# Patient Record
Sex: Female | Born: 1935 | Race: White | Hispanic: No | Marital: Married | Smoking: Never smoker
Health system: Southern US, Community
[De-identification: ages and names within clinical notes are randomized; demographics above are authoritative.]

## PROBLEM LIST (undated history)

## (undated) DIAGNOSIS — R569 Unspecified convulsions: Secondary | ICD-10-CM

## (undated) DIAGNOSIS — I495 Sick sinus syndrome: Secondary | ICD-10-CM

## (undated) DIAGNOSIS — I469 Cardiac arrest, cause unspecified: Secondary | ICD-10-CM

## (undated) DIAGNOSIS — I350 Nonrheumatic aortic (valve) stenosis: Secondary | ICD-10-CM

## (undated) DIAGNOSIS — M1990 Unspecified osteoarthritis, unspecified site: Secondary | ICD-10-CM

## (undated) DIAGNOSIS — J387 Other diseases of larynx: Secondary | ICD-10-CM

## (undated) DIAGNOSIS — J90 Pleural effusion, not elsewhere classified: Secondary | ICD-10-CM

## (undated) DIAGNOSIS — E039 Hypothyroidism, unspecified: Secondary | ICD-10-CM

## (undated) DIAGNOSIS — I2510 Atherosclerotic heart disease of native coronary artery without angina pectoris: Secondary | ICD-10-CM

## (undated) DIAGNOSIS — Z9109 Other allergy status, other than to drugs and biological substances: Secondary | ICD-10-CM

## (undated) DIAGNOSIS — T300 Burn of unspecified body region, unspecified degree: Secondary | ICD-10-CM

## (undated) DIAGNOSIS — R918 Other nonspecific abnormal finding of lung field: Secondary | ICD-10-CM

## (undated) DIAGNOSIS — K759 Inflammatory liver disease, unspecified: Secondary | ICD-10-CM

## (undated) DIAGNOSIS — Z87442 Personal history of urinary calculi: Secondary | ICD-10-CM

## (undated) DIAGNOSIS — L80 Vitiligo: Secondary | ICD-10-CM

## (undated) DIAGNOSIS — S065X9A Traumatic subdural hemorrhage with loss of consciousness of unspecified duration, initial encounter: Secondary | ICD-10-CM

## (undated) DIAGNOSIS — I10 Essential (primary) hypertension: Secondary | ICD-10-CM

## (undated) DIAGNOSIS — H3530 Unspecified macular degeneration: Secondary | ICD-10-CM

## (undated) DIAGNOSIS — T4145XA Adverse effect of unspecified anesthetic, initial encounter: Secondary | ICD-10-CM

## (undated) DIAGNOSIS — S065XAA Traumatic subdural hemorrhage with loss of consciousness status unknown, initial encounter: Secondary | ICD-10-CM

## (undated) DIAGNOSIS — T884XXA Failed or difficult intubation, initial encounter: Secondary | ICD-10-CM

## (undated) DIAGNOSIS — T8859XA Other complications of anesthesia, initial encounter: Secondary | ICD-10-CM

## (undated) DIAGNOSIS — Z972 Presence of dental prosthetic device (complete) (partial): Secondary | ICD-10-CM

## (undated) DIAGNOSIS — Z9889 Other specified postprocedural states: Secondary | ICD-10-CM

## (undated) DIAGNOSIS — I5032 Chronic diastolic (congestive) heart failure: Secondary | ICD-10-CM

## (undated) DIAGNOSIS — I4510 Unspecified right bundle-branch block: Secondary | ICD-10-CM

## (undated) DIAGNOSIS — E119 Type 2 diabetes mellitus without complications: Secondary | ICD-10-CM

## (undated) DIAGNOSIS — E7800 Pure hypercholesterolemia, unspecified: Secondary | ICD-10-CM

## (undated) DIAGNOSIS — Z952 Presence of prosthetic heart valve: Secondary | ICD-10-CM

## (undated) DIAGNOSIS — J3801 Paralysis of vocal cords and larynx, unilateral: Secondary | ICD-10-CM

## (undated) DIAGNOSIS — K754 Autoimmune hepatitis: Secondary | ICD-10-CM

## (undated) DIAGNOSIS — Z9981 Dependence on supplemental oxygen: Secondary | ICD-10-CM

## (undated) DIAGNOSIS — Z973 Presence of spectacles and contact lenses: Secondary | ICD-10-CM

## (undated) DIAGNOSIS — R011 Cardiac murmur, unspecified: Secondary | ICD-10-CM

## (undated) DIAGNOSIS — R0602 Shortness of breath: Secondary | ICD-10-CM

## (undated) DIAGNOSIS — C801 Malignant (primary) neoplasm, unspecified: Secondary | ICD-10-CM

## (undated) DIAGNOSIS — I493 Ventricular premature depolarization: Secondary | ICD-10-CM

## (undated) DIAGNOSIS — K589 Irritable bowel syndrome without diarrhea: Secondary | ICD-10-CM

## (undated) DIAGNOSIS — F419 Anxiety disorder, unspecified: Secondary | ICD-10-CM

## (undated) DIAGNOSIS — Z8719 Personal history of other diseases of the digestive system: Secondary | ICD-10-CM

## (undated) DIAGNOSIS — K219 Gastro-esophageal reflux disease without esophagitis: Secondary | ICD-10-CM

## (undated) DIAGNOSIS — J189 Pneumonia, unspecified organism: Secondary | ICD-10-CM

## (undated) HISTORY — PX: EYE SURGERY: SHX253

## (undated) HISTORY — PX: CATARACT EXTRACTION W/ INTRAOCULAR LENS  IMPLANT, BILATERAL: SHX1307

## (undated) HISTORY — PX: ELBOW BURSA SURGERY: SHX615

## (undated) HISTORY — PX: OTHER SURGICAL HISTORY: SHX169

## (undated) HISTORY — DX: Type 2 diabetes mellitus without complications: E11.9

## (undated) HISTORY — DX: Essential (primary) hypertension: I10

## (undated) HISTORY — PX: JOINT REPLACEMENT: SHX530

## (undated) HISTORY — DX: Unspecified osteoarthritis, unspecified site: M19.90

## (undated) HISTORY — DX: Other specified postprocedural states: Z98.890

## (undated) HISTORY — PX: APPENDECTOMY: SHX54

## (undated) HISTORY — DX: Paralysis of vocal cords and larynx, unilateral: J38.01

## (undated) HISTORY — DX: Personal history of other diseases of the digestive system: Z87.19

## (undated) HISTORY — PX: ABDOMINAL HYSTERECTOMY: SHX81

---
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
## 1997-12-26 ENCOUNTER — Ambulatory Visit: Admission: RE | Admit: 1997-12-26 | Discharge: 1997-12-26 | Payer: Self-pay | Admitting: Internal Medicine

## 1998-02-17 ENCOUNTER — Inpatient Hospital Stay (HOSPITAL_COMMUNITY): Admission: EM | Admit: 1998-02-17 | Discharge: 1998-02-21 | Payer: Self-pay | Admitting: Emergency Medicine

## 1998-03-01 ENCOUNTER — Encounter: Admission: RE | Admit: 1998-03-01 | Discharge: 1998-05-30 | Payer: Self-pay | Admitting: Family Medicine

## 1998-11-05 ENCOUNTER — Ambulatory Visit (HOSPITAL_COMMUNITY): Admission: RE | Admit: 1998-11-05 | Discharge: 1998-11-05 | Payer: Self-pay | Admitting: Internal Medicine

## 1999-08-12 ENCOUNTER — Encounter: Payer: Self-pay | Admitting: Family Medicine

## 1999-08-12 ENCOUNTER — Encounter: Admission: RE | Admit: 1999-08-12 | Discharge: 1999-08-12 | Payer: Self-pay | Admitting: Family Medicine

## 2000-06-14 ENCOUNTER — Encounter: Payer: Self-pay | Admitting: Family Medicine

## 2000-06-14 ENCOUNTER — Encounter: Admission: RE | Admit: 2000-06-14 | Discharge: 2000-06-14 | Payer: Self-pay | Admitting: Family Medicine

## 2000-08-12 ENCOUNTER — Encounter: Payer: Self-pay | Admitting: Family Medicine

## 2000-08-12 ENCOUNTER — Encounter: Admission: RE | Admit: 2000-08-12 | Discharge: 2000-08-12 | Payer: Self-pay | Admitting: Family Medicine

## 2000-08-25 ENCOUNTER — Ambulatory Visit (HOSPITAL_COMMUNITY): Admission: RE | Admit: 2000-08-25 | Discharge: 2000-08-25 | Payer: Self-pay | Admitting: Interventional Cardiology

## 2001-02-28 ENCOUNTER — Inpatient Hospital Stay (HOSPITAL_COMMUNITY): Admission: RE | Admit: 2001-02-28 | Discharge: 2001-03-01 | Payer: Self-pay | Admitting: Orthopedic Surgery

## 2001-02-28 ENCOUNTER — Encounter: Payer: Self-pay | Admitting: Orthopedic Surgery

## 2001-03-13 ENCOUNTER — Emergency Department (HOSPITAL_COMMUNITY): Admission: EM | Admit: 2001-03-13 | Discharge: 2001-03-13 | Payer: Self-pay | Admitting: Emergency Medicine

## 2001-03-14 ENCOUNTER — Encounter: Admission: RE | Admit: 2001-03-14 | Discharge: 2001-06-12 | Payer: Self-pay | Admitting: Orthopedic Surgery

## 2001-06-13 ENCOUNTER — Encounter: Admission: RE | Admit: 2001-06-13 | Discharge: 2001-08-30 | Payer: Self-pay | Admitting: Orthopedic Surgery

## 2001-07-07 ENCOUNTER — Ambulatory Visit (HOSPITAL_BASED_OUTPATIENT_CLINIC_OR_DEPARTMENT_OTHER): Admission: RE | Admit: 2001-07-07 | Discharge: 2001-07-07 | Payer: Self-pay | Admitting: Orthopedic Surgery

## 2001-09-06 ENCOUNTER — Ambulatory Visit (HOSPITAL_COMMUNITY): Admission: RE | Admit: 2001-09-06 | Discharge: 2001-09-06 | Payer: Self-pay | Admitting: Internal Medicine

## 2001-09-30 ENCOUNTER — Encounter: Admission: RE | Admit: 2001-09-30 | Discharge: 2001-09-30 | Payer: Self-pay | Admitting: Family Medicine

## 2001-09-30 ENCOUNTER — Encounter: Payer: Self-pay | Admitting: Family Medicine

## 2002-06-16 ENCOUNTER — Other Ambulatory Visit: Admission: RE | Admit: 2002-06-16 | Discharge: 2002-06-16 | Payer: Self-pay

## 2002-11-07 ENCOUNTER — Encounter: Admission: RE | Admit: 2002-11-07 | Discharge: 2002-11-07 | Payer: Self-pay | Admitting: Family Medicine

## 2002-11-07 ENCOUNTER — Encounter: Payer: Self-pay | Admitting: Family Medicine

## 2003-01-02 ENCOUNTER — Encounter: Admission: RE | Admit: 2003-01-02 | Discharge: 2003-04-02 | Payer: Self-pay | Admitting: Family Medicine

## 2003-04-13 ENCOUNTER — Emergency Department (HOSPITAL_COMMUNITY): Admission: EM | Admit: 2003-04-13 | Discharge: 2003-04-13 | Payer: Self-pay | Admitting: *Deleted

## 2003-04-13 ENCOUNTER — Encounter: Payer: Self-pay | Admitting: Emergency Medicine

## 2004-02-13 ENCOUNTER — Encounter: Admission: RE | Admit: 2004-02-13 | Discharge: 2004-02-13 | Payer: Self-pay | Admitting: Family Medicine

## 2004-06-02 ENCOUNTER — Encounter: Admission: RE | Admit: 2004-06-02 | Discharge: 2004-06-02 | Payer: Self-pay | Admitting: Family Medicine

## 2004-06-09 ENCOUNTER — Encounter: Admission: RE | Admit: 2004-06-09 | Discharge: 2004-06-09 | Payer: Self-pay | Admitting: Family Medicine

## 2004-07-29 ENCOUNTER — Encounter: Admission: RE | Admit: 2004-07-29 | Discharge: 2004-07-29 | Payer: Self-pay | Admitting: Otolaryngology

## 2004-09-01 ENCOUNTER — Encounter: Admission: RE | Admit: 2004-09-01 | Discharge: 2004-09-01 | Payer: Self-pay | Admitting: Otolaryngology

## 2004-09-02 ENCOUNTER — Encounter: Admission: RE | Admit: 2004-09-02 | Discharge: 2004-09-02 | Payer: Self-pay | Admitting: Orthopedic Surgery

## 2005-03-02 ENCOUNTER — Encounter: Admission: RE | Admit: 2005-03-02 | Discharge: 2005-03-02 | Payer: Self-pay | Admitting: Family Medicine

## 2005-05-07 ENCOUNTER — Encounter: Admission: RE | Admit: 2005-05-07 | Discharge: 2005-05-07 | Payer: Self-pay | Admitting: Family Medicine

## 2005-07-16 ENCOUNTER — Encounter: Admission: RE | Admit: 2005-07-16 | Discharge: 2005-07-16 | Payer: Self-pay | Admitting: Sports Medicine

## 2005-10-14 ENCOUNTER — Ambulatory Visit: Payer: Self-pay | Admitting: Internal Medicine

## 2005-11-02 ENCOUNTER — Encounter (INDEPENDENT_AMBULATORY_CARE_PROVIDER_SITE_OTHER): Payer: Self-pay | Admitting: *Deleted

## 2005-11-02 ENCOUNTER — Ambulatory Visit (HOSPITAL_COMMUNITY): Admission: RE | Admit: 2005-11-02 | Discharge: 2005-11-02 | Payer: Self-pay | Admitting: Cardiology

## 2005-11-04 ENCOUNTER — Ambulatory Visit: Payer: Self-pay | Admitting: Internal Medicine

## 2005-11-04 ENCOUNTER — Ambulatory Visit (HOSPITAL_COMMUNITY): Admission: RE | Admit: 2005-11-04 | Discharge: 2005-11-04 | Payer: Self-pay | Admitting: Internal Medicine

## 2005-11-19 ENCOUNTER — Emergency Department (HOSPITAL_COMMUNITY): Admission: EM | Admit: 2005-11-19 | Discharge: 2005-11-19 | Payer: Self-pay | Admitting: Emergency Medicine

## 2005-12-18 ENCOUNTER — Ambulatory Visit: Payer: Self-pay | Admitting: Internal Medicine

## 2005-12-21 ENCOUNTER — Ambulatory Visit: Payer: Self-pay | Admitting: Internal Medicine

## 2006-03-18 ENCOUNTER — Encounter: Admission: RE | Admit: 2006-03-18 | Discharge: 2006-03-18 | Payer: Self-pay | Admitting: Family Medicine

## 2006-09-14 ENCOUNTER — Ambulatory Visit: Payer: Self-pay | Admitting: Internal Medicine

## 2006-09-27 ENCOUNTER — Encounter (INDEPENDENT_AMBULATORY_CARE_PROVIDER_SITE_OTHER): Payer: Self-pay | Admitting: *Deleted

## 2006-09-27 ENCOUNTER — Ambulatory Visit: Payer: Self-pay | Admitting: Internal Medicine

## 2006-09-29 ENCOUNTER — Ambulatory Visit (HOSPITAL_COMMUNITY): Admission: RE | Admit: 2006-09-29 | Discharge: 2006-09-29 | Payer: Self-pay | Admitting: Internal Medicine

## 2007-03-23 ENCOUNTER — Encounter: Admission: RE | Admit: 2007-03-23 | Discharge: 2007-03-23 | Payer: Self-pay | Admitting: Family Medicine

## 2007-07-11 ENCOUNTER — Encounter: Admission: RE | Admit: 2007-07-11 | Discharge: 2007-07-11 | Payer: Self-pay | Admitting: Family Medicine

## 2007-07-22 ENCOUNTER — Encounter: Admission: RE | Admit: 2007-07-22 | Discharge: 2007-07-22 | Payer: Self-pay | Admitting: Family Medicine

## 2007-08-01 ENCOUNTER — Emergency Department (HOSPITAL_COMMUNITY): Admission: EM | Admit: 2007-08-01 | Discharge: 2007-08-01 | Payer: Self-pay | Admitting: Emergency Medicine

## 2008-03-23 ENCOUNTER — Encounter: Admission: RE | Admit: 2008-03-23 | Discharge: 2008-03-23 | Payer: Self-pay | Admitting: Family Medicine

## 2008-03-27 ENCOUNTER — Encounter: Admission: RE | Admit: 2008-03-27 | Discharge: 2008-03-27 | Payer: Self-pay | Admitting: Internal Medicine

## 2008-04-11 ENCOUNTER — Other Ambulatory Visit: Admission: RE | Admit: 2008-04-11 | Discharge: 2008-04-11 | Payer: Self-pay | Admitting: Interventional Radiology

## 2008-04-11 ENCOUNTER — Encounter (INDEPENDENT_AMBULATORY_CARE_PROVIDER_SITE_OTHER): Payer: Self-pay | Admitting: Interventional Radiology

## 2008-04-11 ENCOUNTER — Encounter: Admission: RE | Admit: 2008-04-11 | Discharge: 2008-04-11 | Payer: Self-pay | Admitting: Internal Medicine

## 2008-05-14 ENCOUNTER — Ambulatory Visit (HOSPITAL_COMMUNITY): Admission: RE | Admit: 2008-05-14 | Discharge: 2008-05-14 | Payer: Self-pay | Admitting: Sports Medicine

## 2008-06-18 ENCOUNTER — Encounter (HOSPITAL_COMMUNITY): Admission: RE | Admit: 2008-06-18 | Discharge: 2008-08-13 | Payer: Self-pay | Admitting: Internal Medicine

## 2008-09-25 ENCOUNTER — Encounter: Admission: RE | Admit: 2008-09-25 | Discharge: 2008-09-25 | Payer: Self-pay | Admitting: Internal Medicine

## 2008-10-03 ENCOUNTER — Encounter: Admission: RE | Admit: 2008-10-03 | Discharge: 2008-10-03 | Payer: Self-pay | Admitting: Orthopedic Surgery

## 2008-10-04 ENCOUNTER — Ambulatory Visit (HOSPITAL_BASED_OUTPATIENT_CLINIC_OR_DEPARTMENT_OTHER): Admission: RE | Admit: 2008-10-04 | Discharge: 2008-10-04 | Payer: Self-pay | Admitting: Orthopedic Surgery

## 2008-10-04 ENCOUNTER — Encounter (INDEPENDENT_AMBULATORY_CARE_PROVIDER_SITE_OTHER): Payer: Self-pay | Admitting: Orthopedic Surgery

## 2008-10-11 ENCOUNTER — Encounter: Admission: RE | Admit: 2008-10-11 | Discharge: 2008-10-11 | Payer: Self-pay | Admitting: Family Medicine

## 2008-11-15 HISTORY — PX: THYROIDECTOMY: SHX17

## 2008-12-10 ENCOUNTER — Encounter (INDEPENDENT_AMBULATORY_CARE_PROVIDER_SITE_OTHER): Payer: Self-pay | Admitting: Surgery

## 2008-12-10 ENCOUNTER — Ambulatory Visit (HOSPITAL_COMMUNITY): Admission: RE | Admit: 2008-12-10 | Discharge: 2008-12-11 | Payer: Self-pay | Admitting: Surgery

## 2009-03-27 ENCOUNTER — Encounter: Admission: RE | Admit: 2009-03-27 | Discharge: 2009-03-27 | Payer: Self-pay | Admitting: Family Medicine

## 2009-05-02 ENCOUNTER — Emergency Department (HOSPITAL_COMMUNITY): Admission: EM | Admit: 2009-05-02 | Discharge: 2009-05-02 | Payer: Self-pay | Admitting: Emergency Medicine

## 2009-08-17 HISTORY — PX: LARYNGOPLASTY: SHX282

## 2009-12-16 ENCOUNTER — Telehealth: Payer: Self-pay | Admitting: Internal Medicine

## 2010-04-01 ENCOUNTER — Encounter: Admission: RE | Admit: 2010-04-01 | Discharge: 2010-04-01 | Payer: Self-pay | Admitting: Family Medicine

## 2010-09-07 ENCOUNTER — Encounter: Payer: Self-pay | Admitting: Family Medicine

## 2010-09-16 NOTE — Progress Notes (Signed)
Summary: Schedule EGD  Phone Note Outgoing Call Call back at [facility] Phone [phone]   Call placed by: Harlow Mares CMA Duncan Dull),  Dec 16, 2009 12:03 PM Call placed to: Patient Summary of Call: advised pt she needs an EGD for her Barretts she said she was going to a specialist for that now and she will talk with them. I asked if she was getting her procedures from them she said she was, so she changed care.  Initial call taken by: Harlow Mares CMA (AAMA),  Dec 16, 2009 12:05 PM

## 2010-11-26 LAB — URINALYSIS, ROUTINE W REFLEX MICROSCOPIC
Bilirubin Urine: NEGATIVE
Glucose, UA: NEGATIVE mg/dL
Hgb urine dipstick: NEGATIVE
Ketones, ur: NEGATIVE mg/dL
Nitrite: NEGATIVE
Protein, ur: NEGATIVE mg/dL
Specific Gravity, Urine: 1.005 (ref 1.005–1.030)
Urobilinogen, UA: 0.2 mg/dL (ref 0.0–1.0)
pH: 6 (ref 5.0–8.0)

## 2010-11-26 LAB — CBC
HCT: 39.6 % (ref 36.0–46.0)
Hemoglobin: 13.3 g/dL (ref 12.0–15.0)
MCHC: 33.5 g/dL (ref 30.0–36.0)
MCV: 88.4 fL (ref 78.0–100.0)
Platelets: 147 10*3/uL — ABNORMAL LOW (ref 150–400)
RBC: 4.48 MIL/uL (ref 3.87–5.11)
RDW: 14.7 % (ref 11.5–15.5)
WBC: 3.9 10*3/uL — ABNORMAL LOW (ref 4.0–10.5)

## 2010-11-26 LAB — GLUCOSE, CAPILLARY
Glucose-Capillary: 142 mg/dL — ABNORMAL HIGH (ref 70–99)
Glucose-Capillary: 150 mg/dL — ABNORMAL HIGH (ref 70–99)
Glucose-Capillary: 167 mg/dL — ABNORMAL HIGH (ref 70–99)
Glucose-Capillary: 176 mg/dL — ABNORMAL HIGH (ref 70–99)
Glucose-Capillary: 177 mg/dL — ABNORMAL HIGH (ref 70–99)
Glucose-Capillary: 182 mg/dL — ABNORMAL HIGH (ref 70–99)
Glucose-Capillary: 184 mg/dL — ABNORMAL HIGH (ref 70–99)
Glucose-Capillary: 201 mg/dL — ABNORMAL HIGH (ref 70–99)

## 2010-11-26 LAB — PROTIME-INR
INR: 1 (ref 0.00–1.49)
Prothrombin Time: 13.5 seconds (ref 11.6–15.2)

## 2010-11-26 LAB — BASIC METABOLIC PANEL
BUN: 13 mg/dL (ref 6–23)
CO2: 29 mEq/L (ref 19–32)
Calcium: 10.5 mg/dL (ref 8.4–10.5)
Chloride: 99 mEq/L (ref 96–112)
Creatinine, Ser: 0.6 mg/dL (ref 0.4–1.2)
GFR calc Af Amer: >60 mL/min (ref >60)
GFR calc non Af Amer: >60 mL/min (ref >60)
Glucose, Bld: 133 mg/dL — ABNORMAL HIGH (ref 70–99)
Potassium: 4.7 mEq/L (ref 3.5–5.1)
Sodium: 136 mEq/L (ref 135–145)

## 2010-11-26 LAB — DIFFERENTIAL
Basophils Absolute: 0 10*3/uL (ref 0.0–0.1)
Basophils Relative: 1 % (ref 0–1)
Eosinophils Absolute: 0.1 10*3/uL (ref 0.0–0.7)
Eosinophils Relative: 2 % (ref 0–5)
Lymphocytes Relative: 27 % (ref 12–46)
Lymphs Abs: 1.1 10*3/uL (ref 0.7–4.0)
Monocytes Absolute: 0.4 10*3/uL (ref 0.1–1.0)
Monocytes Relative: 11 % (ref 3–12)
Neutro Abs: 2.3 10*3/uL (ref 1.7–7.7)
Neutrophils Relative %: 59 % (ref 43–77)

## 2010-11-26 LAB — CALCIUM
Calcium: 9.4 mg/dL (ref 8.4–10.5)
Calcium: 9.4 mg/dL (ref 8.4–10.5)

## 2010-12-02 LAB — GLUCOSE, CAPILLARY
Glucose-Capillary: 152 mg/dL — ABNORMAL HIGH (ref 70–99)
Glucose-Capillary: 243 mg/dL — ABNORMAL HIGH (ref 70–99)

## 2010-12-02 LAB — BASIC METABOLIC PANEL
BUN: 11 mg/dL (ref 6–23)
CO2: 28 mEq/L (ref 19–32)
Calcium: 10 mg/dL (ref 8.4–10.5)
Chloride: 100 mEq/L (ref 96–112)
Creatinine, Ser: 0.61 mg/dL (ref 0.4–1.2)
GFR calc Af Amer: >60 mL/min (ref >60)
GFR calc non Af Amer: >60 mL/min (ref >60)
Glucose, Bld: 123 mg/dL — ABNORMAL HIGH (ref 70–99)
Potassium: 4.1 mEq/L (ref 3.5–5.1)
Sodium: 136 mEq/L (ref 135–145)

## 2010-12-02 LAB — POCT HEMOGLOBIN-HEMACUE: Hemoglobin: 12.6 g/dL (ref 12.0–15.0)

## 2010-12-30 NOTE — Op Note (Signed)
Hailey Cobb, [name] NO.:  [id number]   MEDICAL RECORD NO.:  [id number]          PATIENT TYPE:  AMB   LOCATION:  DAY                          FACILITY:  [facility]   PHYSICIAN:  Velora Heckler, MD      DATE OF BIRTH:  27-Feb-1932   DATE OF PROCEDURE:  12/10/2008  DATE OF DISCHARGE:                               OPERATIVE REPORT   PREOPERATIVE DIAGNOSIS:  Bilateral thyroid nodules.   POSTOPERATIVE DIAGNOSIS:  Bilateral thyroid nodules.   PROCEDURE:  Total thyroidectomy.   SURGEON:  Velora Heckler, M.D., FACS   ASSISTANT:  Consuello Bossier, M.D., FACS   ANESTHESIA:  General per Dr. Brayton Caves.   ESTIMATED BLOOD LOSS:  Minimal.   PREPARATION:  ChloraPrep.   COMPLICATIONS:  None.   INDICATIONS:  The patient is a 77 year old white female from [place], [place].  She has been followed for bilateral thyroid  nodules by Dr. Talmage Cobb at [facility].  She has a 7-mm nodule in the  inferior left lobe with irregular margins and microcalcifications.  Cytopathology has been benign.  The patient was tried on thyroid hormone  suppression which was poorly tolerated.  She is now referred for  consideration of thyroidectomy.   BODY OF REPORT:  Procedure was done in OR #11 at the [facility].  The patient was brought to the operating room,  placed in a supine position on the operating room table.  Following  administration of general anesthesia, the patient is positioned and then  prepped and draped in the usual strict aseptic fashion.  After  ascertaining that an adequate level of anesthesia had been achieved, a  Kocher incision was made with a #15 blade.  Dissection was carried  through subcutaneous tissues and platysma.  Hemostasis was obtained with  the electrocautery.  Skin flaps were elevated cephalad and caudad from  the thyroid notch to the sternal notch.  A Mahorner self-retaining  retractor was placed for exposure.  Strap  muscles were incised in the  midline.  External jugular veins were divided between hemostats and  ligated with 2-0 silk ties.  Dissection was carried down between the  strap muscles.  Strap muscles were elevated and reflected initially to  the left.  The left thyroid lobe was minimally enlarged.  It does have  palpable subcentimeter nodules within the parenchyma.  There is a  separate nodule at the inferior pole which measures approximately 1.5 cm  in size.  With gentle dissection this was mobilized.  Inferior venous  tributaries were divided between Ligaclips with the Harmonic scalpel.  Both the superior and inferior parathyroid glands were identified and  preserved.  Branches of the inferior thyroid artery were divided between  small Ligaclips using the Harmonic scalpel.  Superior pole was  mobilized.  Superior pole vessels were divided between medium Ligaclips  with the Harmonic scalpel.  The gland was rolled anteriorly.  Ligament  of Allyson Sabal is transected with the electrocautery and the gland was  mobilized up and onto the anterior airway.  There is a small pyramidal  lobe which was dissected out with electrocautery and resected en bloc  with the thyroid.  Isthmus was mobilized across the midline.  Dry pack  is placed in the left neck.   Next, we turned our attention to the right thyroid lobe.  Again, strap  muscles were reflected laterally.  Right thyroid lobe was normal size  but does contain subcentimeter nodules which are palpable.  Middle  thyroid vein is divided between Ligaclips with the Harmonic scalpel.  Superior pole vessels were taken down and divided between medium  Ligaclips with the Harmonic scalpel.  Gland is rolled anteriorly.  Inferior venous tributaries were divided between Ligaclips with the  Harmonic scalpel.  Branches of the inferior thyroid artery are divided  between small Ligaclips with the Harmonic scalpel.  Recurrent nerve was  identified and preserved.   Parathyroid tissue was identified and  preserved.  Ligament of Allyson Sabal was transected with the electrocautery and  the gland is excised off the anterior trachea.  The entire thyroid gland  is removed.  A suture was used to mark the left superior pole.  The  entire specimen is submitted to pathology for review.   Neck was irrigated with warm saline.  Good hemostasis was noted  bilaterally.  Surgicel was placed bilaterally in the operative field.  Strap muscles were reapproximated in the midline with interrupted 3-0  Vicryl sutures.  Platysma was closed with interrupted 3-0 Vicryl  sutures.  Skin was closed with a running 4-0 Monocryl subcuticular  suture.  Wound is washed and dried and Benzoin and Steri-Strips were  applied.  Sterile dressings were applied.  The patient is awakened from  anesthesia and brought to the recovery room in stable condition.  The  patient tolerated the procedure well.      Velora Heckler, MD  Electronically Signed     TMG/MEDQ  D:  12/10/2008  T:  12/10/2008  Job:  [id number]   cc:   Tonita Cong, M.D.   Anna Genre Little, M.D.  Fax: [phone]

## 2010-12-30 NOTE — Op Note (Signed)
[name], Hailey Cobb NO.:  [id number]   MEDICAL RECORD NO.:  [id number]          PATIENT TYPE:  AMB   LOCATION:  DSC                          FACILITY:  [facility]   PHYSICIAN:  Loreta Ave, M.D. DATE OF BIRTH:  01/26/1932   DATE OF PROCEDURE:  DATE OF DISCHARGE:                               OPERATIVE REPORT   PREOPERATIVE DIAGNOSIS:  Chronic painful calcific olecranon bursitis,  left elbow.   POSTOPERATIVE DIAGNOSIS:  Chronic painful calcific olecranon bursitis,  left elbow.   PROCEDURE:  Excision olecranon bursa left.   SURGEON:  Loreta Ave, MD   ASSISTANT:  Genene Churn. Barry Dienes, [place]   ANESTHESIA:  General.   BLOOD LOSS:  Minimal.   SPECIMENS:  None.   CULTURES:  None.   COMPLICATIONS:  None.   PROCEDURE:  Soft compressive.   TOURNIQUET TIME:  40 minutes.   NOTE:  The excised bursa was sent for crystal exam to see if this was  gout versus calcific crystals.   PROCEDURE:  The patient was brought to the operating room, placed on  operating table in supine position.  After adequate anesthesia had been  obtained, tourniquet applied in the upper aspect of the left arm.  Prepped and draped in the usual sterile fashion.  Exsanguinated with  elevation and Esmarch, tourniquet inflated to 250 mmHg.  Longitudinal  incision curving around the tip of the elbow.  Skin and subcutaneous  tissue were divided.  The chronic calcific olecranon bursa excised in  its entirety.  Relatively large.  Partially adherent to the skin below.  This was completely cleared out, all debris cleared out.  The bursa was  opened and looked more like tophaceous gout than  calcific material.  Nothing else to suggest infection.  Wound irrigated.  Skin margins were debrided for a good closure.  It was then closed with  Vicryl and nylon.  Sterile compressive dressing applied.  Tourniquet was  deflated and removed.  Anesthesia reversed.  Brought to the recovery  room.  Tolerated the  surgery well.  No complications.      Loreta Ave, M.D.  Electronically Signed     DFM/MEDQ  D:  10/04/2008  T:  10/04/2008  Job:  [id number]

## 2011-01-02 NOTE — Procedures (Signed)
[facility]  Patient:    Hailey Cobb, Hailey Cobb Visit Number: [id number] MRN: [id number]          Service Type: END Location: ENDO Attending Physician:  Mervin Hack Dictated by:   Hedwig Morton. Juanda Chance, M.D. [facility] Admit Date:  09/06/2001   CC:         Caryn Bee L. Little, M.D.                           Procedure Report  PROCEDURE:  Colonoscopy.  SURGEON:  Hedwig Morton. Juanda Chance, M.D.  INDICATIONS:  This is a 77 year old white female who has a positive family history of colon cancer in her mother, and a colon polyp in one sister and one brother.  She has never had a colonoscopy.  She is otherwise asymptomatic from the GI standpoint.  She is undergoing colonoscopy for neoplastic screening.  ENDOSCOPE:  Olympus single channel videoscope.  SEDATION:  Versed 7.5 mg IV and Demerol 100 mg IV.  FINDINGS:  The Olympus single channel videoscope was passed under direct vision from the rectum to the sigmoid colon.  The patient was monitored by pulse oximeter, and oxygen saturations were normal.  The anal canal and the rectal ampulla were unremarkable.  Retroflexion of the colonoscope in the rectum revealed no evidence of internal hemorrhoids.  The sigmoid colon mucosa was unremarkable.  There were no diverticula.  The descending colon, splenic flexure, transverse colon, and the hepatic flexure was unremarkable.  Normal ascending colon and the cecum.  The cecal pouch and ileocecal valve were viewed thoroughly and showed normal-appearing mucosa.  The colonoscope was then retracted and the colon decompressed.  The patient tolerated the procedure well.  IMPRESSION:  Normal colonoscopy to the cecum.  No evidence for polyps.  PLAN: 1. High fiber diet. 2. Repeat colonoscopy in five years. 3. Yearly Hemoccult cards by Dr. Clarene Duke. Dictated by:   Hedwig Morton. Juanda Chance, M.D. [facility] Attending Physician:  Mervin Hack DD:  09/06/01 TD:  09/07/01 Job: [id number]

## 2011-01-02 NOTE — H&P (Signed)
[place]  Patient:    Hailey Cobb, Hailey Cobb                      MRN: [id number] Adm. Date:  08/25/00 Attending:  Celso Sickle, M.D. Dictator:   Anselm Lis, N.P. CC:         Anna Genre. Little, M.D.                         History and Physical  DATE OF BIRTH:  05/24/1932  HISTORY OF PRESENT ILLNESS:  The patient is a very pleasant 77 year old diabetic female with a history of hypertension, dyslipidemia, and obesity, who has had the complaint of mid-substernal chest discomfort, seemingly exacerbated with stress and exertion over the last two months.  Follow-up evaluation with an adenosine Cardiolyte was suspicious for apical, inferolateral, and distal anterior wall ischemia.  The findings were consistent with multivessel coronary artery disease.  She was without regional wall motion abnormality with an ejection fraction of 78%.  Interestingly her chest discomfort resolved approximately five days ago, without recurrence.  CARDIAC RISK FACTORS:  Age, hypertension, dyslipidemia, and obesity.  PAST MEDICAL HISTORY: 1. Diabetes mellitus type 2 for the last four or five years. 2. Past history of elevated liver function tests, recent LFTs okay.    Question if this has been related in the past to medication. 3. Hypertension for many years. 4. History of dilatation of esophageal strictures one year earlier by    Dr. Hedwig Morton. [place].  The patient has noted residual soft/worse voice    since that procedure. 5. Dyslipidemia.  PAST SURGICAL HISTORY: 1. Hysterectomy without BSO, secondary to fibroid tumors. 2. History of bilateral knee arthroscopic surgeries, and left TKA. 3. She has had a right cataract excision with lens implant.  She denies a history of depression, cancer, or asthma.  ALLERGIES:  ACE INHIBITOR, causing cough, and to SULFA, causing a rash.  CURRENT MEDICATIONS:  1. Glucophage 1000 mg p.o. b.i.d.  2. Glucotrol XL 2.5 mg p.o.  q.a.m.  3. Welchol 625 mg, seven tablets q.d.  4. Norvasc 10 mg q.d.  5. Toprol 50 mg p.o. b.i.d.  6. Avapro 150 mg p.o. q.d.  7. Enteric-coated aspirin 81 mg p.o. q.d.  8. Premarin 0.625 mg p.o. q.d.  9. Xanax 0.5 mg p.o. q.d. 10. Metamucil once q.d. 11. Xanax 0.5 mg p.o. q.d. 12. Metamucil once q.d.  SOCIAL HISTORY/HABITS:  The patient is married for 45 years.  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.  She has one son and two daughters without CAD.  ETOH:  Negative.  Tobacco:  Negative.  FAMILY HISTORY:  Father died at age 58 after being run over by a train. Mother died at age 31 of gastric cancer.  The patient has three sisters and two brothers.  One sister died of multiple sclerosis.  One sister died subsequent to complications after a motor vehicle accident.  One brother died of a stroke.  REVIEW OF SYSTEMS:  As in the HPI and past medical history.  Otherwise wears glasses for reading.  Has problems with feeling like she is choking if she reclines back.  Denies dysphagia.  Denies diarrhea.  Problems with constipation, resolved if she takes daily Metamucil.  Negative melena or bright red blood per rectum.  Negative dysuria or hematuria.  No symptoms of GERD.  Denies orthopnea, palpitations, PND, or symptoms  of claudication.  PHYSICAL EXAMINATION:  VITAL SIGNS:  Blood pressure 138/80, heart rate 68 and regular, respirations 18, temperature 97 degrees.  She is 5 feet 6 inches, weighing 230 pounds.  GENERAL:  She is a well-nourished obese female, in no apparent distress.  Many family members are in attendance.  HEENT:  Brisk bilateral carotid upstroke without bruit.  No significant jugular venous distention.  No thyromegaly.  CHEST:  Lungs sounds are clear after cough.  CARDIAC:  A regular rate and rhythm without murmur, rub, or gallop appreciated.  Normal S1, S2.  ABDOMEN:  Obese, soft, nondistended.  Normoactive bowel sounds.  Negative abdominal aorta,  renal, or femoral bruits.  EXTREMITIES:  With +2/4 bilateral radial, femoral, and dorsalis pedis and posterior tibial pulses.  Negative pedal edema.  NEUROLOGIC:  Cranial nerves II-XII grossly intact.  Alert and oriented x 3.  GENITOURINARY:  Deferred.  RECTAL:  Deferred.  LABORATORY DATA:  From August 20, 2000, revealed a hemoglobin of 13.9, hematocrit 40.5, WBC 5.2, platelets 215.  PT of 12.2, with INR of 1.10, PTT 28.  Sodium 135, potassium elevated at 5.9, chloride 98, CO2 of 27, BUN 12, creatinine 0.9, and glucose of 90.  Repeat i-STAT today reveals a K normalized at 4.2 and glucose 131.  Chest x-ray was negative for active disease.  Electrocardiogram revealed a normal sinus rhythm with T-wave abnormalities in lead III.  Nonischemic.  IMPRESSION: 1. Atypical chest discomfort in this 77 year old obese diabetic female,    with a history of dyslipidemia and hypertension.  A recent adenosine    Cardiolyte was suspicious for apical, inferolateral, and distal anterior    wall ischemia.  No regional wall motion abnormality, however, with an    ejection fraction of 78%. 2. Diabetes mellitus, under good control on current medications. 3. History of hypertension, under good control on current medical regimen. 4. Dyslipidemia, on Welchol management by primary care.  PLAN:  A coronary angiography with possible percutaneous intervention if indicated and able.  The risks, potential complications, benefits, and alternatives of the procedure were discussed in detail.  The patient and family members indicated that their questions and concerns have been addressed, and are agreeable to proceed.  Will hold Glucophage post-cardiac catheterization for at least two days.  The patient can resume on August 28, 2000, on Saturday. DD:  08/25/00 TD:  08/25/00 Job: [id number]

## 2011-01-02 NOTE — Op Note (Signed)
[place]. [facility]  Patient:    Hailey Cobb, Hailey Cobb Visit Number: [id number] MRN: [id number]          Service Type: EMS Location: [facility] Attending Physician:  Cathren Laine Dictated by:   Loreta Ave, M.D. Proc. Date: 07/07/01 Admit Date:  03/13/2001 Discharge Date: 03/13/2001                             Operative Report  PREOPERATIVE DIAGNOSIS:  Arthrofibrosis, left shoulder, status post left total shoulder replacement.  POSTOPERATIVE DIAGNOSIS:  Arthrofibrosis, left shoulder, status post left total shoulder replacement.  PROCEDURE:  Left shoulder manipulation and intra-articular injection with Marcaine and cortisone.  SURGEON:  Loreta Ave, M.D.  ASSISTANT:  Arlys John D. Petrarca, P.A.-C.  ANESTHESIA:  General.  DESCRIPTION OF PROCEDURE:  Patient brought to the operating room and after adequate anesthesia had been obtained, both shoulders examined.  Significantly reduced glenohumeral motion of left shoulder from adhesions and capsular tightness.  Manipulated with breaking up of some adhesions but mostly manipulated stretching out capsular tightness and contracture.  Full motion achieved without producing instability.  No adverse occurrence.  At completion, very good, potentially full glenohumeral motion with capsular stretching achieved.  Under sterile technique, injected intra-articularly with cortisone and Marcaine.  Anesthesia reversed.  Brought to recovery room. Tolerated the surgery well with no complications. Dictated by:   Loreta Ave, M.D. Attending Physician:  Cathren Laine DD:  07/07/01 TD:  07/07/01 Job: [id number]

## 2011-01-02 NOTE — Op Note (Signed)
[place]. [facility]  Patient:    Hailey Cobb, Hailey Cobb                       MRN: [id number] Proc. Date: 02/28/01 Adm. 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  OPERATIVE PROCEDURE:  Total shoulder replacement with appropriate soft tissue release, left shoulder, Osteonics prosthesis, cemented nonmetal back #7 pegged glenoid component, Press-Fit humeral component, size #11 with a 45 mm x 18 mm unipolar head.  SURGEON:  Loreta Ave, M.D.  ASSISTANT:  Arlys John D. Petrarca, P.A.-C.  ANESTHESIA:  General.  ESTIMATED BLOOD LOSS:  150 cc.  SPECIMENS:  Excised bone and soft tissue.  CULTURES:  None.  COMPLICATIONS:  None.  DRESSING:  Self-compressive with shoulder immobilizer.  DESCRIPTION OF PROCEDURE:  The patient was brought to the operating room and placed on the operating table in the supine position.  After adequate anesthesia had been obtained, the left shoulder examined.  Markedly restricted motion, more than 50% in all planes.  Marked crepitus intra-articularly. Manipulated and achieving reasonable, but not quite full motion.  Placed in beach chair position on the shoulder positioner, prepped and draped in the usual sterile fashion.  The anterior incision along the deltopectoral interval.  The skin and subcutaneous tissue divided.  Hemostasis was obtained with cautery.  Deltopectoral interval opened.  Extensive extra-articular adhesions taken down to mobilize and allow exposure.  Conjoined tendon was elevated and retractors put in place, protecting neurovascular structures throughout.  The rotator cuff was thin, but intact.  The subscapularis was taken down, tagged with #2 Ethibond,  and retracted medially.  The shoulder itself was exposed with removal of extensive loose bodies, periarticular spurs, and adhesions.  The humeral head was cut off in line with the definitive prosthesis, protecting biceps tendon and rotator cuff.  This was excised and the hip was utilized to size for the replacement prosthesis. Glenoid exposed.  Reamed down to bleeding bone throughout.  Sized for a #7 component.  Drill holes made with appropriate templates.  Once I had a good bed for the glenoid component which was placed in the normal anteverted position of the glenoid, cement was prepared.  The component was cemented down under pressure.  Excessive cement removed.  Once this was solid, retractors were removed and attention was turned to the humerus.  Hand held reamers up to 11 mm distally with contact throughout.  Proximal reamer utilized as well for the #11 component.  Trials put in place with a 45 x 18 mm head which gave good restoration of length and position.  This was below the level of the tuberosity so it would no impinge.  The humeral cut had been made with 30 degrees of retroversion to allow for good stability of the shoulder.  After I had good fitting with the trial, the trials were removed.  The wound copiously irrigated.  Definitive prosthesis was assembled and hammered down into the humerus, seating well.  The head was attached and the shoulder reduced.  Good motion and good stability throughout.  The wound irrigated. The subscapularis was repaired anatomically with Ethibond.  Retractors removed, closing the deltopectoral interval.  Subcutaneous and subcuticular closure with Vicryl and Steri-Strips.  Margins of the wound injected with Marcaine.  A sterile compressive dressing applied.  A shoulder immobilizer applied.  Anesthesia reversed and brought to the recovery room.  Tolerated surgery well with no complications. DD:  02/28/01 TD:  02/28/01 Job: [id number]

## 2011-01-02 NOTE — Consult Note (Signed)
NAMEAANSHI, BATCHELDER NO.:  [id number]   MEDICAL RECORD NO.:  [id number]          PATIENT TYPE:  EMS   LOCATION:  [facility]                         FACILITY:  [facility]   PHYSICIAN:  Corky Crafts, MDDATE OF BIRTH:  June 14, 1932   DATE OF CONSULTATION:  11/19/2005  DATE OF DISCHARGE:  11/19/2005                                   CONSULTATION   REFERRED BY:  Caryn Bee L. Little, M.D.   REASON FOR CONSULTATION:  Chest pain.   HISTORY OF PRESENT ILLNESS:  The patient is a 77 year old female who has  been having constant lower chest pain and epigastric tenderness over the  past week. She has had chest pains in the past. She had a stress test and is  subsequently cardiac catheterization approximately 1 year ago which did not  show any evidence of obstructive coronary artery disease. These tests were  performed by Dr. Katrinka Blazing. The chest pain that she has now is constant and is  not related to exertion. It is somewhat worse when she touches her  epigastric area. She does state that her eating habits have been irregular,  she has had a lot of gas and acid reflux type symptoms as well over the  week. She denies alcohol use. She did not use any antiinflammatories.  Overall she has felt weak. She states that she has had days where she just  feels like lying down the whole day however, a few days ago she was able to  mow her yard without any difficulty. She states that her blood sugars have  been up and down. In the emergency room, she has received a GI cocktail  which has significantly reduced her symptoms. She is hoping to go home.   CURRENT MEDICATIONS:  1.  Lasix, metformin 1 gram b.i.d.  2.  Avandia 2 mg in the morning, 1 mg in the evening.  3.  Toprol XL 50 mg b.i.d.  4.  Avapro 150 mg b.i.d.  5.  Norvasc 10 mg daily.  6.  Doxazosin 2 mg b.i.d.  7.  Crestor 10 mg daily.  8.  Nexium 40 mg daily.  9.  Nasonex.  10. Xanax.   ALLERGIES:  ACE INHIBITORS, SULFA, and  LEVAQUIN.   PAST MEDICAL HISTORY:  1.  Hypertension.  2.  Diabetes.  3.  High cholesterol.  4.  Autoimmune hepatitis.   PAST SURGICAL HISTORY:  1.  Hysterectomy.  2.  Knee replacement.  3.  Shoulder replacement.   FAMILY HISTORY:  No early coronary artery disease.   SOCIAL HISTORY:  The patient does not function, does not drink, she does not  use illegal drugs. She does use vitamins and herbals over the counter.   REVIEW OF SYSTEMS:  Significant for diffuse joint pains, chest pain and  epigastric pains as described above and significant fatigue.   PHYSICAL EXAMINATION:  VITAL SIGNS:  132/57, pulse 64, 92% on room air.  GENERAL:  The patient is awake and alert in no apparent distress.  NECK:  No carotid bruits, no JVD.  CARDIOVASCULAR:  Regular rate and rhythm, S1,  S2. 2/6 systolic murmur.  LUNGS:  Clear to auscultation bilaterally.  ABDOMEN:  Epigastrium tender to palpation, no rebound tenderness, normal  active bowel sounds, obese.  EXTREMITIES:  No pretibial edema, 2+ posterior tibial pulses bilaterally.  NEUROLOGIC:  No focal deficits.  SKIN:  No rash.   LABORATORY DATA:  Creatinine of 0.8, hematocrit of 33.8. LFTs within normal  limits. CK of 81, MB of 1.6, troponin less than 0.01. EKG shows normal sinus  rhythm with poor R wave progression, no ST/T wave changes, no change from  prior EKG.   ASSESSMENT/PLAN:  A 77 year old who has had chest pain for days with normal  cardiac enzymes.   1.  Cardiac.  I do not think that this is cardiac chest pain given her      completely normal cardiac enzymes and no EKG changes. Given the duration      of rest pain that she has had, she should have bumped her enzymes if      this is truly cardiac. She has also had a normal cath within the last      year and a half.  2.  Her epigastric/chest pain was improved with GI cocktail. This may all be      related to reflux type symptoms. Continue her Nexium.  3.  Unclear what is causing her  weakness. This may be due to blood sugars      which are fluctuating. The patient should followup with Dr. Clarene Duke      regarding her diabetes. He may also want to check thyroid studies.  4.  The patient will call us if there is any significant change in her      symptoms or if her chest pain returns. She should come back to the      hospital if she does have any new symptoms or further chest pains which      are not relieved.      Corky Crafts, MD  Electronically Signed     JSV/MEDQ  D:  11/19/2005  T:  11/20/2005  Job:  [id number]

## 2011-01-22 ENCOUNTER — Other Ambulatory Visit: Payer: Self-pay

## 2011-04-29 ENCOUNTER — Other Ambulatory Visit: Payer: Self-pay | Admitting: Family Medicine

## 2011-04-29 DIAGNOSIS — Z1231 Encounter for screening mammogram for malignant neoplasm of breast: Secondary | ICD-10-CM

## 2011-05-01 ENCOUNTER — Ambulatory Visit
Admission: RE | Admit: 2011-05-01 | Discharge: 2011-05-01 | Disposition: A | Discharge status: Home / Self Care | Payer: Medicare Other | Source: Ambulatory Visit | Attending: Family Medicine | Admitting: Family Medicine

## 2011-05-01 DIAGNOSIS — Z1231 Encounter for screening mammogram for malignant neoplasm of breast: Secondary | ICD-10-CM

## 2011-09-03 DIAGNOSIS — K754 Autoimmune hepatitis: Secondary | ICD-10-CM | POA: Diagnosis not present

## 2011-09-03 DIAGNOSIS — Z79899 Other long term (current) drug therapy: Secondary | ICD-10-CM | POA: Diagnosis not present

## 2011-09-03 DIAGNOSIS — L930 Discoid lupus erythematosus: Secondary | ICD-10-CM | POA: Diagnosis not present

## 2011-09-03 DIAGNOSIS — IMO0001 Reserved for inherently not codable concepts without codable children: Secondary | ICD-10-CM | POA: Diagnosis not present

## 2011-09-03 DIAGNOSIS — M2550 Pain in unspecified joint: Secondary | ICD-10-CM | POA: Diagnosis not present

## 2011-09-03 DIAGNOSIS — M1990 Unspecified osteoarthritis, unspecified site: Secondary | ICD-10-CM | POA: Diagnosis not present

## 2011-09-10 DIAGNOSIS — M531 Cervicobrachial syndrome: Secondary | ICD-10-CM | POA: Diagnosis not present

## 2011-09-10 DIAGNOSIS — M9981 Other biomechanical lesions of cervical region: Secondary | ICD-10-CM | POA: Diagnosis not present

## 2011-09-10 DIAGNOSIS — M999 Biomechanical lesion, unspecified: Secondary | ICD-10-CM | POA: Diagnosis not present

## 2011-10-26 DIAGNOSIS — M531 Cervicobrachial syndrome: Secondary | ICD-10-CM | POA: Diagnosis not present

## 2011-10-26 DIAGNOSIS — M999 Biomechanical lesion, unspecified: Secondary | ICD-10-CM | POA: Diagnosis not present

## 2011-10-26 DIAGNOSIS — M9981 Other biomechanical lesions of cervical region: Secondary | ICD-10-CM | POA: Diagnosis not present

## 2011-10-28 DIAGNOSIS — M9981 Other biomechanical lesions of cervical region: Secondary | ICD-10-CM | POA: Diagnosis not present

## 2011-10-28 DIAGNOSIS — E11329 Type 2 diabetes mellitus with mild nonproliferative diabetic retinopathy without macular edema: Secondary | ICD-10-CM | POA: Diagnosis not present

## 2011-10-28 DIAGNOSIS — M999 Biomechanical lesion, unspecified: Secondary | ICD-10-CM | POA: Diagnosis not present

## 2011-10-28 DIAGNOSIS — M531 Cervicobrachial syndrome: Secondary | ICD-10-CM | POA: Diagnosis not present

## 2011-10-28 DIAGNOSIS — E11311 Type 2 diabetes mellitus with unspecified diabetic retinopathy with macular edema: Secondary | ICD-10-CM | POA: Diagnosis not present

## 2011-10-28 DIAGNOSIS — E1139 Type 2 diabetes mellitus with other diabetic ophthalmic complication: Secondary | ICD-10-CM | POA: Diagnosis not present

## 2011-10-28 DIAGNOSIS — Z79899 Other long term (current) drug therapy: Secondary | ICD-10-CM | POA: Diagnosis not present

## 2011-11-10 DIAGNOSIS — L930 Discoid lupus erythematosus: Secondary | ICD-10-CM | POA: Diagnosis not present

## 2011-11-10 DIAGNOSIS — M9981 Other biomechanical lesions of cervical region: Secondary | ICD-10-CM | POA: Diagnosis not present

## 2011-11-10 DIAGNOSIS — M531 Cervicobrachial syndrome: Secondary | ICD-10-CM | POA: Diagnosis not present

## 2011-11-10 DIAGNOSIS — M999 Biomechanical lesion, unspecified: Secondary | ICD-10-CM | POA: Diagnosis not present

## 2011-11-17 DIAGNOSIS — M999 Biomechanical lesion, unspecified: Secondary | ICD-10-CM | POA: Diagnosis not present

## 2011-11-17 DIAGNOSIS — M531 Cervicobrachial syndrome: Secondary | ICD-10-CM | POA: Diagnosis not present

## 2011-11-17 DIAGNOSIS — M9981 Other biomechanical lesions of cervical region: Secondary | ICD-10-CM | POA: Diagnosis not present

## 2011-11-24 DIAGNOSIS — E11311 Type 2 diabetes mellitus with unspecified diabetic retinopathy with macular edema: Secondary | ICD-10-CM | POA: Diagnosis not present

## 2011-11-24 DIAGNOSIS — E11329 Type 2 diabetes mellitus with mild nonproliferative diabetic retinopathy without macular edema: Secondary | ICD-10-CM | POA: Diagnosis not present

## 2011-11-24 DIAGNOSIS — E1139 Type 2 diabetes mellitus with other diabetic ophthalmic complication: Secondary | ICD-10-CM | POA: Diagnosis not present

## 2011-12-08 DIAGNOSIS — M999 Biomechanical lesion, unspecified: Secondary | ICD-10-CM | POA: Diagnosis not present

## 2011-12-08 DIAGNOSIS — M9981 Other biomechanical lesions of cervical region: Secondary | ICD-10-CM | POA: Diagnosis not present

## 2011-12-08 DIAGNOSIS — M531 Cervicobrachial syndrome: Secondary | ICD-10-CM | POA: Diagnosis not present

## 2011-12-09 DIAGNOSIS — Z79899 Other long term (current) drug therapy: Secondary | ICD-10-CM | POA: Diagnosis not present

## 2011-12-09 DIAGNOSIS — M2550 Pain in unspecified joint: Secondary | ICD-10-CM | POA: Diagnosis not present

## 2011-12-09 DIAGNOSIS — M1990 Unspecified osteoarthritis, unspecified site: Secondary | ICD-10-CM | POA: Diagnosis not present

## 2011-12-09 DIAGNOSIS — L930 Discoid lupus erythematosus: Secondary | ICD-10-CM | POA: Diagnosis not present

## 2011-12-09 DIAGNOSIS — K754 Autoimmune hepatitis: Secondary | ICD-10-CM | POA: Diagnosis not present

## 2011-12-24 DIAGNOSIS — M531 Cervicobrachial syndrome: Secondary | ICD-10-CM | POA: Diagnosis not present

## 2011-12-24 DIAGNOSIS — M999 Biomechanical lesion, unspecified: Secondary | ICD-10-CM | POA: Diagnosis not present

## 2011-12-24 DIAGNOSIS — M9981 Other biomechanical lesions of cervical region: Secondary | ICD-10-CM | POA: Diagnosis not present

## 2011-12-28 DIAGNOSIS — M531 Cervicobrachial syndrome: Secondary | ICD-10-CM | POA: Diagnosis not present

## 2011-12-28 DIAGNOSIS — M9981 Other biomechanical lesions of cervical region: Secondary | ICD-10-CM | POA: Diagnosis not present

## 2011-12-28 DIAGNOSIS — M999 Biomechanical lesion, unspecified: Secondary | ICD-10-CM | POA: Diagnosis not present

## 2011-12-30 DIAGNOSIS — M999 Biomechanical lesion, unspecified: Secondary | ICD-10-CM | POA: Diagnosis not present

## 2011-12-30 DIAGNOSIS — M531 Cervicobrachial syndrome: Secondary | ICD-10-CM | POA: Diagnosis not present

## 2011-12-30 DIAGNOSIS — M9981 Other biomechanical lesions of cervical region: Secondary | ICD-10-CM | POA: Diagnosis not present

## 2012-01-01 DIAGNOSIS — M5137 Other intervertebral disc degeneration, lumbosacral region: Secondary | ICD-10-CM | POA: Diagnosis not present

## 2012-01-01 DIAGNOSIS — M9981 Other biomechanical lesions of cervical region: Secondary | ICD-10-CM | POA: Diagnosis not present

## 2012-01-01 DIAGNOSIS — M999 Biomechanical lesion, unspecified: Secondary | ICD-10-CM | POA: Diagnosis not present

## 2012-01-01 DIAGNOSIS — M1710 Unilateral primary osteoarthritis, unspecified knee: Secondary | ICD-10-CM | POA: Diagnosis not present

## 2012-01-01 DIAGNOSIS — M531 Cervicobrachial syndrome: Secondary | ICD-10-CM | POA: Diagnosis not present

## 2012-01-04 DIAGNOSIS — M531 Cervicobrachial syndrome: Secondary | ICD-10-CM | POA: Diagnosis not present

## 2012-01-04 DIAGNOSIS — M999 Biomechanical lesion, unspecified: Secondary | ICD-10-CM | POA: Diagnosis not present

## 2012-01-04 DIAGNOSIS — M9981 Other biomechanical lesions of cervical region: Secondary | ICD-10-CM | POA: Diagnosis not present

## 2012-01-06 DIAGNOSIS — M999 Biomechanical lesion, unspecified: Secondary | ICD-10-CM | POA: Diagnosis not present

## 2012-01-06 DIAGNOSIS — M531 Cervicobrachial syndrome: Secondary | ICD-10-CM | POA: Diagnosis not present

## 2012-01-06 DIAGNOSIS — M9981 Other biomechanical lesions of cervical region: Secondary | ICD-10-CM | POA: Diagnosis not present

## 2012-01-08 DIAGNOSIS — E7800 Pure hypercholesterolemia, unspecified: Secondary | ICD-10-CM | POA: Diagnosis not present

## 2012-01-08 DIAGNOSIS — IMO0001 Reserved for inherently not codable concepts without codable children: Secondary | ICD-10-CM | POA: Diagnosis not present

## 2012-01-08 DIAGNOSIS — I10 Essential (primary) hypertension: Secondary | ICD-10-CM | POA: Diagnosis not present

## 2012-01-13 DIAGNOSIS — M999 Biomechanical lesion, unspecified: Secondary | ICD-10-CM | POA: Diagnosis not present

## 2012-01-13 DIAGNOSIS — M531 Cervicobrachial syndrome: Secondary | ICD-10-CM | POA: Diagnosis not present

## 2012-01-13 DIAGNOSIS — M9981 Other biomechanical lesions of cervical region: Secondary | ICD-10-CM | POA: Diagnosis not present

## 2012-01-15 DIAGNOSIS — M9981 Other biomechanical lesions of cervical region: Secondary | ICD-10-CM | POA: Diagnosis not present

## 2012-01-15 DIAGNOSIS — M531 Cervicobrachial syndrome: Secondary | ICD-10-CM | POA: Diagnosis not present

## 2012-01-15 DIAGNOSIS — M999 Biomechanical lesion, unspecified: Secondary | ICD-10-CM | POA: Diagnosis not present

## 2012-01-22 DIAGNOSIS — M531 Cervicobrachial syndrome: Secondary | ICD-10-CM | POA: Diagnosis not present

## 2012-01-22 DIAGNOSIS — M999 Biomechanical lesion, unspecified: Secondary | ICD-10-CM | POA: Diagnosis not present

## 2012-01-22 DIAGNOSIS — M9981 Other biomechanical lesions of cervical region: Secondary | ICD-10-CM | POA: Diagnosis not present

## 2012-02-23 DIAGNOSIS — E1139 Type 2 diabetes mellitus with other diabetic ophthalmic complication: Secondary | ICD-10-CM | POA: Diagnosis not present

## 2012-02-23 DIAGNOSIS — M9981 Other biomechanical lesions of cervical region: Secondary | ICD-10-CM | POA: Diagnosis not present

## 2012-02-23 DIAGNOSIS — E11311 Type 2 diabetes mellitus with unspecified diabetic retinopathy with macular edema: Secondary | ICD-10-CM | POA: Diagnosis not present

## 2012-02-23 DIAGNOSIS — H43819 Vitreous degeneration, unspecified eye: Secondary | ICD-10-CM | POA: Diagnosis not present

## 2012-02-23 DIAGNOSIS — M531 Cervicobrachial syndrome: Secondary | ICD-10-CM | POA: Diagnosis not present

## 2012-02-23 DIAGNOSIS — E11329 Type 2 diabetes mellitus with mild nonproliferative diabetic retinopathy without macular edema: Secondary | ICD-10-CM | POA: Diagnosis not present

## 2012-02-23 DIAGNOSIS — M999 Biomechanical lesion, unspecified: Secondary | ICD-10-CM | POA: Diagnosis not present

## 2012-03-08 DIAGNOSIS — M999 Biomechanical lesion, unspecified: Secondary | ICD-10-CM | POA: Diagnosis not present

## 2012-03-08 DIAGNOSIS — M531 Cervicobrachial syndrome: Secondary | ICD-10-CM | POA: Diagnosis not present

## 2012-03-08 DIAGNOSIS — M9981 Other biomechanical lesions of cervical region: Secondary | ICD-10-CM | POA: Diagnosis not present

## 2012-03-11 DIAGNOSIS — M9981 Other biomechanical lesions of cervical region: Secondary | ICD-10-CM | POA: Diagnosis not present

## 2012-03-11 DIAGNOSIS — M999 Biomechanical lesion, unspecified: Secondary | ICD-10-CM | POA: Diagnosis not present

## 2012-03-11 DIAGNOSIS — M531 Cervicobrachial syndrome: Secondary | ICD-10-CM | POA: Diagnosis not present

## 2012-03-28 DIAGNOSIS — M999 Biomechanical lesion, unspecified: Secondary | ICD-10-CM | POA: Diagnosis not present

## 2012-03-28 DIAGNOSIS — M531 Cervicobrachial syndrome: Secondary | ICD-10-CM | POA: Diagnosis not present

## 2012-03-28 DIAGNOSIS — M9981 Other biomechanical lesions of cervical region: Secondary | ICD-10-CM | POA: Diagnosis not present

## 2012-04-04 DIAGNOSIS — M999 Biomechanical lesion, unspecified: Secondary | ICD-10-CM | POA: Diagnosis not present

## 2012-04-04 DIAGNOSIS — M9981 Other biomechanical lesions of cervical region: Secondary | ICD-10-CM | POA: Diagnosis not present

## 2012-04-04 DIAGNOSIS — M531 Cervicobrachial syndrome: Secondary | ICD-10-CM | POA: Diagnosis not present

## 2012-04-14 DIAGNOSIS — M9981 Other biomechanical lesions of cervical region: Secondary | ICD-10-CM | POA: Diagnosis not present

## 2012-04-14 DIAGNOSIS — M999 Biomechanical lesion, unspecified: Secondary | ICD-10-CM | POA: Diagnosis not present

## 2012-04-14 DIAGNOSIS — M531 Cervicobrachial syndrome: Secondary | ICD-10-CM | POA: Diagnosis not present

## 2012-05-03 DIAGNOSIS — M999 Biomechanical lesion, unspecified: Secondary | ICD-10-CM | POA: Diagnosis not present

## 2012-05-03 DIAGNOSIS — M531 Cervicobrachial syndrome: Secondary | ICD-10-CM | POA: Diagnosis not present

## 2012-05-03 DIAGNOSIS — M9981 Other biomechanical lesions of cervical region: Secondary | ICD-10-CM | POA: Diagnosis not present

## 2012-05-04 ENCOUNTER — Other Ambulatory Visit: Payer: Self-pay | Admitting: Family Medicine

## 2012-05-04 DIAGNOSIS — Z1231 Encounter for screening mammogram for malignant neoplasm of breast: Secondary | ICD-10-CM

## 2012-05-05 DIAGNOSIS — H5710 Ocular pain, unspecified eye: Secondary | ICD-10-CM | POA: Diagnosis not present

## 2012-05-06 DIAGNOSIS — M999 Biomechanical lesion, unspecified: Secondary | ICD-10-CM | POA: Diagnosis not present

## 2012-05-06 DIAGNOSIS — M531 Cervicobrachial syndrome: Secondary | ICD-10-CM | POA: Diagnosis not present

## 2012-05-06 DIAGNOSIS — M9981 Other biomechanical lesions of cervical region: Secondary | ICD-10-CM | POA: Diagnosis not present

## 2012-05-11 DIAGNOSIS — H01029 Squamous blepharitis unspecified eye, unspecified eyelid: Secondary | ICD-10-CM | POA: Diagnosis not present

## 2012-05-13 ENCOUNTER — Ambulatory Visit: Payer: Medicare Other

## 2012-05-16 DIAGNOSIS — M9981 Other biomechanical lesions of cervical region: Secondary | ICD-10-CM | POA: Diagnosis not present

## 2012-05-16 DIAGNOSIS — M999 Biomechanical lesion, unspecified: Secondary | ICD-10-CM | POA: Diagnosis not present

## 2012-05-16 DIAGNOSIS — M531 Cervicobrachial syndrome: Secondary | ICD-10-CM | POA: Diagnosis not present

## 2012-05-23 DIAGNOSIS — M531 Cervicobrachial syndrome: Secondary | ICD-10-CM | POA: Diagnosis not present

## 2012-05-23 DIAGNOSIS — M9981 Other biomechanical lesions of cervical region: Secondary | ICD-10-CM | POA: Diagnosis not present

## 2012-05-23 DIAGNOSIS — M999 Biomechanical lesion, unspecified: Secondary | ICD-10-CM | POA: Diagnosis not present

## 2012-06-01 DIAGNOSIS — M9981 Other biomechanical lesions of cervical region: Secondary | ICD-10-CM | POA: Diagnosis not present

## 2012-06-01 DIAGNOSIS — M999 Biomechanical lesion, unspecified: Secondary | ICD-10-CM | POA: Diagnosis not present

## 2012-06-01 DIAGNOSIS — M531 Cervicobrachial syndrome: Secondary | ICD-10-CM | POA: Diagnosis not present

## 2012-06-13 DIAGNOSIS — M531 Cervicobrachial syndrome: Secondary | ICD-10-CM | POA: Diagnosis not present

## 2012-06-13 DIAGNOSIS — M9981 Other biomechanical lesions of cervical region: Secondary | ICD-10-CM | POA: Diagnosis not present

## 2012-06-13 DIAGNOSIS — M999 Biomechanical lesion, unspecified: Secondary | ICD-10-CM | POA: Diagnosis not present

## 2012-06-17 DIAGNOSIS — M531 Cervicobrachial syndrome: Secondary | ICD-10-CM | POA: Diagnosis not present

## 2012-06-17 DIAGNOSIS — M9981 Other biomechanical lesions of cervical region: Secondary | ICD-10-CM | POA: Diagnosis not present

## 2012-06-17 DIAGNOSIS — M999 Biomechanical lesion, unspecified: Secondary | ICD-10-CM | POA: Diagnosis not present

## 2012-06-22 ENCOUNTER — Ambulatory Visit
Admission: RE | Admit: 2012-06-22 | Discharge: 2012-06-22 | Disposition: A | Discharge status: Home / Self Care | Payer: Medicare Other | Source: Ambulatory Visit | Attending: Family Medicine | Admitting: Family Medicine

## 2012-06-22 DIAGNOSIS — Z79899 Other long term (current) drug therapy: Secondary | ICD-10-CM | POA: Diagnosis not present

## 2012-06-22 DIAGNOSIS — E1139 Type 2 diabetes mellitus with other diabetic ophthalmic complication: Secondary | ICD-10-CM | POA: Diagnosis not present

## 2012-06-22 DIAGNOSIS — Z1231 Encounter for screening mammogram for malignant neoplasm of breast: Secondary | ICD-10-CM

## 2012-06-22 DIAGNOSIS — E11329 Type 2 diabetes mellitus with mild nonproliferative diabetic retinopathy without macular edema: Secondary | ICD-10-CM | POA: Diagnosis not present

## 2012-06-22 DIAGNOSIS — H15839 Staphyloma posticum, unspecified eye: Secondary | ICD-10-CM | POA: Diagnosis not present

## 2012-06-28 DIAGNOSIS — M999 Biomechanical lesion, unspecified: Secondary | ICD-10-CM | POA: Diagnosis not present

## 2012-06-28 DIAGNOSIS — M9981 Other biomechanical lesions of cervical region: Secondary | ICD-10-CM | POA: Diagnosis not present

## 2012-06-28 DIAGNOSIS — M531 Cervicobrachial syndrome: Secondary | ICD-10-CM | POA: Diagnosis not present

## 2012-07-06 DIAGNOSIS — M9981 Other biomechanical lesions of cervical region: Secondary | ICD-10-CM | POA: Diagnosis not present

## 2012-07-06 DIAGNOSIS — M531 Cervicobrachial syndrome: Secondary | ICD-10-CM | POA: Diagnosis not present

## 2012-07-06 DIAGNOSIS — M999 Biomechanical lesion, unspecified: Secondary | ICD-10-CM | POA: Diagnosis not present

## 2012-07-08 DIAGNOSIS — E7800 Pure hypercholesterolemia, unspecified: Secondary | ICD-10-CM | POA: Diagnosis not present

## 2012-07-08 DIAGNOSIS — IMO0001 Reserved for inherently not codable concepts without codable children: Secondary | ICD-10-CM | POA: Diagnosis not present

## 2012-07-08 DIAGNOSIS — Z23 Encounter for immunization: Secondary | ICD-10-CM | POA: Diagnosis not present

## 2012-07-08 DIAGNOSIS — I10 Essential (primary) hypertension: Secondary | ICD-10-CM | POA: Diagnosis not present

## 2012-07-20 DIAGNOSIS — M531 Cervicobrachial syndrome: Secondary | ICD-10-CM | POA: Diagnosis not present

## 2012-07-20 DIAGNOSIS — M999 Biomechanical lesion, unspecified: Secondary | ICD-10-CM | POA: Diagnosis not present

## 2012-07-20 DIAGNOSIS — M9981 Other biomechanical lesions of cervical region: Secondary | ICD-10-CM | POA: Diagnosis not present

## 2012-08-15 DIAGNOSIS — M531 Cervicobrachial syndrome: Secondary | ICD-10-CM | POA: Diagnosis not present

## 2012-08-15 DIAGNOSIS — M9981 Other biomechanical lesions of cervical region: Secondary | ICD-10-CM | POA: Diagnosis not present

## 2012-08-15 DIAGNOSIS — M999 Biomechanical lesion, unspecified: Secondary | ICD-10-CM | POA: Diagnosis not present

## 2012-09-19 DIAGNOSIS — M999 Biomechanical lesion, unspecified: Secondary | ICD-10-CM | POA: Diagnosis not present

## 2012-09-19 DIAGNOSIS — M531 Cervicobrachial syndrome: Secondary | ICD-10-CM | POA: Diagnosis not present

## 2012-09-19 DIAGNOSIS — M9981 Other biomechanical lesions of cervical region: Secondary | ICD-10-CM | POA: Diagnosis not present

## 2012-10-17 DIAGNOSIS — M9981 Other biomechanical lesions of cervical region: Secondary | ICD-10-CM | POA: Diagnosis not present

## 2012-10-17 DIAGNOSIS — M999 Biomechanical lesion, unspecified: Secondary | ICD-10-CM | POA: Diagnosis not present

## 2012-10-17 DIAGNOSIS — M531 Cervicobrachial syndrome: Secondary | ICD-10-CM | POA: Diagnosis not present

## 2012-11-23 DIAGNOSIS — L930 Discoid lupus erythematosus: Secondary | ICD-10-CM | POA: Diagnosis not present

## 2012-11-23 DIAGNOSIS — L299 Pruritus, unspecified: Secondary | ICD-10-CM | POA: Diagnosis not present

## 2012-11-25 DIAGNOSIS — M999 Biomechanical lesion, unspecified: Secondary | ICD-10-CM | POA: Diagnosis not present

## 2012-11-25 DIAGNOSIS — M9981 Other biomechanical lesions of cervical region: Secondary | ICD-10-CM | POA: Diagnosis not present

## 2012-11-25 DIAGNOSIS — M531 Cervicobrachial syndrome: Secondary | ICD-10-CM | POA: Diagnosis not present

## 2012-11-29 DIAGNOSIS — I359 Nonrheumatic aortic valve disorder, unspecified: Secondary | ICD-10-CM | POA: Diagnosis not present

## 2012-11-29 DIAGNOSIS — I10 Essential (primary) hypertension: Secondary | ICD-10-CM | POA: Diagnosis not present

## 2012-11-29 DIAGNOSIS — E039 Hypothyroidism, unspecified: Secondary | ICD-10-CM | POA: Diagnosis not present

## 2012-11-29 DIAGNOSIS — E1139 Type 2 diabetes mellitus with other diabetic ophthalmic complication: Secondary | ICD-10-CM | POA: Diagnosis not present

## 2012-11-29 DIAGNOSIS — M2550 Pain in unspecified joint: Secondary | ICD-10-CM | POA: Diagnosis not present

## 2012-11-29 DIAGNOSIS — E782 Mixed hyperlipidemia: Secondary | ICD-10-CM | POA: Diagnosis not present

## 2012-12-09 DIAGNOSIS — M999 Biomechanical lesion, unspecified: Secondary | ICD-10-CM | POA: Diagnosis not present

## 2012-12-09 DIAGNOSIS — M531 Cervicobrachial syndrome: Secondary | ICD-10-CM | POA: Diagnosis not present

## 2012-12-09 DIAGNOSIS — M9981 Other biomechanical lesions of cervical region: Secondary | ICD-10-CM | POA: Diagnosis not present

## 2012-12-13 DIAGNOSIS — M25519 Pain in unspecified shoulder: Secondary | ICD-10-CM | POA: Diagnosis not present

## 2012-12-13 DIAGNOSIS — M159 Polyosteoarthritis, unspecified: Secondary | ICD-10-CM | POA: Diagnosis not present

## 2012-12-13 DIAGNOSIS — L930 Discoid lupus erythematosus: Secondary | ICD-10-CM | POA: Diagnosis not present

## 2012-12-23 DIAGNOSIS — E1139 Type 2 diabetes mellitus with other diabetic ophthalmic complication: Secondary | ICD-10-CM | POA: Diagnosis not present

## 2012-12-23 DIAGNOSIS — E11311 Type 2 diabetes mellitus with unspecified diabetic retinopathy with macular edema: Secondary | ICD-10-CM | POA: Diagnosis not present

## 2012-12-23 DIAGNOSIS — E11329 Type 2 diabetes mellitus with mild nonproliferative diabetic retinopathy without macular edema: Secondary | ICD-10-CM | POA: Diagnosis not present

## 2013-01-02 DIAGNOSIS — M999 Biomechanical lesion, unspecified: Secondary | ICD-10-CM | POA: Diagnosis not present

## 2013-01-02 DIAGNOSIS — M9981 Other biomechanical lesions of cervical region: Secondary | ICD-10-CM | POA: Diagnosis not present

## 2013-01-02 DIAGNOSIS — M531 Cervicobrachial syndrome: Secondary | ICD-10-CM | POA: Diagnosis not present

## 2013-01-06 DIAGNOSIS — M531 Cervicobrachial syndrome: Secondary | ICD-10-CM | POA: Diagnosis not present

## 2013-01-06 DIAGNOSIS — M999 Biomechanical lesion, unspecified: Secondary | ICD-10-CM | POA: Diagnosis not present

## 2013-01-06 DIAGNOSIS — M9981 Other biomechanical lesions of cervical region: Secondary | ICD-10-CM | POA: Diagnosis not present

## 2013-02-02 DIAGNOSIS — M999 Biomechanical lesion, unspecified: Secondary | ICD-10-CM | POA: Diagnosis not present

## 2013-02-02 DIAGNOSIS — M9981 Other biomechanical lesions of cervical region: Secondary | ICD-10-CM | POA: Diagnosis not present

## 2013-02-02 DIAGNOSIS — M531 Cervicobrachial syndrome: Secondary | ICD-10-CM | POA: Diagnosis not present

## 2013-02-10 DIAGNOSIS — M9981 Other biomechanical lesions of cervical region: Secondary | ICD-10-CM | POA: Diagnosis not present

## 2013-02-10 DIAGNOSIS — M999 Biomechanical lesion, unspecified: Secondary | ICD-10-CM | POA: Diagnosis not present

## 2013-02-10 DIAGNOSIS — M531 Cervicobrachial syndrome: Secondary | ICD-10-CM | POA: Diagnosis not present

## 2013-03-07 DIAGNOSIS — M999 Biomechanical lesion, unspecified: Secondary | ICD-10-CM | POA: Diagnosis not present

## 2013-03-07 DIAGNOSIS — M9981 Other biomechanical lesions of cervical region: Secondary | ICD-10-CM | POA: Diagnosis not present

## 2013-03-07 DIAGNOSIS — M531 Cervicobrachial syndrome: Secondary | ICD-10-CM | POA: Diagnosis not present

## 2013-03-27 DIAGNOSIS — M999 Biomechanical lesion, unspecified: Secondary | ICD-10-CM | POA: Diagnosis not present

## 2013-03-27 DIAGNOSIS — M9981 Other biomechanical lesions of cervical region: Secondary | ICD-10-CM | POA: Diagnosis not present

## 2013-03-27 DIAGNOSIS — M531 Cervicobrachial syndrome: Secondary | ICD-10-CM | POA: Diagnosis not present

## 2013-03-31 DIAGNOSIS — M999 Biomechanical lesion, unspecified: Secondary | ICD-10-CM | POA: Diagnosis not present

## 2013-03-31 DIAGNOSIS — M9981 Other biomechanical lesions of cervical region: Secondary | ICD-10-CM | POA: Diagnosis not present

## 2013-03-31 DIAGNOSIS — M531 Cervicobrachial syndrome: Secondary | ICD-10-CM | POA: Diagnosis not present

## 2013-04-07 DIAGNOSIS — L299 Pruritus, unspecified: Secondary | ICD-10-CM | POA: Diagnosis not present

## 2013-04-13 DIAGNOSIS — L930 Discoid lupus erythematosus: Secondary | ICD-10-CM | POA: Diagnosis not present

## 2013-04-13 DIAGNOSIS — M79609 Pain in unspecified limb: Secondary | ICD-10-CM | POA: Diagnosis not present

## 2013-04-13 DIAGNOSIS — M159 Polyosteoarthritis, unspecified: Secondary | ICD-10-CM | POA: Diagnosis not present

## 2013-04-13 DIAGNOSIS — M9981 Other biomechanical lesions of cervical region: Secondary | ICD-10-CM | POA: Diagnosis not present

## 2013-04-13 DIAGNOSIS — M999 Biomechanical lesion, unspecified: Secondary | ICD-10-CM | POA: Diagnosis not present

## 2013-04-13 DIAGNOSIS — M531 Cervicobrachial syndrome: Secondary | ICD-10-CM | POA: Diagnosis not present

## 2013-04-13 DIAGNOSIS — M25519 Pain in unspecified shoulder: Secondary | ICD-10-CM | POA: Diagnosis not present

## 2013-05-03 DIAGNOSIS — E11329 Type 2 diabetes mellitus with mild nonproliferative diabetic retinopathy without macular edema: Secondary | ICD-10-CM | POA: Diagnosis not present

## 2013-05-03 DIAGNOSIS — E1139 Type 2 diabetes mellitus with other diabetic ophthalmic complication: Secondary | ICD-10-CM | POA: Diagnosis not present

## 2013-05-03 DIAGNOSIS — Z79899 Other long term (current) drug therapy: Secondary | ICD-10-CM | POA: Diagnosis not present

## 2013-05-03 DIAGNOSIS — E11311 Type 2 diabetes mellitus with unspecified diabetic retinopathy with macular edema: Secondary | ICD-10-CM | POA: Diagnosis not present

## 2013-05-08 DIAGNOSIS — M531 Cervicobrachial syndrome: Secondary | ICD-10-CM | POA: Diagnosis not present

## 2013-05-08 DIAGNOSIS — M999 Biomechanical lesion, unspecified: Secondary | ICD-10-CM | POA: Diagnosis not present

## 2013-05-08 DIAGNOSIS — M9981 Other biomechanical lesions of cervical region: Secondary | ICD-10-CM | POA: Diagnosis not present

## 2013-05-14 DIAGNOSIS — Z23 Encounter for immunization: Secondary | ICD-10-CM | POA: Diagnosis not present

## 2013-05-17 DIAGNOSIS — E11311 Type 2 diabetes mellitus with unspecified diabetic retinopathy with macular edema: Secondary | ICD-10-CM | POA: Diagnosis not present

## 2013-05-18 DIAGNOSIS — M531 Cervicobrachial syndrome: Secondary | ICD-10-CM | POA: Diagnosis not present

## 2013-05-18 DIAGNOSIS — M999 Biomechanical lesion, unspecified: Secondary | ICD-10-CM | POA: Diagnosis not present

## 2013-05-18 DIAGNOSIS — M9981 Other biomechanical lesions of cervical region: Secondary | ICD-10-CM | POA: Diagnosis not present

## 2013-05-30 DIAGNOSIS — M531 Cervicobrachial syndrome: Secondary | ICD-10-CM | POA: Diagnosis not present

## 2013-05-30 DIAGNOSIS — M9981 Other biomechanical lesions of cervical region: Secondary | ICD-10-CM | POA: Diagnosis not present

## 2013-05-30 DIAGNOSIS — M999 Biomechanical lesion, unspecified: Secondary | ICD-10-CM | POA: Diagnosis not present

## 2013-06-08 DIAGNOSIS — M999 Biomechanical lesion, unspecified: Secondary | ICD-10-CM | POA: Diagnosis not present

## 2013-06-08 DIAGNOSIS — M531 Cervicobrachial syndrome: Secondary | ICD-10-CM | POA: Diagnosis not present

## 2013-06-08 DIAGNOSIS — M9981 Other biomechanical lesions of cervical region: Secondary | ICD-10-CM | POA: Diagnosis not present

## 2013-06-29 DIAGNOSIS — M999 Biomechanical lesion, unspecified: Secondary | ICD-10-CM | POA: Diagnosis not present

## 2013-06-29 DIAGNOSIS — M9981 Other biomechanical lesions of cervical region: Secondary | ICD-10-CM | POA: Diagnosis not present

## 2013-06-29 DIAGNOSIS — M531 Cervicobrachial syndrome: Secondary | ICD-10-CM | POA: Diagnosis not present

## 2013-07-10 DIAGNOSIS — M999 Biomechanical lesion, unspecified: Secondary | ICD-10-CM | POA: Diagnosis not present

## 2013-07-10 DIAGNOSIS — M531 Cervicobrachial syndrome: Secondary | ICD-10-CM | POA: Diagnosis not present

## 2013-07-10 DIAGNOSIS — M9981 Other biomechanical lesions of cervical region: Secondary | ICD-10-CM | POA: Diagnosis not present

## 2013-07-31 DIAGNOSIS — E782 Mixed hyperlipidemia: Secondary | ICD-10-CM | POA: Diagnosis not present

## 2013-07-31 DIAGNOSIS — L299 Pruritus, unspecified: Secondary | ICD-10-CM | POA: Diagnosis not present

## 2013-07-31 DIAGNOSIS — E039 Hypothyroidism, unspecified: Secondary | ICD-10-CM | POA: Diagnosis not present

## 2013-07-31 DIAGNOSIS — E1139 Type 2 diabetes mellitus with other diabetic ophthalmic complication: Secondary | ICD-10-CM | POA: Diagnosis not present

## 2013-07-31 DIAGNOSIS — I359 Nonrheumatic aortic valve disorder, unspecified: Secondary | ICD-10-CM | POA: Diagnosis not present

## 2013-07-31 DIAGNOSIS — M2550 Pain in unspecified joint: Secondary | ICD-10-CM | POA: Diagnosis not present

## 2013-07-31 DIAGNOSIS — I10 Essential (primary) hypertension: Secondary | ICD-10-CM | POA: Diagnosis not present

## 2013-08-17 DIAGNOSIS — T300 Burn of unspecified body region, unspecified degree: Secondary | ICD-10-CM

## 2013-08-17 HISTORY — DX: Burn of unspecified body region, unspecified degree: T30.0

## 2013-08-23 DIAGNOSIS — M9981 Other biomechanical lesions of cervical region: Secondary | ICD-10-CM | POA: Diagnosis not present

## 2013-08-23 DIAGNOSIS — E11311 Type 2 diabetes mellitus with unspecified diabetic retinopathy with macular edema: Secondary | ICD-10-CM | POA: Diagnosis not present

## 2013-08-23 DIAGNOSIS — E1139 Type 2 diabetes mellitus with other diabetic ophthalmic complication: Secondary | ICD-10-CM | POA: Diagnosis not present

## 2013-08-23 DIAGNOSIS — M999 Biomechanical lesion, unspecified: Secondary | ICD-10-CM | POA: Diagnosis not present

## 2013-08-23 DIAGNOSIS — M531 Cervicobrachial syndrome: Secondary | ICD-10-CM | POA: Diagnosis not present

## 2013-08-24 DIAGNOSIS — M9981 Other biomechanical lesions of cervical region: Secondary | ICD-10-CM | POA: Diagnosis not present

## 2013-08-24 DIAGNOSIS — M999 Biomechanical lesion, unspecified: Secondary | ICD-10-CM | POA: Diagnosis not present

## 2013-08-24 DIAGNOSIS — M531 Cervicobrachial syndrome: Secondary | ICD-10-CM | POA: Diagnosis not present

## 2013-09-20 DIAGNOSIS — L930 Discoid lupus erythematosus: Secondary | ICD-10-CM | POA: Diagnosis not present

## 2013-09-20 DIAGNOSIS — M25519 Pain in unspecified shoulder: Secondary | ICD-10-CM | POA: Diagnosis not present

## 2013-09-20 DIAGNOSIS — M79609 Pain in unspecified limb: Secondary | ICD-10-CM | POA: Diagnosis not present

## 2013-09-20 DIAGNOSIS — M159 Polyosteoarthritis, unspecified: Secondary | ICD-10-CM | POA: Diagnosis not present

## 2013-09-20 DIAGNOSIS — M19019 Primary osteoarthritis, unspecified shoulder: Secondary | ICD-10-CM | POA: Diagnosis not present

## 2013-09-22 DIAGNOSIS — M9981 Other biomechanical lesions of cervical region: Secondary | ICD-10-CM | POA: Diagnosis not present

## 2013-09-22 DIAGNOSIS — M531 Cervicobrachial syndrome: Secondary | ICD-10-CM | POA: Diagnosis not present

## 2013-09-22 DIAGNOSIS — M999 Biomechanical lesion, unspecified: Secondary | ICD-10-CM | POA: Diagnosis not present

## 2013-09-27 DIAGNOSIS — M25519 Pain in unspecified shoulder: Secondary | ICD-10-CM | POA: Diagnosis not present

## 2013-09-28 DIAGNOSIS — M25519 Pain in unspecified shoulder: Secondary | ICD-10-CM | POA: Diagnosis not present

## 2013-10-05 DIAGNOSIS — M25519 Pain in unspecified shoulder: Secondary | ICD-10-CM | POA: Diagnosis not present

## 2013-10-12 DIAGNOSIS — M25519 Pain in unspecified shoulder: Secondary | ICD-10-CM | POA: Diagnosis not present

## 2013-10-16 DIAGNOSIS — M9981 Other biomechanical lesions of cervical region: Secondary | ICD-10-CM | POA: Diagnosis not present

## 2013-10-16 DIAGNOSIS — M531 Cervicobrachial syndrome: Secondary | ICD-10-CM | POA: Diagnosis not present

## 2013-10-16 DIAGNOSIS — M999 Biomechanical lesion, unspecified: Secondary | ICD-10-CM | POA: Diagnosis not present

## 2013-10-17 DIAGNOSIS — M5430 Sciatica, unspecified side: Secondary | ICD-10-CM | POA: Diagnosis not present

## 2013-10-17 DIAGNOSIS — M79609 Pain in unspecified limb: Secondary | ICD-10-CM | POA: Diagnosis not present

## 2013-10-17 DIAGNOSIS — M25519 Pain in unspecified shoulder: Secondary | ICD-10-CM | POA: Diagnosis not present

## 2013-10-19 DIAGNOSIS — M5430 Sciatica, unspecified side: Secondary | ICD-10-CM | POA: Diagnosis not present

## 2013-10-19 DIAGNOSIS — M79609 Pain in unspecified limb: Secondary | ICD-10-CM | POA: Diagnosis not present

## 2013-10-19 DIAGNOSIS — M25519 Pain in unspecified shoulder: Secondary | ICD-10-CM | POA: Diagnosis not present

## 2013-10-24 DIAGNOSIS — M79609 Pain in unspecified limb: Secondary | ICD-10-CM | POA: Diagnosis not present

## 2013-10-24 DIAGNOSIS — M5430 Sciatica, unspecified side: Secondary | ICD-10-CM | POA: Diagnosis not present

## 2013-10-24 DIAGNOSIS — M25519 Pain in unspecified shoulder: Secondary | ICD-10-CM | POA: Diagnosis not present

## 2013-10-26 DIAGNOSIS — M79609 Pain in unspecified limb: Secondary | ICD-10-CM | POA: Diagnosis not present

## 2013-10-26 DIAGNOSIS — M25519 Pain in unspecified shoulder: Secondary | ICD-10-CM | POA: Diagnosis not present

## 2013-10-26 DIAGNOSIS — M5430 Sciatica, unspecified side: Secondary | ICD-10-CM | POA: Diagnosis not present

## 2013-10-28 ENCOUNTER — Encounter: Payer: Self-pay | Admitting: *Deleted

## 2013-10-31 DIAGNOSIS — M5430 Sciatica, unspecified side: Secondary | ICD-10-CM | POA: Diagnosis not present

## 2013-10-31 DIAGNOSIS — M79609 Pain in unspecified limb: Secondary | ICD-10-CM | POA: Diagnosis not present

## 2013-10-31 DIAGNOSIS — M25519 Pain in unspecified shoulder: Secondary | ICD-10-CM | POA: Diagnosis not present

## 2013-11-02 DIAGNOSIS — M25519 Pain in unspecified shoulder: Secondary | ICD-10-CM | POA: Diagnosis not present

## 2013-11-02 DIAGNOSIS — M79609 Pain in unspecified limb: Secondary | ICD-10-CM | POA: Diagnosis not present

## 2013-11-02 DIAGNOSIS — M5430 Sciatica, unspecified side: Secondary | ICD-10-CM | POA: Diagnosis not present

## 2013-11-08 DIAGNOSIS — M25519 Pain in unspecified shoulder: Secondary | ICD-10-CM | POA: Diagnosis not present

## 2013-11-08 DIAGNOSIS — M5430 Sciatica, unspecified side: Secondary | ICD-10-CM | POA: Diagnosis not present

## 2013-11-08 DIAGNOSIS — M79609 Pain in unspecified limb: Secondary | ICD-10-CM | POA: Diagnosis not present

## 2013-11-10 DIAGNOSIS — K754 Autoimmune hepatitis: Secondary | ICD-10-CM | POA: Diagnosis not present

## 2013-11-10 DIAGNOSIS — Z23 Encounter for immunization: Secondary | ICD-10-CM | POA: Diagnosis not present

## 2013-11-10 DIAGNOSIS — E1139 Type 2 diabetes mellitus with other diabetic ophthalmic complication: Secondary | ICD-10-CM | POA: Diagnosis not present

## 2013-11-10 DIAGNOSIS — E119 Type 2 diabetes mellitus without complications: Secondary | ICD-10-CM | POA: Diagnosis not present

## 2013-11-10 DIAGNOSIS — I10 Essential (primary) hypertension: Secondary | ICD-10-CM | POA: Diagnosis not present

## 2013-11-10 DIAGNOSIS — E039 Hypothyroidism, unspecified: Secondary | ICD-10-CM | POA: Diagnosis not present

## 2013-11-10 DIAGNOSIS — E7800 Pure hypercholesterolemia, unspecified: Secondary | ICD-10-CM | POA: Diagnosis not present

## 2013-11-29 DIAGNOSIS — M9981 Other biomechanical lesions of cervical region: Secondary | ICD-10-CM | POA: Diagnosis not present

## 2013-11-29 DIAGNOSIS — M531 Cervicobrachial syndrome: Secondary | ICD-10-CM | POA: Diagnosis not present

## 2013-11-29 DIAGNOSIS — M999 Biomechanical lesion, unspecified: Secondary | ICD-10-CM | POA: Diagnosis not present

## 2013-12-25 DIAGNOSIS — M531 Cervicobrachial syndrome: Secondary | ICD-10-CM | POA: Diagnosis not present

## 2013-12-25 DIAGNOSIS — M999 Biomechanical lesion, unspecified: Secondary | ICD-10-CM | POA: Diagnosis not present

## 2013-12-25 DIAGNOSIS — M9981 Other biomechanical lesions of cervical region: Secondary | ICD-10-CM | POA: Diagnosis not present

## 2014-01-01 DIAGNOSIS — Z79899 Other long term (current) drug therapy: Secondary | ICD-10-CM | POA: Diagnosis not present

## 2014-01-01 DIAGNOSIS — E11329 Type 2 diabetes mellitus with mild nonproliferative diabetic retinopathy without macular edema: Secondary | ICD-10-CM | POA: Diagnosis not present

## 2014-01-01 DIAGNOSIS — E1139 Type 2 diabetes mellitus with other diabetic ophthalmic complication: Secondary | ICD-10-CM | POA: Diagnosis not present

## 2014-01-01 DIAGNOSIS — E11311 Type 2 diabetes mellitus with unspecified diabetic retinopathy with macular edema: Secondary | ICD-10-CM | POA: Diagnosis not present

## 2014-01-25 DIAGNOSIS — M25519 Pain in unspecified shoulder: Secondary | ICD-10-CM | POA: Diagnosis not present

## 2014-01-25 DIAGNOSIS — M79609 Pain in unspecified limb: Secondary | ICD-10-CM | POA: Diagnosis not present

## 2014-01-25 DIAGNOSIS — L930 Discoid lupus erythematosus: Secondary | ICD-10-CM | POA: Diagnosis not present

## 2014-01-25 DIAGNOSIS — M159 Polyosteoarthritis, unspecified: Secondary | ICD-10-CM | POA: Diagnosis not present

## 2014-02-22 DIAGNOSIS — M531 Cervicobrachial syndrome: Secondary | ICD-10-CM | POA: Diagnosis not present

## 2014-02-22 DIAGNOSIS — M9981 Other biomechanical lesions of cervical region: Secondary | ICD-10-CM | POA: Diagnosis not present

## 2014-02-22 DIAGNOSIS — M999 Biomechanical lesion, unspecified: Secondary | ICD-10-CM | POA: Diagnosis not present

## 2014-02-26 DIAGNOSIS — M25519 Pain in unspecified shoulder: Secondary | ICD-10-CM | POA: Diagnosis not present

## 2014-03-09 DIAGNOSIS — M999 Biomechanical lesion, unspecified: Secondary | ICD-10-CM | POA: Diagnosis not present

## 2014-03-09 DIAGNOSIS — M9981 Other biomechanical lesions of cervical region: Secondary | ICD-10-CM | POA: Diagnosis not present

## 2014-03-09 DIAGNOSIS — M531 Cervicobrachial syndrome: Secondary | ICD-10-CM | POA: Diagnosis not present

## 2014-03-19 DIAGNOSIS — E1139 Type 2 diabetes mellitus with other diabetic ophthalmic complication: Secondary | ICD-10-CM | POA: Diagnosis not present

## 2014-03-19 DIAGNOSIS — H04129 Dry eye syndrome of unspecified lacrimal gland: Secondary | ICD-10-CM | POA: Diagnosis not present

## 2014-03-19 DIAGNOSIS — E11329 Type 2 diabetes mellitus with mild nonproliferative diabetic retinopathy without macular edema: Secondary | ICD-10-CM | POA: Diagnosis not present

## 2014-03-27 DIAGNOSIS — M25519 Pain in unspecified shoulder: Secondary | ICD-10-CM | POA: Diagnosis not present

## 2014-04-06 DIAGNOSIS — R1310 Dysphagia, unspecified: Secondary | ICD-10-CM | POA: Diagnosis not present

## 2014-04-06 DIAGNOSIS — R059 Cough, unspecified: Secondary | ICD-10-CM | POA: Diagnosis not present

## 2014-04-06 DIAGNOSIS — R05 Cough: Secondary | ICD-10-CM | POA: Diagnosis not present

## 2014-04-18 DIAGNOSIS — I10 Essential (primary) hypertension: Secondary | ICD-10-CM | POA: Diagnosis not present

## 2014-04-18 DIAGNOSIS — E039 Hypothyroidism, unspecified: Secondary | ICD-10-CM | POA: Diagnosis not present

## 2014-04-18 DIAGNOSIS — Z23 Encounter for immunization: Secondary | ICD-10-CM | POA: Diagnosis not present

## 2014-04-18 DIAGNOSIS — M25519 Pain in unspecified shoulder: Secondary | ICD-10-CM | POA: Diagnosis not present

## 2014-04-18 DIAGNOSIS — E1139 Type 2 diabetes mellitus with other diabetic ophthalmic complication: Secondary | ICD-10-CM | POA: Diagnosis not present

## 2014-04-19 ENCOUNTER — Other Ambulatory Visit: Payer: Self-pay

## 2014-04-19 ENCOUNTER — Ambulatory Visit
Admission: RE | Admit: 2014-04-19 | Discharge: 2014-04-19 | Disposition: A | Discharge status: Home / Self Care | Payer: Medicare Other | Source: Ambulatory Visit

## 2014-04-19 ENCOUNTER — Other Ambulatory Visit: Payer: Self-pay | Admitting: Physician Assistant

## 2014-04-19 DIAGNOSIS — Z1231 Encounter for screening mammogram for malignant neoplasm of breast: Secondary | ICD-10-CM

## 2014-04-19 DIAGNOSIS — M25519 Pain in unspecified shoulder: Secondary | ICD-10-CM | POA: Diagnosis not present

## 2014-04-19 DIAGNOSIS — M999 Biomechanical lesion, unspecified: Secondary | ICD-10-CM | POA: Diagnosis not present

## 2014-04-19 DIAGNOSIS — M9981 Other biomechanical lesions of cervical region: Secondary | ICD-10-CM | POA: Diagnosis not present

## 2014-04-19 DIAGNOSIS — M531 Cervicobrachial syndrome: Secondary | ICD-10-CM | POA: Diagnosis not present

## 2014-04-24 ENCOUNTER — Other Ambulatory Visit: Payer: Self-pay | Admitting: Physician Assistant

## 2014-04-24 NOTE — H&P (Signed)
Chief complaint: HPI.  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  Returns for follow up.  Symptoms are unchanged from previous visits and she wishes to proceed with right total shoulder replacement as scheduled.  She has significant pain with activity that is affecting her quality of life and ability to perform ADLs.  She has failed to respond to conservative treatment options including intraarticular Cortisone injection.  Current medications: Crestor, Avapro, Doxazosin, Januvia, Amlodipine, Metformin, Xanax, Furosemide, Hydrocodone, Toprol, Levothyroxine, Aspirin, fish oil, Synthroid and Thymine factor. Allergies: Cortisone injections (increased her blood pressure). Past medical history: Significant for vision change, glasses, dysphagia, hoarseness, dentures, high blood pressure, aortic stenosis, aortic stenotic heart murmur, diarrhea, constipation, autoimmune hepatitis, hemorrhoids, colitis, hypothyroidism, diabetes, ankle swelling, bladder infection, frequent urination, night urination, headaches and nervous tension.  Past surgical history: Significant for hysterectomy in 1980, left total knee replacement, left total shoulder replacement, thyroidectomy in 2010, laryngoplasty in 2010 and bilateral knee arthroscopies in 2010. Review of systems: Patient currently denies lightheadedness, dizziness, fevers, chills, chest pain, pressure, palpitations, shortness of breath, or any GI, GU or neuro issues.  Family history: Significant for heart disease in brother and sister, heart attack in sister, high blood pressure in brother and sister, stroke in brother and cancer in her mother and brother.   Social history: Does not smoke or drink alcoholic beverages.  She is married and lives with her husband.      EXAMINATION: Height: 5?3.  Weight: 203 pounds.  Respirations: 24.  Blood pressure: 149/57.  Pulse: 57.    Temperature: 97.8 F.  She  is alert and oriented x 3.  No acute distress.  She does have an antalgic gait with a Trendelenburg component on the right.  Head is normocephalic, a traumatic.  EOMI, PERRLA.  Neck: Unremarkable.  Lungs are clear to auscultation bilaterally.  No wheezes, rales or rhonchi.  Heart has a regular rate with an aortic stenotic murmur.  Abdomen is soft and non-tender with normal active bowel sounds x 4.  Calves are non-tender bilaterally.  Neuro: Grossly intact in bilateral upper and lower extremities.  Skin is warm and dry.  Examination of her right shoulder reveals less than 50% of active and passive motion.  Marked grating and crepitus.  I think her cuff is intact.  She is neurovascularly intact distally.    X-RAYS: Previous imaging of her right shoulder demonstrates severe end stage degenerative joint changes.   IMPRESSION: Right shoulder end stage degenerative joint changes and chronic pain which has failed to respond to conservative treatment options.     DISPOSITION:  We will proceed with right total shoulder replacement as scheduled.  Risks, benefits and possible complications of surgery have been discussed.  Rehab and recovery time discussed as well.  All questions were answered.  Oretha Caprice, PA-C

## 2014-04-30 ENCOUNTER — Inpatient Hospital Stay (HOSPITAL_COMMUNITY): Admission: RE | Admit: 2014-04-30 | Payer: Medicare Other | Source: Ambulatory Visit

## 2014-05-03 ENCOUNTER — Encounter (HOSPITAL_COMMUNITY): Payer: Self-pay | Admitting: Pharmacy Technician

## 2014-05-07 ENCOUNTER — Ambulatory Visit (HOSPITAL_COMMUNITY)
Admission: RE | Admit: 2014-05-07 | Discharge: 2014-05-07 | Disposition: A | Discharge status: Home / Self Care | Payer: Medicare Other | Source: Ambulatory Visit | Attending: Physician Assistant | Admitting: Physician Assistant

## 2014-05-07 ENCOUNTER — Encounter (HOSPITAL_COMMUNITY)
Admission: RE | Admit: 2014-05-07 | Discharge: 2014-05-07 | Disposition: A | Discharge status: Home / Self Care | Payer: Medicare Other | Source: Ambulatory Visit | Attending: Orthopedic Surgery | Admitting: Orthopedic Surgery

## 2014-05-07 ENCOUNTER — Encounter (HOSPITAL_COMMUNITY): Payer: Self-pay

## 2014-05-07 DIAGNOSIS — I517 Cardiomegaly: Secondary | ICD-10-CM | POA: Diagnosis not present

## 2014-05-07 DIAGNOSIS — K589 Irritable bowel syndrome without diarrhea: Secondary | ICD-10-CM | POA: Diagnosis not present

## 2014-05-07 DIAGNOSIS — Z01812 Encounter for preprocedural laboratory examination: Secondary | ICD-10-CM | POA: Diagnosis not present

## 2014-05-07 DIAGNOSIS — R0989 Other specified symptoms and signs involving the circulatory and respiratory systems: Secondary | ICD-10-CM | POA: Insufficient documentation

## 2014-05-07 DIAGNOSIS — R0609 Other forms of dyspnea: Secondary | ICD-10-CM | POA: Diagnosis not present

## 2014-05-07 DIAGNOSIS — Z96659 Presence of unspecified artificial knee joint: Secondary | ICD-10-CM | POA: Diagnosis not present

## 2014-05-07 DIAGNOSIS — F411 Generalized anxiety disorder: Secondary | ICD-10-CM | POA: Diagnosis not present

## 2014-05-07 DIAGNOSIS — K219 Gastro-esophageal reflux disease without esophagitis: Secondary | ICD-10-CM | POA: Insufficient documentation

## 2014-05-07 DIAGNOSIS — I452 Bifascicular block: Secondary | ICD-10-CM | POA: Insufficient documentation

## 2014-05-07 DIAGNOSIS — Z90710 Acquired absence of both cervix and uterus: Secondary | ICD-10-CM | POA: Diagnosis not present

## 2014-05-07 DIAGNOSIS — I4949 Other premature depolarization: Secondary | ICD-10-CM | POA: Diagnosis not present

## 2014-05-07 DIAGNOSIS — J3801 Paralysis of vocal cords and larynx, unilateral: Secondary | ICD-10-CM | POA: Diagnosis not present

## 2014-05-07 DIAGNOSIS — L80 Vitiligo: Secondary | ICD-10-CM | POA: Insufficient documentation

## 2014-05-07 DIAGNOSIS — Z01818 Encounter for other preprocedural examination: Secondary | ICD-10-CM | POA: Diagnosis not present

## 2014-05-07 DIAGNOSIS — E7800 Pure hypercholesterolemia, unspecified: Secondary | ICD-10-CM | POA: Diagnosis not present

## 2014-05-07 DIAGNOSIS — E119 Type 2 diabetes mellitus without complications: Secondary | ICD-10-CM | POA: Insufficient documentation

## 2014-05-07 DIAGNOSIS — I10 Essential (primary) hypertension: Secondary | ICD-10-CM | POA: Insufficient documentation

## 2014-05-07 DIAGNOSIS — J42 Unspecified chronic bronchitis: Secondary | ICD-10-CM | POA: Diagnosis not present

## 2014-05-07 DIAGNOSIS — E890 Postprocedural hypothyroidism: Secondary | ICD-10-CM | POA: Insufficient documentation

## 2014-05-07 DIAGNOSIS — K754 Autoimmune hepatitis: Secondary | ICD-10-CM | POA: Insufficient documentation

## 2014-05-07 DIAGNOSIS — Z9089 Acquired absence of other organs: Secondary | ICD-10-CM | POA: Diagnosis not present

## 2014-05-07 HISTORY — DX: Gastro-esophageal reflux disease without esophagitis: K21.9

## 2014-05-07 HISTORY — DX: Vitiligo: L80

## 2014-05-07 HISTORY — DX: Inflammatory liver disease, unspecified: K75.9

## 2014-05-07 HISTORY — DX: Irritable bowel syndrome, unspecified: K58.9

## 2014-05-07 HISTORY — DX: Pure hypercholesterolemia, unspecified: E78.00

## 2014-05-07 HISTORY — DX: Anxiety disorder, unspecified: F41.9

## 2014-05-07 HISTORY — DX: Hypothyroidism, unspecified: E03.9

## 2014-05-07 HISTORY — DX: Other diseases of larynx: J38.7

## 2014-05-07 HISTORY — DX: Cardiac murmur, unspecified: R01.1

## 2014-05-07 HISTORY — DX: Shortness of breath: R06.02

## 2014-05-07 LAB — URINALYSIS, ROUTINE W REFLEX MICROSCOPIC
Bilirubin Urine: NEGATIVE
Glucose, UA: >1000 mg/dL — AB
Hgb urine dipstick: NEGATIVE
Ketones, ur: NEGATIVE mg/dL
Leukocytes, UA: NEGATIVE
Nitrite: NEGATIVE
Protein, ur: NEGATIVE mg/dL
Specific Gravity, Urine: 1.025 (ref 1.005–1.030)
Urobilinogen, UA: 0.2 mg/dL (ref 0.0–1.0)
pH: 5 (ref 5.0–8.0)

## 2014-05-07 LAB — ABO/RH: ABO/RH(D): A POS

## 2014-05-07 LAB — COMPREHENSIVE METABOLIC PANEL
ALT: 31 U/L (ref 0–35)
AST: 41 U/L — ABNORMAL HIGH (ref 0–37)
Albumin: 3.9 g/dL (ref 3.5–5.2)
Alkaline Phosphatase: 84 U/L (ref 39–117)
Anion gap: 16 — ABNORMAL HIGH (ref 5–15)
BUN: 12 mg/dL (ref 6–23)
CO2: 25 mEq/L (ref 19–32)
Calcium: 10.1 mg/dL (ref 8.4–10.5)
Chloride: 98 mEq/L (ref 96–112)
Creatinine, Ser: 0.56 mg/dL (ref 0.50–1.10)
GFR calc Af Amer: >90 mL/min (ref >90)
GFR calc non Af Amer: 86 mL/min — ABNORMAL LOW (ref >90)
Glucose, Bld: 193 mg/dL — ABNORMAL HIGH (ref 70–99)
Potassium: 4.3 mEq/L (ref 3.7–5.3)
Sodium: 139 mEq/L (ref 137–147)
Total Bilirubin: 0.2 mg/dL — ABNORMAL LOW (ref 0.3–1.2)
Total Protein: 7.9 g/dL (ref 6.0–8.3)

## 2014-05-07 LAB — CBC WITH DIFFERENTIAL/PLATELET
Basophils Absolute: 0 10*3/uL (ref 0.0–0.1)
Basophils Relative: 1 % (ref 0–1)
Eosinophils Absolute: 0.1 10*3/uL (ref 0.0–0.7)
Eosinophils Relative: 3 % (ref 0–5)
HCT: 40.5 % (ref 36.0–46.0)
Hemoglobin: 13.3 g/dL (ref 12.0–15.0)
Lymphocytes Relative: 26 % (ref 12–46)
Lymphs Abs: 1.1 10*3/uL (ref 0.7–4.0)
MCH: 28.9 pg (ref 26.0–34.0)
MCHC: 32.8 g/dL (ref 30.0–36.0)
MCV: 88 fL (ref 78.0–100.0)
Monocytes Absolute: 0.4 10*3/uL (ref 0.1–1.0)
Monocytes Relative: 9 % (ref 3–12)
Neutro Abs: 2.5 10*3/uL (ref 1.7–7.7)
Neutrophils Relative %: 61 % (ref 43–77)
Platelets: 182 10*3/uL (ref 150–400)
RBC: 4.6 MIL/uL (ref 3.87–5.11)
RDW: 14 % (ref 11.5–15.5)
WBC: 4.2 10*3/uL (ref 4.0–10.5)

## 2014-05-07 LAB — APTT: aPTT: 33 seconds (ref 24–37)

## 2014-05-07 LAB — URINE MICROSCOPIC-ADD ON

## 2014-05-07 LAB — PROTIME-INR
INR: 1.07 (ref 0.00–1.49)
Prothrombin Time: 13.9 seconds (ref 11.6–15.2)

## 2014-05-07 LAB — TYPE AND SCREEN
ABO/RH(D): A POS
Antibody Screen: NEGATIVE

## 2014-05-07 NOTE — Progress Notes (Signed)
Patient informed Nurse that she had a stress test and cardiac cath approximately 10 years ago; will request records from [place], and LOV was approximately 2-3 years ago. Patient stated she had a heart murmur but has not had a Echo in years since she has not been having any cardiac issues.  PCP is Hulan Fess and she had a EKG approximately 2 weeks ago. Will request records. Patient denied having any chest pain or shortness of breath.

## 2014-05-07 NOTE — Pre-Procedure Instructions (Signed)
Hailey Cobb  05/07/2014   Your procedure is scheduled on:  Wednesday May 16, 2014 at 12:45 PM.  Report to [facility] Admitting at 10:45 AM.  Call this number if you have problems the morning of surgery: [phone]   Remember:   Do not eat food or drink liquids after midnight.   Take these medicines the morning of surgery with A SIP OF WATER: Alprazolam (Xanax) if needed, Amlodipine (Norvasc), Astelin nasal spray,  Doxazosin (Cardura), Esomeprazole (Nexium), Hydrocodone if needed for pain, Levothyroxine (Synthroid), Metoprolol (Toprol XL)   Do NOT take any diabetic medications the morning of your surgery (Ex. Invokana, Metoformin, or Januvia)   Do not wear jewelry, make-up or nail polish.  Do not wear lotions, powders, or perfumes. You may NOT wear deodorant.  Do not shave 48 hours prior to surgery.   Do not bring valuables to the hospital.  [facility] is not responsible for any belongings or valuables.               Contacts, dentures or bridgework may not be worn into surgery.  Leave suitcase in the car. After surgery it may be brought to your room.  For patients admitted to the hospital, discharge time is determined by your treatment team.               Patients discharged the day of surgery will not be allowed to drive home.  Name and phone number of your driver: Family/Friend  Special Instructions: Please shower using CHG soap the night before and the morning of your surgery   Please read over the following fact sheets that you were given: Pain Booklet, Coughing and Deep Breathing, Blood Transfusion Information and Surgical Site Infection Prevention

## 2014-05-08 ENCOUNTER — Encounter (HOSPITAL_COMMUNITY): Payer: Self-pay

## 2014-05-08 NOTE — Progress Notes (Addendum)
Anesthesia Chart Review:  Patient is a 80 year old female scheduled for right total shoulder replacement on 05/16/14 by Dr. Kathryne Hitch.  History includes non-smoker, DM2, HTN, murmur (mild AS by 01/2010 echo), thyroidectomy '10 with secondary hypothyroidism, left vocal cord paralysis following thyroidectomy s/p left medialization laryngoplasty with Gore-Tex 02/07/10 (Dr. Wonda Amis; [facility]), "mucus pooling" following thyroidectomy, exertional dyspnea, IBS, GERD, hypercholesterolemia, autoimmune hepatitis, vitiligo, anxiety, esophogeal dilatation, appendectomy, hysterectomy, left shoulder and knee replacements. PCP is Dr. Hulan Fess who cleared patient from a medical standpoint following 04/18/14 office visit. (In his note, he documents "heart...without murmur."  She was referred to cardiologist Dr. Daneen Schick in 02/2011 for fatigue. He was waiting on [facility] records before making further recommendations.  Echo from 2011 showed mild AS.    EKG on 04/18/14 (Dr. Rex Kras) showed: SB at 58 bpm, occasional PVC, right BBB, LAFB, bifascicular block. PVC is new and rsR pattern extends to V4 and V5 when compared to 03/16/11 EKG.  Echo on 01/30/10 [facility]) showed: Normal LV systolic function with mild LVH, EF > 55%. Normal RV systolic function, mild MR, trivial PR/TR. Tricuspid aortic valve mildly thickened with mild aortic stenosis (2.6 m/s peak velocity, 27 mmHg peak gradient, 14 mmHg mean gradient,  1.5 cm2). Dilated aortic root is documented, but all recorded aorta measurements are WNL with the exception of the ascending aorta measuring minimally increased at 3.2 cm (normal 2.3-3.1 cm).   Cardiac cath on 08/25/00 showed normal coronaries for age (minimal luminal irregularities of LM, 20-25% proximal LAD), NL LVF.  CXR on 05/07/14 showed: Mild chronic bronchitic changes but no acute pulmonary findings.  Preoperative labs noted.  AST/ALT mildly elevated at 41/31.  CBC WNL. Glucose 193. Cr 0.56. PT/PTT WNL. Urine culture is  still pending. Recent A1C was 7.9.  I did not evaluate patient during her PAT visit. History of mild AS was not known until we received cardiology records from Dr. Tamala Julian today.  She only had mild AS by echo in 2011.  She had recent PCP evaluation with clearance for surgery.  He felt her EKG was stable. She has not been having any chest pain or SOB. I reviewed above with anesthesiologist Dr. Marcie Bal.  With only mild AS by echo within the past 5 years and medical clearance, he felt that unless she had new CV symptoms he would anticipate that she could proceed as planned.  George Hugh [facility]/Anesthesiology Phone [phone] 05/08/2014 5:15 PM

## 2014-05-09 LAB — URINE CULTURE: Colony Count: 40000

## 2014-05-15 MED ORDER — CHLORHEXIDINE GLUCONATE 4 % EX LIQD
60.0000 mL | Freq: Once | CUTANEOUS | Status: DC
  Filled 2014-05-15: qty 60

## 2014-05-15 MED ORDER — LACTATED RINGERS IV SOLN
INTRAVENOUS | Status: DC
  Administered 2014-05-16: 75 mL/h via INTRAVENOUS

## 2014-05-15 MED ORDER — CEFAZOLIN SODIUM-DEXTROSE 2-3 GM-% IV SOLR
2.0000 g | INTRAVENOUS | Status: AC
  Administered 2014-05-16: 2 g via INTRAVENOUS
  Filled 2014-05-15: qty 50

## 2014-05-16 ENCOUNTER — Encounter (HOSPITAL_COMMUNITY): Payer: Self-pay | Admitting: *Deleted

## 2014-05-16 ENCOUNTER — Encounter (HOSPITAL_COMMUNITY)
Admission: RE | Disposition: A | Discharge status: Home / Self Care | Payer: Self-pay | Source: Ambulatory Visit | Attending: Orthopedic Surgery

## 2014-05-16 ENCOUNTER — Inpatient Hospital Stay (HOSPITAL_COMMUNITY): Payer: Medicare Other | Admitting: Certified Registered"

## 2014-05-16 ENCOUNTER — Encounter (HOSPITAL_COMMUNITY): Payer: Medicare Other | Admitting: Vascular Surgery

## 2014-05-16 ENCOUNTER — Inpatient Hospital Stay (HOSPITAL_COMMUNITY): Payer: Medicare Other

## 2014-05-16 ENCOUNTER — Inpatient Hospital Stay (HOSPITAL_COMMUNITY)
Admission: RE | Admit: 2014-05-16 | Discharge: 2014-05-17 | DRG: 483 | Disposition: A | Discharge status: Home / Self Care | Payer: Medicare Other | Source: Ambulatory Visit | Attending: Orthopedic Surgery | Admitting: Orthopedic Surgery

## 2014-05-16 DIAGNOSIS — Z471 Aftercare following joint replacement surgery: Secondary | ICD-10-CM | POA: Diagnosis not present

## 2014-05-16 DIAGNOSIS — Z809 Family history of malignant neoplasm, unspecified: Secondary | ICD-10-CM

## 2014-05-16 DIAGNOSIS — M25511 Pain in right shoulder: Secondary | ICD-10-CM | POA: Diagnosis not present

## 2014-05-16 DIAGNOSIS — Z96652 Presence of left artificial knee joint: Secondary | ICD-10-CM | POA: Diagnosis present

## 2014-05-16 DIAGNOSIS — E780 Pure hypercholesterolemia: Secondary | ICD-10-CM | POA: Diagnosis present

## 2014-05-16 DIAGNOSIS — E039 Hypothyroidism, unspecified: Secondary | ICD-10-CM | POA: Diagnosis present

## 2014-05-16 DIAGNOSIS — I10 Essential (primary) hypertension: Secondary | ICD-10-CM | POA: Diagnosis present

## 2014-05-16 DIAGNOSIS — E119 Type 2 diabetes mellitus without complications: Secondary | ICD-10-CM | POA: Diagnosis present

## 2014-05-16 DIAGNOSIS — R1310 Dysphagia, unspecified: Secondary | ICD-10-CM | POA: Diagnosis present

## 2014-05-16 DIAGNOSIS — M19011 Primary osteoarthritis, right shoulder: Secondary | ICD-10-CM | POA: Diagnosis present

## 2014-05-16 DIAGNOSIS — F419 Anxiety disorder, unspecified: Secondary | ICD-10-CM | POA: Diagnosis present

## 2014-05-16 DIAGNOSIS — M1990 Unspecified osteoarthritis, unspecified site: Secondary | ICD-10-CM | POA: Diagnosis not present

## 2014-05-16 DIAGNOSIS — G8918 Other acute postprocedural pain: Secondary | ICD-10-CM | POA: Diagnosis not present

## 2014-05-16 DIAGNOSIS — Z96612 Presence of left artificial shoulder joint: Secondary | ICD-10-CM | POA: Diagnosis present

## 2014-05-16 DIAGNOSIS — G8929 Other chronic pain: Secondary | ICD-10-CM | POA: Diagnosis present

## 2014-05-16 DIAGNOSIS — K219 Gastro-esophageal reflux disease without esophagitis: Secondary | ICD-10-CM | POA: Diagnosis present

## 2014-05-16 DIAGNOSIS — Z8249 Family history of ischemic heart disease and other diseases of the circulatory system: Secondary | ICD-10-CM

## 2014-05-16 DIAGNOSIS — Z823 Family history of stroke: Secondary | ICD-10-CM

## 2014-05-16 DIAGNOSIS — Z96619 Presence of unspecified artificial shoulder joint: Secondary | ICD-10-CM | POA: Diagnosis not present

## 2014-05-16 DIAGNOSIS — M19019 Primary osteoarthritis, unspecified shoulder: Secondary | ICD-10-CM | POA: Diagnosis not present

## 2014-05-16 HISTORY — PX: TOTAL SHOULDER ARTHROPLASTY: SHX126

## 2014-05-16 LAB — GLUCOSE, CAPILLARY
Glucose-Capillary: 141 mg/dL — ABNORMAL HIGH (ref 70–99)
Glucose-Capillary: 159 mg/dL — ABNORMAL HIGH (ref 70–99)
Glucose-Capillary: 131 mg/dL — ABNORMAL HIGH (ref 70–99)
Glucose-Capillary: 188 mg/dL — ABNORMAL HIGH (ref 70–99)

## 2014-05-16 SURGERY — ARTHROPLASTY, SHOULDER, TOTAL
Anesthesia: General | Site: Shoulder | Laterality: Right

## 2014-05-16 MED ORDER — GLYCOPYRROLATE 0.2 MG/ML IJ SOLN
INTRAMUSCULAR | Status: DC | PRN
  Administered 2014-05-16: 0.4 mg via INTRAVENOUS

## 2014-05-16 MED ORDER — FENTANYL CITRATE 0.05 MG/ML IJ SOLN
INTRAMUSCULAR | Status: DC | PRN
  Administered 2014-05-16 (×3): 25 ug via INTRAVENOUS

## 2014-05-16 MED ORDER — CEFAZOLIN SODIUM-DEXTROSE 2-3 GM-% IV SOLR
2.0000 g | Freq: Four times a day (QID) | INTRAVENOUS | Status: AC
  Administered 2014-05-16 – 2014-05-17 (×3): 2 g via INTRAVENOUS
  Filled 2014-05-16 (×3): qty 50

## 2014-05-16 MED ORDER — ARTIFICIAL TEARS OP OINT
TOPICAL_OINTMENT | OPHTHALMIC | Status: AC
  Filled 2014-05-16: qty 3.5

## 2014-05-16 MED ORDER — NEOSTIGMINE METHYLSULFATE 10 MG/10ML IV SOLN
INTRAVENOUS | Status: AC
  Filled 2014-05-16: qty 1

## 2014-05-16 MED ORDER — DIPHENHYDRAMINE HCL 50 MG/ML IJ SOLN
INTRAMUSCULAR | Status: DC | PRN
  Administered 2014-05-16: 10 mg via INTRAVENOUS

## 2014-05-16 MED ORDER — BISACODYL 5 MG PO TBEC: 5.0000 mg | DELAYED_RELEASE_TABLET | Freq: Every day | ORAL | Status: DC | PRN

## 2014-05-16 MED ORDER — METOCLOPRAMIDE HCL 5 MG/ML IJ SOLN: 5.0000 mg | Freq: Three times a day (TID) | INTRAMUSCULAR | Status: DC | PRN

## 2014-05-16 MED ORDER — ASPIRIN EC 81 MG PO TBEC
81.0000 mg | DELAYED_RELEASE_TABLET | Freq: Every day | ORAL | Status: DC
  Administered 2014-05-17: 81 mg via ORAL
  Filled 2014-05-16: qty 1

## 2014-05-16 MED ORDER — LEVOTHYROXINE SODIUM 125 MCG PO TABS
125.0000 ug | ORAL_TABLET | Freq: Every day | ORAL | Status: DC
  Administered 2014-05-17: 125 ug via ORAL
  Filled 2014-05-16 (×2): qty 1

## 2014-05-16 MED ORDER — DEXTROSE 5 % IV SOLN
10.0000 mg | INTRAVENOUS | Status: DC | PRN
  Administered 2014-05-16: 20 ug/min via INTRAVENOUS

## 2014-05-16 MED ORDER — METHOCARBAMOL 1000 MG/10ML IJ SOLN
500.0000 mg | Freq: Four times a day (QID) | INTRAMUSCULAR | Status: DC | PRN
  Filled 2014-05-16: qty 5

## 2014-05-16 MED ORDER — HYDROCODONE-ACETAMINOPHEN 5-325 MG PO TABS
ORAL_TABLET | ORAL | Status: AC
  Filled 2014-05-16: qty 2

## 2014-05-16 MED ORDER — ALPRAZOLAM 0.5 MG PO TABS
0.5000 mg | ORAL_TABLET | Freq: Every evening | ORAL | Status: DC | PRN
  Administered 2014-05-16: 0.5 mg via ORAL
  Filled 2014-05-16: qty 1

## 2014-05-16 MED ORDER — SODIUM CHLORIDE 0.9 % IR SOLN
Status: DC | PRN
  Administered 2014-05-16: 1

## 2014-05-16 MED ORDER — MENTHOL 3 MG MT LOZG: 1.0000 | LOZENGE | OROMUCOSAL | Status: DC | PRN

## 2014-05-16 MED ORDER — FENTANYL CITRATE 0.05 MG/ML IJ SOLN
INTRAMUSCULAR | Status: AC
  Filled 2014-05-16: qty 5

## 2014-05-16 MED ORDER — ONDANSETRON HCL 4 MG PO TABS: 4.0000 mg | ORAL_TABLET | Freq: Three times a day (TID) | ORAL | Status: DC | PRN

## 2014-05-16 MED ORDER — METFORMIN HCL 500 MG PO TABS
1000.0000 mg | ORAL_TABLET | Freq: Two times a day (BID) | ORAL | Status: DC
  Administered 2014-05-16 – 2014-05-17 (×2): 1000 mg via ORAL
  Filled 2014-05-16 (×4): qty 2

## 2014-05-16 MED ORDER — PROPOFOL 10 MG/ML IV BOLUS
INTRAVENOUS | Status: AC
  Filled 2014-05-16: qty 20

## 2014-05-16 MED ORDER — ATORVASTATIN CALCIUM 40 MG PO TABS
40.0000 mg | ORAL_TABLET | Freq: Every day | ORAL | Status: DC
  Administered 2014-05-16: 40 mg via ORAL
  Filled 2014-05-16 (×2): qty 1

## 2014-05-16 MED ORDER — DIPHENHYDRAMINE HCL 12.5 MG/5ML PO ELIX
12.5000 mg | ORAL_SOLUTION | ORAL | Status: DC | PRN
  Administered 2014-05-17: 25 mg via ORAL
  Filled 2014-05-16: qty 10

## 2014-05-16 MED ORDER — METOPROLOL SUCCINATE ER 50 MG PO TB24
50.0000 mg | ORAL_TABLET | Freq: Two times a day (BID) | ORAL | Status: DC
  Administered 2014-05-16 – 2014-05-17 (×2): 50 mg via ORAL
  Filled 2014-05-16 (×3): qty 1

## 2014-05-16 MED ORDER — GLYCOPYRROLATE 0.2 MG/ML IJ SOLN
INTRAMUSCULAR | Status: AC
  Filled 2014-05-16: qty 2

## 2014-05-16 MED ORDER — FUROSEMIDE 20 MG PO TABS
20.0000 mg | ORAL_TABLET | Freq: Every day | ORAL | Status: DC
  Administered 2014-05-17: 20 mg via ORAL
  Filled 2014-05-16: qty 1

## 2014-05-16 MED ORDER — ROCURONIUM BROMIDE 100 MG/10ML IV SOLN
INTRAVENOUS | Status: DC | PRN
  Administered 2014-05-16: 50 mg via INTRAVENOUS

## 2014-05-16 MED ORDER — ROPIVACAINE HCL 5 MG/ML IJ SOLN
INTRAMUSCULAR | Status: DC | PRN
  Administered 2014-05-16: 30 mg via PERINEURAL

## 2014-05-16 MED ORDER — DOXAZOSIN MESYLATE 2 MG PO TABS
2.0000 mg | ORAL_TABLET | Freq: Two times a day (BID) | ORAL | Status: DC
  Administered 2014-05-16 – 2014-05-17 (×2): 2 mg via ORAL
  Filled 2014-05-16 (×3): qty 1

## 2014-05-16 MED ORDER — PHENOL 1.4 % MT LIQD: 1.0000 | OROMUCOSAL | Status: DC | PRN

## 2014-05-16 MED ORDER — ARTIFICIAL TEARS OP OINT
TOPICAL_OINTMENT | OPHTHALMIC | Status: DC | PRN
  Administered 2014-05-16: 1 via OPHTHALMIC

## 2014-05-16 MED ORDER — ONDANSETRON HCL 4 MG PO TABS
4.0000 mg | ORAL_TABLET | Freq: Four times a day (QID) | ORAL | Status: DC | PRN
Start: 2014-05-16 — End: 2014-05-17

## 2014-05-16 MED ORDER — NEOSTIGMINE METHYLSULFATE 10 MG/10ML IV SOLN
INTRAVENOUS | Status: DC | PRN
  Administered 2014-05-16: 3 mg via INTRAVENOUS

## 2014-05-16 MED ORDER — INSULIN ASPART 100 UNIT/ML ~~LOC~~ SOLN
0.0000 [IU] | Freq: Three times a day (TID) | SUBCUTANEOUS | Status: DC
  Administered 2014-05-17: 2 [IU] via SUBCUTANEOUS

## 2014-05-16 MED ORDER — SUCCINYLCHOLINE CHLORIDE 20 MG/ML IJ SOLN
INTRAMUSCULAR | Status: AC
  Filled 2014-05-16: qty 1

## 2014-05-16 MED ORDER — CANAGLIFLOZIN 100 MG PO TABS
100.0000 mg | ORAL_TABLET | Freq: Every day | ORAL | Status: DC
  Administered 2014-05-17: 100 mg via ORAL
  Filled 2014-05-16: qty 1

## 2014-05-16 MED ORDER — LACTATED RINGERS IV SOLN
INTRAVENOUS | Status: DC
  Administered 2014-05-16: 11:00:00 via INTRAVENOUS

## 2014-05-16 MED ORDER — CICLOPIROX OLAMINE 0.77 % EX CREA: 1.0000 "application " | TOPICAL_CREAM | Freq: Two times a day (BID) | CUTANEOUS | Status: DC

## 2014-05-16 MED ORDER — PHENYLEPHRINE 40 MCG/ML (10ML) SYRINGE FOR IV PUSH (FOR BLOOD PRESSURE SUPPORT)
PREFILLED_SYRINGE | INTRAVENOUS | Status: AC
  Filled 2014-05-16: qty 10

## 2014-05-16 MED ORDER — HYDROMORPHONE HCL 1 MG/ML IJ SOLN
0.5000 mg | INTRAMUSCULAR | Status: DC | PRN
  Administered 2014-05-17: 1 mg via INTRAVENOUS
  Filled 2014-05-16: qty 1

## 2014-05-16 MED ORDER — LINAGLIPTIN 5 MG PO TABS
5.0000 mg | ORAL_TABLET | Freq: Every day | ORAL | Status: DC
  Administered 2014-05-17: 5 mg via ORAL
  Filled 2014-05-16: qty 1

## 2014-05-16 MED ORDER — EPHEDRINE SULFATE 50 MG/ML IJ SOLN
INTRAMUSCULAR | Status: AC
  Filled 2014-05-16: qty 1

## 2014-05-16 MED ORDER — ONDANSETRON HCL 4 MG/2ML IJ SOLN: 4.0000 mg | Freq: Four times a day (QID) | INTRAMUSCULAR | Status: DC | PRN

## 2014-05-16 MED ORDER — LACTATED RINGERS IV SOLN
INTRAVENOUS | Status: DC | PRN
  Administered 2014-05-16 (×2): via INTRAVENOUS

## 2014-05-16 MED ORDER — AMLODIPINE BESYLATE 10 MG PO TABS
10.0000 mg | ORAL_TABLET | Freq: Every day | ORAL | Status: DC
  Administered 2014-05-17: 10 mg via ORAL
  Filled 2014-05-16: qty 1

## 2014-05-16 MED ORDER — ROCURONIUM BROMIDE 50 MG/5ML IV SOLN
INTRAVENOUS | Status: AC
  Filled 2014-05-16: qty 1

## 2014-05-16 MED ORDER — ONDANSETRON HCL 4 MG/2ML IJ SOLN
INTRAMUSCULAR | Status: AC
Start: 2014-05-16 — End: 2014-05-16
  Filled 2014-05-16: qty 2

## 2014-05-16 MED ORDER — HYOSCYAMINE SULFATE ER 0.375 MG PO TB12
0.3750 mg | ORAL_TABLET | Freq: Two times a day (BID) | ORAL | Status: DC | PRN
  Filled 2014-05-16: qty 1

## 2014-05-16 MED ORDER — METHOCARBAMOL 500 MG PO TABS
ORAL_TABLET | ORAL | Status: AC
  Filled 2014-05-16: qty 1

## 2014-05-16 MED ORDER — MIDAZOLAM HCL 2 MG/2ML IJ SOLN
INTRAMUSCULAR | Status: AC
  Administered 2014-05-16: 1 mg
  Filled 2014-05-16: qty 2

## 2014-05-16 MED ORDER — IRBESARTAN 150 MG PO TABS
150.0000 mg | ORAL_TABLET | Freq: Two times a day (BID) | ORAL | Status: DC
  Administered 2014-05-16 – 2014-05-17 (×2): 150 mg via ORAL
  Filled 2014-05-16 (×3): qty 1

## 2014-05-16 MED ORDER — HYDROCODONE-ACETAMINOPHEN 7.5-325 MG PO TABS
1.0000 | ORAL_TABLET | ORAL | Status: DC | PRN
  Administered 2014-05-16: 1.5 via ORAL
  Administered 2014-05-16 – 2014-05-17 (×4): 2 via ORAL
  Filled 2014-05-16 (×4): qty 2

## 2014-05-16 MED ORDER — AZELASTINE HCL 0.1 % NA SOLN
2.0000 | Freq: Every day | NASAL | Status: DC
  Administered 2014-05-17: 2 via NASAL
  Filled 2014-05-16: qty 30

## 2014-05-16 MED ORDER — METHOCARBAMOL 500 MG PO TABS
500.0000 mg | ORAL_TABLET | Freq: Four times a day (QID) | ORAL | Status: DC | PRN
  Administered 2014-05-16 – 2014-05-17 (×4): 500 mg via ORAL
  Filled 2014-05-16 (×3): qty 1

## 2014-05-16 MED ORDER — FENTANYL CITRATE 0.05 MG/ML IJ SOLN
INTRAMUSCULAR | Status: AC
  Administered 2014-05-16: 100 ug
  Filled 2014-05-16: qty 2

## 2014-05-16 MED ORDER — METOCLOPRAMIDE HCL 10 MG PO TABS: 5.0000 mg | ORAL_TABLET | Freq: Three times a day (TID) | ORAL | Status: DC | PRN

## 2014-05-16 MED ORDER — DOCUSATE SODIUM 100 MG PO CAPS
100.0000 mg | ORAL_CAPSULE | Freq: Two times a day (BID) | ORAL | Status: DC
  Administered 2014-05-16 – 2014-05-17 (×2): 100 mg via ORAL
  Filled 2014-05-16 (×2): qty 1

## 2014-05-16 MED ORDER — ONDANSETRON HCL 4 MG/2ML IJ SOLN
INTRAMUSCULAR | Status: DC | PRN
  Administered 2014-05-16: 4 mg via INTRAVENOUS

## 2014-05-16 MED ORDER — PANTOPRAZOLE SODIUM 40 MG PO TBEC: 80.0000 mg | DELAYED_RELEASE_TABLET | Freq: Every day | ORAL | Status: DC

## 2014-05-16 MED ORDER — HYDROCODONE-ACETAMINOPHEN 7.5-325 MG PO TABS: 1.0000 | ORAL_TABLET | ORAL | Status: DC | PRN

## 2014-05-16 MED ORDER — PROPOFOL 10 MG/ML IV BOLUS
INTRAVENOUS | Status: DC | PRN
  Administered 2014-05-16: 150 mg via INTRAVENOUS

## 2014-05-16 SURGICAL SUPPLY — 69 items
BENZOIN TINCTURE PRP APPL 2/3 (GAUZE/BANDAGES/DRESSINGS) ×2 IMPLANT
BLADE SAW SGTL 83.5X18.5 (BLADE) ×2 IMPLANT
BNDG GAUZE ELAST 4 BULKY (GAUZE/BANDAGES/DRESSINGS) ×2 IMPLANT
BOWL SMART MIX CTS (DISPOSABLE) ×2 IMPLANT
BRUSH FEMORAL CANAL (MISCELLANEOUS) IMPLANT
BUR SURG 4X8 MED (BURR) IMPLANT
BURR SURG 4X8 MED (BURR)
CEMENT BONE SIMPLEX SPEEDSET (Cement) ×2 IMPLANT
COVER MAYO STAND STRL (DRAPES) IMPLANT
COVER SURGICAL LIGHT HANDLE (MISCELLANEOUS) ×2 IMPLANT
DRAPE C-ARM 42X72 X-RAY (DRAPES) IMPLANT
DRAPE INCISE IOBAN 66X45 STRL (DRAPES) ×2 IMPLANT
DRAPE U-SHAPE 47X51 STRL (DRAPES) ×2 IMPLANT
DRSG AQUACEL AG ADV 3.5X10 (GAUZE/BANDAGES/DRESSINGS) ×2 IMPLANT
DRSG PAD ABDOMINAL 8X10 ST (GAUZE/BANDAGES/DRESSINGS) ×2 IMPLANT
DURAPREP 26ML APPLICATOR (WOUND CARE) ×4 IMPLANT
ELECT CAUTERY BLADE 6.4 (BLADE) ×2 IMPLANT
ELECT REM PT RETURN 9FT ADLT (ELECTROSURGICAL) ×2
ELECTRODE REM PT RTRN 9FT ADLT (ELECTROSURGICAL) ×1 IMPLANT
EVACUATOR 1/8 PVC DRAIN (DRAIN) IMPLANT
FACESHIELD WRAPAROUND (MASK) ×4 IMPLANT
GAUZE XEROFORM 5X9 LF (GAUZE/BANDAGES/DRESSINGS) ×2 IMPLANT
GLENOID SELF PRESSURIZING SZ44 (Orthopedic Implant) ×2 IMPLANT
GLOVE BIOGEL PI IND STRL 6.5 (GLOVE) ×1 IMPLANT
GLOVE BIOGEL PI IND STRL 7.0 (GLOVE) ×4 IMPLANT
GLOVE BIOGEL PI INDICATOR 6.5 (GLOVE) ×1
GLOVE BIOGEL PI INDICATOR 7.0 (GLOVE) ×4
GLOVE ECLIPSE 6.5 STRL STRAW (GLOVE) ×8 IMPLANT
GLOVE ORTHO TXT STRL SZ7.5 (GLOVE) ×2 IMPLANT
GLOVE SURG SS PI 6.5 STRL IVOR (GLOVE) ×2 IMPLANT
GOWN STRL REUS W/ TWL LRG LVL3 (GOWN DISPOSABLE) ×4 IMPLANT
GOWN STRL REUS W/TWL 2XL LVL3 (GOWN DISPOSABLE) ×4 IMPLANT
GOWN STRL REUS W/TWL LRG LVL3 (GOWN DISPOSABLE) ×4
HANDPIECE INTERPULSE COAX TIP (DISPOSABLE) ×1
HEAD HUMERAL SNGL RADIUS 19MM (Head) ×2 IMPLANT
KIT BASIN OR (CUSTOM PROCEDURE TRAY) ×2 IMPLANT
KIT ROOM TURNOVER OR (KITS) ×2 IMPLANT
MANIFOLD NEPTUNE II (INSTRUMENTS) ×2 IMPLANT
NEEDLE 1/2 CIR CATGUT .05X1.09 (NEEDLE) ×2 IMPLANT
NEEDLE HYPO 25GX1X1/2 BEV (NEEDLE) ×2 IMPLANT
NS IRRIG 1000ML POUR BTL (IV SOLUTION) ×2 IMPLANT
PACK SHOULDER (CUSTOM PROCEDURE TRAY) ×2 IMPLANT
PAD ARMBOARD 7.5X6 YLW CONV (MISCELLANEOUS) ×4 IMPLANT
PASSER SUT SWANSON 36MM LOOP (INSTRUMENTS) ×2 IMPLANT
SET HNDPC FAN SPRY TIP SCT (DISPOSABLE) ×1 IMPLANT
SLING ARM IMMOBILIZER LRG (SOFTGOODS) ×2 IMPLANT
SLING ARM IMMOBILIZER XL (CAST SUPPLIES) IMPLANT
SPONGE LAP 18X18 X RAY DECT (DISPOSABLE) ×2 IMPLANT
STEM HUMERAL REUNION TSA 128MM (Stem) ×2 IMPLANT
STRIP CLOSURE SKIN 1/2X4 (GAUZE/BANDAGES/DRESSINGS) ×2 IMPLANT
SUCTION FRAZIER TIP 10 FR DISP (SUCTIONS) ×2 IMPLANT
SUT FIBERWIRE #2 38 T-5 BLUE (SUTURE) ×8
SUT MNCRL AB 4-0 PS2 18 (SUTURE) ×2 IMPLANT
SUT MON AB 2-0 CT1 36 (SUTURE) IMPLANT
SUT SILK 2 0 TIES 17X18 (SUTURE) ×1
SUT SILK 2-0 18XBRD TIE BLK (SUTURE) ×1 IMPLANT
SUT VIC AB 0 CT1 27 (SUTURE) ×1
SUT VIC AB 0 CT1 27XBRD ANBCTR (SUTURE) ×1 IMPLANT
SUT VIC AB 2-0 CT1 27 (SUTURE) ×2
SUT VIC AB 2-0 CT1 TAPERPNT 27 (SUTURE) ×2 IMPLANT
SUTURE FIBERWR #2 38 T-5 BLUE (SUTURE) ×4 IMPLANT
SYR CONTROL 10ML LL (SYRINGE) IMPLANT
SYR TOOMEY 50ML (SYRINGE) ×2 IMPLANT
TAPE CLOTH SURG 6X10 WHT LF (GAUZE/BANDAGES/DRESSINGS) ×2 IMPLANT
TOWEL OR 17X24 6PK STRL BLUE (TOWEL DISPOSABLE) ×2 IMPLANT
TOWEL OR 17X26 10 PK STRL BLUE (TOWEL DISPOSABLE) ×2 IMPLANT
TOWER CARTRIDGE SMART MIX (DISPOSABLE) ×2 IMPLANT
TRAY FOLEY CATH 16FRSI W/METER (SET/KITS/TRAYS/PACK) IMPLANT
WATER STERILE IRR 1000ML POUR (IV SOLUTION) ×2 IMPLANT

## 2014-05-16 NOTE — H&P (View-Only) (Signed)
Chief complaint: HPI.  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  Returns for follow up.  Symptoms are unchanged from previous visits and she wishes to proceed with right total shoulder replacement as scheduled.  She has significant pain with activity that is affecting her quality of life and ability to perform ADLs.  She has failed to respond to conservative treatment options including intraarticular Cortisone injection.  Current medications: Crestor, Avapro, Doxazosin, Januvia, Amlodipine, Metformin, Xanax, Furosemide, Hydrocodone, Toprol, Levothyroxine, Aspirin, fish oil, Synthroid and Thymine factor. Allergies: Cortisone injections (increased her blood pressure). Past medical history: Significant for vision change, glasses, dysphagia, hoarseness, dentures, high blood pressure, aortic stenosis, aortic stenotic heart murmur, diarrhea, constipation, autoimmune hepatitis, hemorrhoids, colitis, hypothyroidism, diabetes, ankle swelling, bladder infection, frequent urination, night urination, headaches and nervous tension.  Past surgical history: Significant for hysterectomy in 1980, left total knee replacement, left total shoulder replacement, thyroidectomy in 2010, laryngoplasty in 2010 and bilateral knee arthroscopies in 2010. Review of systems: Patient currently denies lightheadedness, dizziness, fevers, chills, chest pain, pressure, palpitations, shortness of breath, or any GI, GU or neuro issues.  Family history: Significant for heart disease in brother and sister, heart attack in sister, high blood pressure in brother and sister, stroke in brother and cancer in her mother and brother.   Social history: Does not smoke or drink alcoholic beverages.  She is married and lives with her husband.      EXAMINATION: Height: 5?3.  Weight: 203 pounds.  Respirations: 24.  Blood pressure: 149/57.  Pulse: 57.    Temperature: 97.8 F.  She  is alert and oriented x 3.  No acute distress.  She does have an antalgic gait with a Trendelenburg component on the right.  Head is normocephalic, a traumatic.  EOMI, PERRLA.  Neck: Unremarkable.  Lungs are clear to auscultation bilaterally.  No wheezes, rales or rhonchi.  Heart has a regular rate with an aortic stenotic murmur.  Abdomen is soft and non-tender with normal active bowel sounds x 4.  Calves are non-tender bilaterally.  Neuro: Grossly intact in bilateral upper and lower extremities.  Skin is warm and dry.  Examination of her right shoulder reveals less than 50% of active and passive motion.  Marked grating and crepitus.  I think her cuff is intact.  She is neurovascularly intact distally.    X-RAYS: Previous imaging of her right shoulder demonstrates severe end stage degenerative joint changes.   IMPRESSION: Right shoulder end stage degenerative joint changes and chronic pain which has failed to respond to conservative treatment options.     DISPOSITION:  We will proceed with right total shoulder replacement as scheduled.  Risks, benefits and possible complications of surgery have been discussed.  Rehab and recovery time discussed as well.  All questions were answered.  Oretha Caprice, PA-C

## 2014-05-16 NOTE — Progress Notes (Signed)
Report given to [facility] rn as caregiver

## 2014-05-16 NOTE — Interval H&P Note (Signed)
History and Physical Interval Note:  05/16/2014 8:36 AM  Hailey Cobb  has presented today for surgery, with the diagnosis of djd right shoulder  The various methods of treatment have been discussed with the patient and family. After consideration of risks, benefits and other options for treatment, the patient has consented to  Procedure(s): TOTAL SHOULDER ARTHROPLASTY (Right) as a surgical intervention .  The patient's history has been reviewed, patient examined, no change in status, stable for surgery.  I have reviewed the patient's chart and labs.  Questions were answered to the patient's satisfaction.     Phallon Haydu F

## 2014-05-16 NOTE — Anesthesia Postprocedure Evaluation (Signed)
Anesthesia Post Note  Patient: Hailey Cobb  Procedure(s) Performed: Procedure(s) (LRB): TOTAL SHOULDER ARTHROPLASTY (Right)  Anesthesia type: general  Patient location: PACU  Post pain: Pain level controlled  Post assessment: Patient's Cardiovascular Status Stable  Last Vitals:  Filed Vitals:   05/16/14 1515  BP: 129/48  Pulse: 53  Temp:   Resp: 20    Post vital signs: Reviewed and stable  Level of consciousness: sedated  Complications: No apparent anesthesia complications

## 2014-05-16 NOTE — Anesthesia Procedure Notes (Signed)
Anesthesia Regional Block:  Interscalene brachial plexus block  Pre-Anesthetic Checklist: ,, timeout performed, Correct Patient, Correct Site, Correct Laterality, Correct Procedure, Correct Position, site marked, Risks and benefits discussed,  Surgical consent,  Pre-op evaluation,  At surgeon's request and post-op pain management  Laterality: Right  Prep: chloraprep       Needles:  Injection technique: Single-shot  Needle Type: Echogenic Stimulator Needle     Needle Length: 5cm 5 cm Needle Gauge: 22 and 22 G    Additional Needles:  Procedures: ultrasound guided (picture in chart) and nerve stimulator Interscalene brachial plexus block  Nerve Stimulator or Paresthesia:  Response: bicep contraction, 0.45 mA,   Additional Responses:   Narrative:  Start time: 05/16/2014 12:08 PM End time: 05/16/2014 12:18 PM Injection made incrementally with aspirations every 5 mL.  Performed by: Personally  Anesthesiologist: J. Tamela Gammon, MD  Additional Notes: Functioning IV was confirmed and monitors applied.  A 31mm 22ga echogenic arrow stimulator was used. Sterile prep and drape,hand hygiene and sterile gloves were used.Ultrasound guidance: relevant anatomy identified, needle position confirmed, local anesthetic spread visualized around nerve(s)., vascular puncture avoided.  Image printed for medical record.  Negative aspiration and negative test dose prior to incremental administration of local anesthetic. The patient tolerated the procedure well.

## 2014-05-16 NOTE — Anesthesia Preprocedure Evaluation (Addendum)
Anesthesia Evaluation  Patient identified by MRN, date of birth, ID band Patient awake    Reviewed: Allergy & Precautions, H&P , NPO status , Patient's Chart, lab work & pertinent test results, reviewed documented beta blocker date and time   Airway Mallampati: III TM Distance: >3 FB Neck ROM: Full  Mouth opening: Limited Mouth Opening  Dental  (+) Partial Upper, Partial Lower   Pulmonary    Pulmonary exam normal       Cardiovascular hypertension, Pt. on medications and Pt. on home beta blockers + Valvular Problems/Murmurs     Neuro/Psych Anxiety    GI/Hepatic GERD-  ,(+) Hepatitis -  Endo/Other  diabetes, Type 2, Oral Hypoglycemic AgentsHypothyroidism   Renal/GU      Musculoskeletal   Abdominal   Peds  Hematology   Anesthesia Other Findings   Reproductive/Obstetrics                          Anesthesia Physical Anesthesia Plan  ASA: III  Anesthesia Plan: General   Post-op Pain Management:    Induction: Intravenous  Airway Management Planned: Oral ETT  Additional Equipment:   Intra-op Plan:   Post-operative Plan: Extubation in OR  Informed Consent: I have reviewed the patients History and Physical, chart, labs and discussed the procedure including the risks, benefits and alternatives for the proposed anesthesia with the patient or authorized representative who has indicated his/her understanding and acceptance.   Dental advisory given  Plan Discussed with: CRNA, Anesthesiologist and Surgeon  Anesthesia Plan Comments:         Anesthesia Quick Evaluation

## 2014-05-16 NOTE — Discharge Instructions (Signed)
Shoulder Joint Replacement, Care After Refer to this sheet in the next few weeks. These instructions provide you with information on caring for yourself after your procedure. Your health care provider may also give you more specific instructions. Your treatment has been planned according to current medical practices, but problems sometimes occur. Call your health care provider if you have any problems or questions after your procedure. WHAT TO EXPECT AFTER THE PROCEDURE After your procedure, your arm and shoulder will typically be stiff and bruised. This will improve over time. HOME CARE INSTRUCTIONS   Wear your sling at all times for the next 6 weeks.  May change bandage in 3 days.  May shower in 5 days, but do not soak incision.  May apply ice for up to 20 minutes at a time for pain and swelling.  Follow up appointment in one week.   You may resume your normal diet and activities as directed by your surgeon.  You should regain full use of your shoulder in 6 weeks.  Your arm will be in a sling. You will need to wear this for 4-6 weeks after surgery.  Wear the sling every night for at least the first month, or as instructed by your surgeon.  Do not use your arm to push yourself up in bed or from a chair. This requires too much muscle.  Follow the program of home exercises suggested. Do the exercises 4-5 times a day for a month or as directed.  Try not to overuse your shoulder. Overusing the shoulder is easy to do if this is the first time you have been pain free in a long time. Early overuse of the shoulder may result in later problems.  Do not lift anything heavier than a cup of coffee for the first 6 weeks after surgery.  Ask for help. Your health care provider may be able to suggest a clinic or agency for this if you do not have home support.  Do not participate in contact sports or do any heavy lifting (more than 10 lb [4.5 kg]) for at least 6 months, or as directed.  Apply ice to  the injured area for the first 2 days after surgery:  Put ice in a plastic bag.  Place a towel between your skin and the bag.  Leave the ice on for 20 minutes, 2-3 times a day.  Change dressings if necessary or as directed.  Only take over-the-counter or prescription medicines for pain, discomfort, or fever as directed by your health care provider.  Keep all follow-up appointments as directed. SEEK MEDICAL CARE IF:  You have redness, swelling, or increasing pain in the wound.  You see pus coming from the wound.  You have a fever.  You notice a bad smell coming from the wound or dressing.  The edges of the wound break open after sutures or staples have been removed.  You have increasing pain with movement of the shoulder. SEEK IMMEDIATE MEDICAL CARE IF:   You develop a rash.  You have chest pain or shortness of breath.  You have any reaction or side effects to medicine given. MAKE SURE YOU:  Understand these instructions.  Will watch your condition.  Will get help right away if you are not doing well or get worse. Document Released: 02/20/2005 Document Revised: 08/08/2013 Document Reviewed: 03/02/2013 [facility] Patient Information 2015 [place], [place]. This information is not intended to replace advice given to you by your health care provider. Make sure you discuss any  questions you have with your health care provider.

## 2014-05-16 NOTE — Transfer of Care (Signed)
Immediate Anesthesia Transfer of Care Note  Patient: Hailey Cobb  Procedure(s) Performed: Procedure(s): TOTAL SHOULDER ARTHROPLASTY (Right)  Patient Location: PACU  Anesthesia Type:GA combined with regional for post-op pain  Level of Consciousness: awake, alert  and oriented  Airway & Oxygen Therapy: Patient Spontanous Breathing and Patient connected to face mask oxygen  Post-op Assessment: Report given to PACU RN  Post vital signs: Reviewed and stable  Complications: No apparent anesthesia complications

## 2014-05-16 NOTE — Discharge Summary (Addendum)
Patient ID: Hailey Cobb MRN: [id number] DOB/AGE: 04-03-1932 80 y.o.  Admit date: 05/16/2014 Discharge date: 05/17/2014  Admission Diagnoses:  Active Problems:   Arthritis of shoulder region, right   Discharge Diagnoses:  Same  Past Medical History  Diagnosis Date  . Hypertension   . Diabetes mellitus without complication     Type 2   . Osteoarthritis   . S/P dilatation of esophageal stricture   . Unilateral vocal cord paralysis   . Mucus pooling in larynx     Begin after having thyroidectomy; pt takes Mucus medication nightly  . Shortness of breath     with exertion  . Anxiety   . Hypothyroidism   . GERD (gastroesophageal reflux disease)   . Hepatitis     Autoimmune   . IBS (irritable bowel syndrome)   . Hypercholesteremia   . Vitiligo   . Heart murmur     mild AS by 01/2010 echo [facility])    Surgeries: Procedure(s): TOTAL SHOULDER ARTHROPLASTY on 05/16/2014   Consultants:    Discharged Condition: Improved  Hospital Course: Hailey Cobb is an 79 y.o. female who was admitted 05/16/2014 for operative treatment of right shoulder end stage degenerative joint disease. Patient has severe unremitting pain that affects sleep, daily activities, and work/hobbies. After pre-op clearance the patient was taken to the operating room on 05/16/2014 and underwent  Procedure(s): TOTAL SHOULDER ARTHROPLASTY.  Hailey Cobb has not had any post-op complications and will be discharged today.  Patient was given perioperative antibiotics:     Anti-infectives   Start     Dose/Rate Route Frequency Ordered Stop   05/16/14 2000  ceFAZolin (ANCEF) IVPB 2 g/50 mL premix     2 g 100 mL/hr over 30 Minutes Intravenous Every 6 hours 05/16/14 1748 05/17/14 1359   05/16/14 0600  ceFAZolin (ANCEF) IVPB 2 g/50 mL premix     2 g 100 mL/hr over 30 Minutes Intravenous On call to O.R. 05/15/14 1341 05/16/14 1240       Patient was given sequential compression devices, early ambulation, and chemoprophylaxis to  prevent DVT.  Patient benefited maximally from hospital stay and there were no complications.    Recent vital signs:  Patient Vitals for the past 24 hrs:  BP Temp Temp src Pulse Resp SpO2 Height Weight  05/17/14 0457 131/53 mmHg 99 F (37.2 C) - 72 20 95 % - -  05/17/14 0212 128/58 mmHg 98.3 F (36.8 C) - 60 20 95 % - -  05/16/14 2112 132/62 mmHg 98.4 F (36.9 C) - 62 20 95 % - -  05/16/14 1715 130/54 mmHg 98.2 F (36.8 C) - 60 25 95 % - -  05/16/14 1645 - 97.8 F (36.6 C) - 61 20 95 % - -  05/16/14 1630 124/53 mmHg - - 61 19 94 % - -  05/16/14 1615 135/49 mmHg - - 59 24 95 % - -  05/16/14 1600 120/62 mmHg - - 57 23 94 % - -  05/16/14 1545 130/48 mmHg - - 56 23 93 % - -  05/16/14 1530 119/49 mmHg - - 56 22 92 % - -  05/16/14 1515 129/48 mmHg - - 53 20 92 % - -  05/16/14 1500 - - - 59 20 93 % - -  05/16/14 1445 123/49 mmHg - - 56 - - - -  05/16/14 1430 117/53 mmHg 98 F (36.7 C) - 58 20 97 % - -  05/16/14 1210 - - - 70 15  99 % - -  05/16/14 1102 187/70 mmHg 97 F (36.1 C) Oral 63 20 97 % 5\' 4"  (1.626 m) 89.359 kg (197 lb)     Recent laboratory studies: No results found for this basename: WBC, HGB, HCT, PLT, NA, K, CL, CO2, BUN, CREATININE, GLUCOSE, PT, INR, CALCIUM, 2,  in the last 72 hours   Discharge Medications:     Medication List    STOP taking these medications       FISH OIL PO     VICODIN 5-500 MG per tablet  Generic drug:  HYDROcodone-acetaminophen  Replaced by:  HYDROcodone-acetaminophen 7.5-325 MG per tablet      TAKE these medications       ALPRAZolam 0.5 MG tablet  Commonly known as:  XANAX  Take 0.5 mg by mouth at bedtime as needed for anxiety.     amLODipine 10 MG tablet  Commonly known as:  NORVASC  Take 10 mg by mouth daily.     ANTIOXIDANT PO  Take 1 tablet by mouth daily.     aspirin 81 MG tablet  Take 81 mg by mouth daily.     azelastine 0.1 % nasal spray  Commonly known as:  ASTELIN  Place 2 sprays into both nostrils daily. Use in  each nostril as directed     bisacodyl 5 MG EC tablet  Commonly known as:  DULCOLAX  Take 1 tablet (5 mg total) by mouth daily as needed for moderate constipation.     ciclopirox 0.77 % cream  Commonly known as:  LOPROX  Apply 1 application topically 2 (two) times daily.     doxazosin 2 MG tablet  Commonly known as:  CARDURA  Take 2 mg by mouth 2 (two) times daily.     esomeprazole 40 MG capsule  Commonly known as:  NEXIUM  Take 40 mg by mouth daily at 12 noon.     furosemide 40 MG tablet  Commonly known as:  LASIX  Take 20 mg by mouth daily.     HYDROcodone-acetaminophen 7.5-325 MG per tablet  Commonly known as:  NORCO  Take 1-2 tablets by mouth every 4 (four) hours as needed for moderate pain.     hyoscyamine 0.375 MG 12 hr tablet  Commonly known as:  LEVBID  Take 0.375 mg by mouth every 12 (twelve) hours as needed (IBS symptoms).     INVOKANA 100 MG Tabs  Generic drug:  Canagliflozin  Take 100 mg by mouth daily.     irbesartan 150 MG tablet  Commonly known as:  AVAPRO  Take 150 mg by mouth 2 (two) times daily.     levothyroxine 125 MCG tablet  Commonly known as:  SYNTHROID, LEVOTHROID  Take 125 mcg by mouth daily before breakfast.     metFORMIN 1000 MG tablet  Commonly known as:  GLUCOPHAGE  Take 1,000 mg by mouth 2 (two) times daily with a meal.     metoprolol succinate 50 MG 24 hr tablet  Commonly known as:  TOPROL-XL  Take 50 mg by mouth 2 (two) times daily. Take with or immediately following a meal.     ondansetron 4 MG tablet  Commonly known as:  ZOFRAN  Take 1 tablet (4 mg total) by mouth every 8 (eight) hours as needed for nausea or vomiting.     rosuvastatin 20 MG tablet  Commonly known as:  CRESTOR  Take 20 mg by mouth daily.     sitaGLIPtin 100 MG tablet  Commonly known  as:  JANUVIA  Take 100 mg by mouth daily.        Diagnostic Studies: Dg Chest 2 View  05/07/2014   CLINICAL DATA:  Preop for right shoulder surgery.  EXAM: CHEST  2 VIEW   COMPARISON:  10/03/2008.  FINDINGS: The cardiac silhouette, mediastinal and hilar contours are within normal limits and stable. Mild tortuosity and calcification of the thoracic aorta is noted. The lungs demonstrate mild chronic bronchitic changes but no infiltrates, edema or effusions. The bony thorax is intact. A prosthetic left humeral head is noted along with remote thyroidectomy changes.  IMPRESSION: Mild chronic bronchitic changes but no acute pulmonary findings.   Electronically Signed   By: Kalman Jewels M.D.   On: 05/07/2014 14:03   Mm Digital Screening Bilateral  04/19/2014   CLINICAL DATA:  Screening.  EXAM: DIGITAL SCREENING BILATERAL MAMMOGRAM WITH CAD  COMPARISON:  None.  ACR Breast Density Category b: There are scattered areas of fibroglandular density.  FINDINGS: There are no findings suspicious for malignancy. Images were processed with CAD.  IMPRESSION: No mammographic evidence of malignancy. A result letter of this screening mammogram will be mailed directly to the patient.  RECOMMENDATION: Screening mammogram in one year. (Code:[id number]-B-01Y)  BI-RADS CATEGORY  1: Negative.   Electronically Signed   By: Conchita Paris M.D.   On: 04/19/2014 15:26    Disposition:     Follow-up Information   Follow up with [facility], MD. Schedule an appointment as soon as possible for a visit in 1 week.   Specialty:  Orthopedic Surgery   Contact information:   [address] [place] [address] [phone]        Signed: Larae Cobb 05/17/2014, 6:20 AM

## 2014-05-16 NOTE — Progress Notes (Signed)
Utilization review completed.  

## 2014-05-17 ENCOUNTER — Encounter (HOSPITAL_COMMUNITY): Payer: Self-pay | Admitting: Orthopedic Surgery

## 2014-05-17 LAB — GLUCOSE, CAPILLARY
Glucose-Capillary: 159 mg/dL — ABNORMAL HIGH (ref 70–99)
Glucose-Capillary: 159 mg/dL — ABNORMAL HIGH (ref 70–99)

## 2014-05-17 NOTE — Progress Notes (Signed)
Occupational Therapy Treatment Patient Details Name: Hailey Cobb MRN: [id number] DOB: 30-May-1932 Today's Date: 05/17/2014    History of present illness Hailey Cobb is an 80 y.o. Female s/p Rt TSA on 05/16/14.    OT comments  Pt seen for ADL session to complete training for compensatory techniques. Pt reports that her husband will be able to assist minimally. Feel that pt would benefit from [facility] to assist with ADLs while pt recovers due to limited assist available from husband and discussed with CM.    Follow Up Recommendations  Other (comment) ([name] (to assist with ADLs))    Equipment Recommendations  None recommended by OT    Recommendations for Other Services      Precautions / Restrictions Precautions Precautions: Shoulder;Fall Type of Shoulder Precautions: No shoulder or elbow ROM; wrist and hand ONLY Shoulder Interventions: Shoulder sling/immobilizer;At all times (off for dressing; shower in slinged position or with old sli) Precaution Booklet Issued: Yes (comment) Precaution Comments: Educated pt on shoulder precautions and incorporating into ADLs.  Required Braces or Orthoses: Sling Restrictions Weight Bearing Restrictions: Yes RUE Weight Bearing: Non weight bearing       Mobility Bed Mobility Overal bed mobility: Needs Assistance Bed Mobility: Supine to Sit     Supine to sit: Min guard;HOB elevated Sit to supine: Min guard;HOB elevated   General bed mobility comments: Pt unable to get OOB without use of bedrails. VC's for sequencing. Discussed rolling to Lt side then sitting up, however pt did not want to practice at this time.   Transfers Overall transfer level: Needs assistance Equipment used: None Transfers: Sit to/from Stand Sit to Stand: Supervision         General transfer comment: To be assessed.         ADL Overall ADL's : Needs assistance/impaired Eating/Feeding: Set up;Sitting   Grooming: Min guard;Standing   Upper Body Bathing:  Sitting;Minimal assitance Upper Body Bathing Details (indicate cue type and reason): pt instructed to keep R arm in "slinged" psotion for showers or to wear an old sling during shower for safety.  Lower Body Bathing: Min guard;Sit to/from stand   Upper Body Dressing : Maximal assistance;Sitting Upper Body Dressing Details (indicate cue type and reason): including sling Lower Body Dressing: Minimal assistance;Sit to/from stand Lower Body Dressing Details (indicate cue type and reason): assist to pull underwear up around hips Toilet Transfer: Min guard;Ambulation   Toileting- Clothing Manipulation and Hygiene: Min guard;Sit to/from stand       Functional mobility during ADLs: Min guard General ADL Comments: Encouraged pt to use SPC during ambulation for increased safety and stability at home, with use in Lt UE. Pt practiced UB dressing using compensatory techniques and required assist. Pt's husband is able to provide some assist at home, but feel that pt will need increased assist in form of [name].                 Cognition  Arousal/Alertness: Awake/Alert Behavior During Therapy: WFL for tasks assessed/performed Overall Cognitive Status: Within Functional Limits for tasks assessed                       Extremity/Trunk Assessment  Upper Extremity Assessment Upper Extremity Assessment: RUE deficits/detail RUE Deficits / Details: No ROM for shoulder or elbow.  RUE: Unable to fully assess due to immobilization RUE Coordination: decreased gross motor            Exercises Other Exercises Other Exercises: Educated pt  on moving wrist and hand throughout the day.  Donning/doffing shirt without moving shoulder: Maximal assistance Method for sponge bathing under operated UE: Minimal assistance Donning/doffing sling/immobilizer: Maximal assistance Correct positioning of sling/immobilizer: Maximal assistance ROM for elbow, wrist and digits of operated UE:  Supervision/safety Sling wearing schedule (on at all times/off for ADL's): Supervision/safety Proper positioning of operated UE when showering: Supervision/safety Positioning of UE while sleeping: Supervision/safety   Shoulder Instructions Shoulder Instructions Donning/doffing shirt without moving shoulder: Maximal assistance Method for sponge bathing under operated UE: Minimal assistance Donning/doffing sling/immobilizer: Maximal assistance Correct positioning of sling/immobilizer: Maximal assistance ROM for elbow, wrist and digits of operated UE: Supervision/safety Sling wearing schedule (on at all times/off for ADL's): Supervision/safety Proper positioning of operated UE when showering: Supervision/safety Positioning of UE while sleeping: Supervision/safety          Pertinent Vitals/ Pain       Pain Assessment: No/denies pain Pain Score: 0-No pain  Home Living Family/patient expects to be discharged to:: Private residence Living Arrangements: Spouse/significant other Available Help at Discharge: Family;Available 24 hours/day (but husband had stroke in April) Type of Home: House Home Access: Stairs to enter [employer] of Steps: 3 Entrance Stairs-Rails: Right;Left;Can reach both Home Layout: One level     Bathroom Shower/Tub: Tub/shower unit Shower/tub characteristics: Architectural technologist: Standard     Home Equipment: Grab bars - tub/shower;Cane - single point          Prior Functioning/Environment Level of Independence: Needs assistance  Gait / Transfers Assistance Needed: pt occasionally uses SPC for ambulation (rocky terrain, etc) ADL's / Homemaking Assistance Needed: Husband helped pt to get dressed. Pt was independent otherwise.       Frequency Min 1X/week     Progress Toward Goals  OT Goals(current goals can now be found in the care plan section)  Progress towards OT goals: Progressing toward goals  Acute Rehab OT Goals Patient Stated  Goal: to get back home to my husband OT Goal Formulation: With patient Time For Goal Achievement: 05/31/14 Potential to Achieve Goals: Good ADL Goals Pt Will Perform Grooming: with supervision;standing Pt Will Perform Upper Body Dressing: with min assist;sitting (including sling) Pt Will Transfer to Toilet: with supervision;ambulating Pt Will Perform Toileting - Clothing Manipulation and hygiene: with supervision;sit to/from stand  Plan Discharge plan needs to be updated       End of Session Equipment Utilized During Treatment: Other (comment) (sling)   Activity Tolerance Patient tolerated treatment well   Patient Left in chair;with call bell/phone within reach   Nurse Communication Other (comment) (ADL session complete, CM working on [facility] services for Pt)        Time: 3094-0768 OT Time Calculation (min): 24 min  Charges: OT General Charges $OT Visit: 1 Procedure OT Evaluation $Initial OT Evaluation Tier I: 1 Procedure OT Treatments $Self Care/Home Management : 23-37 mins  Juluis Rainier 05/17/2014, 11:05 AM  Cyndie Chime, OTR/L Occupational Therapist [phone] (pager)

## 2014-05-17 NOTE — Progress Notes (Signed)
Subjective: 1 Day Post-Op Procedure(s) (LRB): TOTAL SHOULDER ARTHROPLASTY (Right) Patient reports pain as 3 on 0-10 scale.  No nausea/vomiting.  Occasional lightheadedness/dizziness when arising.  No flatus and no bm as of yet.  Tolerating diet.  Objective: Vital signs in last 24 hours: Temp:  [97 F (36.1 C)-99 F (37.2 C)] 99 F (37.2 C) (10/01 0457) Pulse Rate:  [53-72] 72 (10/01 0457) Resp:  [15-25] 20 (10/01 0457) BP: (117-187)/(48-70) 131/53 mmHg (10/01 0457) SpO2:  [92 %-99 %] 95 % (10/01 0457) Weight:  [89.359 kg (197 lb)] 89.359 kg (197 lb) (09/30 1102)  Intake/Output from previous day: 09/30 0701 - 10/01 0700 In: 1100 [I.V.:1100] Out: 150 [Blood:150] Intake/Output this shift:    No results found for this basename: HGB,  in the last 72 hours No results found for this basename: WBC, RBC, HCT, PLT,  in the last 72 hours No results found for this basename: NA, K, CL, CO2, BUN, CREATININE, GLUCOSE, CALCIUM,  in the last 72 hours No results found for this basename: LABPT, INR,  in the last 72 hours  Neurologically intact Neurovascular intact Sensation intact distally Intact pulses distally Dorsiflexion/Plantar flexion intact Compartment soft   Assessment/Plan: 1 Day Post-Op Procedure(s) (LRB): TOTAL SHOULDER ARTHROPLASTY (Right) Advance diet D/C IV fluids Discharge home with home health NWB RUE Sling at all times [place], M. LINDSEY 05/17/2014, 6:17 AM

## 2014-05-17 NOTE — Progress Notes (Signed)
Occupational Therapy Evaluation Patient Details Name: Hailey Cobb MRN: [id number] DOB: 14-Jun-1932 Today's Date: 05/17/2014    History of Present Illness Hailey Cobb is an 80 y.o. Female s/p Rt TSA on 05/16/14.    Clinical Impression   PTA pt lived at home and needed assistance from her husband to get dressed. Pt used SPC occasionally when ambulating outdoors. Pt reports that her husband is able to assist with ADLs, however he has memory deficits due to stroke and dementia. Pt educated on precautions and compensatory techniques. OT will plan follow up visit today following completion of IV antibiotic to practice UB dressing and sling management. Pt will benefit from acute OT to increase independence with UB ADLs prior to return home.     Follow Up Recommendations  No OT follow up;Supervision/Assistance - 24 hour    Equipment Recommendations  None recommended by OT    Recommendations for Other Services       Precautions / Restrictions Precautions Precautions: Shoulder;Fall Type of Shoulder Precautions: No shoulder or elbow ROM; wrist and hand ONLY Shoulder Interventions: Shoulder sling/immobilizer;At all times (off for dressing; shower in "slinged" position or with old s) Precaution Booklet Issued: Yes (comment) Precaution Comments: Educated pt on shoulder precautions and incorporating into ADLs.  Required Braces or Orthoses: Sling Restrictions Weight Bearing Restrictions: No RUE Weight Bearing: Non weight bearing      Mobility Bed Mobility Overal bed mobility: Needs Assistance Bed Mobility: Supine to Sit;Sit to Supine     Supine to sit: Min guard;HOB elevated Sit to supine: Min guard;HOB elevated   General bed mobility comments: Pt moved to EOB on Lt side with HOB elevated and use of hand rail with Min guard for safety and shoulder precautions.   Transfers                 General transfer comment: To be assessed.          ADL Overall ADL's : Needs  assistance/impaired Eating/Feeding: Set up;Sitting   Grooming: Set up;Sitting           Upper Body Dressing : Maximal assistance;Sitting Upper Body Dressing Details (indicate cue type and reason): including sling                   General ADL Comments: Provided education regarding shoulder precautions and compensatory techniques for UB bathing. Educated pt on no shoulder ROM and moving wrist and hand throughout the day, use of sling 24/7 except when dressing. Pt is completing IV antibiotic and OT will plan to return to complete ADL session to practice compensatory techniques.      Vision  Pt reports no change from baseline.                    Perception Perception Perception Tested?: No   Praxis Praxis Praxis tested?: Within functional limits    Pertinent Vitals/Pain Pain Assessment: No/denies pain Pain Score: 0-No pain     Hand Dominance Right   Extremity/Trunk Assessment Upper Extremity Assessment Upper Extremity Assessment: RUE deficits/detail RUE Deficits / Details: No ROM for shoulder or elbow.  RUE: Unable to fully assess due to immobilization RUE Coordination: decreased gross motor           Communication Communication Communication: No difficulties   Cognition Arousal/Alertness: Awake/alert Behavior During Therapy: WFL for tasks assessed/performed Overall Cognitive Status: Within Functional Limits for tasks assessed  Exercises Exercises: Other exercises;Shoulder Other Exercises Other Exercises: Educated pt on moving wrist and hand throughout the day.    Shoulder Instructions Shoulder Instructions Donning/doffing shirt without moving shoulder:  (pt educated) Method for sponge bathing under operated UE:  (pt educated) Correct positioning of sling/immobilizer: Maximal assistance ROM for wrist and digits of operated UE: Supervision/safety Sling wearing schedule (on at all times/off for ADL's):  Supervision/safety Proper positioning of operated UE when showering: Supervision/safety Positioning of UE while sleeping: [place] expects to be discharged to:: Private residence Living Arrangements: Spouse/significant other Available Help at Discharge: Family;Available 24 hours/day (but husband had stroke in April) Type of Home: House Home Access: Stairs to enter [employer] of Steps: 3 Entrance Stairs-Rails: Right;Left;Can reach both Home Layout: One level     Bathroom Shower/Tub: Tub/shower unit Shower/tub characteristics: Architectural technologist: Standard     Home Equipment: Grab bars - tub/shower;Cane - single point          Prior Functioning/Environment Level of Independence: Needs assistance  Gait / Transfers Assistance Needed: pt occasionally uses SPC for ambulation (rocky terrain, etc) ADL's / Homemaking Assistance Needed: Husband helped pt to get dressed. Pt was independent otherwise.        OT Diagnosis: Acute pain;Generalized weakness   OT Problem List: Decreased strength;Decreased range of motion;Decreased knowledge of precautions;Impaired UE functional use   OT Treatment/Interventions: Self-care/ADL training;Therapeutic exercise;Energy conservation;Therapeutic activities;Patient/family education;Balance training    OT Goals(Current goals can be found in the care plan section) Acute Rehab OT Goals Patient Stated Goal: to get back home to my husband OT Goal Formulation: With patient Time For Goal Achievement: 05/31/14 Potential to Achieve Goals: Good ADL Goals Pt Will Perform Grooming: with supervision;standing Pt Will Perform Upper Body Dressing: with min assist;sitting (including sling) Pt Will Transfer to Toilet: with supervision;ambulating Pt Will Perform Toileting - Clothing Manipulation and hygiene: with supervision;sit to/from stand  OT Frequency: Min 1X/week   Barriers to D/C: Decreased caregiver  support  Pt lives with husband, who has hx of dementia and recent stroke in April. Husband is able to assist with ADLs, however has memory deficits.        Co-evaluation              End of Session Equipment Utilized During Treatment: Other (comment) (sling) Nurse Communication: Other (comment);Precautions;Weight bearing status (ADL session after IV is complete)  Activity Tolerance: Patient tolerated treatment well Patient left: in bed;with call bell/phone within reach   Time: 0821-0859 OT Time Calculation (min): 38 min Charges:  OT General Charges $OT Visit: 1 Procedure OT Evaluation $Initial OT Evaluation Tier I: 1 Procedure OT Treatments $Self Care/Home Management : 23-37 mins  Juluis Rainier 05/17/2014, 9:10 AM  Cyndie Chime, OTR/L Occupational Therapist [phone] (pager)

## 2014-05-17 NOTE — Op Note (Signed)
Hailey Cobb, Hailey Cobb NO.:  [id number]  MEDICAL RECORD NO.:  [id number]  LOCATION:  5N30C                        FACILITY:  [facility]  PHYSICIAN:  Ninetta Lights, M.D. DATE OF BIRTH:  Jan 19, 1932  DATE OF PROCEDURE:  05/16/2014 DATE OF DISCHARGE:                              OPERATIVE REPORT   PREOPERATIVE DIAGNOSES:  Right shoulder end-stage degenerative arthritis.  Marked erosive changes.  Loose bodies.  Marked adhesive capsulitis.  POSTOPERATIVE DIAGNOSES:  Right shoulder end-stage degenerative arthritis.  Marked erosive changes.  Loose bodies.  Marked adhesive capsulitis.  PROCEDURE:  Right shoulder exam manipulation under anesthesia.  Total shoulder replacement utilizing Stryker prosthesis.  Lysis debridement of adhesions.  Removal of loose bodies.  A cemented pegged 44-mm glenoid component.  A press-fit 11-mm stem with a 44 x 19 head.  SURGEON:  Ninetta Lights, MD  ASSISTANT:  Doran Stabler, PA present throughout the entire case and necessary for timely completion of procedure.  ANESTHESIA:  General.  BLOOD LOSS:  Less than 100 mL.  BLOOD GIVEN:  None.  SPECIMENS:  None.  CULTURES:  None.  COMPLICATIONS:  None.  DRESSINGS:  Soft compressive shoulder immobilizer.  DESCRIPTION OF PROCEDURE:  The patient was brought to the operating room and after adequate anesthesia had been obtained, placed in a beach-chair position on the shoulder positioner, and prepped and draped in usual sterile fashion.  Less than 50% motion.  This was improved to about 75% with soft tissue manipulation.  Deltopectoral incision.  Skin and subcutaneous tissue divided.  Deltopectoral interval opened.  Retractor put in place.  Marked adhesions cleared throughout.  Subscap taken down and retracted medially.  Shoulder exposed.  Effusion drained.  Numerous loose bodies periarticular spurs removed.  Humerus was cut along the surgical neck at 30 degrees of retroversion.   Retractors put in place. Glenoid exposed.  Spurs removed.  With appropriate reamers brought up to good bleeding bone incising.  Drilled, sized, and fitted for a 4 pegged cemented 44-mm component.  Once that was firmly cemented, attention was turned to the humerus.  Handheld reamers and rasps.  Brought up to good fitting with a #11 component placed at 30 degrees of retroversion.  When the stem was seated, I then utilized a 44 x 19 head which was nicely centered with a good coverage.  This reduce.  Full passive motion, excellent stability.  Biceps and rotator cuff were intact.  Subscap repaired anatomically with FiberWire.  Deltopectoral interval closed. Subcutaneous and subcuticular closure.  Margins were injected with Marcaine.  Sterile compressive dressing applied.  Shoulder immobilizer applied.  Anesthesia reversed.  Brought to the recovery room.  Tolerated the surgery well.  No complications.     Ninetta Lights, M.D.     DFM/MEDQ  D:  05/16/2014  T:  05/17/2014  Job:  [id number]

## 2014-05-22 DIAGNOSIS — Z4789 Encounter for other orthopedic aftercare: Secondary | ICD-10-CM | POA: Diagnosis not present

## 2014-05-23 DIAGNOSIS — Z96611 Presence of right artificial shoulder joint: Secondary | ICD-10-CM | POA: Diagnosis not present

## 2014-05-23 DIAGNOSIS — M25511 Pain in right shoulder: Secondary | ICD-10-CM | POA: Diagnosis not present

## 2014-05-23 DIAGNOSIS — M6281 Muscle weakness (generalized): Secondary | ICD-10-CM | POA: Diagnosis not present

## 2014-05-23 DIAGNOSIS — M19011 Primary osteoarthritis, right shoulder: Secondary | ICD-10-CM | POA: Diagnosis not present

## 2014-05-25 DIAGNOSIS — M25511 Pain in right shoulder: Secondary | ICD-10-CM | POA: Diagnosis not present

## 2014-05-25 DIAGNOSIS — M19011 Primary osteoarthritis, right shoulder: Secondary | ICD-10-CM | POA: Diagnosis not present

## 2014-05-25 DIAGNOSIS — Z96611 Presence of right artificial shoulder joint: Secondary | ICD-10-CM | POA: Diagnosis not present

## 2014-05-25 DIAGNOSIS — M6281 Muscle weakness (generalized): Secondary | ICD-10-CM | POA: Diagnosis not present

## 2014-05-30 DIAGNOSIS — M19011 Primary osteoarthritis, right shoulder: Secondary | ICD-10-CM | POA: Diagnosis not present

## 2014-05-30 DIAGNOSIS — Z96611 Presence of right artificial shoulder joint: Secondary | ICD-10-CM | POA: Diagnosis not present

## 2014-05-30 DIAGNOSIS — M25511 Pain in right shoulder: Secondary | ICD-10-CM | POA: Diagnosis not present

## 2014-05-30 DIAGNOSIS — M6281 Muscle weakness (generalized): Secondary | ICD-10-CM | POA: Diagnosis not present

## 2014-06-01 DIAGNOSIS — M19011 Primary osteoarthritis, right shoulder: Secondary | ICD-10-CM | POA: Diagnosis not present

## 2014-06-01 DIAGNOSIS — Z96611 Presence of right artificial shoulder joint: Secondary | ICD-10-CM | POA: Diagnosis not present

## 2014-06-01 DIAGNOSIS — M25511 Pain in right shoulder: Secondary | ICD-10-CM | POA: Diagnosis not present

## 2014-06-01 DIAGNOSIS — M6281 Muscle weakness (generalized): Secondary | ICD-10-CM | POA: Diagnosis not present

## 2014-06-04 DIAGNOSIS — Z96611 Presence of right artificial shoulder joint: Secondary | ICD-10-CM | POA: Diagnosis not present

## 2014-06-04 DIAGNOSIS — M19011 Primary osteoarthritis, right shoulder: Secondary | ICD-10-CM | POA: Diagnosis not present

## 2014-06-04 DIAGNOSIS — M6281 Muscle weakness (generalized): Secondary | ICD-10-CM | POA: Diagnosis not present

## 2014-06-04 DIAGNOSIS — M25511 Pain in right shoulder: Secondary | ICD-10-CM | POA: Diagnosis not present

## 2014-06-06 DIAGNOSIS — M19011 Primary osteoarthritis, right shoulder: Secondary | ICD-10-CM | POA: Diagnosis not present

## 2014-06-06 DIAGNOSIS — Z96611 Presence of right artificial shoulder joint: Secondary | ICD-10-CM | POA: Diagnosis not present

## 2014-06-06 DIAGNOSIS — M6281 Muscle weakness (generalized): Secondary | ICD-10-CM | POA: Diagnosis not present

## 2014-06-06 DIAGNOSIS — M25511 Pain in right shoulder: Secondary | ICD-10-CM | POA: Diagnosis not present

## 2014-06-12 DIAGNOSIS — M6281 Muscle weakness (generalized): Secondary | ICD-10-CM | POA: Diagnosis not present

## 2014-06-12 DIAGNOSIS — M25511 Pain in right shoulder: Secondary | ICD-10-CM | POA: Diagnosis not present

## 2014-06-12 DIAGNOSIS — M19011 Primary osteoarthritis, right shoulder: Secondary | ICD-10-CM | POA: Diagnosis not present

## 2014-06-12 DIAGNOSIS — Z96611 Presence of right artificial shoulder joint: Secondary | ICD-10-CM | POA: Diagnosis not present

## 2014-06-14 DIAGNOSIS — M19011 Primary osteoarthritis, right shoulder: Secondary | ICD-10-CM | POA: Diagnosis not present

## 2014-06-14 DIAGNOSIS — Z96611 Presence of right artificial shoulder joint: Secondary | ICD-10-CM | POA: Diagnosis not present

## 2014-06-14 DIAGNOSIS — M6281 Muscle weakness (generalized): Secondary | ICD-10-CM | POA: Diagnosis not present

## 2014-06-14 DIAGNOSIS — M25511 Pain in right shoulder: Secondary | ICD-10-CM | POA: Diagnosis not present

## 2014-06-20 DIAGNOSIS — M19011 Primary osteoarthritis, right shoulder: Secondary | ICD-10-CM | POA: Diagnosis not present

## 2014-06-20 DIAGNOSIS — M6281 Muscle weakness (generalized): Secondary | ICD-10-CM | POA: Diagnosis not present

## 2014-06-20 DIAGNOSIS — M25511 Pain in right shoulder: Secondary | ICD-10-CM | POA: Diagnosis not present

## 2014-06-20 DIAGNOSIS — Z96611 Presence of right artificial shoulder joint: Secondary | ICD-10-CM | POA: Diagnosis not present

## 2014-06-22 DIAGNOSIS — Z96611 Presence of right artificial shoulder joint: Secondary | ICD-10-CM | POA: Diagnosis not present

## 2014-06-22 DIAGNOSIS — M19011 Primary osteoarthritis, right shoulder: Secondary | ICD-10-CM | POA: Diagnosis not present

## 2014-06-22 DIAGNOSIS — M6281 Muscle weakness (generalized): Secondary | ICD-10-CM | POA: Diagnosis not present

## 2014-06-22 DIAGNOSIS — M25511 Pain in right shoulder: Secondary | ICD-10-CM | POA: Diagnosis not present

## 2014-06-25 DIAGNOSIS — M6281 Muscle weakness (generalized): Secondary | ICD-10-CM | POA: Diagnosis not present

## 2014-06-25 DIAGNOSIS — M25511 Pain in right shoulder: Secondary | ICD-10-CM | POA: Diagnosis not present

## 2014-06-25 DIAGNOSIS — M19011 Primary osteoarthritis, right shoulder: Secondary | ICD-10-CM | POA: Diagnosis not present

## 2014-06-25 DIAGNOSIS — Z96611 Presence of right artificial shoulder joint: Secondary | ICD-10-CM | POA: Diagnosis not present

## 2014-06-29 DIAGNOSIS — M6281 Muscle weakness (generalized): Secondary | ICD-10-CM | POA: Diagnosis not present

## 2014-06-29 DIAGNOSIS — M25511 Pain in right shoulder: Secondary | ICD-10-CM | POA: Diagnosis not present

## 2014-06-29 DIAGNOSIS — M19011 Primary osteoarthritis, right shoulder: Secondary | ICD-10-CM | POA: Diagnosis not present

## 2014-06-29 DIAGNOSIS — Z96611 Presence of right artificial shoulder joint: Secondary | ICD-10-CM | POA: Diagnosis not present

## 2014-07-02 DIAGNOSIS — M19011 Primary osteoarthritis, right shoulder: Secondary | ICD-10-CM | POA: Diagnosis not present

## 2014-07-02 DIAGNOSIS — M6281 Muscle weakness (generalized): Secondary | ICD-10-CM | POA: Diagnosis not present

## 2014-07-02 DIAGNOSIS — Z96611 Presence of right artificial shoulder joint: Secondary | ICD-10-CM | POA: Diagnosis not present

## 2014-07-02 DIAGNOSIS — M25511 Pain in right shoulder: Secondary | ICD-10-CM | POA: Diagnosis not present

## 2014-07-03 DIAGNOSIS — E119 Type 2 diabetes mellitus without complications: Secondary | ICD-10-CM | POA: Diagnosis not present

## 2014-07-03 DIAGNOSIS — L989 Disorder of the skin and subcutaneous tissue, unspecified: Secondary | ICD-10-CM | POA: Diagnosis not present

## 2014-07-07 ENCOUNTER — Emergency Department (HOSPITAL_BASED_OUTPATIENT_CLINIC_OR_DEPARTMENT_OTHER): Payer: Medicare Other

## 2014-07-07 ENCOUNTER — Observation Stay (HOSPITAL_BASED_OUTPATIENT_CLINIC_OR_DEPARTMENT_OTHER)
Admission: EM | Admit: 2014-07-07 | Discharge: 2014-07-08 | Disposition: A | Discharge status: Home / Self Care | Payer: Medicare Other | Attending: Neurosurgery | Admitting: Neurosurgery

## 2014-07-07 ENCOUNTER — Encounter (HOSPITAL_BASED_OUTPATIENT_CLINIC_OR_DEPARTMENT_OTHER): Payer: Self-pay

## 2014-07-07 DIAGNOSIS — E119 Type 2 diabetes mellitus without complications: Secondary | ICD-10-CM | POA: Diagnosis not present

## 2014-07-07 DIAGNOSIS — M1990 Unspecified osteoarthritis, unspecified site: Secondary | ICD-10-CM | POA: Diagnosis not present

## 2014-07-07 DIAGNOSIS — I10 Essential (primary) hypertension: Secondary | ICD-10-CM | POA: Insufficient documentation

## 2014-07-07 DIAGNOSIS — S065X9A Traumatic subdural hemorrhage with loss of consciousness of unspecified duration, initial encounter: Secondary | ICD-10-CM

## 2014-07-07 DIAGNOSIS — M25511 Pain in right shoulder: Secondary | ICD-10-CM | POA: Diagnosis not present

## 2014-07-07 DIAGNOSIS — Z7982 Long term (current) use of aspirin: Secondary | ICD-10-CM | POA: Diagnosis not present

## 2014-07-07 DIAGNOSIS — I6200 Nontraumatic subdural hemorrhage, unspecified: Secondary | ICD-10-CM | POA: Diagnosis present

## 2014-07-07 DIAGNOSIS — W19XXXA Unspecified fall, initial encounter: Secondary | ICD-10-CM

## 2014-07-07 DIAGNOSIS — S0101XA Laceration without foreign body of scalp, initial encounter: Secondary | ICD-10-CM | POA: Insufficient documentation

## 2014-07-07 DIAGNOSIS — W1839XA Other fall on same level, initial encounter: Secondary | ICD-10-CM | POA: Diagnosis not present

## 2014-07-07 DIAGNOSIS — Y92019 Unspecified place in single-family (private) house as the place of occurrence of the external cause: Secondary | ICD-10-CM | POA: Insufficient documentation

## 2014-07-07 DIAGNOSIS — Z79899 Other long term (current) drug therapy: Secondary | ICD-10-CM | POA: Diagnosis not present

## 2014-07-07 DIAGNOSIS — K589 Irritable bowel syndrome without diarrhea: Secondary | ICD-10-CM | POA: Insufficient documentation

## 2014-07-07 DIAGNOSIS — K754 Autoimmune hepatitis: Secondary | ICD-10-CM | POA: Insufficient documentation

## 2014-07-07 DIAGNOSIS — S0190XA Unspecified open wound of unspecified part of head, initial encounter: Secondary | ICD-10-CM | POA: Diagnosis not present

## 2014-07-07 DIAGNOSIS — K219 Gastro-esophageal reflux disease without esophagitis: Secondary | ICD-10-CM | POA: Diagnosis not present

## 2014-07-07 DIAGNOSIS — F419 Anxiety disorder, unspecified: Secondary | ICD-10-CM | POA: Insufficient documentation

## 2014-07-07 DIAGNOSIS — S065X0A Traumatic subdural hemorrhage without loss of consciousness, initial encounter: Principal | ICD-10-CM | POA: Insufficient documentation

## 2014-07-07 DIAGNOSIS — S0990XA Unspecified injury of head, initial encounter: Secondary | ICD-10-CM | POA: Diagnosis not present

## 2014-07-07 DIAGNOSIS — S065XAA Traumatic subdural hemorrhage with loss of consciousness status unknown, initial encounter: Secondary | ICD-10-CM | POA: Diagnosis present

## 2014-07-07 DIAGNOSIS — E039 Hypothyroidism, unspecified: Secondary | ICD-10-CM | POA: Diagnosis not present

## 2014-07-07 DIAGNOSIS — M533 Sacrococcygeal disorders, not elsewhere classified: Secondary | ICD-10-CM | POA: Diagnosis not present

## 2014-07-07 DIAGNOSIS — S199XXA Unspecified injury of neck, initial encounter: Secondary | ICD-10-CM | POA: Diagnosis not present

## 2014-07-07 DIAGNOSIS — E780 Pure hypercholesterolemia: Secondary | ICD-10-CM | POA: Insufficient documentation

## 2014-07-07 LAB — CBC WITH DIFFERENTIAL/PLATELET
Basophils Absolute: 0 10*3/uL (ref 0.0–0.1)
Basophils Relative: 0 % (ref 0–1)
Eosinophils Absolute: 0.1 10*3/uL (ref 0.0–0.7)
Eosinophils Relative: 3 % (ref 0–5)
HCT: 38.3 % (ref 36.0–46.0)
Hemoglobin: 12.5 g/dL (ref 12.0–15.0)
Lymphocytes Relative: 23 % (ref 12–46)
Lymphs Abs: 1 10*3/uL (ref 0.7–4.0)
MCH: 29.3 pg (ref 26.0–34.0)
MCHC: 32.6 g/dL (ref 30.0–36.0)
MCV: 89.7 fL (ref 78.0–100.0)
Monocytes Absolute: 0.4 10*3/uL (ref 0.1–1.0)
Monocytes Relative: 11 % (ref 3–12)
Neutro Abs: 2.6 10*3/uL (ref 1.7–7.7)
Neutrophils Relative %: 63 % (ref 43–77)
Platelets: 134 10*3/uL — ABNORMAL LOW (ref 150–400)
RBC: 4.27 MIL/uL (ref 3.87–5.11)
RDW: 14.1 % (ref 11.5–15.5)
WBC: 4.1 10*3/uL (ref 4.0–10.5)

## 2014-07-07 LAB — GLUCOSE, CAPILLARY
Glucose-Capillary: 114 mg/dL — ABNORMAL HIGH (ref 70–99)
Glucose-Capillary: 140 mg/dL — ABNORMAL HIGH (ref 70–99)

## 2014-07-07 LAB — PROTIME-INR
INR: 1 (ref 0.00–1.49)
Prothrombin Time: 13.2 seconds (ref 11.6–15.2)

## 2014-07-07 LAB — CBG MONITORING, ED: Glucose-Capillary: 122 mg/dL — ABNORMAL HIGH (ref 70–99)

## 2014-07-07 LAB — BASIC METABOLIC PANEL
Anion gap: 13 (ref 5–15)
BUN: 14 mg/dL (ref 6–23)
CO2: 27 mEq/L (ref 19–32)
Calcium: 10.4 mg/dL (ref 8.4–10.5)
Chloride: 100 mEq/L (ref 96–112)
Creatinine, Ser: 0.5 mg/dL (ref 0.50–1.10)
GFR calc Af Amer: >90 mL/min (ref >90)
GFR calc non Af Amer: 89 mL/min — ABNORMAL LOW (ref >90)
Glucose, Bld: 140 mg/dL — ABNORMAL HIGH (ref 70–99)
Potassium: 4.3 mEq/L (ref 3.7–5.3)
Sodium: 140 mEq/L (ref 137–147)

## 2014-07-07 LAB — MRSA PCR SCREENING: MRSA by PCR: NEGATIVE

## 2014-07-07 MED ORDER — TETANUS-DIPHTH-ACELL PERTUSSIS 5-2.5-18.5 LF-MCG/0.5 IM SUSP
0.5000 mL | Freq: Once | INTRAMUSCULAR | Status: AC
  Administered 2014-07-07: 0.5 mL via INTRAMUSCULAR
  Filled 2014-07-07: qty 0.5

## 2014-07-07 MED ORDER — MORPHINE SULFATE 2 MG/ML IJ SOLN
2.0000 mg | INTRAMUSCULAR | Status: DC | PRN
  Administered 2014-07-07: 2 mg via INTRAVENOUS
  Filled 2014-07-07: qty 1

## 2014-07-07 MED ORDER — DOCUSATE SODIUM 100 MG PO CAPS
100.0000 mg | ORAL_CAPSULE | Freq: Two times a day (BID) | ORAL | Status: DC
  Administered 2014-07-07: 100 mg via ORAL
  Filled 2014-07-07 (×3): qty 1

## 2014-07-07 MED ORDER — ONDANSETRON HCL 4 MG/2ML IJ SOLN: 4.0000 mg | Freq: Four times a day (QID) | INTRAMUSCULAR | Status: DC | PRN

## 2014-07-07 MED ORDER — MORPHINE SULFATE 4 MG/ML IJ SOLN
4.0000 mg | Freq: Once | INTRAMUSCULAR | Status: AC
Start: 2014-07-07 — End: 2014-07-07
  Administered 2014-07-07: 4 mg via INTRAVENOUS
  Filled 2014-07-07: qty 1

## 2014-07-07 MED ORDER — ACETAMINOPHEN 325 MG PO TABS: 650.0000 mg | ORAL_TABLET | Freq: Four times a day (QID) | ORAL | Status: DC | PRN

## 2014-07-07 MED ORDER — POTASSIUM CHLORIDE IN NACL 20-0.9 MEQ/L-% IV SOLN
INTRAVENOUS | Status: DC
  Administered 2014-07-07: 16:00:00 via INTRAVENOUS
  Filled 2014-07-07 (×3): qty 1000

## 2014-07-07 MED ORDER — ONDANSETRON HCL 4 MG/2ML IJ SOLN
4.0000 mg | Freq: Once | INTRAMUSCULAR | Status: AC
  Administered 2014-07-07: 4 mg via INTRAVENOUS
  Filled 2014-07-07: qty 2

## 2014-07-07 MED ORDER — SODIUM CHLORIDE 0.9 % IJ SOLN: 3.0000 mL | Freq: Two times a day (BID) | INTRAMUSCULAR | Status: DC

## 2014-07-07 MED ORDER — ONDANSETRON HCL 4 MG PO TABS: 4.0000 mg | ORAL_TABLET | Freq: Four times a day (QID) | ORAL | Status: DC | PRN

## 2014-07-07 MED ORDER — LIDOCAINE-EPINEPHRINE (PF) 2 %-1:200000 IJ SOLN
INTRAMUSCULAR | Status: AC
  Administered 2014-07-07: 10 mL
  Filled 2014-07-07: qty 20

## 2014-07-07 MED ORDER — ACETAMINOPHEN 650 MG RE SUPP: 650.0000 mg | Freq: Four times a day (QID) | RECTAL | Status: DC | PRN

## 2014-07-07 MED ORDER — HYDROCODONE-ACETAMINOPHEN 5-325 MG PO TABS
1.0000 | ORAL_TABLET | ORAL | Status: DC | PRN
  Administered 2014-07-07: 2 via ORAL
  Administered 2014-07-08: 1 via ORAL
  Administered 2014-07-08: 2 via ORAL
  Filled 2014-07-07: qty 1
  Filled 2014-07-07 (×2): qty 2

## 2014-07-07 MED ORDER — ALUM & MAG HYDROXIDE-SIMETH 200-200-20 MG/5ML PO SUSP: 30.0000 mL | Freq: Four times a day (QID) | ORAL | Status: DC | PRN

## 2014-07-07 NOTE — ED Notes (Signed)
MD at bedside. 

## 2014-07-07 NOTE — ED Notes (Signed)
Update called to Jorene Guest, RN [phone] prior to pt transport- Okay for pt to go directly to unit but room assignment has been changed and will be given upon arrival. Pt transport at this time by [facility]

## 2014-07-07 NOTE — ED Provider Notes (Signed)
CSN: [id number]     Arrival date & time 07/07/14  1016 History   First MD Initiated Contact with Patient 07/07/14 1049     Chief Complaint  Patient presents with  . Fall     (Consider location/radiation/quality/duration/timing/severity/associated sxs/prior Treatment) Patient is a 80 y.o. female presenting with fall. The history is provided by the patient.  Fall This is a new (was cleaning her fireplace and the mantle came off causing her to fall backwards off the fireplace and hit her head on the corner of a table) problem. The current episode started 1 to 2 hours ago. The problem occurs constantly. The problem has not changed since onset.Associated symptoms include headaches. Pertinent negatives include no chest pain and no abdominal pain. Associated symptoms comments: Tailbone pain.  Was able to walk without difficulty. Nothing aggravates the symptoms. Nothing relieves the symptoms. She has tried nothing for the symptoms. The treatment provided no relief.    Past Medical History  Diagnosis Date  . Hypertension   . Diabetes mellitus without complication     Type 2   . Osteoarthritis   . S/P dilatation of esophageal stricture   . Unilateral vocal cord paralysis   . Mucus pooling in larynx     Begin after having thyroidectomy; pt takes Mucus medication nightly  . Shortness of breath     with exertion  . Anxiety   . Hypothyroidism   . GERD (gastroesophageal reflux disease)   . Hepatitis     Autoimmune   . IBS (irritable bowel syndrome)   . Hypercholesteremia   . Vitiligo   . Heart murmur     mild AS by 01/2010 echo [facility])   Past Surgical History  Procedure Laterality Date  . Thyroidectomy  11-2008  . Joint replacement Left     Knee  . Joint replacement Left     Shoulder  . Vocal laryngoplasty      s/p left vocal fold medialization laryngoplasty with Goretex 02/07/10 (Dr. Wonda Amis)  . Elbow bursa surgery Left   . Abdominal hysterectomy    . Appendectomy    . Total  shoulder arthroplasty Right 05/16/2014    Procedure: TOTAL SHOULDER ARTHROPLASTY;  Surgeon: Ninetta Lights, MD;  Location: [place];  Service: Orthopedics;  Laterality: Right;   Family History  Problem Relation Age of Onset  . Stomach cancer Mother   . Stroke Brother   . Prostate cancer Brother   . Stroke Brother   . Multiple sclerosis Sister    History  Substance Use Topics  . Smoking status: Never Smoker   . Smokeless tobacco: Not on file  . Alcohol Use: No   OB History    No data available     Review of Systems  Cardiovascular: Negative for chest pain.  Gastrointestinal: Negative for abdominal pain.  Neurological: Positive for headaches. Negative for syncope.  All other systems reviewed and are negative.     Allergies  Review of patient's allergies indicates no known allergies.  Home Medications   Prior to Admission medications   Medication Sig Start Date End Date Taking? Authorizing Provider  ALPRAZolam Duanne Moron) 0.5 MG tablet Take 0.5 mg by mouth at bedtime as needed for anxiety.    Historical Provider, MD  amLODipine (NORVASC) 10 MG tablet Take 10 mg by mouth daily.    Historical Provider, MD  aspirin 81 MG tablet Take 81 mg by mouth daily.    Historical Provider, MD  azelastine (ASTELIN) 137 MCG/SPRAY nasal spray  Place 2 sprays into both nostrils daily. Use in each nostril as directed    Historical Provider, MD  bisacodyl (DULCOLAX) 5 MG EC tablet Take 1 tablet (5 mg total) by mouth daily as needed for moderate constipation. 05/16/14   M. Doran Stabler, PA-C  Canagliflozin (INVOKANA) 100 MG TABS Take 100 mg by mouth daily.    Historical Provider, MD  ciclopirox (LOPROX) 0.77 % cream Apply 1 application topically 2 (two) times daily.    Historical Provider, MD  doxazosin (CARDURA) 2 MG tablet Take 2 mg by mouth 2 (two) times daily.    Historical Provider, MD  esomeprazole (NEXIUM) 40 MG capsule Take 40 mg by mouth daily at 12 noon.    Historical Provider, MD  furosemide  (LASIX) 40 MG tablet Take 20 mg by mouth daily.     Historical Provider, MD  HYDROcodone-acetaminophen (NORCO) 7.5-325 MG per tablet Take 1-2 tablets by mouth every 4 (four) hours as needed for moderate pain. 05/16/14   M. Doran Stabler, PA-C  hyoscyamine (LEVBID) 0.375 MG 12 hr tablet Take 0.375 mg by mouth every 12 (twelve) hours as needed (IBS symptoms).    Historical Provider, MD  irbesartan (AVAPRO) 150 MG tablet Take 150 mg by mouth 2 (two) times daily.    Historical Provider, MD  levothyroxine (SYNTHROID, LEVOTHROID) 125 MCG tablet Take 125 mcg by mouth daily before breakfast.    Historical Provider, MD  metFORMIN (GLUCOPHAGE) 1000 MG tablet Take 1,000 mg by mouth 2 (two) times daily with a meal.    Historical Provider, MD  metoprolol succinate (TOPROL-XL) 50 MG 24 hr tablet Take 50 mg by mouth 2 (two) times daily. Take with or immediately following a meal.    Historical Provider, MD  Multiple Vitamins-Minerals (ANTIOXIDANT PO) Take 1 tablet by mouth daily.     Historical Provider, MD  ondansetron (ZOFRAN) 4 MG tablet Take 1 tablet (4 mg total) by mouth every 8 (eight) hours as needed for nausea or vomiting. 05/16/14   M. Doran Stabler, PA-C  rosuvastatin (CRESTOR) 20 MG tablet Take 20 mg by mouth daily.    Historical Provider, MD  sitaGLIPtin (JANUVIA) 100 MG tablet Take 100 mg by mouth daily.    Historical Provider, MD   BP 171/70 mmHg  Pulse 68  Temp(Src) 98.2 F (36.8 C) (Oral)  Resp 18  SpO2 99% Physical Exam  Constitutional: She is oriented to person, place, and time. She appears well-developed and well-nourished. No distress.  HENT:  Head: Normocephalic. Head is with contusion and with laceration.    Mouth/Throat: Oropharynx is clear and moist.  Eyes: Conjunctivae and EOM are normal. Pupils are equal, round, and reactive to light.  Neck: Normal range of motion. Neck supple. No spinous process tenderness and no muscular tenderness present.  Cardiovascular: Normal rate, regular  rhythm and intact distal pulses.   No murmur heard. Pulmonary/Chest: Effort normal and breath sounds normal. No respiratory distress. She has no wheezes. She has no rales.  Abdominal: Soft. She exhibits no distension. There is no tenderness. There is no rebound and no guarding.  Musculoskeletal: Normal range of motion. She exhibits no edema.       Lumbar back: She exhibits tenderness and bony tenderness.       Back:       Legs:      Feet:  Tenderness with palpation of coccyx  Neurological: She is alert and oriented to person, place, and time.  Skin: Skin is warm and dry. No rash  noted. No erythema.  Psychiatric: She has a normal mood and affect. Her behavior is normal.  Nursing note and vitals reviewed.   ED Course  Procedures (including critical care time) Labs Review Labs Reviewed - No data to display  Imaging Review Dg Sacrum/coccyx  07/07/2014   CLINICAL DATA:  Fall with sacral and coccyx injury. Initial encounter.  EXAM: SACRUM AND COCCYX - 2+ VIEW  COMPARISON:  None.  FINDINGS: No acute fracture identified. The sacroiliac joints show mild degenerative changes bilaterally. The coccyx shows normal alignment. Moderate spondylosis noted in the lower lumbar spine. No bony lesions identified.  IMPRESSION: No evidence of acute fracture involving the sacrum or coccyx.   Electronically Signed   By: Aletta Edouard M.D.   On: 07/07/2014 11:30   Ct Head Wo Contrast  07/07/2014   CLINICAL DATA:  Fall.  Headache.  Posterior swelling.  EXAM: CT HEAD WITHOUT CONTRAST  CT CERVICAL SPINE WITHOUT CONTRAST  TECHNIQUE: Multidetector CT imaging of the head and cervical spine was performed following the standard protocol without intravenous contrast. Multiplanar CT image reconstructions of the cervical spine were also generated.  COMPARISON:  07/11/2007  FINDINGS: CT HEAD FINDINGS  There is a 14 mm thick dense collection along the anterior falx compatible with subdural hematoma. Mild mass effect on the  adjacent right frontal lobe. No significant midline shift. No hydrocephalus. No intra-axial blood. No acute infarction.  Posterior scalp laceration and swelling. No underlying acute calvarial abnormality.  CT CERVICAL SPINE FINDINGS  Degenerative disc disease and facet disease throughout the cervical spine. Fusion across the facet joints at C2-3. No fracture or subluxation. Prevertebral soft tissues are normal. No epidural or paraspinal hematoma.  IMPRESSION: 14 mm thick subdural hematoma along the anterior falx with mild mass effect on the adjacent right frontal lobe. No midline shift. No intra-axial hemorrhage. No hydrocephalus.  No acute bony abnormality in the cervical spine.  Critical Value/emergent results were called by telephone at the time of interpretation on 07/07/2014 at 11:45 am to Dr. Blanchie Dessert , who verbally acknowledged these results.   Electronically Signed   By: Rolm Baptise M.D.   On: 07/07/2014 11:45     EKG Interpretation None      LACERATION REPAIR Performed by: Blanchie Dessert Authorized by: Blanchie Dessert Consent: Verbal consent obtained. Risks and benefits: risks, benefits and alternatives were discussed Consent given by: patient Patient identity confirmed: provided demographic data Prepped and Draped in normal sterile fashion Wound explored  Laceration Location: scalp  Laceration Length: 3cm  No Foreign Bodies seen or palpated  Anesthesia: local infiltration  Local anesthetic: lidocaine 2% with epinephrine  Anesthetic total: 3 ml  Irrigation method: syringe Amount of cleaning: standard  Skin closure: staples  Number of sutures: 5  Technique: staples  Patient tolerance: Patient tolerated the procedure well with no immediate complications.   MDM   Final diagnoses:  Fall  Subdural hematoma    Patient with a mechanical fall today backwards when her fireplace frame came on. She hit her head on the corner of a table and is now complaining  of headache and tailbone pain. She denies LOC and only takes aspirin.  Unknown last tetanus. Patient was able to ambulate without difficulty.  CT of the head and neck pending. Wound repaired as above. Tetanus updated. Also sacral films pending. She has no chest or abdominal tenderness and no lumbar or thoracic tenderness  11:52 AM Head CT positive for subdural hematoma.  Pt only takes ASA.  Will  discuss with NSU.  Pt currently normal mental status without deficit.  Blanchie Dessert, MD 07/07/14 1226

## 2014-07-07 NOTE — Consult Note (Signed)
Subjective: The patient is an 80 year old white female who was in her usual state of good health until this morning when she was closing the glass doors of her fireplace and the mantel fell on her knocking her backwards. She struck her head but did not lose consciousness. She was taken to the [facility] and evaluated by the emergency room doctor with a head CT. This demonstrated a right interhemispheric subdural hematoma. The patient was transferred to [facility] for neurosurgical observation.  Presently the patient is accompanied by her husband and daughter. She is alert and pleasant. She has no complaints. She has minimal neck pain. The patient is taking aspirin but no other anticoagulants.  Past Medical History  Diagnosis Date  . Hypertension   . Diabetes mellitus without complication     Type 2   . Osteoarthritis   . S/P dilatation of esophageal stricture   . Unilateral vocal cord paralysis   . Mucus pooling in larynx     Begin after having thyroidectomy; pt takes Mucus medication nightly  . Shortness of breath     with exertion  . Anxiety   . Hypothyroidism   . GERD (gastroesophageal reflux disease)   . IBS (irritable bowel syndrome)   . Hypercholesteremia   . Vitiligo   . Heart murmur     mild AS by 01/2010 echo [facility])  . Hepatitis     Autoimmune     Past Surgical History  Procedure Laterality Date  . Thyroidectomy  11-2008  . Joint replacement Left     Knee  . Joint replacement Left     Shoulder  . Vocal laryngoplasty      s/p left vocal fold medialization laryngoplasty with Goretex 02/07/10 (Dr. Wonda Amis)  . Elbow bursa surgery Left   . Abdominal hysterectomy    . Appendectomy    . Total shoulder arthroplasty Right 05/16/2014    Procedure: TOTAL SHOULDER ARTHROPLASTY;  Surgeon: Ninetta Lights, MD;  Location: [place];  Service: Orthopedics;  Laterality: Right;    No Known Allergies  History  Substance Use Topics  . Smoking status:  Never Smoker   . Smokeless tobacco: Never Used  . Alcohol Use: No    Family History  Problem Relation Age of Onset  . Stomach cancer Mother   . Stroke Brother   . Prostate cancer Brother   . Stroke Brother   . Multiple sclerosis Sister    Prior to Admission medications   Medication Sig Start Date End Date Taking? Authorizing Provider  ALPRAZolam Duanne Moron) 0.5 MG tablet Take 0.5 mg by mouth at bedtime as needed for anxiety.   Yes Historical Provider, MD  amLODipine (NORVASC) 10 MG tablet Take 10 mg by mouth daily.   Yes Historical Provider, MD  aspirin 81 MG tablet Take 81 mg by mouth daily.   Yes Historical Provider, MD  azelastine (ASTELIN) 137 MCG/SPRAY nasal spray Place 2 sprays into both nostrils daily. Use in each nostril as directed   Yes Historical Provider, MD  doxazosin (CARDURA) 2 MG tablet Take 2 mg by mouth 2 (two) times daily.   Yes Historical Provider, MD  esomeprazole (NEXIUM) 40 MG capsule Take 40 mg by mouth daily at 12 noon.   Yes Historical Provider, MD  furosemide (LASIX) 40 MG tablet Take 20 mg by mouth daily.    Yes Historical Provider, MD  HYDROcodone-acetaminophen (NORCO) 7.5-325 MG per tablet Take 1-2 tablets by mouth every 4 (four) hours  as needed for moderate pain. 05/16/14  Yes M. Doran Stabler, PA-C  irbesartan (AVAPRO) 150 MG tablet Take 150 mg by mouth 2 (two) times daily.   Yes Historical Provider, MD  levothyroxine (SYNTHROID, LEVOTHROID) 125 MCG tablet Take 125 mcg by mouth daily before breakfast.   Yes Historical Provider, MD  metFORMIN (GLUCOPHAGE) 1000 MG tablet Take 1,000 mg by mouth 2 (two) times daily with a meal.   Yes Historical Provider, MD  metoprolol succinate (TOPROL-XL) 50 MG 24 hr tablet Take 50 mg by mouth 2 (two) times daily. Take with or immediately following a meal.   Yes Historical Provider, MD  Multiple Vitamins-Minerals (ANTIOXIDANT PO) Take 1 tablet by mouth daily.    Yes Historical Provider, MD  rosuvastatin (CRESTOR) 20 MG tablet Take  20 mg by mouth daily.   Yes Historical Provider, MD  sitaGLIPtin (JANUVIA) 100 MG tablet Take 100 mg by mouth daily.   Yes Historical Provider, MD  bisacodyl (DULCOLAX) 5 MG EC tablet Take 1 tablet (5 mg total) by mouth daily as needed for moderate constipation. Patient not taking: Reported on 07/07/2014 05/16/14   M. Doran Stabler, PA-C  hyoscyamine (LEVBID) 0.375 MG 12 hr tablet Take 0.375 mg by mouth every 12 (twelve) hours as needed (IBS symptoms).    Historical Provider, MD  ondansetron (ZOFRAN) 4 MG tablet Take 1 tablet (4 mg total) by mouth every 8 (eight) hours as needed for nausea or vomiting. Patient not taking: Reported on 07/07/2014 05/16/14   M. Doran Stabler, PA-C     Review of Systems  Positive ROS: Negative except as above.  All other systems have been reviewed and were otherwise negative with the exception of those mentioned in the HPI and as above.  Objective: Vital signs in last 24 hours: Temp:  [98 F (36.7 C)-98.4 F (36.9 C)] 98 F (36.7 C) (11/21 1404) Pulse Rate:  [59-68] 66 (11/21 1415) Resp:  [18-23] 23 (11/21 1415) BP: (140-171)/(57-70) 140/66 mmHg (11/21 1415) SpO2:  [95 %-99 %] 99 % (11/21 1415) Weight:  [87 kg (191 lb 12.8 oz)] 87 kg (191 lb 12.8 oz) (11/21 1404)  General: An alert and pleasant 80 year old white female in no apparent distress  HEENT: Normocephalic. The patient has a approximately 3 cm laceration in her right parietal region which has been stapled. Her pupils are equal round reactive light, extraocular muscles intact  Neck: Supple without masses or deformities. She has an age-appropriate limited cervical range of motion. Spurling's testing was negative.  Thorax: Symmetric  Abdomen: Soft  Extremities: No obvious deformities  Back exam: Unremarkable  Neurologic exam: The patient is alert and oriented 3, Glasgow Coma Scale 15. Cranial nerves II through XII were examined bilaterally and grossly normal. Vision and hearing are grossly  normal bulk bilaterally motor strength is 5 over 5 in her bilateral bicep, tricep, hand grip, quadriceps, gastrocnemius, dorsiflexors the patient's sensory function is intact to light touch and sensation all tested dermatomes bilaterally. Cerebellar function is intact to bilateral rapid alternate movements.  I have reviewed the patient's head CT performed at [facility] [place] today. It demonstrates a right interhemispheric subdural hematoma with mild mass effect.  I have also reviewed the patient's cervical CT performed at [facility] [place] today. The patient has diffuse degenerative changes. There is some metal artifact from the patient's shoulder prosthesis. I don't see any acute findings. Data Review Lab Results  Component Value Date   WBC 4.1 07/07/2014   HGB 12.5 07/07/2014  HCT 38.3 07/07/2014   MCV 89.7 07/07/2014   PLT 134* 07/07/2014   Lab Results  Component Value Date   NA 140 07/07/2014   K 4.3 07/07/2014   CL 100 07/07/2014   CO2 27 07/07/2014   BUN 14 07/07/2014   CREATININE 0.50 07/07/2014   GLUCOSE 140* 07/07/2014   Lab Results  Component Value Date   INR 1.00 07/07/2014    Assessment/Plan: Right subdural hematoma: The patient is doing well clinically. She will be admitted to the ICU for observation. We will plan to repeat her CAT scan tomorrow morning. If it looks good she can go home tomorrow. I have answered all the patient's, and her family's, questions.   Reygan Heagle D 07/07/2014 3:19 PM

## 2014-07-07 NOTE — ED Notes (Signed)
Suture cart is at the bedside set up and ready for the doctor to use. 

## 2014-07-07 NOTE — ED Notes (Signed)
MD at bedside to repair laceration

## 2014-07-07 NOTE — ED Notes (Addendum)
Patient here after falling backwards this am striking back of head on corner of table, denies loc. Patient complains of soreness all over after attempting to close fireplace door and the door came loose causing her to fall backwards, had shoulder replacement 6-8 weeks ago. Dried blood noted to scalp, wound being cleaned for further assessment. Alert and oriented

## 2014-07-07 NOTE — Plan of Care (Signed)
Problem: Phase I Progression Outcomes Goal: Pain controlled with appropriate interventions Outcome: Progressing     

## 2014-07-08 ENCOUNTER — Observation Stay (HOSPITAL_COMMUNITY): Payer: Medicare Other

## 2014-07-08 DIAGNOSIS — S065X0A Traumatic subdural hemorrhage without loss of consciousness, initial encounter: Secondary | ICD-10-CM | POA: Diagnosis not present

## 2014-07-08 DIAGNOSIS — I6201 Nontraumatic acute subdural hemorrhage: Secondary | ICD-10-CM | POA: Diagnosis not present

## 2014-07-08 LAB — GLUCOSE, CAPILLARY
Glucose-Capillary: 109 mg/dL — ABNORMAL HIGH (ref 70–99)
Glucose-Capillary: 118 mg/dL — ABNORMAL HIGH (ref 70–99)
Glucose-Capillary: 134 mg/dL — ABNORMAL HIGH (ref 70–99)

## 2014-07-08 MED ORDER — HYDROCODONE-ACETAMINOPHEN 5-325 MG PO TABS: 1.0000 | ORAL_TABLET | ORAL | Status: DC | PRN

## 2014-07-08 NOTE — Plan of Care (Signed)
Problem: Phase II Progression Outcomes Goal: Pain controlled Outcome: Completed/Met Date Met:  07/08/14

## 2014-07-08 NOTE — Plan of Care (Signed)
Problem: Phase II Progression Outcomes Goal: Other Phase II Outcomes/Goals Outcome: Completed/Met Date Met:  02/14/2014

## 2014-07-08 NOTE — Progress Notes (Signed)
Utilization Review Completed.   Kadon Andrus, RN, BSN Nurse Case Manager  

## 2014-07-08 NOTE — Plan of Care (Signed)
Problem: Discharge Progression Outcomes Goal: Tolerating diet Outcome: Completed/Met Date Met:  07/08/14     

## 2014-07-08 NOTE — Plan of Care (Signed)
Problem: Discharge Progression Outcomes Goal: Other Discharge Outcomes/Goals Outcome: Completed/Met Date Met:  07/08/14

## 2014-07-08 NOTE — Plan of Care (Signed)
Problem: Discharge Progression Outcomes Goal: Discharge plan in place and appropriate Outcome: Completed/Met Date Met:  07/08/14     

## 2014-07-08 NOTE — Plan of Care (Signed)
Problem: Phase III Progression Outcomes Goal: Tolerating diet Outcome: Completed/Met Date Met:  07/08/14

## 2014-07-08 NOTE — Plan of Care (Signed)
Problem: Discharge Progression Outcomes Goal: Complications resolved/controlled Outcome: Completed/Met Date Met:  07/08/14

## 2014-07-08 NOTE — Plan of Care (Signed)
Problem: Discharge Progression Outcomes Goal: Barriers To Progression Addressed/Resolved Outcome: Completed/Met Date Met:  07/08/14     

## 2014-07-08 NOTE — Plan of Care (Signed)
Problem: Phase I Progression Outcomes Goal: OOB as tolerated unless otherwise ordered Outcome: Completed/Met Date Met:  07/08/14     

## 2014-07-08 NOTE — Plan of Care (Signed)
Problem: Phase III Progression Outcomes Goal: Other Phase III Outcomes/Goals Outcome: Completed/Met Date Met:  07/08/14

## 2014-07-08 NOTE — Plan of Care (Signed)
Problem: Discharge Progression Outcomes Goal: Activity appropriate for discharge plan Outcome: Completed/Met Date Met:  07/08/14

## 2014-07-08 NOTE — Plan of Care (Signed)
Problem: Phase III Progression Outcomes Goal: Pain controlled on oral analgesia Outcome: Completed/Met Date Met:  07/08/14     

## 2014-07-08 NOTE — Plan of Care (Signed)
Problem: Phase III Progression Outcomes Goal: Discharge plan remains appropriate-arrangements made Outcome: Completed/Met Date Met:  07/08/14     

## 2014-07-08 NOTE — Plan of Care (Signed)
Problem: Discharge Progression Outcomes Goal: Hemodynamically stable Outcome: Completed/Met Date Met:  07/08/14

## 2014-07-08 NOTE — Plan of Care (Signed)
Problem: Phase I Progression Outcomes Goal: No signs of CSF leak Outcome: Completed/Met Date Met:  07/08/14

## 2014-07-08 NOTE — Plan of Care (Signed)
Problem: Phase I Progression Outcomes Goal: Voiding-avoid urinary catheter unless indicated Outcome: Completed/Met Date Met:  07/08/14     

## 2014-07-08 NOTE — Plan of Care (Signed)
Problem: Phase III Progression Outcomes Goal: Knowledge tx mild TBI & pain management Outcome: Completed/Met Date Met:  07/08/14     

## 2014-07-08 NOTE — Plan of Care (Signed)
Problem: Phase III Progression Outcomes Goal: No focal neurological deficits Outcome: Completed/Met Date Met:  07/08/14

## 2014-07-08 NOTE — Plan of Care (Signed)
Problem: Phase I Progression Outcomes Goal: Other Phase I Outcomes/Goals Outcome: Completed/Met Date Met:  07/08/14

## 2014-07-08 NOTE — Plan of Care (Signed)
Problem: Phase II Progression Outcomes Goal: Tolerating diet Outcome: Completed/Met Date Met:  07/08/14

## 2014-07-08 NOTE — Plan of Care (Signed)
Problem: Phase III Progression Outcomes Goal: Activity at appropriate level-compared to baseline (UP IN CHAIR FOR HEMODIALYSIS)  Outcome: Completed/Met Date Met:  07/08/14     

## 2014-07-08 NOTE — Plan of Care (Signed)
Problem: Phase II Progression Outcomes Goal: No focal neurological deficits Outcome: Completed/Met Date Met:  07/08/14

## 2014-07-08 NOTE — Plan of Care (Signed)
Problem: Phase I Progression Outcomes Goal: Hemodynamically stable Outcome: Completed/Met Date Met:  07/08/14     

## 2014-07-08 NOTE — Plan of Care (Signed)
Problem: Phase I Progression Outcomes Goal: Initial discharge plan identified Outcome: Completed/Met Date Met:  07/08/14     

## 2014-07-08 NOTE — Discharge Summary (Signed)
Physician Discharge Summary  Patient ID: Hailey Cobb MRN: [id number] DOB/AGE: 08-28-1931 80 y.o.  Admit date: 07/07/2014 Discharge date: 07/08/2014  Admission Diagnoses: Traumatic right subdural hematoma  Discharge Diagnoses: The same Active Problems:   Subdural hematoma   Discharged Condition: good  Hospital Course: I admitted the patient to [facility] on 07/07/2014 with a diagnosis of a right subdural hematoma. The patient was observed in the ICU. On 07/08/2014 a repeat CAT scan demonstrated subdural hematoma had not changed.  The patient requested discharge to home. The patient was given oral and written discharge instructions. All her questions were answered. The patient takes hydrocodone chronically for her right shoulder pain and requested a refill.  Consults: None Significant Diagnostic Studies: Head CT Treatments: Observation Discharge Exam: Blood pressure 135/53, pulse 59, temperature 97.9 F (36.6 C), temperature source Oral, resp. rate 18, height 5\' 4"  (1.626 m), weight 87 kg (191 lb 12.8 oz), SpO2 92 %. The patient is alert and oriented 3, Glasgow Coma Scale 15. She is moving all 4 extremities well.  Disposition: Home  Discharge Instructions    Call MD for:  difficulty breathing, headache or visual disturbances    Complete by:  As directed      Call MD for:  extreme fatigue    Complete by:  As directed      Call MD for:  hives    Complete by:  As directed      Call MD for:  persistant dizziness or light-headedness    Complete by:  As directed      Call MD for:  persistant nausea and vomiting    Complete by:  As directed      Call MD for:  redness, tenderness, or signs of infection (pain, swelling, redness, odor or green/yellow discharge around incision site)    Complete by:  As directed      Call MD for:  severe uncontrolled pain    Complete by:  As directed      Call MD for:  temperature >100.4    Complete by:  As directed      Diet - low  sodium heart healthy    Complete by:  As directed      Discharge instructions    Complete by:  As directed   Call [phone] for a followup appointment. Take a stool softener while you are using pain medications.     Driving Restrictions    Complete by:  As directed   Do not drive for 2 weeks.     Increase activity slowly    Complete by:  As directed      Lifting restrictions    Complete by:  As directed   Do not lift more than 5 pounds. No excessive bending or twisting.     May shower / Bathe    Complete by:  As directed   He may shower after the pain she is removed 3 days after surgery. Leave the incision alone.     No dressing needed    Complete by:  As directed      No wound care    Complete by:  As directed             Medication List    STOP taking these medications        aspirin 81 MG tablet     HYDROcodone-acetaminophen 7.5-325 MG per tablet  Commonly known as:  NORCO  Replaced by:  HYDROcodone-acetaminophen 5-325 MG per tablet  TAKE these medications        ALPRAZolam 0.5 MG tablet  Commonly known as:  XANAX  Take 0.5 mg by mouth at bedtime as needed for anxiety.     amLODipine 10 MG tablet  Commonly known as:  NORVASC  Take 10 mg by mouth daily.     ANTIOXIDANT PO  Take 1 tablet by mouth daily.     azelastine 0.1 % nasal spray  Commonly known as:  ASTELIN  Place 2 sprays into both nostrils daily. Use in each nostril as directed     bisacodyl 5 MG EC tablet  Commonly known as:  DULCOLAX  Take 1 tablet (5 mg total) by mouth daily as needed for moderate constipation.     doxazosin 2 MG tablet  Commonly known as:  CARDURA  Take 2 mg by mouth 2 (two) times daily.     esomeprazole 40 MG capsule  Commonly known as:  NEXIUM  Take 40 mg by mouth daily at 12 noon.     furosemide 40 MG tablet  Commonly known as:  LASIX  Take 20 mg by mouth daily.     HYDROcodone-acetaminophen 5-325 MG per tablet  Commonly known as:  NORCO/VICODIN  Take 1  tablet by mouth every 4 (four) hours as needed for moderate pain.     hyoscyamine 0.375 MG 12 hr tablet  Commonly known as:  LEVBID  Take 0.375 mg by mouth every 12 (twelve) hours as needed (IBS symptoms).     irbesartan 150 MG tablet  Commonly known as:  AVAPRO  Take 150 mg by mouth 2 (two) times daily.     levothyroxine 125 MCG tablet  Commonly known as:  SYNTHROID, LEVOTHROID  Take 125 mcg by mouth daily before breakfast.     metFORMIN 1000 MG tablet  Commonly known as:  GLUCOPHAGE  Take 1,000 mg by mouth 2 (two) times daily with a meal.     metoprolol succinate 50 MG 24 hr tablet  Commonly known as:  TOPROL-XL  Take 50 mg by mouth 2 (two) times daily. Take with or immediately following a meal.     ondansetron 4 MG tablet  Commonly known as:  ZOFRAN  Take 1 tablet (4 mg total) by mouth every 8 (eight) hours as needed for nausea or vomiting.     rosuvastatin 20 MG tablet  Commonly known as:  CRESTOR  Take 20 mg by mouth daily.     sitaGLIPtin 100 MG tablet  Commonly known as:  JANUVIA  Take 100 mg by mouth daily.         SignedOphelia Charter 07/08/2014, 8:56 AM

## 2014-07-08 NOTE — Plan of Care (Signed)
Problem: Consults Goal: Mild Traumatic Brain Injury Patient Education See Patient Education Module for education specifics.  Outcome: Completed/Met Date Met:  07/08/14

## 2014-07-08 NOTE — Plan of Care (Signed)
Problem: Consults Goal: Diabetes Guidelines if Diabetic/Glucose > 140 If diabetic or lab glucose is > 140 mg/dl - Initiate Diabetes/Hyperglycemia Guidelines & Document Interventions  Outcome: Not Applicable Date Met:  07/08/14     

## 2014-07-08 NOTE — Plan of Care (Signed)
Problem: Phase II Progression Outcomes Goal: Discharge plan established Outcome: Completed/Met Date Met:  07/08/14

## 2014-07-08 NOTE — Plan of Care (Signed)
Problem: Consults Goal: Skin Care Protocol Initiated - if Braden Score 18 or less If consults are not indicated, leave blank or document N/A  Outcome: Completed/Met Date Met:  07/08/14

## 2014-07-08 NOTE — Plan of Care (Signed)
Problem: Phase I Progression Outcomes Goal: No focal neurological deficits Outcome: Completed/Met Date Met:  07/08/14 At baseline.

## 2014-07-08 NOTE — Plan of Care (Signed)
Problem: Consults Goal: Nutrition Consult-if indicated Outcome: Not Applicable Date Met:  07/08/14     

## 2014-07-08 NOTE — Plan of Care (Signed)
Problem: Phase I Progression Outcomes Goal: Pain controlled with appropriate interventions Outcome: Completed/Met Date Met:  07/08/14 PO pain medicine.

## 2014-07-08 NOTE — Plan of Care (Signed)
Problem: Discharge Progression Outcomes Goal: Pain controlled with appropriate interventions Outcome: Completed/Met Date Met:  07/08/14     

## 2014-07-08 NOTE — Plan of Care (Signed)
Problem: Phase II Progression Outcomes Goal: Progress activity as tolerated unless otherwise ordered Outcome: Completed/Met Date Met:  07/08/14     

## 2014-07-09 DIAGNOSIS — M19011 Primary osteoarthritis, right shoulder: Secondary | ICD-10-CM | POA: Diagnosis not present

## 2014-07-09 DIAGNOSIS — M6281 Muscle weakness (generalized): Secondary | ICD-10-CM | POA: Diagnosis not present

## 2014-07-09 DIAGNOSIS — M25511 Pain in right shoulder: Secondary | ICD-10-CM | POA: Diagnosis not present

## 2014-07-09 DIAGNOSIS — Z96611 Presence of right artificial shoulder joint: Secondary | ICD-10-CM | POA: Diagnosis not present

## 2014-07-10 ENCOUNTER — Other Ambulatory Visit: Payer: Self-pay | Admitting: Neurosurgery

## 2014-07-10 DIAGNOSIS — Z96611 Presence of right artificial shoulder joint: Secondary | ICD-10-CM | POA: Diagnosis not present

## 2014-07-10 DIAGNOSIS — S065X9A Traumatic subdural hemorrhage with loss of consciousness of unspecified duration, initial encounter: Secondary | ICD-10-CM

## 2014-07-10 DIAGNOSIS — S065XAA Traumatic subdural hemorrhage with loss of consciousness status unknown, initial encounter: Secondary | ICD-10-CM

## 2014-07-11 DIAGNOSIS — M6281 Muscle weakness (generalized): Secondary | ICD-10-CM | POA: Diagnosis not present

## 2014-07-11 DIAGNOSIS — M25511 Pain in right shoulder: Secondary | ICD-10-CM | POA: Diagnosis not present

## 2014-07-11 DIAGNOSIS — Z96611 Presence of right artificial shoulder joint: Secondary | ICD-10-CM | POA: Diagnosis not present

## 2014-07-11 DIAGNOSIS — M19011 Primary osteoarthritis, right shoulder: Secondary | ICD-10-CM | POA: Diagnosis not present

## 2014-07-18 DIAGNOSIS — M19011 Primary osteoarthritis, right shoulder: Secondary | ICD-10-CM | POA: Diagnosis not present

## 2014-07-18 DIAGNOSIS — M9904 Segmental and somatic dysfunction of sacral region: Secondary | ICD-10-CM | POA: Diagnosis not present

## 2014-07-18 DIAGNOSIS — M9905 Segmental and somatic dysfunction of pelvic region: Secondary | ICD-10-CM | POA: Diagnosis not present

## 2014-07-18 DIAGNOSIS — M9903 Segmental and somatic dysfunction of lumbar region: Secondary | ICD-10-CM | POA: Diagnosis not present

## 2014-07-18 DIAGNOSIS — M25511 Pain in right shoulder: Secondary | ICD-10-CM | POA: Diagnosis not present

## 2014-07-18 DIAGNOSIS — Z96611 Presence of right artificial shoulder joint: Secondary | ICD-10-CM | POA: Diagnosis not present

## 2014-07-18 DIAGNOSIS — M6281 Muscle weakness (generalized): Secondary | ICD-10-CM | POA: Diagnosis not present

## 2014-07-18 DIAGNOSIS — M5136 Other intervertebral disc degeneration, lumbar region: Secondary | ICD-10-CM | POA: Diagnosis not present

## 2014-07-20 ENCOUNTER — Ambulatory Visit
Admission: RE | Admit: 2014-07-20 | Discharge: 2014-07-20 | Disposition: A | Discharge status: Home / Self Care | Payer: Medicare Other | Source: Ambulatory Visit | Attending: Neurosurgery | Admitting: Neurosurgery

## 2014-07-20 DIAGNOSIS — Z96611 Presence of right artificial shoulder joint: Secondary | ICD-10-CM | POA: Diagnosis not present

## 2014-07-20 DIAGNOSIS — M6281 Muscle weakness (generalized): Secondary | ICD-10-CM | POA: Diagnosis not present

## 2014-07-20 DIAGNOSIS — S065XAA Traumatic subdural hemorrhage with loss of consciousness status unknown, initial encounter: Secondary | ICD-10-CM

## 2014-07-20 DIAGNOSIS — R42 Dizziness and giddiness: Secondary | ICD-10-CM | POA: Diagnosis not present

## 2014-07-20 DIAGNOSIS — M25511 Pain in right shoulder: Secondary | ICD-10-CM | POA: Diagnosis not present

## 2014-07-20 DIAGNOSIS — M19011 Primary osteoarthritis, right shoulder: Secondary | ICD-10-CM | POA: Diagnosis not present

## 2014-07-20 DIAGNOSIS — S065X9A Traumatic subdural hemorrhage with loss of consciousness of unspecified duration, initial encounter: Secondary | ICD-10-CM

## 2014-07-20 DIAGNOSIS — S065X0A Traumatic subdural hemorrhage without loss of consciousness, initial encounter: Secondary | ICD-10-CM | POA: Diagnosis not present

## 2014-07-23 ENCOUNTER — Inpatient Hospital Stay (HOSPITAL_COMMUNITY): Payer: Medicare Other

## 2014-07-23 ENCOUNTER — Encounter (HOSPITAL_COMMUNITY): Payer: Self-pay | Admitting: *Deleted

## 2014-07-23 ENCOUNTER — Emergency Department (HOSPITAL_COMMUNITY): Payer: Medicare Other

## 2014-07-23 ENCOUNTER — Inpatient Hospital Stay (HOSPITAL_COMMUNITY)
Admission: EM | Admit: 2014-07-23 | Discharge: 2014-07-25 | DRG: 552 | Disposition: A | Discharge status: Home / Self Care | Payer: Medicare Other | Attending: Internal Medicine | Admitting: Internal Medicine

## 2014-07-23 DIAGNOSIS — E785 Hyperlipidemia, unspecified: Secondary | ICD-10-CM | POA: Diagnosis present

## 2014-07-23 DIAGNOSIS — K754 Autoimmune hepatitis: Secondary | ICD-10-CM | POA: Diagnosis present

## 2014-07-23 DIAGNOSIS — M6289 Other specified disorders of muscle: Secondary | ICD-10-CM

## 2014-07-23 DIAGNOSIS — R1319 Other dysphagia: Secondary | ICD-10-CM | POA: Diagnosis present

## 2014-07-23 DIAGNOSIS — M19011 Primary osteoarthritis, right shoulder: Secondary | ICD-10-CM

## 2014-07-23 DIAGNOSIS — E119 Type 2 diabetes mellitus without complications: Secondary | ICD-10-CM | POA: Diagnosis present

## 2014-07-23 DIAGNOSIS — W19XXXD Unspecified fall, subsequent encounter: Secondary | ICD-10-CM | POA: Diagnosis present

## 2014-07-23 DIAGNOSIS — R32 Unspecified urinary incontinence: Secondary | ICD-10-CM | POA: Diagnosis present

## 2014-07-23 DIAGNOSIS — Z79899 Other long term (current) drug therapy: Secondary | ICD-10-CM

## 2014-07-23 DIAGNOSIS — M1990 Unspecified osteoarthritis, unspecified site: Secondary | ICD-10-CM | POA: Diagnosis present

## 2014-07-23 DIAGNOSIS — I517 Cardiomegaly: Secondary | ICD-10-CM | POA: Diagnosis not present

## 2014-07-23 DIAGNOSIS — N3000 Acute cystitis without hematuria: Secondary | ICD-10-CM

## 2014-07-23 DIAGNOSIS — I639 Cerebral infarction, unspecified: Secondary | ICD-10-CM | POA: Diagnosis not present

## 2014-07-23 DIAGNOSIS — Z96652 Presence of left artificial knee joint: Secondary | ICD-10-CM | POA: Diagnosis present

## 2014-07-23 DIAGNOSIS — I10 Essential (primary) hypertension: Secondary | ICD-10-CM | POA: Diagnosis present

## 2014-07-23 DIAGNOSIS — M2580 Other specified joint disorders, unspecified joint: Secondary | ICD-10-CM | POA: Diagnosis present

## 2014-07-23 DIAGNOSIS — S065X0D Traumatic subdural hemorrhage without loss of consciousness, subsequent encounter: Secondary | ICD-10-CM

## 2014-07-23 DIAGNOSIS — Z9889 Other specified postprocedural states: Secondary | ICD-10-CM

## 2014-07-23 DIAGNOSIS — F419 Anxiety disorder, unspecified: Secondary | ICD-10-CM | POA: Diagnosis present

## 2014-07-23 DIAGNOSIS — Z9181 History of falling: Secondary | ICD-10-CM | POA: Diagnosis not present

## 2014-07-23 DIAGNOSIS — Z96612 Presence of left artificial shoulder joint: Secondary | ICD-10-CM | POA: Diagnosis present

## 2014-07-23 DIAGNOSIS — M6281 Muscle weakness (generalized): Secondary | ICD-10-CM | POA: Diagnosis not present

## 2014-07-23 DIAGNOSIS — M47896 Other spondylosis, lumbar region: Secondary | ICD-10-CM | POA: Diagnosis present

## 2014-07-23 DIAGNOSIS — Z6832 Body mass index (BMI) 32.0-32.9, adult: Secondary | ICD-10-CM | POA: Diagnosis not present

## 2014-07-23 DIAGNOSIS — N390 Urinary tract infection, site not specified: Secondary | ICD-10-CM | POA: Diagnosis present

## 2014-07-23 DIAGNOSIS — K580 Irritable bowel syndrome with diarrhea: Secondary | ICD-10-CM | POA: Diagnosis present

## 2014-07-23 DIAGNOSIS — E039 Hypothyroidism, unspecified: Secondary | ICD-10-CM | POA: Diagnosis present

## 2014-07-23 DIAGNOSIS — I672 Cerebral atherosclerosis: Secondary | ICD-10-CM | POA: Diagnosis not present

## 2014-07-23 DIAGNOSIS — E669 Obesity, unspecified: Secondary | ICD-10-CM | POA: Diagnosis present

## 2014-07-23 DIAGNOSIS — I359 Nonrheumatic aortic valve disorder, unspecified: Secondary | ICD-10-CM | POA: Diagnosis not present

## 2014-07-23 DIAGNOSIS — M533 Sacrococcygeal disorders, not elsewhere classified: Secondary | ICD-10-CM

## 2014-07-23 DIAGNOSIS — E780 Pure hypercholesterolemia: Secondary | ICD-10-CM | POA: Diagnosis present

## 2014-07-23 DIAGNOSIS — Z823 Family history of stroke: Secondary | ICD-10-CM | POA: Diagnosis not present

## 2014-07-23 DIAGNOSIS — Z90710 Acquired absence of both cervix and uterus: Secondary | ICD-10-CM

## 2014-07-23 DIAGNOSIS — R011 Cardiac murmur, unspecified: Secondary | ICD-10-CM | POA: Diagnosis present

## 2014-07-23 DIAGNOSIS — S065XAA Traumatic subdural hemorrhage with loss of consciousness status unknown, initial encounter: Secondary | ICD-10-CM | POA: Diagnosis present

## 2014-07-23 DIAGNOSIS — J3801 Paralysis of vocal cords and larynx, unilateral: Secondary | ICD-10-CM | POA: Diagnosis present

## 2014-07-23 DIAGNOSIS — M5126 Other intervertebral disc displacement, lumbar region: Secondary | ICD-10-CM | POA: Diagnosis not present

## 2014-07-23 DIAGNOSIS — I6200 Nontraumatic subdural hemorrhage, unspecified: Secondary | ICD-10-CM | POA: Diagnosis not present

## 2014-07-23 DIAGNOSIS — K219 Gastro-esophageal reflux disease without esophagitis: Secondary | ICD-10-CM | POA: Diagnosis present

## 2014-07-23 DIAGNOSIS — Z96611 Presence of right artificial shoulder joint: Secondary | ICD-10-CM | POA: Diagnosis present

## 2014-07-23 DIAGNOSIS — R531 Weakness: Secondary | ICD-10-CM

## 2014-07-23 DIAGNOSIS — S065X9A Traumatic subdural hemorrhage with loss of consciousness of unspecified duration, initial encounter: Secondary | ICD-10-CM

## 2014-07-23 DIAGNOSIS — Z8679 Personal history of other diseases of the circulatory system: Secondary | ICD-10-CM

## 2014-07-23 LAB — CBC
HCT: 39.4 % (ref 36.0–46.0)
Hemoglobin: 12.9 g/dL (ref 12.0–15.0)
MCH: 28.5 pg (ref 26.0–34.0)
MCHC: 32.7 g/dL (ref 30.0–36.0)
MCV: 87.2 fL (ref 78.0–100.0)
Platelets: 148 10*3/uL — ABNORMAL LOW (ref 150–400)
RBC: 4.52 MIL/uL (ref 3.87–5.11)
RDW: 14.1 % (ref 11.5–15.5)
WBC: 4.8 10*3/uL (ref 4.0–10.5)

## 2014-07-23 LAB — URINALYSIS, ROUTINE W REFLEX MICROSCOPIC
Bilirubin Urine: NEGATIVE
Glucose, UA: NEGATIVE mg/dL
Hgb urine dipstick: NEGATIVE
Ketones, ur: NEGATIVE mg/dL
Nitrite: NEGATIVE
Protein, ur: NEGATIVE mg/dL
Specific Gravity, Urine: 1.008 (ref 1.005–1.030)
Urobilinogen, UA: 0.2 mg/dL (ref 0.0–1.0)
pH: 5.5 (ref 5.0–8.0)

## 2014-07-23 LAB — DIFFERENTIAL
Basophils Absolute: 0 10*3/uL (ref 0.0–0.1)
Basophils Relative: 0 % (ref 0–1)
Eosinophils Absolute: 0.1 10*3/uL (ref 0.0–0.7)
Eosinophils Relative: 3 % (ref 0–5)
Lymphocytes Relative: 18 % (ref 12–46)
Lymphs Abs: 0.9 10*3/uL (ref 0.7–4.0)
Monocytes Absolute: 0.4 10*3/uL (ref 0.1–1.0)
Monocytes Relative: 9 % (ref 3–12)
Neutro Abs: 3.3 10*3/uL (ref 1.7–7.7)
Neutrophils Relative %: 70 % (ref 43–77)

## 2014-07-23 LAB — COMPREHENSIVE METABOLIC PANEL
ALT: 16 U/L (ref 0–35)
AST: 24 U/L (ref 0–37)
Albumin: 3.6 g/dL (ref 3.5–5.2)
Alkaline Phosphatase: 82 U/L (ref 39–117)
Anion gap: 17 — ABNORMAL HIGH (ref 5–15)
BUN: 10 mg/dL (ref 6–23)
CO2: 22 mEq/L (ref 19–32)
Calcium: 9.9 mg/dL (ref 8.4–10.5)
Chloride: 98 mEq/L (ref 96–112)
Creatinine, Ser: 0.5 mg/dL (ref 0.50–1.10)
GFR calc Af Amer: >90 mL/min (ref >90)
GFR calc non Af Amer: 89 mL/min — ABNORMAL LOW (ref >90)
Glucose, Bld: 132 mg/dL — ABNORMAL HIGH (ref 70–99)
Potassium: 4 mEq/L (ref 3.7–5.3)
Sodium: 137 mEq/L (ref 137–147)
Total Bilirubin: 0.3 mg/dL (ref 0.3–1.2)
Total Protein: 7.6 g/dL (ref 6.0–8.3)

## 2014-07-23 LAB — APTT: aPTT: 35 seconds (ref 24–37)

## 2014-07-23 LAB — GLUCOSE, CAPILLARY: Glucose-Capillary: 116 mg/dL — ABNORMAL HIGH (ref 70–99)

## 2014-07-23 LAB — URINE MICROSCOPIC-ADD ON

## 2014-07-23 LAB — I-STAT TROPONIN, ED: Troponin i, poc: 0 ng/mL (ref 0.00–0.08)

## 2014-07-23 LAB — TROPONIN I: Troponin I: <0.3 ng/mL (ref <0.30)

## 2014-07-23 LAB — PROTIME-INR
INR: 1.05 (ref 0.00–1.49)
Prothrombin Time: 13.8 seconds (ref 11.6–15.2)

## 2014-07-23 MED ORDER — SODIUM CHLORIDE 0.9 % IV SOLN
INTRAVENOUS | Status: AC
  Administered 2014-07-23: 19:00:00 via INTRAVENOUS

## 2014-07-23 MED ORDER — SODIUM CHLORIDE 0.9 % IV SOLN: INTRAVENOUS | Status: DC

## 2014-07-23 MED ORDER — ALPRAZOLAM 0.5 MG PO TABS: 1.0000 mg | ORAL_TABLET | Freq: Every evening | ORAL | Status: DC | PRN

## 2014-07-23 MED ORDER — AZELASTINE HCL 0.1 % NA SOLN
2.0000 | Freq: Every day | NASAL | Status: DC
  Filled 2014-07-23: qty 30

## 2014-07-23 MED ORDER — IRBESARTAN 150 MG PO TABS
150.0000 mg | ORAL_TABLET | Freq: Two times a day (BID) | ORAL | Status: DC
  Administered 2014-07-24 – 2014-07-25 (×3): 150 mg via ORAL
  Filled 2014-07-23 (×3): qty 1

## 2014-07-23 MED ORDER — LORAZEPAM 2 MG/ML IJ SOLN
0.5000 mg | Freq: Two times a day (BID) | INTRAMUSCULAR | Status: DC | PRN
  Administered 2014-07-23 – 2014-07-25 (×4): 0.5 mg via INTRAVENOUS
  Filled 2014-07-23 (×4): qty 1

## 2014-07-23 MED ORDER — ACETAMINOPHEN 650 MG RE SUPP
650.0000 mg | Freq: Four times a day (QID) | RECTAL | Status: DC | PRN
  Administered 2014-07-24: 650 mg via RECTAL
  Filled 2014-07-23: qty 1

## 2014-07-23 MED ORDER — ONDANSETRON HCL 4 MG/2ML IJ SOLN
4.0000 mg | Freq: Four times a day (QID) | INTRAMUSCULAR | Status: DC | PRN
  Administered 2014-07-23: 4 mg via INTRAVENOUS
  Filled 2014-07-23: qty 2

## 2014-07-23 MED ORDER — ONDANSETRON HCL 4 MG PO TABS
4.0000 mg | ORAL_TABLET | Freq: Three times a day (TID) | ORAL | Status: DC | PRN
Start: 2014-07-23 — End: 2014-07-25

## 2014-07-23 MED ORDER — FUROSEMIDE 20 MG PO TABS
20.0000 mg | ORAL_TABLET | Freq: Every day | ORAL | Status: DC
  Administered 2014-07-25: 20 mg via ORAL
  Filled 2014-07-23 (×2): qty 1

## 2014-07-23 MED ORDER — METOPROLOL TARTRATE 1 MG/ML IV SOLN
2.5000 mg | INTRAVENOUS | Status: DC | PRN
  Filled 2014-07-23 (×2): qty 5

## 2014-07-23 MED ORDER — LINAGLIPTIN 5 MG PO TABS
5.0000 mg | ORAL_TABLET | Freq: Every day | ORAL | Status: DC
  Administered 2014-07-24 – 2014-07-25 (×2): 5 mg via ORAL
  Filled 2014-07-23 (×2): qty 1

## 2014-07-23 MED ORDER — PANTOPRAZOLE SODIUM 40 MG PO TBEC
40.0000 mg | DELAYED_RELEASE_TABLET | Freq: Every day | ORAL | Status: DC
Start: 2014-07-23 — End: 2014-07-25
  Administered 2014-07-24 – 2014-07-25 (×2): 40 mg via ORAL
  Filled 2014-07-23 (×2): qty 1

## 2014-07-23 MED ORDER — STROKE: EARLY STAGES OF RECOVERY BOOK
Freq: Once | Status: AC
  Administered 2014-07-23: 20:00:00
  Filled 2014-07-23: qty 1

## 2014-07-23 MED ORDER — LEVOTHYROXINE SODIUM 25 MCG PO TABS
125.0000 ug | ORAL_TABLET | Freq: Every day | ORAL | Status: DC
  Administered 2014-07-24 – 2014-07-25 (×2): 125 ug via ORAL
  Filled 2014-07-23 (×4): qty 1

## 2014-07-23 MED ORDER — ROSUVASTATIN CALCIUM 20 MG PO TABS
20.0000 mg | ORAL_TABLET | Freq: Every day | ORAL | Status: DC
  Administered 2014-07-24 – 2014-07-25 (×2): 20 mg via ORAL
  Filled 2014-07-23 (×3): qty 1

## 2014-07-23 MED ORDER — INSULIN ASPART 100 UNIT/ML ~~LOC~~ SOLN: 0.0000 [IU] | Freq: Every day | SUBCUTANEOUS | Status: DC

## 2014-07-23 MED ORDER — INSULIN ASPART 100 UNIT/ML ~~LOC~~ SOLN
0.0000 [IU] | Freq: Three times a day (TID) | SUBCUTANEOUS | Status: DC
  Administered 2014-07-24: 3 [IU] via SUBCUTANEOUS
  Administered 2014-07-24: 2 [IU] via SUBCUTANEOUS
  Administered 2014-07-24: 5 [IU] via SUBCUTANEOUS
  Administered 2014-07-25: 3 [IU] via SUBCUTANEOUS
  Administered 2014-07-25: 5 [IU] via SUBCUTANEOUS

## 2014-07-23 MED ORDER — MORPHINE SULFATE 2 MG/ML IJ SOLN
1.0000 mg | INTRAMUSCULAR | Status: DC | PRN
  Administered 2014-07-23: 1 mg via INTRAVENOUS
  Filled 2014-07-23: qty 1

## 2014-07-23 MED ORDER — METOPROLOL SUCCINATE ER 25 MG PO TB24
50.0000 mg | ORAL_TABLET | Freq: Two times a day (BID) | ORAL | Status: DC
  Administered 2014-07-23 – 2014-07-25 (×3): 50 mg via ORAL
  Filled 2014-07-23 (×3): qty 2

## 2014-07-23 MED ORDER — SENNA 8.6 MG PO TABS: 1.0000 | ORAL_TABLET | Freq: Every day | ORAL | Status: DC | PRN

## 2014-07-23 MED ORDER — BISACODYL 5 MG PO TBEC
5.0000 mg | DELAYED_RELEASE_TABLET | Freq: Every day | ORAL | Status: DC | PRN
Start: 2014-07-23 — End: 2014-07-25

## 2014-07-23 MED ORDER — SENNOSIDES-DOCUSATE SODIUM 8.6-50 MG PO TABS: 1.0000 | ORAL_TABLET | Freq: Every evening | ORAL | Status: DC | PRN

## 2014-07-23 MED ORDER — ACETAMINOPHEN 325 MG PO TABS
650.0000 mg | ORAL_TABLET | Freq: Four times a day (QID) | ORAL | Status: DC | PRN
  Filled 2014-07-23: qty 2

## 2014-07-23 MED ORDER — ONDANSETRON HCL 4 MG PO TABS: 4.0000 mg | ORAL_TABLET | Freq: Four times a day (QID) | ORAL | Status: DC | PRN

## 2014-07-23 MED ORDER — HYDROCODONE-ACETAMINOPHEN 5-325 MG PO TABS
1.0000 | ORAL_TABLET | ORAL | Status: DC | PRN
Start: 2014-07-23 — End: 2014-07-25
  Administered 2014-07-24 (×2): 1 via ORAL
  Filled 2014-07-23 (×2): qty 1

## 2014-07-23 MED ORDER — SODIUM CHLORIDE 0.9 % IJ SOLN
3.0000 mL | Freq: Two times a day (BID) | INTRAMUSCULAR | Status: DC
  Administered 2014-07-23 – 2014-07-25 (×3): 3 mL via INTRAVENOUS

## 2014-07-23 MED ORDER — ANTIOXIDANT PO TABS: 1.0000 | ORAL_TABLET | Freq: Every day | ORAL | Status: DC

## 2014-07-23 MED ORDER — DEXTROSE 50 % IV SOLN: 25.0000 mL | Freq: Once | INTRAVENOUS | Status: AC | PRN

## 2014-07-23 NOTE — Progress Notes (Signed)
Pt arrived to unit per stretcher from ED with ED RNs and transferred to unit bed. No acute distress noted. Family at bedside.  Will monitor   Angeline Slim I 07/23/2014 5:52 PM

## 2014-07-23 NOTE — Plan of Care (Signed)
Problem: Consults Goal: Skin Care Protocol Initiated - if Braden Score 18 or less If consults are not indicated, leave blank or document N/A Outcome: Completed/Met Date Met:  07/23/14

## 2014-07-23 NOTE — Plan of Care (Signed)
Problem: Acute Treatment Outcomes Goal: Airway maintained/protected Outcome: Completed/Met Date Met:  07/23/14

## 2014-07-23 NOTE — Plan of Care (Signed)
Problem: Acute Treatment Outcomes Goal: BP within ordered parameters Outcome: Progressing

## 2014-07-23 NOTE — Consult Note (Signed)
Referring Physician: Pfeiffer    Chief Complaint:  Transient left foot weakness  HPI:                                                                                                                                         Hailey Cobb is an 80 y.o. female who was admitted to hospital between 11/21 and 11/22 fro traumatic right subdural hematoma. Patient was doing well until this AM when she noted a sudden onset of inability to move her left leg. Family states this occurred about 11 AM at this time she has resolved.  Family noted that since she has been home after her fall in November she has been "generally weak, and complaining of mild HA and dizziness".  In addition she states she feels as thought she is drifting to the right when she walks.  Currently she is complaining of dizziness and generalized weakness. Family paints a very broad picture of generalized symptoms since last discharge.  Today at 1100 AM patient states her left leg gave out on her and her right leg was weak.  She was able to hold onto her husband and they slowly helped her sit down.  Daughter states she could not lift left leg when in ED but this has improved. Patient denies any decreased sensation or AMS.   Date last known well: Date: 07/23/2014 Time last known well: Time: 11:00 tPA Given: No: recent SDH and symptoms resolved.   Past Medical History  Diagnosis Date  . Hypertension   . Diabetes mellitus without complication     Type 2   . Osteoarthritis   . S/P dilatation of esophageal stricture   . Unilateral vocal cord paralysis   . Mucus pooling in larynx     Begin after having thyroidectomy; pt takes Mucus medication nightly  . Shortness of breath     with exertion  . Anxiety   . Hypothyroidism   . GERD (gastroesophageal reflux disease)   . IBS (irritable bowel syndrome)   . Hypercholesteremia   . Vitiligo   . Heart murmur     mild AS by 01/2010 echo [facility])  . Hepatitis     Autoimmune     Past Surgical  History  Procedure Laterality Date  . Thyroidectomy  11-2008  . Joint replacement Left     Knee  . Joint replacement Left     Shoulder  . Vocal laryngoplasty      s/p left vocal fold medialization laryngoplasty with Goretex 02/07/10 (Dr. Wonda Amis)  . Elbow bursa surgery Left   . Abdominal hysterectomy    . Appendectomy    . Total shoulder arthroplasty Right 05/16/2014    Procedure: TOTAL SHOULDER ARTHROPLASTY;  Surgeon: Ninetta Lights, MD;  Location: [place];  Service: Orthopedics;  Laterality: Right;    Family History  Problem Relation Age of Onset  . Stomach cancer  Mother   . Stroke Brother   . Prostate cancer Brother   . Stroke Brother   . Multiple sclerosis Sister    Social History:  reports that she has never smoked. She has never used smokeless tobacco. She reports that she does not drink alcohol or use illicit drugs.  Allergies: No Known Allergies  Medications:                                                                                                                           No current facility-administered medications for this encounter.   Current Outpatient Prescriptions  Medication Sig Dispense Refill  . ALPRAZolam (XANAX) 0.5 MG tablet Take 1 mg by mouth at bedtime as needed for anxiety.     Marland Kitchen amLODipine (NORVASC) 10 MG tablet Take 10 mg by mouth daily.    Marland Kitchen azelastine (ASTELIN) 137 MCG/SPRAY nasal spray Place 2 sprays into both nostrils daily. Use in each nostril as directed    . doxazosin (CARDURA) 2 MG tablet Take 2 mg by mouth 2 (two) times daily.    Marland Kitchen esomeprazole (NEXIUM) 40 MG capsule Take 40 mg by mouth daily at 12 noon.    . furosemide (LASIX) 40 MG tablet Take 20 mg by mouth daily.     Marland Kitchen HYDROcodone-acetaminophen (NORCO/VICODIN) 5-325 MG per tablet Take 1 tablet by mouth every 4 (four) hours as needed for moderate pain. 50 tablet 0  . irbesartan (AVAPRO) 150 MG tablet Take 150 mg by mouth 2 (two) times daily.    Marland Kitchen levothyroxine (SYNTHROID,  LEVOTHROID) 125 MCG tablet Take 125 mcg by mouth daily before breakfast.    . metFORMIN (GLUCOPHAGE) 1000 MG tablet Take 1,000 mg by mouth 2 (two) times daily with a meal.    . metoprolol succinate (TOPROL-XL) 50 MG 24 hr tablet Take 50 mg by mouth 2 (two) times daily. Take with or immediately following a meal.    . Multiple Vitamins-Minerals (ANTIOXIDANT PO) Take 1 tablet by mouth daily.     . rosuvastatin (CRESTOR) 20 MG tablet Take 20 mg by mouth daily.    . sitaGLIPtin (JANUVIA) 100 MG tablet Take 100 mg by mouth daily.    . bisacodyl (DULCOLAX) 5 MG EC tablet Take 1 tablet (5 mg total) by mouth daily as needed for moderate constipation. (Patient not taking: Reported on 07/07/2014) 30 tablet 0  . ondansetron (ZOFRAN) 4 MG tablet Take 1 tablet (4 mg total) by mouth every 8 (eight) hours as needed for nausea or vomiting. (Patient not taking: Reported on 07/07/2014) 40 tablet 0     ROS:  History obtained from the patient  General ROS: negative for - chills, fatigue, fever, night sweats, weight gain or weight loss Psychological ROS: negative for - behavioral disorder, hallucinations, memory difficulties, mood swings or suicidal ideation Ophthalmic ROS: negative for - blurry vision, double vision, eye pain or loss of vision ENT ROS: negative for - epistaxis, nasal discharge, oral lesions, sore throat, tinnitus or vertigo Allergy and Immunology ROS: negative for - hives or itchy/watery eyes Hematological and Lymphatic ROS: negative for - bleeding problems, bruising or swollen lymph nodes Endocrine ROS: negative for - galactorrhea, hair pattern changes, polydipsia/polyuria or temperature intolerance Respiratory ROS: negative for - cough, hemoptysis, shortness of breath or wheezing Cardiovascular ROS: negative for - chest pain, dyspnea on exertion, edema or irregular  heartbeat Gastrointestinal ROS: negative for - abdominal pain, diarrhea, hematemesis, nausea/vomiting or stool incontinence Genito-Urinary ROS: negative for - dysuria, hematuria, incontinence or urinary frequency/urgency Musculoskeletal ROS: negative for - joint swelling or muscular weakness Neurological ROS: as noted in HPI Dermatological ROS: negative for rash and skin lesion changes  Neurologic Examination:                                                                                                      Blood pressure 154/59, pulse 62, temperature 97.7 F (36.5 C), temperature source Oral, resp. rate 26, height 5\' 4"  (1.626 m), weight 84.823 kg (187 lb), SpO2 95 %.   Physical Exam  Constitutional: He appears well-developed and well-nourished.  Psych: Affect appropriate to situation Eyes: No scleral injection HENT: No OP obstrucion Head: Normocephalic.  Cardiovascular: Normal rate and regular rhythm.  Respiratory: Effort normal and breath sounds normal.  GI: Soft. Bowel sounds are normal. No distension. There is no tenderness.  Skin: WDI  Neuro Exam: Mental Status: Alert, oriented, thought content appropriate.  Speech fluent without evidence of aphasia.  Able to follow 3 step commands without difficulty. Cranial Nerves: II: Discs flat bilaterally; Visual fields grossly normal, pupils equal, round, reactive to light and accommodation III,IV, VI: ptosis not present, extra-ocular motions intact bilaterally V,VII: smile symmetric, facial light touch sensation normal bilaterally VIII: hearing normal bilaterally IX,X: gag reflex present XI: bilateral shoulder shrug XII: midline tongue extension without atrophy or fasciculations  Motor: Right : Upper extremity   5/5    Left:     Upper extremity   5/5  Lower extremity   5/5     Lower extremity   5/5 Tone and bulk:normal tone throughout; no atrophy noted Sensory: Pinprick and light touch intact throughout, bilaterally Deep Tendon  Reflexes:  Right: Upper Extremity   Left: Upper extremity   biceps (C-5 to C-6) 2/4   biceps (C-5 to C-6) 2/4 tricep (C7) 2/4    triceps (C7) 2/4 Brachioradialis (C6) 2/4  Brachioradialis (C6) 2/4  Lower Extremity Lower Extremity  quadriceps (L-2 to L-4) 2/4   quadriceps (L-2 to L-4) 2/4 Achilles (S1) 1/4   Achilles (S1) 1/4  Plantars: Right: downgoing   Left: downgoing Cerebellar: normal finger-to-nose,  normal heel-to-shin test Gait: not tested due to safety.  CV: pulses palpable  throughout    Lab Results: Basic Metabolic Panel:  Recent Labs Lab 07/23/14 1432  NA 137  K 4.0  CL 98  CO2 22  GLUCOSE 132*  BUN 10  CREATININE 0.50  CALCIUM 9.9    Liver Function Tests:  Recent Labs Lab 07/23/14 1432  AST 24  ALT 16  ALKPHOS 82  BILITOT 0.3  PROT 7.6  ALBUMIN 3.6   No results for input(s): LIPASE, AMYLASE in the last 168 hours. No results for input(s): AMMONIA in the last 168 hours.  CBC:  Recent Labs Lab 07/23/14 1432  WBC 4.8  NEUTROABS 3.3  HGB 12.9  HCT 39.4  MCV 87.2  PLT 148*    Cardiac Enzymes:  Recent Labs Lab 07/23/14 1432  TROPONINI <0.30    Lipid Panel: No results for input(s): CHOL, TRIG, HDL, CHOLHDL, VLDL, LDLCALC in the last 168 hours.  CBG: No results for input(s): GLUCAP in the last 168 hours.  Microbiology: Results for orders placed or performed during the hospital encounter of 07/07/14  MRSA PCR Screening     Status: None   Collection Time: 07/07/14  2:09 PM  Result Value Ref Range Status   MRSA by PCR NEGATIVE NEGATIVE Final    Comment:        The GeneXpert MRSA Assay (FDA approved for NASAL specimens only), is one component of a comprehensive MRSA colonization surveillance program. It is not intended to diagnose MRSA infection nor to guide or monitor treatment for MRSA infections.     Coagulation Studies:  Recent Labs  07/23/14 1432  LABPROT 13.8  INR 1.05    Imaging: Ct Head Wo  Contrast  07/23/2014   CLINICAL DATA:  Subdural hematoma diagnosed 07/07/2014. Weakness in the left leg today.  EXAM: CT HEAD WITHOUT CONTRAST  TECHNIQUE: Contiguous axial images were obtained from the base of the skull through the vertex without intravenous contrast.  COMPARISON:  07/20/2014 and multiple previous  FINDINGS: Resolving subdural hematoma along the falx anteriorly continues to undergo expected evolutionary resolution. No evidence of new or worsened bleeding. As seen previously, the largest component is along the right side of the falx. This is much less distinct due to expected evolutionary changes. Minimal mass effect upon the medial aspect of the right frontal lobe persists. Elsewhere, the brain shows chronic appearing small vessel ischemic changes affecting the pons and the cerebral hemispheric white matter. No sign of acute infarction, neoplastic mass lesion or hydrocephalus. No skull fracture. Fluid is present within the air cells of the mastoid tip on the left as seen previously.  IMPRESSION: No new or acute bleeding.  No sign of new infarction.  Expected evolutionary changes of resolving subdural hematoma along the falx.   Electronically Signed   By: Nelson Chimes M.D.   On: 07/23/2014 15:43       Assessment and plan discussed with with attending physician and they are in agreement.    Etta Quill PA-C [facility] [phone]  07/23/2014, 4:14 PM   Assessment: 80 y.o. female with generalized weakness and dizzy sensation since recent fall on 11/21.  Today patient experienced a transient weakness of left leg with no AMS or seizure activity. At time of exam her left leg weakness had resolved. Given the sudden onset of symptoms, a full stroke work up and EEG to evaluate for seizure are recommended.   Stroke Risk Factors - diabetes mellitus and hypertension   Recommend: 1. HgbA1c, fasting lipid panel 2. MRI, MRA  of the  brain without contrast 3. PT consult, OT  consult, Speech consult 4. Echocardiogram 5. Carotid dopplers 6. Prophylactic therapy-None 7. Risk factor modification 8. Telemetry monitoring 9. EEG   I personally participated in this patient's evaluation and management, including formulating above clinical impression and management recommendations.  Rush Farmer M.D. [facility] [phone]

## 2014-07-23 NOTE — Progress Notes (Signed)
Report recd from [place], ED RN. Will monitor for pt's arrival to unit   Angeline Slim I 07/23/2014 5:26 PM

## 2014-07-23 NOTE — ED Provider Notes (Signed)
CSN: [id number]     Arrival date & time 07/23/14  1321 History   First MD Initiated Contact with Patient 07/23/14 1335     Chief Complaint  Patient presents with  . Extremity Weakness     (Consider location/radiation/quality/duration/timing/severity/associated sxs/prior Treatment) HPI  The patient had a recent head injury with a subdural hematoma. Since being home she's felt fairly unsteady. Today she reports that her left leg gave out from under her. She reports he was just unable to move it. She reports she was trying to do some cleaning at a window and got in to such a position that she could not use the left side of her body. It caused her to fall to the side. The patient has recently just not been feeling well. She reports she's had a lot of difficulty sleeping and been fairly anxious. She is also suffering from a significant amount of generalized weakness but not fevers or chills.  Past Medical History  Diagnosis Date  . Hypertension   . Diabetes mellitus without complication     Type 2   . Osteoarthritis   . S/P dilatation of esophageal stricture   . Unilateral vocal cord paralysis   . Mucus pooling in larynx     Begin after having thyroidectomy; pt takes Mucus medication nightly  . Shortness of breath     with exertion  . Anxiety   . Hypothyroidism   . GERD (gastroesophageal reflux disease)   . IBS (irritable bowel syndrome)   . Hypercholesteremia   . Vitiligo   . Heart murmur     mild AS by 01/2010 echo [facility])  . Hepatitis     Autoimmune    Past Surgical History  Procedure Laterality Date  . Thyroidectomy  11-2008  . Joint replacement Left     Knee  . Joint replacement Left     Shoulder  . Vocal laryngoplasty      s/p left vocal fold medialization laryngoplasty with Goretex 02/07/10 (Dr. Wonda Amis)  . Elbow bursa surgery Left   . Abdominal hysterectomy    . Appendectomy    . Total shoulder arthroplasty Right 05/16/2014    Procedure: TOTAL SHOULDER ARTHROPLASTY;   Surgeon: Ninetta Lights, MD;  Location: [place];  Service: Orthopedics;  Laterality: Right;   Family History  Problem Relation Age of Onset  . Stomach cancer Mother   . Stroke Brother   . Prostate cancer Brother   . Stroke Brother   . Multiple sclerosis Sister    History  Substance Use Topics  . Smoking status: Never Smoker   . Smokeless tobacco: Never Used  . Alcohol Use: No   OB History    No data available     Review of Systems 10 Systems reviewed and are negative for acute change except as noted in the HPI.    Allergies  Review of patient's allergies indicates no known allergies.  Home Medications   Prior to Admission medications   Medication Sig Start Date End Date Taking? Authorizing Provider  ALPRAZolam Duanne Moron) 0.5 MG tablet Take 1 mg by mouth at bedtime as needed for anxiety.    Yes Historical Provider, MD  amLODipine (NORVASC) 10 MG tablet Take 10 mg by mouth daily.   Yes Historical Provider, MD  azelastine (ASTELIN) 137 MCG/SPRAY nasal spray Place 2 sprays into both nostrils daily. Use in each nostril as directed   Yes Historical Provider, MD  doxazosin (CARDURA) 2 MG tablet Take 2 mg by mouth  2 (two) times daily.   Yes Historical Provider, MD  esomeprazole (NEXIUM) 40 MG capsule Take 40 mg by mouth daily at 12 noon.   Yes Historical Provider, MD  furosemide (LASIX) 40 MG tablet Take 20 mg by mouth daily.    Yes Historical Provider, MD  HYDROcodone-acetaminophen (NORCO/VICODIN) 5-325 MG per tablet Take 1 tablet by mouth every 4 (four) hours as needed for moderate pain. 07/08/14  Yes Newman Pies, MD  irbesartan (AVAPRO) 150 MG tablet Take 150 mg by mouth 2 (two) times daily.   Yes Historical Provider, MD  levothyroxine (SYNTHROID, LEVOTHROID) 125 MCG tablet Take 125 mcg by mouth daily before breakfast.   Yes Historical Provider, MD  metFORMIN (GLUCOPHAGE) 1000 MG tablet Take 1,000 mg by mouth 2 (two) times daily with a meal.   Yes Historical Provider, MD   metoprolol succinate (TOPROL-XL) 50 MG 24 hr tablet Take 50 mg by mouth 2 (two) times daily. Take with or immediately following a meal.   Yes Historical Provider, MD  Multiple Vitamins-Minerals (ANTIOXIDANT PO) Take 1 tablet by mouth daily.    Yes Historical Provider, MD  rosuvastatin (CRESTOR) 20 MG tablet Take 20 mg by mouth daily.   Yes Historical Provider, MD  sitaGLIPtin (JANUVIA) 100 MG tablet Take 100 mg by mouth daily.   Yes Historical Provider, MD  bisacodyl (DULCOLAX) 5 MG EC tablet Take 1 tablet (5 mg total) by mouth daily as needed for moderate constipation. Patient not taking: Reported on 07/07/2014 05/16/14   M. Doran Stabler, PA-C  ondansetron (ZOFRAN) 4 MG tablet Take 1 tablet (4 mg total) by mouth every 8 (eight) hours as needed for nausea or vomiting. Patient not taking: Reported on 07/07/2014 05/16/14   M. Doran Stabler, PA-C   BP 146/50 mmHg  Pulse 61  Temp(Src) 98.4 F (36.9 C) (Oral)  Resp 18  Ht 5\' 4"  (1.626 m)  Wt 187 lb (84.823 kg)  BMI 32.08 kg/m2  SpO2 92% Physical Exam  Constitutional: She is oriented to person, place, and time.  The patient is alert and nontoxic. She has no respiratory distress.  HENT:  Patient has staples in the posterior scalp. No acute-appearing injury.  Eyes: EOM are normal. Pupils are equal, round, and reactive to light. Right eye exhibits no discharge. Left eye exhibits no discharge.  Neck: Neck supple.  Cardiovascular: Normal rate, regular rhythm, normal heart sounds and intact distal pulses.   Pulmonary/Chest: Effort normal and breath sounds normal. No respiratory distress. She has no wheezes. She has no rales.  Abdominal: Soft. She exhibits no distension. There is no tenderness. There is no guarding.  Musculoskeletal: She exhibits no edema or tenderness.  Neurological: She is alert and oriented to person, place, and time. No cranial nerve deficit.  Patient can perform upper extremity testing for grip strength and push pulls  symmetrically. She can elevate each lower extremity off of the bed and hold it for 5 seconds. I cannot appreciate any difference in strength testing on the right and the lower extremities.  Skin: Skin is warm and dry.  Psychiatric: She has a normal mood and affect.    ED Course  Procedures (including critical care time) Labs Review Labs Reviewed  CBC - Abnormal; Notable for the following:    Platelets 148 (*)    All other components within normal limits  COMPREHENSIVE METABOLIC PANEL - Abnormal; Notable for the following:    Glucose, Bld 132 (*)    GFR calc non Af Amer 89 (*)  Anion gap 17 (*)    All other components within normal limits  URINALYSIS, ROUTINE W REFLEX MICROSCOPIC - Abnormal; Notable for the following:    Leukocytes, UA TRACE (*)    All other components within normal limits  CBC - Abnormal; Notable for the following:    Hemoglobin 11.1 (*)    HCT 34.7 (*)    All other components within normal limits  GLUCOSE, CAPILLARY - Abnormal; Notable for the following:    Glucose-Capillary 116 (*)    All other components within normal limits  GLUCOSE, CAPILLARY - Abnormal; Notable for the following:    Glucose-Capillary 175 (*)    All other components within normal limits  CULTURE, BLOOD (ROUTINE X 2)  CULTURE, BLOOD (ROUTINE X 2)  URINE CULTURE  PROTIME-INR  APTT  DIFFERENTIAL  TROPONIN I  URINE MICROSCOPIC-ADD ON  LIPID PANEL  HEMOGLOBIN A1C  I-STAT TROPOININ, ED    Imaging Review Dg Chest 2 View  07/23/2014   CLINICAL DATA:  Acute stroke symptoms, hypertension and diabetes, acute intracranial hemorrhage  EXAM: CHEST - 2 VIEW  COMPARISON:  05/07/2014  FINDINGS: Stable cardiomegaly without CHF or pneumonia. No focal collapse, consolidation, or edema. No effusion or pneumothorax. Trachea midline. Postop changes from bilateral shoulder arthroplasties and thyroidectomy. No acute osseous finding. Lower thoracic and lumbar degenerative changes.  IMPRESSION: Cardiomegaly  without acute process.  Stable exam.   Electronically Signed   By: Daryll Brod M.D.   On: 07/23/2014 20:09   Ct Head Wo Contrast  07/23/2014   CLINICAL DATA:  Subdural hematoma diagnosed 07/07/2014. Weakness in the left leg today.  EXAM: CT HEAD WITHOUT CONTRAST  TECHNIQUE: Contiguous axial images were obtained from the base of the skull through the vertex without intravenous contrast.  COMPARISON:  07/20/2014 and multiple previous  FINDINGS: Resolving subdural hematoma along the falx anteriorly continues to undergo expected evolutionary resolution. No evidence of new or worsened bleeding. As seen previously, the largest component is along the right side of the falx. This is much less distinct due to expected evolutionary changes. Minimal mass effect upon the medial aspect of the right frontal lobe persists. Elsewhere, the brain shows chronic appearing small vessel ischemic changes affecting the pons and the cerebral hemispheric white matter. No sign of acute infarction, neoplastic mass lesion or hydrocephalus. No skull fracture. Fluid is present within the air cells of the mastoid tip on the left as seen previously.  IMPRESSION: No new or acute bleeding.  No sign of new infarction.  Expected evolutionary changes of resolving subdural hematoma along the falx.   Electronically Signed   By: Nelson Chimes M.D.   On: 07/23/2014 15:43   Mr Brain Wo Contrast  07/24/2014   CLINICAL DATA:  Persistent dizziness and imbalance following hospitalization 2 weeks ago for subdural hematoma. Increased confusion and disorientation. Sudden onset of left-sided weakness. Question CVA.  EXAM: MRI HEAD WITHOUT CONTRAST  MRA HEAD WITHOUT CONTRAST  TECHNIQUE: Multiplanar, multiecho pulse sequences of the brain and surrounding structures were obtained without intravenous contrast. Angiographic images of the head were obtained using MRA technique without contrast.  COMPARISON:  CT head without contrast 07/23/2014.  FINDINGS: MRI HEAD  FINDINGS  The subdural hematoma adjacent to the medial right frontal lobe is again seen. The diffusion-weighted images demonstrate no acute or subacute infarct. No new hemorrhage is present.  A small amount of subarachnoid hemorrhage is suggested over the right frontal convexity adjacent to the subdural collection. There is an arachnoid cyst  adjacent to the hemorrhage as well, a chronic finding.  Moderate periventricular and scattered subcortical white matter changes are evident bilaterally.  Flow is present in the major intracranial arteries. The patient is status post bilateral lens replacements. The globes and orbits are otherwise intact. Artifact is present from the right maxilla. The paranasal sinuses are clear. There is fluid in the mastoid air cells bilaterally. No obstructing nasopharyngeal lesion is evident.  MRA HEAD FINDINGS  Atherosclerotic changes are present in the cavernous internal carotid arteries bilaterally, worse on the right. Signal loss in the anterior genu of the right cavernous internal carotid artery may be artifactually exaggerated by adjacent susceptibility artifact. The A1 and M1 segments are normal. The anterior communicating artery is patent. The MCA bifurcations are intact. ACA and MCA branch vessels are within normal limits.  The left vertebral artery is the dominant vessel. The PICA origins are visualized and normal bilaterally. The basilar artery is within normal limits. Both posterior cerebral arteries originate from the basilar tip. The PCA branch vessels are within normal limits.  IMPRESSION: 1. Persistent medial right subdural hematoma, improved from 2 weeks ago. 2. Probable adjacent subarachnoid blood. 3. No acute hemorrhage. 4. Moderate atrophy and white matter disease bilaterally. 5. Bilateral mastoid effusions. No obstructing nasopharyngeal lesion is evident. 6. Atherosclerotic changes in the cavernous internal carotid arteries bilaterally. The degree of narrowing is likely  exaggerated on the right due to adjacent susceptibility. 7. Otherwise normal MRA circle of Willis without evidence for significant proximal stenosis, aneurysm, or branch vessel occlusion.   Electronically Signed   By: Lawrence Santiago M.D.   On: 07/24/2014 09:03   Mr Lumbar Spine Wo Contrast  07/24/2014   CLINICAL DATA:  Sacral back pain. Intracranial subdural hematoma. Dizziness. Weakness. Urinary incontinence. Low back pain limiting mobility since a recent fall.  EXAM: MRI LUMBAR SPINE WITHOUT CONTRAST  TECHNIQUE: Multiplanar, multisequence MR imaging of the lumbar spine was performed. No intravenous contrast was administered.  COMPARISON:  07/07/2014; 11/04/2005  FINDINGS: Mildly transitional L5 vertebra. The conus medullaris appears normal. Conus level: L1.  There is 3 mm degenerative retrolisthesis at L1- 2 and L3-4 ; 5 mm degenerative retrolisthesis at L2-3; and 6 mm degenerative anterolisthesis at L4-5. Loss of disc height at all levels in the lumbar spine but most especially at L5-S 1. There is also considerable loss of disc height at T11-12. Type 1 degenerative endplate findings observed at L2-3 and L5-S1. Degenerative facet edema is present at multiple levels but particularly at L4-5.  Despite efforts by the technologist and patient, motion artifact is present on today's exam and could not be eliminated. This reduces exam sensitivity and specificity.  Additional findings at individual levels are as follows:  T10-11: Borderline central narrowing of the thecal sac due to small central disc protrusion. Bilateral facet arthropathy. This level is only included on the parasagittal images.  T11-12:  No impingement.  Disc osteophyte complex noted.  T12-L1:  No impingement.  Mild disc bulge.  L1-2: No overt impingement. Disc bulge and facet arthropathy noted.  L2-3: Mild right and borderline left foraminal stenosis with mild bilateral subarticular lateral recess stenosis and mild displacement of the left L3 nerve  in the lateral extraforaminal space due to disc bulge, facet arthropathy, and ligamentum flavum redundancy.  L3-4: Mild bilateral foraminal stenosis with mild right subarticular lateral recess stenosis and mild central narrowing of the thecal sac due to disc bulge, right foraminal disc protrusion, intervertebral spurring, and facet arthropathy.  L4-5: Prominent  central narrowing of the thecal sac with moderate right and mild left subarticular lateral recess stenosis and mild bilateral foraminal stenosis due to disc uncovering, facet arthropathy, and ligamentum flavum redundancy.  L5-S1: Mild left foraminal stenosis with mild bilateral subarticular lateral recess stenosis and displacement of the left L5 nerve in the lateral extraforaminal space due to facet arthropathy, intervertebral spurring, disc bulge, and a left lateral extraforaminal disc protrusion.  No edema centrally in the sacrum.  The coccyx was not included.  IMPRESSION: 1. Lumbar spondylosis and degenerative disc disease, causing prominent impingement at L4-5 and mild impingement at L2-3, L3-4, and L5-S1, as detailed above. 2. Multilevel degenerative subluxations in the lumbar spine.   Electronically Signed   By: Sherryl Barters M.D.   On: 07/24/2014 09:02   Mr Jodene Nam Head/brain Wo Cm  07/24/2014   CLINICAL DATA:  Persistent dizziness and imbalance following hospitalization 2 weeks ago for subdural hematoma. Increased confusion and disorientation. Sudden onset of left-sided weakness. Question CVA.  EXAM: MRI HEAD WITHOUT CONTRAST  MRA HEAD WITHOUT CONTRAST  TECHNIQUE: Multiplanar, multiecho pulse sequences of the brain and surrounding structures were obtained without intravenous contrast. Angiographic images of the head were obtained using MRA technique without contrast.  COMPARISON:  CT head without contrast 07/23/2014.  FINDINGS: MRI HEAD FINDINGS  The subdural hematoma adjacent to the medial right frontal lobe is again seen. The diffusion-weighted  images demonstrate no acute or subacute infarct. No new hemorrhage is present.  A small amount of subarachnoid hemorrhage is suggested over the right frontal convexity adjacent to the subdural collection. There is an arachnoid cyst adjacent to the hemorrhage as well, a chronic finding.  Moderate periventricular and scattered subcortical white matter changes are evident bilaterally.  Flow is present in the major intracranial arteries. The patient is status post bilateral lens replacements. The globes and orbits are otherwise intact. Artifact is present from the right maxilla. The paranasal sinuses are clear. There is fluid in the mastoid air cells bilaterally. No obstructing nasopharyngeal lesion is evident.  MRA HEAD FINDINGS  Atherosclerotic changes are present in the cavernous internal carotid arteries bilaterally, worse on the right. Signal loss in the anterior genu of the right cavernous internal carotid artery may be artifactually exaggerated by adjacent susceptibility artifact. The A1 and M1 segments are normal. The anterior communicating artery is patent. The MCA bifurcations are intact. ACA and MCA branch vessels are within normal limits.  The left vertebral artery is the dominant vessel. The PICA origins are visualized and normal bilaterally. The basilar artery is within normal limits. Both posterior cerebral arteries originate from the basilar tip. The PCA branch vessels are within normal limits.  IMPRESSION: 1. Persistent medial right subdural hematoma, improved from 2 weeks ago. 2. Probable adjacent subarachnoid blood. 3. No acute hemorrhage. 4. Moderate atrophy and white matter disease bilaterally. 5. Bilateral mastoid effusions. No obstructing nasopharyngeal lesion is evident. 6. Atherosclerotic changes in the cavernous internal carotid arteries bilaterally. The degree of narrowing is likely exaggerated on the right due to adjacent susceptibility. 7. Otherwise normal MRA circle of Willis without  evidence for significant proximal stenosis, aneurysm, or branch vessel occlusion.   Electronically Signed   By: Lawrence Santiago M.D.   On: 07/24/2014 09:03     EKG Interpretation None     CONSULT:Dr. Nicole Kindred. Evaluation for CVA vs Seizure. MDM   Final diagnoses:  Acute left-sided weakness  History of subdural hematoma   The patient's case has been reviewed by neurology. At this  point time she will be admitted for further workup for possible seizure versus CVA/TIA. The patient's mental status is clear at this point in time in her exam is nonlocalizing.    Charlesetta Shanks, MD 07/24/14 [phone]

## 2014-07-23 NOTE — Plan of Care (Signed)
Problem: Acute Treatment Outcomes Goal: 02 Sats > 94% Outcome: Completed/Met Date Met:  07/23/14

## 2014-07-23 NOTE — H&P (Addendum)
[facility] History and Physical  Hailey Cobb DGU:[id number] DOB: 13-Sep-1931 DOA: 07/23/2014  Referring physician: Dr. Vallery Ridge PCP: Gennette Pac, MD   Chief Complaint:  Acute Left-sided weakness  HPI:  80 year old female with history of hypertension, type 2 diabetes mellitus, anxiety, esophageal dysphagia, hypothyroidism, who was recently hospitalized 2 weeks back with a subdural hematoma and discharged after monitoring. Patient and her daughter report that she has not been steady since the discharge and has been swaying to her sides during ambulation. Daughter also reports that she has not been very alert and oriented since then. She also has been reporting feeling dizzy all the time. This morning when she was walking around the house she felt unsteady on her left side and began to fall. She held onto her husband and slumped on the ground. She denies any syncope or feeling like passing out . She denied any chest pain, headache, fever, chills, nausea, vomiting, palpitations, shortness of breath, abdominal pain, dysuria. Patient reports she had an episode of urinary incontinence 2 days back and today in the ED. She also reports 3 episodes of watery diarrhea 2 days back. Patient reports low back pain that is limiting her mobility since her recent fall. Patient had x-ray of the sacrum and coccyx done during previous hospitalization which was negative.   course in the ED Patient's vitals were stable. Blood work done showed normal CBC, normal chemistry, negative troponin and normal blood glucose. Head CT showed no new or acute bleeding or signs of infarct. Also showed resolving subdural hematoma. UA was positive for UTI. While in the ED her left leg weakness in to have resolved.   neuro hospitalist was consulted for evaluation and recommended full workup for CVA and seizures. Hospitalist consulted for admission. Patient cleared bedside swallowing eval in the ED.  Review of Systems:   Constitutional: Denies fever, chills, diaphoresis, appetite change ,  fatigue+.  HEENT: Denivisual or hearing symptoms, congestion,  chronic dysphagia, denies neck pain Respiratory: Denies SOB, DOE, cough, chest tightness,  and wheezing.   Cardiovascular: Denies chest pain, palpitations and leg swelling.  Gastrointestinal: Denies nausea, vomiting, abdominal pain,  constipation, blood in stool and abdominal distention.  Genitourinary:  irregular urinary incontinence, Denies dysuria, urgency, frequency, hematuria, flank pain and difficulty urinating.  Endocrine: Denies: hot or cold intolerance,  polyuria, polydipsia. Musculoskeletal:  low back pain, gait instability, Denies myalgias, joint swelling, arthralgias and Skin: Denies  rash and wound.  Neurological:  dizziness, weakness, unsteadiness and fall, Denies  seizures, syncope,  numbness and headaches.  Psychiatric/Behavioral: confusion+ Past Medical History  Diagnosis Date  . Hypertension   . Diabetes mellitus without complication     Type 2   . Osteoarthritis   . S/P dilatation of esophageal stricture   . Unilateral vocal cord paralysis   . Mucus pooling in larynx     Begin after having thyroidectomy; pt takes Mucus medication nightly  . Shortness of breath     with exertion  . Anxiety   . Hypothyroidism   . GERD (gastroesophageal reflux disease)   . IBS (irritable bowel syndrome)   . Hypercholesteremia   . Vitiligo   . Heart murmur     mild AS by 01/2010 echo [facility])  . Hepatitis     Autoimmune    Past Surgical History  Procedure Laterality Date  . Thyroidectomy  11-2008  . Joint replacement Left     Knee  . Joint replacement Left     Shoulder  . Vocal  laryngoplasty      s/p left vocal fold medialization laryngoplasty with Goretex 02/07/10 (Dr. Wonda Amis)  . Elbow bursa surgery Left   . Abdominal hysterectomy    . Appendectomy    . Total shoulder arthroplasty Right 05/16/2014    Procedure: TOTAL SHOULDER ARTHROPLASTY;   Surgeon: Ninetta Lights, MD;  Location: [facility];  Service: Orthopedics;  Laterality: Right;   Social History:  reports that she has never smoked. She has never used smokeless tobacco. She reports that she does not drink alcohol or use illicit drugs.  No Known Allergies  Family History  Problem Relation Age of Onset  . Stomach cancer Mother   . Stroke Brother   . Prostate cancer Brother   . Stroke Brother   . Multiple sclerosis Sister     Prior to Admission medications   Medication Sig Start Date End Date Taking? Authorizing Provider  ALPRAZolam Duanne Moron) 0.5 MG tablet Take 1 mg by mouth at bedtime as needed for anxiety.    Yes Historical Provider, MD  amLODipine (NORVASC) 10 MG tablet Take 10 mg by mouth daily.   Yes Historical Provider, MD  azelastine (ASTELIN) 137 MCG/SPRAY nasal spray Place 2 sprays into both nostrils daily. Use in each nostril as directed   Yes Historical Provider, MD  doxazosin (CARDURA) 2 MG tablet Take 2 mg by mouth 2 (two) times daily.   Yes Historical Provider, MD  esomeprazole (NEXIUM) 40 MG capsule Take 40 mg by mouth daily at 12 noon.   Yes Historical Provider, MD  furosemide (LASIX) 40 MG tablet Take 20 mg by mouth daily.    Yes Historical Provider, MD  HYDROcodone-acetaminophen (NORCO/VICODIN) 5-325 MG per tablet Take 1 tablet by mouth every 4 (four) hours as needed for moderate pain. 07/08/14  Yes Newman Pies, MD  irbesartan (AVAPRO) 150 MG tablet Take 150 mg by mouth 2 (two) times daily.   Yes Historical Provider, MD  levothyroxine (SYNTHROID, LEVOTHROID) 125 MCG tablet Take 125 mcg by mouth daily before breakfast.   Yes Historical Provider, MD  metFORMIN (GLUCOPHAGE) 1000 MG tablet Take 1,000 mg by mouth 2 (two) times daily with a meal.   Yes Historical Provider, MD  metoprolol succinate (TOPROL-XL) 50 MG 24 hr tablet Take 50 mg by mouth 2 (two) times daily. Take with or immediately following a meal.   Yes Historical Provider, MD  Multiple  Vitamins-Minerals (ANTIOXIDANT PO) Take 1 tablet by mouth daily.    Yes Historical Provider, MD  rosuvastatin (CRESTOR) 20 MG tablet Take 20 mg by mouth daily.   Yes Historical Provider, MD  sitaGLIPtin (JANUVIA) 100 MG tablet Take 100 mg by mouth daily.   Yes Historical Provider, MD  bisacodyl (DULCOLAX) 5 MG EC tablet Take 1 tablet (5 mg total) by mouth daily as needed for moderate constipation. Patient not taking: Reported on 07/07/2014 05/16/14   M. Doran Stabler, PA-C  ondansetron (ZOFRAN) 4 MG tablet Take 1 tablet (4 mg total) by mouth every 8 (eight) hours as needed for nausea or vomiting. Patient not taking: Reported on 07/07/2014 05/16/14   M. Doran Stabler, PA-C     Physical Exam:  Filed Vitals:   07/23/14 1445 07/23/14 1500 07/23/14 1549 07/23/14 1653  BP: 140/57  154/59   Pulse: 56 63 62   Temp:    98.1 F (36.7 C)  TempSrc:      Resp: 22 25 26    Height:      Weight:      SpO2:  97% 96% 95%     Constitutional: Vital signs reviewed.   elderly female lying in bed appears fatigued HEENT: no pallor, no icterus, moist oral mucosa, no cervical lymphadenopathy, staples over scalp Cardiovascular: RRR, S1 normal, S2 normal, no MRG Chest: CTAB, no wheezes, rales, or rhonchi Abdominal: Soft. Non-tender, non-distended, bowel sounds are normal,  Ext: warm, no edema, tender to pressure over her lower back back and sacral area , rectal tone present  Neurological: A&O x3,. Another stool-12 intact, normal strength in all extremities, plantars downgoing bilaterally, normal sensations, cerebellar function and gait not assessed  Labs on Admission:  Basic Metabolic Panel:  Recent Labs Lab 07/23/14 1432  NA 137  K 4.0  CL 98  CO2 22  GLUCOSE 132*  BUN 10  CREATININE 0.50  CALCIUM 9.9   Liver Function Tests:  Recent Labs Lab 07/23/14 1432  AST 24  ALT 16  ALKPHOS 82  BILITOT 0.3  PROT 7.6  ALBUMIN 3.6   No results for input(s): LIPASE, AMYLASE in the last 168 hours. No  results for input(s): AMMONIA in the last 168 hours. CBC:  Recent Labs Lab 07/23/14 1432  WBC 4.8  NEUTROABS 3.3  HGB 12.9  HCT 39.4  MCV 87.2  PLT 148*   Cardiac Enzymes:  Recent Labs Lab 07/23/14 1432  TROPONINI <0.30   BNP: Invalid input(s): POCBNP CBG: No results for input(s): GLUCAP in the last 168 hours.  Radiological Exams on Admission: Ct Head Wo Contrast  07/23/2014   CLINICAL DATA:  Subdural hematoma diagnosed 07/07/2014. Weakness in the left leg today.  EXAM: CT HEAD WITHOUT CONTRAST  TECHNIQUE: Contiguous axial images were obtained from the base of the skull through the vertex without intravenous contrast.  COMPARISON:  07/20/2014 and multiple previous  FINDINGS: Resolving subdural hematoma along the falx anteriorly continues to undergo expected evolutionary resolution. No evidence of new or worsened bleeding. As seen previously, the largest component is along the right side of the falx. This is much less distinct due to expected evolutionary changes. Minimal mass effect upon the medial aspect of the right frontal lobe persists. Elsewhere, the brain shows chronic appearing small vessel ischemic changes affecting the pons and the cerebral hemispheric white matter. No sign of acute infarction, neoplastic mass lesion or hydrocephalus. No skull fracture. Fluid is present within the air cells of the mastoid tip on the left as seen previously.  IMPRESSION: No new or acute bleeding.  No sign of new infarction.  Expected evolutionary changes of resolving subdural hematoma along the falx.   Electronically Signed   By: Nelson Chimes M.D.   On: 07/23/2014 15:43    EKG:  normal sinus rhythm at 62, right bundle branch block, no old EKG to compare   Assessment/Plan   Principal problem  Acute CVA versus seizures Admit to telemetry Neurochecks every 2 hours Head CT on admission negative for acute infarct or bleed. Shows resolving hematoma. MRI brain, MRA head ordered. Check 2-D EF:  Carotid Dopplers. Avoiding prophylactic antiplatelet therapy given recent head bleed. -Check A1c and lipid panel -allow permissive hypertension until stroke ruled out -PT/OT eval in a.m. -Check EEG    Active Problems:   Subdural hematoma Recent hospitalization for the same.  head CT shows no new bleed and resolving hematoma. Seen by Dr. Arnoldo Morale during recent hospitalization. Had recommended for staples removal and he should be notified of patient's hospitalization to evaluate her.  UTI Has some urine incontinence symptoms. UA positive. Check urine culture. I'll  place her on empiric Rocephin  Persistent low back pain X-ray done 2 weeks back after the fall was negative for fracture of the sacrum or coccyx. Patient denies any radiation of pain and tingling or numbness in her legs. Reports 2 episodes of urinary incontinence for past 2 days. Denies any bowel symptoms. Will obtain MRI of the lumbosacral spine to evaluate further.   Type 2 diabetes mellitus Hold metformin. Will place on tradjenta and SSI    Anxiety Continue when necessary Xanax    Hypothyroidism Continue Synthroid    Essential hypertension On multiple blood pressure medications Allow permissive blood pressure. Continue beta blocker and Lasix. Hold amlodipine and doxazosin       Diet:cardiac/heart healthy   DVT prophylaxis : SCDs  Code Status: full code Family Communication: discussed with daughter at bedside disposition/plan.  Admit to inpatient   Louellen Molder [facility] Pager [phone]  Total time spent on admission :70 minutes  If 7PM-7AM, please contact night-coverage [email or web address] Password [facility] 07/23/2014, 5:24 PM

## 2014-07-23 NOTE — ED Notes (Signed)
Per [facility] pt went outside to help husband with Christmas decorations, and complained about weakness in her Left leg. She went to raise her left foot but couldn't get it to move.  This issue shortly resolved on its own. Reported a recent fall of 2 weeks ago, treated in ICU b/c of "bleeding on brain in frontal lobe."  CBG 125. BP: 164/68, HR: 80 R: 18. No LOC, pt did report diarrhea yesterday that resolved this morning. A&O x 4.

## 2014-07-24 ENCOUNTER — Inpatient Hospital Stay (HOSPITAL_COMMUNITY): Payer: Medicare Other

## 2014-07-24 DIAGNOSIS — I359 Nonrheumatic aortic valve disorder, unspecified: Secondary | ICD-10-CM

## 2014-07-24 DIAGNOSIS — F419 Anxiety disorder, unspecified: Secondary | ICD-10-CM

## 2014-07-24 DIAGNOSIS — R531 Weakness: Secondary | ICD-10-CM | POA: Insufficient documentation

## 2014-07-24 DIAGNOSIS — E119 Type 2 diabetes mellitus without complications: Secondary | ICD-10-CM

## 2014-07-24 DIAGNOSIS — E039 Hypothyroidism, unspecified: Secondary | ICD-10-CM

## 2014-07-24 LAB — CBC
HCT: 34.7 % — ABNORMAL LOW (ref 36.0–46.0)
Hemoglobin: 11.1 g/dL — ABNORMAL LOW (ref 12.0–15.0)
MCH: 28 pg (ref 26.0–34.0)
MCHC: 32 g/dL (ref 30.0–36.0)
MCV: 87.6 fL (ref 78.0–100.0)
Platelets: 160 10*3/uL (ref 150–400)
RBC: 3.96 MIL/uL (ref 3.87–5.11)
RDW: 14.2 % (ref 11.5–15.5)
WBC: 4.1 10*3/uL (ref 4.0–10.5)

## 2014-07-24 LAB — LIPID PANEL
Cholesterol: 132 mg/dL (ref 0–200)
HDL: 48 mg/dL (ref >39)
LDL Cholesterol: 61 mg/dL (ref 0–99)
Total CHOL/HDL Ratio: 2.8 RATIO
Triglycerides: 113 mg/dL (ref <150)
VLDL: 23 mg/dL (ref 0–40)

## 2014-07-24 LAB — GLUCOSE, CAPILLARY
Glucose-Capillary: 175 mg/dL — ABNORMAL HIGH (ref 70–99)
Glucose-Capillary: 137 mg/dL — ABNORMAL HIGH (ref 70–99)
Glucose-Capillary: 209 mg/dL — ABNORMAL HIGH (ref 70–99)
Glucose-Capillary: 115 mg/dL — ABNORMAL HIGH (ref 70–99)

## 2014-07-24 LAB — URINE CULTURE: Colony Count: >=100000

## 2014-07-24 LAB — HEMOGLOBIN A1C
Hgb A1c MFr Bld: 7.5 % — ABNORMAL HIGH (ref <5.7)
Mean Plasma Glucose: 169 mg/dL — ABNORMAL HIGH (ref <117)

## 2014-07-24 MED ORDER — CEFTRIAXONE SODIUM IN DEXTROSE 20 MG/ML IV SOLN
1.0000 g | INTRAVENOUS | Status: DC
  Administered 2014-07-24 – 2014-07-25 (×2): 1 g via INTRAVENOUS
  Filled 2014-07-24 (×2): qty 50

## 2014-07-24 NOTE — Progress Notes (Signed)
Nurse removed 5 staples from head. Pt tolerated well. Surgical incision well approximated. No drainage noted.   Angeline Slim I 07/24/2014 3:03 PM

## 2014-07-24 NOTE — Progress Notes (Signed)
PT Cancellation Note  Patient Details Name: Hailey Cobb MRN: [id number] DOB: Dec 28, 1931   Cancelled Treatment:    Reason Eval/Treat Not Completed: Patient at procedure or test/unavailablePt off floor for test. Will follow up in PM if time allows or next available time.   Candy Sledge A 07/24/2014, 10:38 AM Candy Sledge, PT, DPT [phone]

## 2014-07-24 NOTE — Evaluation (Signed)
SLP Cancellation Note  Patient Details Name: Hailey Cobb MRN: [id number] DOB: 04/04/1932   Cancelled treatment:       Reason Eval/Treat Not Completed: Patient at procedure or test/unavailable (spoke to RN and provided alternative therapist number to page when pt returns from testing)   Luanna Salk, [facility] SLP [phone]

## 2014-07-24 NOTE — Plan of Care (Signed)
Problem: Discharge/Transitional Outcomes Goal: Independent mobility/functioning independent or with min Independent mobility/functioning independently or with minimal assistance  Outcome: Completed/Met Date Met:  07/24/14

## 2014-07-24 NOTE — Progress Notes (Signed)
STROKE TEAM PROGRESS NOTE   HISTORY Hailey TREIBER is an 80 y.o. female who was admitted to hospital between 11/21 and 11/22 for traumatic right subdural hematoma. Patient was doing well until this AM 07/23/2017 when she noted a sudden onset of inability to move her left leg. Family states this occurred about 11 AM at this time she has resolved. Family noted that since she has been home after her fall in November she has been "generally weak, and complaining of mild HA and dizziness". In addition she states she feels as thought she is drifting to the right when she walks. Currently she is complaining of dizziness and generalized weakness. Family paints a very broad picture of generalized symptoms since last discharge. Today 07/23/2014 at 1100 AM patient states her left leg gave out on her and her right leg was weak. She was able to hold onto her husband and they slowly helped her sit down. Daughter states she could not lift left leg when in ED but this has improved. Patient denies any decreased sensation or AMS. Patient was not administered TPA secondary to recent SDH and symptoms resolved. She was admitted for further evaluation and treatment.   SUBJECTIVE (INTERVAL HISTORY) Her husband is at the bedside.  Overall she feels her condition is gradually improving. She complains of headaches since the fall.   OBJECTIVE Temp:  [97.7 F (36.5 C)-98.4 F (36.9 C)] 98.4 F (36.9 C) (12/08 0554) Pulse Rate:  [54-78] 62 (12/08 1317) Cardiac Rhythm:  [-] Normal sinus rhythm (12/08 0554) Resp:  [18-26] 18 (12/08 0554) BP: (94-180)/(44-66) 146/50 mmHg (12/08 0554) SpO2:  [92 %-100 %] 92 % (12/08 0554)   Recent Labs Lab 07/23/14 2128 07/24/14 0912 07/24/14 1150  GLUCAP 116* 175* 137*    Recent Labs Lab 07/23/14 1432  NA 137  K 4.0  CL 98  CO2 22  GLUCOSE 132*  BUN 10  CREATININE 0.50  CALCIUM 9.9    Recent Labs Lab 07/23/14 1432  AST 24  ALT 16  ALKPHOS 82  BILITOT 0.3  PROT  7.6  ALBUMIN 3.6    Recent Labs Lab 07/23/14 1432 07/24/14 0450  WBC 4.8 4.1  NEUTROABS 3.3  --   HGB 12.9 11.1*  HCT 39.4 34.7*  MCV 87.2 87.6  PLT 148* 160    Recent Labs Lab 07/23/14 1432  TROPONINI <0.30    Recent Labs  07/23/14 1432  LABPROT 13.8  INR 1.05    Recent Labs  07/23/14 1445  COLORURINE YELLOW  LABSPEC 1.008  PHURINE 5.5  GLUCOSEU NEGATIVE  HGBUR NEGATIVE  BILIRUBINUR NEGATIVE  KETONESUR NEGATIVE  PROTEINUR NEGATIVE  UROBILINOGEN 0.2  NITRITE NEGATIVE  LEUKOCYTESUR TRACE*       Component Value Date/Time   CHOL 132 07/24/2014 0450   TRIG 113 07/24/2014 0450   HDL 48 07/24/2014 0450   CHOLHDL 2.8 07/24/2014 0450   VLDL 23 07/24/2014 0450   LDLCALC 61 07/24/2014 0450   No results found for: HGBA1C No results found for: LABOPIA, COCAINSCRNUR, LABBENZ, AMPHETMU, THCU, LABBARB  No results for input(s): ETH in the last 168 hours.  Dg Chest 2 View  07/23/2014   CLINICAL DATA:  Acute stroke symptoms, hypertension and diabetes, acute intracranial hemorrhage  EXAM: CHEST - 2 VIEW  COMPARISON:  05/07/2014  FINDINGS: Stable cardiomegaly without CHF or pneumonia. No focal collapse, consolidation, or edema. No effusion or pneumothorax. Trachea midline. Postop changes from bilateral shoulder arthroplasties and thyroidectomy. No acute osseous finding. Lower thoracic  and lumbar degenerative changes.  IMPRESSION: Cardiomegaly without acute process.  Stable exam.   Electronically Signed   By: Daryll Brod M.D.   On: 07/23/2014 20:09   Ct Head Wo Contrast  07/23/2014   CLINICAL DATA:  Subdural hematoma diagnosed 07/07/2014. Weakness in the left leg today.  EXAM: CT HEAD WITHOUT CONTRAST  TECHNIQUE: Contiguous axial images were obtained from the base of the skull through the vertex without intravenous contrast.  COMPARISON:  07/20/2014 and multiple previous  FINDINGS: Resolving subdural hematoma along the falx anteriorly continues to undergo expected  evolutionary resolution. No evidence of new or worsened bleeding. As seen previously, the largest component is along the right side of the falx. This is much less distinct due to expected evolutionary changes. Minimal mass effect upon the medial aspect of the right frontal lobe persists. Elsewhere, the brain shows chronic appearing small vessel ischemic changes affecting the pons and the cerebral hemispheric white matter. No sign of acute infarction, neoplastic mass lesion or hydrocephalus. No skull fracture. Fluid is present within the air cells of the mastoid tip on the left as seen previously.  IMPRESSION: No new or acute bleeding.  No sign of new infarction.  Expected evolutionary changes of resolving subdural hematoma along the falx.   Electronically Signed   By: Nelson Chimes M.D.   On: 07/23/2014 15:43   Mr Brain Wo Contrast  07/24/2014   CLINICAL DATA:  Persistent dizziness and imbalance following hospitalization 2 weeks ago for subdural hematoma. Increased confusion and disorientation. Sudden onset of left-sided weakness. Question CVA.  EXAM: MRI HEAD WITHOUT CONTRAST  MRA HEAD WITHOUT CONTRAST  TECHNIQUE: Multiplanar, multiecho pulse sequences of the brain and surrounding structures were obtained without intravenous contrast. Angiographic images of the head were obtained using MRA technique without contrast.  COMPARISON:  CT head without contrast 07/23/2014.  FINDINGS: MRI HEAD FINDINGS  The subdural hematoma adjacent to the medial right frontal lobe is again seen. The diffusion-weighted images demonstrate no acute or subacute infarct. No new hemorrhage is present.  A small amount of subarachnoid hemorrhage is suggested over the right frontal convexity adjacent to the subdural collection. There is an arachnoid cyst adjacent to the hemorrhage as well, a chronic finding.  Moderate periventricular and scattered subcortical white matter changes are evident bilaterally.  Flow is present in the major  intracranial arteries. The patient is status post bilateral lens replacements. The globes and orbits are otherwise intact. Artifact is present from the right maxilla. The paranasal sinuses are clear. There is fluid in the mastoid air cells bilaterally. No obstructing nasopharyngeal lesion is evident.  MRA HEAD FINDINGS  Atherosclerotic changes are present in the cavernous internal carotid arteries bilaterally, worse on the right. Signal loss in the anterior genu of the right cavernous internal carotid artery may be artifactually exaggerated by adjacent susceptibility artifact. The A1 and M1 segments are normal. The anterior communicating artery is patent. The MCA bifurcations are intact. ACA and MCA branch vessels are within normal limits.  The left vertebral artery is the dominant vessel. The PICA origins are visualized and normal bilaterally. The basilar artery is within normal limits. Both posterior cerebral arteries originate from the basilar tip. The PCA branch vessels are within normal limits.  IMPRESSION: 1. Persistent medial right subdural hematoma, improved from 2 weeks ago. 2. Probable adjacent subarachnoid blood. 3. No acute hemorrhage. 4. Moderate atrophy and white matter disease bilaterally. 5. Bilateral mastoid effusions. No obstructing nasopharyngeal lesion is evident. 6. Atherosclerotic changes in  the cavernous internal carotid arteries bilaterally. The degree of narrowing is likely exaggerated on the right due to adjacent susceptibility. 7. Otherwise normal MRA circle of Willis without evidence for significant proximal stenosis, aneurysm, or branch vessel occlusion.   Electronically Signed   By: Lawrence Santiago M.D.   On: 07/24/2014 09:03   Mr Lumbar Spine Wo Contrast  07/24/2014   CLINICAL DATA:  Sacral back pain. Intracranial subdural hematoma. Dizziness. Weakness. Urinary incontinence. Low back pain limiting mobility since a recent fall.  EXAM: MRI LUMBAR SPINE WITHOUT CONTRAST  TECHNIQUE:  Multiplanar, multisequence MR imaging of the lumbar spine was performed. No intravenous contrast was administered.  COMPARISON:  07/07/2014; 11/04/2005  FINDINGS: Mildly transitional L5 vertebra. The conus medullaris appears normal. Conus level: L1.  There is 3 mm degenerative retrolisthesis at L1- 2 and L3-4 ; 5 mm degenerative retrolisthesis at L2-3; and 6 mm degenerative anterolisthesis at L4-5. Loss of disc height at all levels in the lumbar spine but most especially at L5-S 1. There is also considerable loss of disc height at T11-12. Type 1 degenerative endplate findings observed at L2-3 and L5-S1. Degenerative facet edema is present at multiple levels but particularly at L4-5.  Despite efforts by the technologist and patient, motion artifact is present on today's exam and could not be eliminated. This reduces exam sensitivity and specificity.  Additional findings at individual levels are as follows:  T10-11: Borderline central narrowing of the thecal sac due to small central disc protrusion. Bilateral facet arthropathy. This level is only included on the parasagittal images.  T11-12:  No impingement.  Disc osteophyte complex noted.  T12-L1:  No impingement.  Mild disc bulge.  L1-2: No overt impingement. Disc bulge and facet arthropathy noted.  L2-3: Mild right and borderline left foraminal stenosis with mild bilateral subarticular lateral recess stenosis and mild displacement of the left L3 nerve in the lateral extraforaminal space due to disc bulge, facet arthropathy, and ligamentum flavum redundancy.  L3-4: Mild bilateral foraminal stenosis with mild right subarticular lateral recess stenosis and mild central narrowing of the thecal sac due to disc bulge, right foraminal disc protrusion, intervertebral spurring, and facet arthropathy.  L4-5: Prominent central narrowing of the thecal sac with moderate right and mild left subarticular lateral recess stenosis and mild bilateral foraminal stenosis due to disc  uncovering, facet arthropathy, and ligamentum flavum redundancy.  L5-S1: Mild left foraminal stenosis with mild bilateral subarticular lateral recess stenosis and displacement of the left L5 nerve in the lateral extraforaminal space due to facet arthropathy, intervertebral spurring, disc bulge, and a left lateral extraforaminal disc protrusion.  No edema centrally in the sacrum.  The coccyx was not included.  IMPRESSION: 1. Lumbar spondylosis and degenerative disc disease, causing prominent impingement at L4-5 and mild impingement at L2-3, L3-4, and L5-S1, as detailed above. 2. Multilevel degenerative subluxations in the lumbar spine.   Electronically Signed   By: Sherryl Barters M.D.   On: 07/24/2014 09:02   Mr Jodene Nam Head/brain Wo Cm  07/24/2014   CLINICAL DATA:  Persistent dizziness and imbalance following hospitalization 2 weeks ago for subdural hematoma. Increased confusion and disorientation. Sudden onset of left-sided weakness. Question CVA.  EXAM: MRI HEAD WITHOUT CONTRAST  MRA HEAD WITHOUT CONTRAST  TECHNIQUE: Multiplanar, multiecho pulse sequences of the brain and surrounding structures were obtained without intravenous contrast. Angiographic images of the head were obtained using MRA technique without contrast.  COMPARISON:  CT head without contrast 07/23/2014.  FINDINGS: MRI HEAD FINDINGS  The  subdural hematoma adjacent to the medial right frontal lobe is again seen. The diffusion-weighted images demonstrate no acute or subacute infarct. No new hemorrhage is present.  A small amount of subarachnoid hemorrhage is suggested over the right frontal convexity adjacent to the subdural collection. There is an arachnoid cyst adjacent to the hemorrhage as well, a chronic finding.  Moderate periventricular and scattered subcortical white matter changes are evident bilaterally.  Flow is present in the major intracranial arteries. The patient is status post bilateral lens replacements. The globes and orbits are  otherwise intact. Artifact is present from the right maxilla. The paranasal sinuses are clear. There is fluid in the mastoid air cells bilaterally. No obstructing nasopharyngeal lesion is evident.  MRA HEAD FINDINGS  Atherosclerotic changes are present in the cavernous internal carotid arteries bilaterally, worse on the right. Signal loss in the anterior genu of the right cavernous internal carotid artery may be artifactually exaggerated by adjacent susceptibility artifact. The A1 and M1 segments are normal. The anterior communicating artery is patent. The MCA bifurcations are intact. ACA and MCA branch vessels are within normal limits.  The left vertebral artery is the dominant vessel. The PICA origins are visualized and normal bilaterally. The basilar artery is within normal limits. Both posterior cerebral arteries originate from the basilar tip. The PCA branch vessels are within normal limits.  IMPRESSION: 1. Persistent medial right subdural hematoma, improved from 2 weeks ago. 2. Probable adjacent subarachnoid blood. 3. No acute hemorrhage. 4. Moderate atrophy and white matter disease bilaterally. 5. Bilateral mastoid effusions. No obstructing nasopharyngeal lesion is evident. 6. Atherosclerotic changes in the cavernous internal carotid arteries bilaterally. The degree of narrowing is likely exaggerated on the right due to adjacent susceptibility. 7. Otherwise normal MRA circle of Willis without evidence for significant proximal stenosis, aneurysm, or branch vessel occlusion.   Electronically Signed   By: Lawrence Santiago M.D.   On: 07/24/2014 09:03   2D Echocardiogram  EF 60-65% with no source of embolus.   PHYSICAL EXAM Pleasant elderly Caucasian lady not in distress.Awake alert. Afebrile. Head is nontraumatic. Neck is supple without bruit. Hearing is normal. Cardiac exam no murmur or gallop. Lungs are clear to auscultation. Distal pulses are well felt. Neurological Exam ;  Awake  Alert oriented x 2.  Mildly diminished recall and attention.. Normal speech and language.eye movements full without nystagmus.fundi were not visualized. Vision acuity and fields appear normal. Hearing is normal. Palatal movements are normal. Face symmetric. Tongue midline. Normal strength, tone, reflexes and coordination. Normal sensation. Gait deferred. ASSESSMENT/PLAN Ms. TAYVIA Cobb is a 80 y.o. female with history of fall with resultant headache presenting with sudden onset inability to move her left leg. She did not receive IV t-PA due to recent SDH and symptoms resolved.    Symptomatic traumatic SAH. No acute stroke  Resultant   ? . L left weakness resolved.  MRI  medial right subdural hematoma, improved from 2 weeks ago. Adjacent subarachnoid blood. Bilateral mastoid effusions.   MRA  No significant stenosis. Atherosclerotic B ICAs   Carotid Doppler  Will cancel. No acute stroke  2D Echo  No source of embolus   MRI Lumbar Spine  Lumbar spondylosis & disc disease, worse at L4-5 and mild at L2-3, L3-4, and L5-S1.   SCDs ordered, not on, for VTE prophylaxis  Diet heart healthy/carb modified thin liquids  no antithrombotics prior to admission  Ongoing aggressive stroke risk factor management  Therapy recommendations:  OP PT  Disposition:  Home with OP therapies  NO FURTHER STROKE WORKUP INDICATED Stroke Service will sign off. Please call should any needs arise. No stroke followup necessary.   Hypertension  BP 94-180/57-66 past 24h (07/24/2014 @ 2:15 PM)   Stable  Hyperlipidemia  Home meds:  crestor 20, resumed in hospital  LDL 61, goal < 70  Continue statin at discharge  Diabetes  HgbA1c pending  goal < 7.0  Other Stroke Risk Factors  Advanced age  Obesity, Body mass index is 32.08 kg/(m^2).   Family hx stroke (brothers)  Mild AS  Other Active Problems  New onset anxiety attacks   Other Pertinent History  Dilatation of esophogeal stricture  Unilateral vocal cord  paralysis  Mucus pooling in larynx  IBS  Hepatitis  Hospital day # [place] for Pager information 07/24/2014 2:18 PM I have personally examined this patient, reviewed notes, independently viewed imaging studies, participated in medical decision making and plan of care. I have made any additions or clarifications directly to the above note. Agree with note above. Patient with recent traumatic subdural hematoma with questionable transient left leg weakness but her history is variable with patient complaining of dizziness and generalized weakness. Neurovascular workup does not show definite stroke. Probable right brain TIA is definite history of isolated left leg weakness is confirmed.  Antony Contras, MD Medical Director [facility] Pager: [phone] 07/24/2014 7:13 PM     To contact Stroke Continuity provider, please refer to [email or web address] After hours, contact General Neurology

## 2014-07-24 NOTE — Evaluation (Signed)
Clinical/Bedside Swallow Evaluation Patient Details  Name: Hailey Cobb MRN: [id number] Date of Birth: 12-15-1931  Today's Date: 07/24/2014 Time: 6962-9528 SLP Time Calculation (min) (ACUTE ONLY): 13 min  Past Medical History:  Past Medical History  Diagnosis Date  . Hypertension   . Diabetes mellitus without complication     Type 2   . Osteoarthritis   . S/P dilatation of esophageal stricture   . Unilateral vocal cord paralysis   . Mucus pooling in larynx     Begin after having thyroidectomy; pt takes Mucus medication nightly  . Shortness of breath     with exertion  . Anxiety   . Hypothyroidism   . GERD (gastroesophageal reflux disease)   . IBS (irritable bowel syndrome)   . Hypercholesteremia   . Vitiligo   . Heart murmur     mild AS by 01/2010 echo [facility])  . Hepatitis     Autoimmune    Past Surgical History:  Past Surgical History  Procedure Laterality Date  . Thyroidectomy  11-2008  . Joint replacement Left     Knee  . Joint replacement Left     Shoulder  . Vocal laryngoplasty      s/p left vocal fold medialization laryngoplasty with Goretex 02/07/10 (Dr. Wonda Amis)  . Elbow bursa surgery Left   . Abdominal hysterectomy    . Appendectomy    . Total shoulder arthroplasty Right 05/16/2014    Procedure: TOTAL SHOULDER ARTHROPLASTY;  Surgeon: Ninetta Lights, MD;  Location: [place];  Service: Orthopedics;  Laterality: Right;   HPI:  80 year old female with history of hypertension, type 2 diabetes mellitus, anxiety, esophageal dysphagia, hypothyroidism, who was recently hospitalized 2 weeks back with a subdural hematoma and discharged after monitoring. Patient and her daughter report that she has not been steady since the discharge and has been swaying to her sides during ambulation. Daughter also reports that she has not been very alert and oriented since then.  Head CT showed no new or acute bleeding or signs of infarct. Also showed resolving subdural hematoma    Assessment / Plan / Recommendation Clinical Impression  Pt presents with normal oropharyngeal swallow with adequate mastication, swift/consistent swallow response, and no s/s of aspiration.  Pt confirms vocal cord surgery x five years ago, with resulting dysphagia that has since resolved.   Recommend resuming a regular diet with thin liquids; pt prefers to consume meds in puree.  No f/u warranted.  No formalized speech/language eval warranted.  Will sign off.     Aspiration Risk  Mild    Diet Recommendation Regular;Thin liquid   Liquid Administration via: Cup;Straw Medication Administration: Whole meds with puree Supervision: Patient able to self feed    Other  Recommendations Oral Care Recommendations: Oral care BID   Follow Up Recommendations  None           SLP Swallow Goals     Swallow Study Prior Functional Status       General Date of Onset: 07/23/14 HPI: 80 year old female with history of hypertension, type 2 diabetes mellitus, anxiety, esophageal dysphagia, hypothyroidism, who was recently hospitalized 2 weeks back with a subdural hematoma and discharged after monitoring. Patient and her daughter report that she has not been steady since the discharge and has been swaying to her sides during ambulation. Daughter also reports that she has not been very alert and oriented since then.  Head CT showed no new or acute bleeding or signs of infarct. Also showed resolving subdural  hematoma Type of Study: Bedside swallow evaluation Previous Swallow Assessment: none per records Diet Prior to this Study: NPO Temperature Spikes Noted: No Respiratory Status: Room air History of Recent Intubation: No Behavior/Cognition: Alert;Cooperative;Pleasant mood Oral Cavity - Dentition: Adequate natural dentition Self-Feeding Abilities: Able to feed self Patient Positioning: Upright in bed Baseline Vocal Quality: Clear Volitional Cough: Strong Volitional Swallow: Able to elicit     Oral/Motor/Sensory Function Overall Oral Motor/Sensory Function: Appears within functional limits for tasks assessed   Ice Chips Ice chips: Not tested   Thin Liquid Thin Liquid: Within functional limits Presentation: Cup;Straw    Nectar Thick Nectar Thick Liquid: Not tested   Honey Thick Honey Thick Liquid: Not tested   Puree Puree: Within functional limits   Solid Briza Bark L. Pinch, [place] CCC/SLP Pager [phone]     Solid: Within functional limits Presentation: Self Fed       Juan Quam Laurice 07/24/2014,1:10 PM

## 2014-07-24 NOTE — Progress Notes (Signed)
VASCULAR LAB PRELIMINARY  PRELIMINARY  PRELIMINARY  PRELIMINARY  Carotid duplex  completed.    Preliminary report:  Bilateral:  1-39% ICA stenosis.  Vertebral artery flow is antegrade.        Kymberley Raz, RVT 07/24/2014, 2:57 PM

## 2014-07-24 NOTE — Progress Notes (Signed)
[facility] PROGRESS NOTE  Hailey Cobb OEV:[id number] DOB: [birth year] DOA: 07/23/2014  PCP: Gennette Pac, MD  Brief HPI: 80 year old female with multiple medical problems who was hospitalized recently for subdural hematoma. She comes in with complains of left lower extremity weakness. She was admitted for further workup.  Past medical history:  Past Medical History  Diagnosis Date  . Hypertension   . Diabetes mellitus without complication     Type 2   . Osteoarthritis   . S/P dilatation of esophageal stricture   . Unilateral vocal cord paralysis   . Mucus pooling in larynx     Begin after having thyroidectomy; pt takes Mucus medication nightly  . Shortness of breath     with exertion  . Anxiety   . Hypothyroidism   . GERD (gastroesophageal reflux disease)   . IBS (irritable bowel syndrome)   . Hypercholesteremia   . Vitiligo   . Heart murmur     mild AS by 01/2010 echo [facility])  . Hepatitis     Autoimmune     Consultants: Neurology  Procedures:  2-D echo. Pending.  Carotid Doppler. Pending  Antibiotics: None  Subjective: Patient states that the weakness in the left leg is improved. She is requesting that the staples in her scalp be removed. Denies any other complaints.  Objective: Vital Signs  Filed Vitals:   07/24/14 0011 07/24/14 0200 07/24/14 0358 07/24/14 0554  BP: 94/66 141/44 157/59 146/50  Pulse: 58 54 59 61  Temp: 97.9 F (36.6 C) 97.9 F (36.6 C) 98.4 F (36.9 C) 98.4 F (36.9 C)  TempSrc: Oral Oral Oral Oral  Resp: 18 18 18 18   Height:      Weight:      SpO2: 100% 99% 99% 92%    Intake/Output Summary (Last 24 hours) at 07/24/14 1134 Last data filed at 07/23/14 1549  Gross per 24 hour  Intake      0 ml  Output    200 ml  Net   -200 ml   Filed Weights   07/23/14 1333  Weight: 84.823 kg (187 lb)    General appearance: alert, cooperative, appears stated age, no distress and Staples noted in the right posterior scalp  area. No erythema, drainage. Laceration appears to be healing. Resp: clear to auscultation bilaterally Cardio: S1, S2 is normal, regular. Systolic murmur appreciated over the precordium. No S3, S4. No rubs or bruits. No pedal edema. GI: soft, non-tender; bowel sounds normal; no masses,  no organomegaly Extremities: extremities normal, atraumatic, no cyanosis or edema Neurologic: Alert and oriented 3. No facial droop. Tongue is midline. Motor strength is equal bilateral upper extremities. Left leg was noted to be weaker compared to the right. About 4 out of 5. Gait was not assessed.  Lab Results:  Basic Metabolic Panel:  Recent Labs Lab 07/23/14 1432  NA 137  K 4.0  CL 98  CO2 22  GLUCOSE 132*  BUN 10  CREATININE 0.50  CALCIUM 9.9   Liver Function Tests:  Recent Labs Lab 07/23/14 1432  AST 24  ALT 16  ALKPHOS 82  BILITOT 0.3  PROT 7.6  ALBUMIN 3.6   CBC:  Recent Labs Lab 07/23/14 1432 07/24/14 0450  WBC 4.8 4.1  NEUTROABS 3.3  --   HGB 12.9 11.1*  HCT 39.4 34.7*  MCV 87.2 87.6  PLT 148* 160   Cardiac Enzymes:  Recent Labs Lab 07/23/14 1432  TROPONINI <0.30   CBG:  Recent Labs Lab 07/23/14  2128 07/24/14 0912  GLUCAP 116* 175*    Recent Results (from the past 240 hour(s))  Culture, blood (routine x 2)     Status: None (Preliminary result)   Collection Time: 07/23/14  2:48 PM  Result Value Ref Range Status   Specimen Description BLOOD ARM LEFT  Final   Special Requests BOTTLES DRAWN AEROBIC AND ANAEROBIC 5CC  Final   Culture  Setup Time   Final    07/23/2014 21:52 Performed at [facility]    Culture   Final           BLOOD CULTURE RECEIVED NO GROWTH TO DATE CULTURE WILL BE HELD FOR 5 DAYS BEFORE ISSUING A FINAL NEGATIVE REPORT Performed at [facility]    Report Status PENDING  Incomplete  Culture, blood (routine x 2)     Status: None (Preliminary result)   Collection Time: 07/23/14  2:58 PM  Result Value Ref Range Status     Specimen Description BLOOD LEFT HAND  Final   Special Requests BOTTLES DRAWN AEROBIC AND ANAEROBIC 5CC  Final   Culture  Setup Time   Final    07/23/2014 21:54 Performed at [facility]    Culture   Final           BLOOD CULTURE RECEIVED NO GROWTH TO DATE CULTURE WILL BE HELD FOR 5 DAYS BEFORE ISSUING A FINAL NEGATIVE REPORT Performed at [facility]    Report Status PENDING  Incomplete      Studies/Results: Dg Chest 2 View  07/23/2014   CLINICAL DATA:  Acute stroke symptoms, hypertension and diabetes, acute intracranial hemorrhage  EXAM: CHEST - 2 VIEW  COMPARISON:  05/07/2014  FINDINGS: Stable cardiomegaly without CHF or pneumonia. No focal collapse, consolidation, or edema. No effusion or pneumothorax. Trachea midline. Postop changes from bilateral shoulder arthroplasties and thyroidectomy. No acute osseous finding. Lower thoracic and lumbar degenerative changes.  IMPRESSION: Cardiomegaly without acute process.  Stable exam.   Electronically Signed   By: Daryll Brod M.D.   On: 07/23/2014 20:09   Ct Head Wo Contrast  07/23/2014   CLINICAL DATA:  Subdural hematoma diagnosed 07/07/2014. Weakness in the left leg today.  EXAM: CT HEAD WITHOUT CONTRAST  TECHNIQUE: Contiguous axial images were obtained from the base of the skull through the vertex without intravenous contrast.  COMPARISON:  07/20/2014 and multiple previous  FINDINGS: Resolving subdural hematoma along the falx anteriorly continues to undergo expected evolutionary resolution. No evidence of new or worsened bleeding. As seen previously, the largest component is along the right side of the falx. This is much less distinct due to expected evolutionary changes. Minimal mass effect upon the medial aspect of the right frontal lobe persists. Elsewhere, the brain shows chronic appearing small vessel ischemic changes affecting the pons and the cerebral hemispheric white matter. No sign of acute infarction, neoplastic mass  lesion or hydrocephalus. No skull fracture. Fluid is present within the air cells of the mastoid tip on the left as seen previously.  IMPRESSION: No new or acute bleeding.  No sign of new infarction.  Expected evolutionary changes of resolving subdural hematoma along the falx.   Electronically Signed   By: Nelson Chimes M.D.   On: 07/23/2014 15:43   Mr Brain Wo Contrast  07/24/2014   CLINICAL DATA:  Persistent dizziness and imbalance following hospitalization 2 weeks ago for subdural hematoma. Increased confusion and disorientation. Sudden onset of left-sided weakness. Question CVA.  EXAM: MRI HEAD WITHOUT CONTRAST  MRA HEAD WITHOUT CONTRAST  TECHNIQUE: Multiplanar, multiecho pulse sequences of the brain and surrounding structures were obtained without intravenous contrast. Angiographic images of the head were obtained using MRA technique without contrast.  COMPARISON:  CT head without contrast 07/23/2014.  FINDINGS: MRI HEAD FINDINGS  The subdural hematoma adjacent to the medial right frontal lobe is again seen. The diffusion-weighted images demonstrate no acute or subacute infarct. No new hemorrhage is present.  A small amount of subarachnoid hemorrhage is suggested over the right frontal convexity adjacent to the subdural collection. There is an arachnoid cyst adjacent to the hemorrhage as well, a chronic finding.  Moderate periventricular and scattered subcortical white matter changes are evident bilaterally.  Flow is present in the major intracranial arteries. The patient is status post bilateral lens replacements. The globes and orbits are otherwise intact. Artifact is present from the right maxilla. The paranasal sinuses are clear. There is fluid in the mastoid air cells bilaterally. No obstructing nasopharyngeal lesion is evident.  MRA HEAD FINDINGS  Atherosclerotic changes are present in the cavernous internal carotid arteries bilaterally, worse on the right. Signal loss in the anterior genu of the right  cavernous internal carotid artery may be artifactually exaggerated by adjacent susceptibility artifact. The A1 and M1 segments are normal. The anterior communicating artery is patent. The MCA bifurcations are intact. ACA and MCA branch vessels are within normal limits.  The left vertebral artery is the dominant vessel. The PICA origins are visualized and normal bilaterally. The basilar artery is within normal limits. Both posterior cerebral arteries originate from the basilar tip. The PCA branch vessels are within normal limits.  IMPRESSION: 1. Persistent medial right subdural hematoma, improved from 2 weeks ago. 2. Probable adjacent subarachnoid blood. 3. No acute hemorrhage. 4. Moderate atrophy and white matter disease bilaterally. 5. Bilateral mastoid effusions. No obstructing nasopharyngeal lesion is evident. 6. Atherosclerotic changes in the cavernous internal carotid arteries bilaterally. The degree of narrowing is likely exaggerated on the right due to adjacent susceptibility. 7. Otherwise normal MRA circle of Willis without evidence for significant proximal stenosis, aneurysm, or branch vessel occlusion.   Electronically Signed   By: Lawrence Santiago M.D.   On: 07/24/2014 09:03   Mr Lumbar Spine Wo Contrast  07/24/2014   CLINICAL DATA:  Sacral back pain. Intracranial subdural hematoma. Dizziness. Weakness. Urinary incontinence. Low back pain limiting mobility since a recent fall.  EXAM: MRI LUMBAR SPINE WITHOUT CONTRAST  TECHNIQUE: Multiplanar, multisequence MR imaging of the lumbar spine was performed. No intravenous contrast was administered.  COMPARISON:  07/07/2014; 11/04/2005  FINDINGS: Mildly transitional L5 vertebra. The conus medullaris appears normal. Conus level: L1.  There is 3 mm degenerative retrolisthesis at L1- 2 and L3-4 ; 5 mm degenerative retrolisthesis at L2-3; and 6 mm degenerative anterolisthesis at L4-5. Loss of disc height at all levels in the lumbar spine but most especially at L5-S  1. There is also considerable loss of disc height at T11-12. Type 1 degenerative endplate findings observed at L2-3 and L5-S1. Degenerative facet edema is present at multiple levels but particularly at L4-5.  Despite efforts by the technologist and patient, motion artifact is present on today's exam and could not be eliminated. This reduces exam sensitivity and specificity.  Additional findings at individual levels are as follows:  T10-11: Borderline central narrowing of the thecal sac due to small central disc protrusion. Bilateral facet arthropathy. This level is only included on the parasagittal images.  T11-12:  No impingement.  Disc osteophyte  complex noted.  T12-L1:  No impingement.  Mild disc bulge.  L1-2: No overt impingement. Disc bulge and facet arthropathy noted.  L2-3: Mild right and borderline left foraminal stenosis with mild bilateral subarticular lateral recess stenosis and mild displacement of the left L3 nerve in the lateral extraforaminal space due to disc bulge, facet arthropathy, and ligamentum flavum redundancy.  L3-4: Mild bilateral foraminal stenosis with mild right subarticular lateral recess stenosis and mild central narrowing of the thecal sac due to disc bulge, right foraminal disc protrusion, intervertebral spurring, and facet arthropathy.  L4-5: Prominent central narrowing of the thecal sac with moderate right and mild left subarticular lateral recess stenosis and mild bilateral foraminal stenosis due to disc uncovering, facet arthropathy, and ligamentum flavum redundancy.  L5-S1: Mild left foraminal stenosis with mild bilateral subarticular lateral recess stenosis and displacement of the left L5 nerve in the lateral extraforaminal space due to facet arthropathy, intervertebral spurring, disc bulge, and a left lateral extraforaminal disc protrusion.  No edema centrally in the sacrum.  The coccyx was not included.  IMPRESSION: 1. Lumbar spondylosis and degenerative disc disease, causing  prominent impingement at L4-5 and mild impingement at L2-3, L3-4, and L5-S1, as detailed above. 2. Multilevel degenerative subluxations in the lumbar spine.   Electronically Signed   By: Sherryl Barters M.D.   On: 07/24/2014 09:02   Mr Jodene Nam Head/brain Wo Cm  07/24/2014   CLINICAL DATA:  Persistent dizziness and imbalance following hospitalization 2 weeks ago for subdural hematoma. Increased confusion and disorientation. Sudden onset of left-sided weakness. Question CVA.  EXAM: MRI HEAD WITHOUT CONTRAST  MRA HEAD WITHOUT CONTRAST  TECHNIQUE: Multiplanar, multiecho pulse sequences of the brain and surrounding structures were obtained without intravenous contrast. Angiographic images of the head were obtained using MRA technique without contrast.  COMPARISON:  CT head without contrast 07/23/2014.  FINDINGS: MRI HEAD FINDINGS  The subdural hematoma adjacent to the medial right frontal lobe is again seen. The diffusion-weighted images demonstrate no acute or subacute infarct. No new hemorrhage is present.  A small amount of subarachnoid hemorrhage is suggested over the right frontal convexity adjacent to the subdural collection. There is an arachnoid cyst adjacent to the hemorrhage as well, a chronic finding.  Moderate periventricular and scattered subcortical white matter changes are evident bilaterally.  Flow is present in the major intracranial arteries. The patient is status post bilateral lens replacements. The globes and orbits are otherwise intact. Artifact is present from the right maxilla. The paranasal sinuses are clear. There is fluid in the mastoid air cells bilaterally. No obstructing nasopharyngeal lesion is evident.  MRA HEAD FINDINGS  Atherosclerotic changes are present in the cavernous internal carotid arteries bilaterally, worse on the right. Signal loss in the anterior genu of the right cavernous internal carotid artery may be artifactually exaggerated by adjacent susceptibility artifact. The A1 and  M1 segments are normal. The anterior communicating artery is patent. The MCA bifurcations are intact. ACA and MCA branch vessels are within normal limits.  The left vertebral artery is the dominant vessel. The PICA origins are visualized and normal bilaterally. The basilar artery is within normal limits. Both posterior cerebral arteries originate from the basilar tip. The PCA branch vessels are within normal limits.  IMPRESSION: 1. Persistent medial right subdural hematoma, improved from 2 weeks ago. 2. Probable adjacent subarachnoid blood. 3. No acute hemorrhage. 4. Moderate atrophy and white matter disease bilaterally. 5. Bilateral mastoid effusions. No obstructing nasopharyngeal lesion is evident. 6. Atherosclerotic changes in  the cavernous internal carotid arteries bilaterally. The degree of narrowing is likely exaggerated on the right due to adjacent susceptibility. 7. Otherwise normal MRA circle of Willis without evidence for significant proximal stenosis, aneurysm, or branch vessel occlusion.   Electronically Signed   By: Lawrence Santiago M.D.   On: 07/24/2014 09:03    Medications:  Scheduled: . azelastine  2 spray Each Nare Daily  . furosemide  20 mg Oral Daily  . insulin aspart  0-15 Units Subcutaneous TID WC  . insulin aspart  0-5 Units Subcutaneous QHS  . irbesartan  150 mg Oral BID  . levothyroxine  125 mcg Oral QAC breakfast  . linagliptin  5 mg Oral Daily  . metoprolol succinate  50 mg Oral BID  . pantoprazole  40 mg Oral Daily  . rosuvastatin  20 mg Oral Daily  . sodium chloride  3 mL Intravenous Q12H   Continuous: . sodium chloride     JJH:ERDEYCXKGYJEH **OR** acetaminophen, bisacodyl, HYDROcodone-acetaminophen, LORazepam, metoprolol, morphine injection, ondansetron **OR** ondansetron (ZOFRAN) IV, ondansetron, senna, senna-docusate  Assessment/Plan:  Active Problems:   Subdural hematoma   CVA (cerebral infarction)   CVA (cerebral vascular accident)   Diabetes mellitus    Anxiety   Hypothyroidism   Essential hypertension   Acute left-sided weakness    Left lower extremity weakness  Stroke workup is in progress. Neurology is following. She is on no antiplatelet agents or anticoagulants because of recent subdural hematoma. PT, OT to see. She has a history of dysphagia so, swallow evaluation is pending. She is currently nothing by mouth. EEG is pending. LDL is 61.  Recent Subdural hematoma She was hospitalized for the same. She was seen by Dr. Arnoldo Morale. CT shows improvement in the hematoma. Continue to monitor for now. The staples are up for a scalp laceration. These can be removed. She can follow with Dr. Arnoldo Morale as an outpatient. No need to consult him currently.   Questionable UTI She had symptoms of urinary incontinence. UA is only mildly abnormal. She has been empirically placed on antibiotics. Follow up on urine cultures.   Persistent low back pain MRI of the lumbar spine as above. Spondylosis and degenerative disc disease with the mild to moderate impingement is noted. However, nothing that correlates with her current symptoms of left leg weakness. Physical therapy and occupational therapy to evaluate.  Type 2 diabetes mellitus Holding metformin. Continue current meds. Follow CBGs. Follow HbA1c.  Anxiety Continue anxiolytics.  Hypothyroidism Continue Synthroid  History of Essential hypertension On multiple blood pressure medications at home. Allow permissive blood pressure. Holding certain medications, continuing others.    DVT Prophylaxis: SCDs.     Code Status: Full code  Family Communication: Discussed with the patient  Disposition Plan: Not ready for discharge.    LOS: 1 day   [facility] Pager [phone] 07/24/2014, 11:34 AM  If 8PM-8AM, please contact night-coverage at [email or web address], password [facility]

## 2014-07-24 NOTE — Progress Notes (Signed)
EEG Completed; Results Pending  

## 2014-07-24 NOTE — Progress Notes (Signed)
CARE MANAGEMENT NOTE 07/24/2014  Patient:  Hailey Cobb, Hailey Cobb   Account Number:  [id number]  Date Initiated:  07/24/2014  Documentation initiated by:  Olga Coaster  Subjective/Objective Assessment:   ADMITTED WITH UTI, RECENT SDH     Action/Plan:   CM FOLLOWING FOR DCP   Anticipated DC Date:  07/31/2014   Anticipated DC Plan:  [place]  CM consult              Status of service:  In process, will continue to follow Medicare Important Message given?   (If response is "NO", the following Medicare IM given date fields will be blank)  Per UR Regulation:  Reviewed for med. necessity/level of care/duration of stay  Comments:  12/8/2015Mindi Slicker RN,BSN,MHA [phone]

## 2014-07-24 NOTE — Evaluation (Signed)
Physical Therapy Evaluation and Discharge Patient Details Name: Hailey Cobb MRN: [id number] DOB: 11-30-1931 Today's Date: 07/24/2014   History of Present Illness  80 yo adm 07/23/14 with LLE weakness. CT head and MRI negative for acute changes (+ resolving recent Rt frontal SDH) PMHx- 07/07/14 Rt frontal SDH when mantel of fireplace fell on her and knocked her down; Lt TKA; Rt TSA ; HTN, DM, OA    Clinical Impression  Patient evaluated by Physical Therapy with no further acute PT needs identified. All education has been completed and the patient has no further questions. Pt has very slight drift to her right with more complex dynamic balance activities and is familiar with use of RW. (We discussed not using her cane, unless she can use it in her Lt hand--Rt hand will incr weight shift to her right and exacerbate Rt drift). Pt reports her LLE strength is nearly back to baseline. See below for any follow-up Physial Therapy or equipment needs. PT is signing off. Thank you for this referral.     Follow Up Recommendations Outpatient PT (to resume Rt shoulder therapy);Supervision - Intermittent    Equipment Recommendations  None recommended by PT    Recommendations for Other Services       Precautions / Restrictions Precautions Precautions: Fall      Mobility  Bed Mobility               General bed mobility comments: sitting EOB on arrival  Transfers Overall transfer level: Modified independent Equipment used: None             General transfer comment: safe technique with no LOB  Ambulation/Gait Ambulation/Gait assistance: Min guard Ambulation Distance (Feet): 180 Feet Assistive device: None Gait Pattern/deviations: Step-through pattern;Drifts right/left     General Gait Details: pt reports feeling she is being pulled or leaning to the right; undetectable with pt walking on level surface with focus forward; +drift to her right with head turns  Stairs            Wheelchair Mobility    Modified Rankin (Stroke Patients Only)       Balance Overall balance assessment: Modified Independent                   Tandem Stance - Right Leg: 30 Tandem Stance - Left Leg: 30 Rhomberg - Eyes Opened: 60 Rhomberg - Eyes Closed: 30 High level balance activites: Direction changes;Turns;Sudden stops High Level Balance Comments: no loss of balance or drifting Standardized Balance Assessment Standardized Balance Assessment : Berg Balance Test Berg Balance Test Sit to Stand: Able to stand  independently using hands Standing Unsupported: Able to stand safely 2 minutes Sitting with Back Unsupported but Feet Supported on Floor or Stool: Able to sit safely and securely 2 minutes Stand to Sit: Sits safely with minimal use of hands Transfers: Able to transfer safely, minor use of hands Standing Unsupported with Eyes Closed: Able to stand 10 seconds safely Standing Ubsupported with Feet Together: Able to place feet together independently and stand 1 minute safely From Standing, Reach Forward with Outstretched Arm: Can reach forward >12 cm safely (5") From Standing Position, Turn to Look Behind Over each Shoulder: Looks behind from both sides and weight shifts well Turn 360 Degrees: Able to turn 360 degrees safely but slowly Standing Unsupported, One Foot in Front: Able to plae foot ahead of the other independently and hold 30 seconds         Pertinent Vitals/Pain Pain  Assessment: No/denies pain    Home Living Family/patient expects to be discharged to:: Private residence Living Arrangements: Spouse/significant other Available Help at Discharge: Family;Available 24 hours/day Type of Home: House Home Access: Stairs to enter Entrance Stairs-Rails: Right;Left;Can reach both Entrance Stairs-Number of Steps: 3 Home Layout: One level Home Equipment: Grab bars - tub/shower;Cane - single point;Walker - 2 wheels      Prior Function Level of Independence:  Independent with assistive device(s)         Comments: using cane vs RW when feels unsteady (off/on since 11/21)     Hand Dominance   Dominant Hand: Right    Extremity/Trunk Assessment   Upper Extremity Assessment: Overall WFL for tasks assessed (pt with limited shoulder ROM)           Lower Extremity Assessment: LLE deficits/detail   LLE Deficits / Details: AROM WFL; DF 5/5, knee extension 5/5  Cervical / Trunk Assessment: Kyphotic  Communication   Communication: No difficulties  Cognition Arousal/Alertness: Awake/alert Behavior During Therapy: WFL for tasks assessed/performed Overall Cognitive Status: Within Functional Limits for tasks assessed                      General Comments General comments (skin integrity, edema, etc.): Pt reports strength has nearly returned to normal    Exercises        Assessment/Plan    PT Assessment All further PT needs can be met in the next venue of care  PT Diagnosis Difficulty walking   PT Problem List Decreased balance  PT Treatment Interventions     PT Goals (Current goals can be found in the Care Plan section) Acute Rehab PT Goals Patient Stated Goal: regain use of her RUE (s/p TSA) PT Goal Formulation: All assessment and education complete, DC therapy    Frequency     Barriers to discharge        Co-evaluation               End of Session Equipment Utilized During Treatment: Gait belt Activity Tolerance: Patient tolerated treatment well Patient left: in chair;with call bell/phone within reach;with chair alarm set;with family/visitor present           Time: 6063-0160 PT Time Calculation (min) (ACUTE ONLY): 25 min   Charges:   PT Evaluation $Initial PT Evaluation Tier I: 1 Procedure PT Treatments $Gait Training: 8-22 mins   PT G Codes:          Rhiley Tarver 07/25/2014, 2:10 PM Pager [phone]

## 2014-07-24 NOTE — Procedures (Signed)
EEG report.  Brief clinical history:  80 year old female with multiple medical problems who was hospitalized recently for subdural hematoma. She comes in with complains of left lower extremity weakness. She was admitted for further workup.  Technique: this is a 17 channel routine scalp EEG performed at the bedside with bipolar and monopolar montages arranged in accordance to the international 10/20 system of electrode placement. One channel was dedicated to EKG recording.  The study was performed during wakefulness, drowsiness, and stage 2 sleep. No activating procedures performed.  Description:In the wakeful state, the best background consisted of a medium amplitude, posterior dominant, well sustained, symmetric and reactive 10 Hz rhythm. Drowsiness demonstrated dropout of the alpha rhythm. Stage 2 sleep showed symmetric and synchronous sleep spindles without intermixed epileptiform discharges. No focal or generalized epileptiform discharges noted.  No pathologic areas of slowing seen.  EKG showed sinus rhythm.  Impression: this is a normal awake and asleep EEG. Please, be aware that a normal EEG does not exclude the possibility of epilepsy.  Clinical correlation is advised.   Dorian Pod, MD

## 2014-07-24 NOTE — Progress Notes (Signed)
Echocardiogram 2D Echocardiogram has been performed.  Joelene Millin 07/24/2014, 9:05 AM

## 2014-07-24 NOTE — Progress Notes (Signed)
OT Cancellation Note  Patient Details Name: Hailey Cobb MRN: [id number] DOB: Nov 12, 1931   Cancelled Treatment:    Reason Eval/Treat Not Completed: Patient at procedure or test/ unavailable (MRI). Will continue to follow.  [name] 07/24/2014, 8:22 AM

## 2014-07-25 LAB — CBC
HCT: 32.7 % — ABNORMAL LOW (ref 36.0–46.0)
Hemoglobin: 10.6 g/dL — ABNORMAL LOW (ref 12.0–15.0)
MCH: 28.4 pg (ref 26.0–34.0)
MCHC: 32.4 g/dL (ref 30.0–36.0)
MCV: 87.7 fL (ref 78.0–100.0)
Platelets: 160 10*3/uL (ref 150–400)
RBC: 3.73 MIL/uL — ABNORMAL LOW (ref 3.87–5.11)
RDW: 14.3 % (ref 11.5–15.5)
WBC: 4.8 10*3/uL (ref 4.0–10.5)

## 2014-07-25 LAB — BASIC METABOLIC PANEL
Anion gap: 12 (ref 5–15)
BUN: 16 mg/dL (ref 6–23)
CO2: 24 mEq/L (ref 19–32)
Calcium: 9.5 mg/dL (ref 8.4–10.5)
Chloride: 101 mEq/L (ref 96–112)
Creatinine, Ser: 0.57 mg/dL (ref 0.50–1.10)
GFR calc Af Amer: >90 mL/min (ref >90)
GFR calc non Af Amer: 85 mL/min — ABNORMAL LOW (ref >90)
Glucose, Bld: 175 mg/dL — ABNORMAL HIGH (ref 70–99)
Potassium: 3.7 mEq/L (ref 3.7–5.3)
Sodium: 137 mEq/L (ref 137–147)

## 2014-07-25 LAB — GLUCOSE, CAPILLARY
Glucose-Capillary: 206 mg/dL — ABNORMAL HIGH (ref 70–99)
Glucose-Capillary: 165 mg/dL — ABNORMAL HIGH (ref 70–99)

## 2014-07-25 MED ORDER — AMLODIPINE BESYLATE 10 MG PO TABS
10.0000 mg | ORAL_TABLET | Freq: Every day | ORAL | Status: DC
  Administered 2014-07-25: 10 mg via ORAL
  Filled 2014-07-25: qty 1

## 2014-07-25 NOTE — Progress Notes (Signed)
Discharge instructions given to pt. Pt verbally acknowledged understanding. Pt voiced no questions when prompted. Family at side with pt's belongings. Pt to be transported home per private car by family member. Pt to be transported to lobby per wheelchair by nurse tech, Jonelle Sidle. Will monitor.   Angeline Slim I 07/25/2014 4:47 PM

## 2014-07-25 NOTE — Progress Notes (Signed)
Pt experienced a brief episode of inability to move left leg during an attempt to get up to the bathroom with nurse aide.  Nurse aide alerted nurse.  Upon assessment patients symptoms had resolved.  A neuro check was completed and pt was neurologically intact.  Pt advised to alert RN if she experiences these symptoms again.  Will continue to monitor.

## 2014-07-25 NOTE — Discharge Summary (Signed)
Physician Discharge Summary  Hailey Cobb QZR:[id number] DOB: 06/28/1932 DOA: 07/23/2014  PCP: Gennette Pac, MD  Admit date: 07/23/2014 Discharge date: 07/25/2014  Time spent: 30 minutes  Recommendations for Outpatient Follow-up:  1. Follow up with PCP in 1-2 weeks 2.  Follow up with Dr. Arnoldo Morale as scheduled on 12/16 as scheduled  Discharge Diagnoses:  Active Problems:   Subdural hematoma   CVA (cerebral infarction)   CVA (cerebral vascular accident)   Diabetes mellitus   Anxiety   Hypothyroidism   Essential hypertension   Acute left-sided weakness  Discharge Condition: Stable  Diet recommendation: Heart healthy/carb modified  Filed Weights   07/23/14 1333  Weight: 84.823 kg (187 lb)    History of present illness:  Please see admit h and p from 12/7 for details. Briefly, pt presents with acute L sided weakness. The patient was admitted for further work up.  Hospital Course:  1. LE weakness 1. No acute CVA on MRI and recent ICH is improving 2. Lumbar MRI with spondylosis lumbar region, worse at L4-5 - suspect if this is main culprit of pt's presenting LE weakness 3. Stroke team has since signed off 4. Pt to cont with outpt PT 5. Discussed case with pt's Neurosurgeon, Dr. Arnoldo Morale who recommends close outpatient follow up 2. Recent subdural hematoma 1. Resolving on serial imaging 2. Cont to monitor for now 3. Questionable UTI 1. No predominant organism on urine cx 2. Afebrile 3. No leukocytosis 4. Had been started on empiric rocephin 4. Low back pain 1. Secondary to above 2. As pt is still symptomatic, discussed case with Neurosurgeon per above, with recs for outpatient follow up 5. DM2 1. Glucose stable 6. Anxiety 1. stable 7. Hypothyroid 1. Cont thyroid replacement 8. HTN 1. bp stable, albeit suboptimal 2. Will reorder home amlodipine in addition to current regimen 9. DVT prophylaxis  1. SCD's  Consultations:  Neurology  Discussed with  Neurosurgery over phone  Discharge Exam: Filed Vitals:   07/24/14 2208 07/25/14 0300 07/25/14 0553 07/25/14 0955  BP: 134/47 132/44 158/58 163/63  Pulse: 50 54 68 63  Temp: 97.9 F (36.6 C) 98.1 F (36.7 C) 98.2 F (36.8 C) 97.8 F (36.6 C)  TempSrc: Oral Oral Oral Oral  Resp: 18 18 16 18   Height:      Weight:      SpO2: 94% 94% 96% 96%    General: awake, in nad Cardiovascular: regular, s1, s2 Respiratory: normal resp effort, no wheezing  Discharge Instructions      Discharge Instructions    Ambulatory referral to Physical Therapy    Complete by:  As directed   Resume outpatient PT services            Medication List    TAKE these medications        ALPRAZolam 0.5 MG tablet  Commonly known as:  XANAX  Take 1 mg by mouth at bedtime as needed for anxiety.     amLODipine 10 MG tablet  Commonly known as:  NORVASC  Take 10 mg by mouth daily.     ANTIOXIDANT PO  Take 1 tablet by mouth daily.     azelastine 0.1 % nasal spray  Commonly known as:  ASTELIN  Place 2 sprays into both nostrils daily. Use in each nostril as directed     bisacodyl 5 MG EC tablet  Commonly known as:  DULCOLAX  Take 1 tablet (5 mg total) by mouth daily as needed for moderate constipation.  doxazosin 2 MG tablet  Commonly known as:  CARDURA  Take 2 mg by mouth 2 (two) times daily.     esomeprazole 40 MG capsule  Commonly known as:  NEXIUM  Take 40 mg by mouth daily at 12 noon.     furosemide 40 MG tablet  Commonly known as:  LASIX  Take 20 mg by mouth daily.     HYDROcodone-acetaminophen 5-325 MG per tablet  Commonly known as:  NORCO/VICODIN  Take 1 tablet by mouth every 4 (four) hours as needed for moderate pain.     irbesartan 150 MG tablet  Commonly known as:  AVAPRO  Take 150 mg by mouth 2 (two) times daily.     levothyroxine 125 MCG tablet  Commonly known as:  SYNTHROID, LEVOTHROID  Take 125 mcg by mouth daily before breakfast.     metFORMIN 1000 MG tablet   Commonly known as:  GLUCOPHAGE  Take 1,000 mg by mouth 2 (two) times daily with a meal.     metoprolol succinate 50 MG 24 hr tablet  Commonly known as:  TOPROL-XL  Take 50 mg by mouth 2 (two) times daily. Take with or immediately following a meal.     ondansetron 4 MG tablet  Commonly known as:  ZOFRAN  Take 1 tablet (4 mg total) by mouth every 8 (eight) hours as needed for nausea or vomiting.     rosuvastatin 20 MG tablet  Commonly known as:  CRESTOR  Take 20 mg by mouth daily.     sitaGLIPtin 100 MG tablet  Commonly known as:  JANUVIA  Take 100 mg by mouth daily.       No Known Allergies Follow-up Information    Follow up with Gennette Pac, MD. Schedule an appointment as soon as possible for a visit in 1 week.   Specialty:  Family Medicine   Contact information:   [address] [address] [phone]       Follow up with Ophelia Charter, MD.   Specialty:  Neurosurgery   Why:  on 12/16 at 3:45pm   Contact information:   [address] [place]  [address] [phone]        The results of significant diagnostics from this hospitalization (including imaging, microbiology, ancillary and laboratory) are listed below for reference.    Significant Diagnostic Studies: Dg Chest 2 View  07/23/2014   CLINICAL DATA:  Acute stroke symptoms, hypertension and diabetes, acute intracranial hemorrhage  EXAM: CHEST - 2 VIEW  COMPARISON:  05/07/2014  FINDINGS: Stable cardiomegaly without CHF or pneumonia. No focal collapse, consolidation, or edema. No effusion or pneumothorax. Trachea midline. Postop changes from bilateral shoulder arthroplasties and thyroidectomy. No acute osseous finding. Lower thoracic and lumbar degenerative changes.  IMPRESSION: Cardiomegaly without acute process.  Stable exam.   Electronically Signed   By: Daryll Brod M.D.   On: 07/23/2014 20:09   Dg Sacrum/coccyx  07/07/2014   CLINICAL DATA:  Fall with sacral and  coccyx injury. Initial encounter.  EXAM: SACRUM AND COCCYX - 2+ VIEW  COMPARISON:  None.  FINDINGS: No acute fracture identified. The sacroiliac joints show mild degenerative changes bilaterally. The coccyx shows normal alignment. Moderate spondylosis noted in the lower lumbar spine. No bony lesions identified.  IMPRESSION: No evidence of acute fracture involving the sacrum or coccyx.   Electronically Signed   By: Aletta Edouard M.D.   On: 07/07/2014 11:30   Ct Head Wo Contrast  07/23/2014  CLINICAL DATA:  Subdural hematoma diagnosed 07/07/2014. Weakness in the left leg today.  EXAM: CT HEAD WITHOUT CONTRAST  TECHNIQUE: Contiguous axial images were obtained from the base of the skull through the vertex without intravenous contrast.  COMPARISON:  07/20/2014 and multiple previous  FINDINGS: Resolving subdural hematoma along the falx anteriorly continues to undergo expected evolutionary resolution. No evidence of new or worsened bleeding. As seen previously, the largest component is along the right side of the falx. This is much less distinct due to expected evolutionary changes. Minimal mass effect upon the medial aspect of the right frontal lobe persists. Elsewhere, the brain shows chronic appearing small vessel ischemic changes affecting the pons and the cerebral hemispheric white matter. No sign of acute infarction, neoplastic mass lesion or hydrocephalus. No skull fracture. Fluid is present within the air cells of the mastoid tip on the left as seen previously.  IMPRESSION: No new or acute bleeding.  No sign of new infarction.  Expected evolutionary changes of resolving subdural hematoma along the falx.   Electronically Signed   By: Nelson Chimes M.D.   On: 07/23/2014 15:43   Ct Head Wo Contrast  07/20/2014   CLINICAL DATA:  Followup subdural hematoma. Patient fell 13 days ago. Dizziness.  EXAM: CT HEAD WITHOUT CONTRAST  TECHNIQUE: Contiguous axial images were obtained from the base of the skull through the  vertex without contrast.  COMPARISON:  CT head 07/08/14 most recent. Initial head CT 07/07/2014.  FINDINGS: There is partial resolution of the RIGHT frontal parafalcine subdural hematoma, with decreased thickness of 11 mm as compared to 14 mm initially, as well as decreasing attenuation of the hematoma. No new extra-axial collections are seen. There is no delayed parenchymal hemorrhage.  Mild atrophy is redemonstrated. There is hypoattenuation of white matter suggesting chronic microvascular ischemic change. No skull fracture is evident. Small scalp staples are seen near the vertex with resolving scalp hematoma.  No acute sinus or mastoid fluid.  Previous RIGHT ocular surgery.  IMPRESSION: Improving RIGHT frontal parafalcine hematoma.   Electronically Signed   By: Rolla Flatten M.D.   On: 07/20/2014 16:48   Ct Head Wo Contrast  07/08/2014   CLINICAL DATA:  Follow-up examination of intracranial hemorrhage.  EXAM: CT HEAD WITHOUT CONTRAST  TECHNIQUE: Contiguous axial images were obtained from the base of the skull through the vertex without intravenous contrast.  COMPARISON:  Prior CT from 07/07/2014  FINDINGS: Previously identified right parafalcine acute subdural hemorrhage is not significantly changed in size measuring up to 14 mm in maximal thickness. Mild mass effect on the adjacent parasagittal right frontal lobe is stable. No midline shift or hydrocephalus.  No new intracranial hemorrhage or large vessel territory infarct. Atrophy with chronic microvascular ischemic changes again noted.  Skin staples present at the right parietal scalp. Calvarium intact. No acute abnormality seen about the orbits.  Paranasal sinuses remain clear. Inferior left mastoid air cells are opacified, unchanged.  IMPRESSION: 1. Stable size and morphology of acute right parafalcine subdural hemorrhage measuring up to 13 mm in maximal thickness. No midline shift or hydrocephalus. 2. No new intracranial process.   Electronically Signed    By: Jeannine Boga M.D.   On: 07/08/2014 06:55   Ct Head Wo Contrast  07/07/2014   CLINICAL DATA:  Fall.  Headache.  Posterior swelling.  EXAM: CT HEAD WITHOUT CONTRAST  CT CERVICAL SPINE WITHOUT CONTRAST  TECHNIQUE: Multidetector CT imaging of the head and cervical spine was performed following the standard protocol  without intravenous contrast. Multiplanar CT image reconstructions of the cervical spine were also generated.  COMPARISON:  07/11/2007  FINDINGS: CT HEAD FINDINGS  There is a 14 mm thick dense collection along the anterior falx compatible with subdural hematoma. Mild mass effect on the adjacent right frontal lobe. No significant midline shift. No hydrocephalus. No intra-axial blood. No acute infarction.  Posterior scalp laceration and swelling. No underlying acute calvarial abnormality.  CT CERVICAL SPINE FINDINGS  Degenerative disc disease and facet disease throughout the cervical spine. Fusion across the facet joints at C2-3. No fracture or subluxation. Prevertebral soft tissues are normal. No epidural or paraspinal hematoma.  IMPRESSION: 14 mm thick subdural hematoma along the anterior falx with mild mass effect on the adjacent right frontal lobe. No midline shift. No intra-axial hemorrhage. No hydrocephalus.  No acute bony abnormality in the cervical spine.  Critical Value/emergent results were called by telephone at the time of interpretation on 07/07/2014 at 11:45 am to Dr. Blanchie Dessert , who verbally acknowledged these results.   Electronically Signed   By: Rolm Baptise M.D.   On: 07/07/2014 11:45   Ct Cervical Spine Wo Contrast  07/07/2014   CLINICAL DATA:  Fall.  Headache.  Posterior swelling.  EXAM: CT HEAD WITHOUT CONTRAST  CT CERVICAL SPINE WITHOUT CONTRAST  TECHNIQUE: Multidetector CT imaging of the head and cervical spine was performed following the standard protocol without intravenous contrast. Multiplanar CT image reconstructions of the cervical spine were also  generated.  COMPARISON:  07/11/2007  FINDINGS: CT HEAD FINDINGS  There is a 14 mm thick dense collection along the anterior falx compatible with subdural hematoma. Mild mass effect on the adjacent right frontal lobe. No significant midline shift. No hydrocephalus. No intra-axial blood. No acute infarction.  Posterior scalp laceration and swelling. No underlying acute calvarial abnormality.  CT CERVICAL SPINE FINDINGS  Degenerative disc disease and facet disease throughout the cervical spine. Fusion across the facet joints at C2-3. No fracture or subluxation. Prevertebral soft tissues are normal. No epidural or paraspinal hematoma.  IMPRESSION: 14 mm thick subdural hematoma along the anterior falx with mild mass effect on the adjacent right frontal lobe. No midline shift. No intra-axial hemorrhage. No hydrocephalus.  No acute bony abnormality in the cervical spine.  Critical Value/emergent results were called by telephone at the time of interpretation on 07/07/2014 at 11:45 am to Dr. Blanchie Dessert , who verbally acknowledged these results.   Electronically Signed   By: Rolm Baptise M.D.   On: 07/07/2014 11:45   Mr Brain Wo Contrast  07/24/2014   CLINICAL DATA:  Persistent dizziness and imbalance following hospitalization 2 weeks ago for subdural hematoma. Increased confusion and disorientation. Sudden onset of left-sided weakness. Question CVA.  EXAM: MRI HEAD WITHOUT CONTRAST  MRA HEAD WITHOUT CONTRAST  TECHNIQUE: Multiplanar, multiecho pulse sequences of the brain and surrounding structures were obtained without intravenous contrast. Angiographic images of the head were obtained using MRA technique without contrast.  COMPARISON:  CT head without contrast 07/23/2014.  FINDINGS: MRI HEAD FINDINGS  The subdural hematoma adjacent to the medial right frontal lobe is again seen. The diffusion-weighted images demonstrate no acute or subacute infarct. No new hemorrhage is present.  A small amount of subarachnoid  hemorrhage is suggested over the right frontal convexity adjacent to the subdural collection. There is an arachnoid cyst adjacent to the hemorrhage as well, a chronic finding.  Moderate periventricular and scattered subcortical white matter changes are evident bilaterally.  Flow is present in  the major intracranial arteries. The patient is status post bilateral lens replacements. The globes and orbits are otherwise intact. Artifact is present from the right maxilla. The paranasal sinuses are clear. There is fluid in the mastoid air cells bilaterally. No obstructing nasopharyngeal lesion is evident.  MRA HEAD FINDINGS  Atherosclerotic changes are present in the cavernous internal carotid arteries bilaterally, worse on the right. Signal loss in the anterior genu of the right cavernous internal carotid artery may be artifactually exaggerated by adjacent susceptibility artifact. The A1 and M1 segments are normal. The anterior communicating artery is patent. The MCA bifurcations are intact. ACA and MCA branch vessels are within normal limits.  The left vertebral artery is the dominant vessel. The PICA origins are visualized and normal bilaterally. The basilar artery is within normal limits. Both posterior cerebral arteries originate from the basilar tip. The PCA branch vessels are within normal limits.  IMPRESSION: 1. Persistent medial right subdural hematoma, improved from 2 weeks ago. 2. Probable adjacent subarachnoid blood. 3. No acute hemorrhage. 4. Moderate atrophy and white matter disease bilaterally. 5. Bilateral mastoid effusions. No obstructing nasopharyngeal lesion is evident. 6. Atherosclerotic changes in the cavernous internal carotid arteries bilaterally. The degree of narrowing is likely exaggerated on the right due to adjacent susceptibility. 7. Otherwise normal MRA circle of Willis without evidence for significant proximal stenosis, aneurysm, or branch vessel occlusion.   Electronically Signed   By: Lawrence Santiago M.D.   On: 07/24/2014 09:03   Mr Lumbar Spine Wo Contrast  07/24/2014   CLINICAL DATA:  Sacral back pain. Intracranial subdural hematoma. Dizziness. Weakness. Urinary incontinence. Low back pain limiting mobility since a recent fall.  EXAM: MRI LUMBAR SPINE WITHOUT CONTRAST  TECHNIQUE: Multiplanar, multisequence MR imaging of the lumbar spine was performed. No intravenous contrast was administered.  COMPARISON:  07/07/2014; 11/04/2005  FINDINGS: Mildly transitional L5 vertebra. The conus medullaris appears normal. Conus level: L1.  There is 3 mm degenerative retrolisthesis at L1- 2 and L3-4 ; 5 mm degenerative retrolisthesis at L2-3; and 6 mm degenerative anterolisthesis at L4-5. Loss of disc height at all levels in the lumbar spine but most especially at L5-S 1. There is also considerable loss of disc height at T11-12. Type 1 degenerative endplate findings observed at L2-3 and L5-S1. Degenerative facet edema is present at multiple levels but particularly at L4-5.  Despite efforts by the technologist and patient, motion artifact is present on today's exam and could not be eliminated. This reduces exam sensitivity and specificity.  Additional findings at individual levels are as follows:  T10-11: Borderline central narrowing of the thecal sac due to small central disc protrusion. Bilateral facet arthropathy. This level is only included on the parasagittal images.  T11-12:  No impingement.  Disc osteophyte complex noted.  T12-L1:  No impingement.  Mild disc bulge.  L1-2: No overt impingement. Disc bulge and facet arthropathy noted.  L2-3: Mild right and borderline left foraminal stenosis with mild bilateral subarticular lateral recess stenosis and mild displacement of the left L3 nerve in the lateral extraforaminal space due to disc bulge, facet arthropathy, and ligamentum flavum redundancy.  L3-4: Mild bilateral foraminal stenosis with mild right subarticular lateral recess stenosis and mild central  narrowing of the thecal sac due to disc bulge, right foraminal disc protrusion, intervertebral spurring, and facet arthropathy.  L4-5: Prominent central narrowing of the thecal sac with moderate right and mild left subarticular lateral recess stenosis and mild bilateral foraminal stenosis due to disc uncovering, facet  arthropathy, and ligamentum flavum redundancy.  L5-S1: Mild left foraminal stenosis with mild bilateral subarticular lateral recess stenosis and displacement of the left L5 nerve in the lateral extraforaminal space due to facet arthropathy, intervertebral spurring, disc bulge, and a left lateral extraforaminal disc protrusion.  No edema centrally in the sacrum.  The coccyx was not included.  IMPRESSION: 1. Lumbar spondylosis and degenerative disc disease, causing prominent impingement at L4-5 and mild impingement at L2-3, L3-4, and L5-S1, as detailed above. 2. Multilevel degenerative subluxations in the lumbar spine.   Electronically Signed   By: Sherryl Barters M.D.   On: 07/24/2014 09:02   Mr Jodene Nam Head/brain Wo Cm  07/24/2014   CLINICAL DATA:  Persistent dizziness and imbalance following hospitalization 2 weeks ago for subdural hematoma. Increased confusion and disorientation. Sudden onset of left-sided weakness. Question CVA.  EXAM: MRI HEAD WITHOUT CONTRAST  MRA HEAD WITHOUT CONTRAST  TECHNIQUE: Multiplanar, multiecho pulse sequences of the brain and surrounding structures were obtained without intravenous contrast. Angiographic images of the head were obtained using MRA technique without contrast.  COMPARISON:  CT head without contrast 07/23/2014.  FINDINGS: MRI HEAD FINDINGS  The subdural hematoma adjacent to the medial right frontal lobe is again seen. The diffusion-weighted images demonstrate no acute or subacute infarct. No new hemorrhage is present.  A small amount of subarachnoid hemorrhage is suggested over the right frontal convexity adjacent to the subdural collection. There is an  arachnoid cyst adjacent to the hemorrhage as well, a chronic finding.  Moderate periventricular and scattered subcortical white matter changes are evident bilaterally.  Flow is present in the major intracranial arteries. The patient is status post bilateral lens replacements. The globes and orbits are otherwise intact. Artifact is present from the right maxilla. The paranasal sinuses are clear. There is fluid in the mastoid air cells bilaterally. No obstructing nasopharyngeal lesion is evident.  MRA HEAD FINDINGS  Atherosclerotic changes are present in the cavernous internal carotid arteries bilaterally, worse on the right. Signal loss in the anterior genu of the right cavernous internal carotid artery may be artifactually exaggerated by adjacent susceptibility artifact. The A1 and M1 segments are normal. The anterior communicating artery is patent. The MCA bifurcations are intact. ACA and MCA branch vessels are within normal limits.  The left vertebral artery is the dominant vessel. The PICA origins are visualized and normal bilaterally. The basilar artery is within normal limits. Both posterior cerebral arteries originate from the basilar tip. The PCA branch vessels are within normal limits.  IMPRESSION: 1. Persistent medial right subdural hematoma, improved from 2 weeks ago. 2. Probable adjacent subarachnoid blood. 3. No acute hemorrhage. 4. Moderate atrophy and white matter disease bilaterally. 5. Bilateral mastoid effusions. No obstructing nasopharyngeal lesion is evident. 6. Atherosclerotic changes in the cavernous internal carotid arteries bilaterally. The degree of narrowing is likely exaggerated on the right due to adjacent susceptibility. 7. Otherwise normal MRA circle of Willis without evidence for significant proximal stenosis, aneurysm, or branch vessel occlusion.   Electronically Signed   By: Lawrence Santiago M.D.   On: 07/24/2014 09:03    Microbiology: Recent Results (from the past 240 hour(s))   Urine culture     Status: None   Collection Time: 07/23/14  2:45 PM  Result Value Ref Range Status   Specimen Description URINE, CLEAN CATCH  Final   Special Requests NONE  Final   Culture  Setup Time   Final    07/23/2014 22:15 Performed at [facility]  Colony Count   Final    >=100,000 COLONIES/ML Performed at [facility]    Culture   Final    Multiple bacterial morphotypes present, none predominant. Suggest appropriate recollection if clinically indicated. Performed at [facility]    Report Status 07/24/2014 FINAL  Final  Culture, blood (routine x 2)     Status: None (Preliminary result)   Collection Time: 07/23/14  2:48 PM  Result Value Ref Range Status   Specimen Description BLOOD ARM LEFT  Final   Special Requests BOTTLES DRAWN AEROBIC AND ANAEROBIC 5CC  Final   Culture  Setup Time   Final    07/23/2014 21:52 Performed at [facility]    Culture   Final           BLOOD CULTURE RECEIVED NO GROWTH TO DATE CULTURE WILL BE HELD FOR 5 DAYS BEFORE ISSUING A FINAL NEGATIVE REPORT Performed at [facility]    Report Status PENDING  Incomplete  Culture, blood (routine x 2)     Status: None (Preliminary result)   Collection Time: 07/23/14  2:58 PM  Result Value Ref Range Status   Specimen Description BLOOD LEFT HAND  Final   Special Requests BOTTLES DRAWN AEROBIC AND ANAEROBIC 5CC  Final   Culture  Setup Time   Final    07/23/2014 21:54 Performed at [facility]    Culture   Final           BLOOD CULTURE RECEIVED NO GROWTH TO DATE CULTURE WILL BE HELD FOR 5 DAYS BEFORE ISSUING A FINAL NEGATIVE REPORT Performed at [facility]    Report Status PENDING  Incomplete     Labs: Basic Metabolic Panel:  Recent Labs Lab 07/23/14 1432 07/25/14 0353  NA 137 137  K 4.0 3.7  CL 98 101  CO2 22 24  GLUCOSE 132* 175*  BUN 10 16  CREATININE 0.50 0.57  CALCIUM 9.9 9.5   Liver Function Tests:  Recent Labs Lab  07/23/14 1432  AST 24  ALT 16  ALKPHOS 82  BILITOT 0.3  PROT 7.6  ALBUMIN 3.6   No results for input(s): LIPASE, AMYLASE in the last 168 hours. No results for input(s): AMMONIA in the last 168 hours. CBC:  Recent Labs Lab 07/23/14 1432 07/24/14 0450 07/25/14 0353  WBC 4.8 4.1 4.8  NEUTROABS 3.3  --   --   HGB 12.9 11.1* 10.6*  HCT 39.4 34.7* 32.7*  MCV 87.2 87.6 87.7  PLT 148* 160 160   Cardiac Enzymes:  Recent Labs Lab 07/23/14 1432  TROPONINI <0.30   BNP: BNP (last 3 results) No results for input(s): PROBNP in the last 8760 hours. CBG:  Recent Labs Lab 07/24/14 1150 07/24/14 1638 07/24/14 2216 07/25/14 0759 07/25/14 1134  GLUCAP 137* 209* 115* 206* 165*    Signed:  CHIU, STEPHEN K  [facility] 07/25/2014, 2:26 PM

## 2014-07-25 NOTE — Progress Notes (Signed)
Pt transported out of unit perwheelchair by Jonelle Sidle, nurse tech.  No acute distress noted. Hailey Cobb I 07/25/2014 4:49 PM

## 2014-07-25 NOTE — Progress Notes (Signed)
[facility] PROGRESS NOTE  Hailey Cobb MLY:[id number] DOB: 06/19/1932 DOA: 07/23/2014 PCP: Gennette Pac, MD  Assessment/Plan: 1. LE weakness 1. No acute CVA on MRI and recent ICH is improving 2. Lumbar MRI with spondylosis lumbar region, worse at L4-5 - question if this is main culprit of pt's presenting weakness 3. Stroke team has since signed off 4. Pt to cont with outpt PT 2. Recent subdural hematoma 1. Resolving on serial imaging 2. Cont to monitor for now 3. Questionable UTI 1. No predominant organism on urine cx 2. Afebrile 3. No leukocytosis 4. Had been started on empiric rocephin 4. Low back pain 1. Secondary to above 2. As pt is still symptomatic, would consult Neurosurgery for further recommendations 5. DM2 1. Glucose stable 6. Anxiety 1. stable 7. Hypothyroid 1. Cont thyroid replacement 8. HTN 1. bp stable, albeit suboptimal 2. Will reorder home amlodipine in addition to current regimen 9. DVT prophylaxis  1. SCD's  Code Status: Full Family Communication: Pt and family in room (indicate person spoken with, relationship, and if by phone, the number) Disposition Plan: Pending   Consultants:  Neurology  Procedures:    Antibiotics:  Rocephin 12/8>>>   HPI/Subjective: Reports another episode of marked leg weakness earlier this AM, was transient.  Objective: Filed Vitals:   07/24/14 2208 07/25/14 0300 07/25/14 0553 07/25/14 0955  BP: 134/47 132/44 158/58 163/63  Pulse: 50 54 68 63  Temp: 97.9 F (36.6 C) 98.1 F (36.7 C) 98.2 F (36.8 C) 97.8 F (36.6 C)  TempSrc: Oral Oral Oral Oral  Resp: 18 18 16 18   Height:      Weight:      SpO2: 94% 94% 96% 96%    Intake/Output Summary (Last 24 hours) at 07/25/14 1136 Last data filed at 07/25/14 0957  Gross per 24 hour  Intake      6 ml  Output      0 ml  Net      6 ml   Filed Weights   07/23/14 1333  Weight: 84.823 kg (187 lb)    Exam:   General:  Awake, in  nad  Cardiovascular: regular,s 1, s2  Respiratory: normal resp effort, no wheezing  Abdomen: soft,nondistended  Musculoskeletal: perfused, no clubbing   Data Reviewed: Basic Metabolic Panel:  Recent Labs Lab 07/23/14 1432 07/25/14 0353  NA 137 137  K 4.0 3.7  CL 98 101  CO2 22 24  GLUCOSE 132* 175*  BUN 10 16  CREATININE 0.50 0.57  CALCIUM 9.9 9.5   Liver Function Tests:  Recent Labs Lab 07/23/14 1432  AST 24  ALT 16  ALKPHOS 82  BILITOT 0.3  PROT 7.6  ALBUMIN 3.6   No results for input(s): LIPASE, AMYLASE in the last 168 hours. No results for input(s): AMMONIA in the last 168 hours. CBC:  Recent Labs Lab 07/23/14 1432 07/24/14 0450 07/25/14 0353  WBC 4.8 4.1 4.8  NEUTROABS 3.3  --   --   HGB 12.9 11.1* 10.6*  HCT 39.4 34.7* 32.7*  MCV 87.2 87.6 87.7  PLT 148* 160 160   Cardiac Enzymes:  Recent Labs Lab 07/23/14 1432  TROPONINI <0.30   BNP (last 3 results) No results for input(s): PROBNP in the last 8760 hours. CBG:  Recent Labs Lab 07/24/14 0912 07/24/14 1150 07/24/14 1638 07/24/14 2216 07/25/14 0759  GLUCAP 175* 137* 209* 115* 206*    Recent Results (from the past 240 hour(s))  Urine culture     Status: None  Collection Time: 07/23/14  2:45 PM  Result Value Ref Range Status   Specimen Description URINE, CLEAN CATCH  Final   Special Requests NONE  Final   Culture  Setup Time   Final    07/23/2014 22:15 Performed at [place]   Final    >=100,000 COLONIES/ML Performed at [facility]    Culture   Final    Multiple bacterial morphotypes present, none predominant. Suggest appropriate recollection if clinically indicated. Performed at [facility]    Report Status 07/24/2014 FINAL  Final  Culture, blood (routine x 2)     Status: None (Preliminary result)   Collection Time: 07/23/14  2:48 PM  Result Value Ref Range Status   Specimen Description BLOOD ARM LEFT  Final   Special  Requests BOTTLES DRAWN AEROBIC AND ANAEROBIC 5CC  Final   Culture  Setup Time   Final    07/23/2014 21:52 Performed at [facility]    Culture   Final           BLOOD CULTURE RECEIVED NO GROWTH TO DATE CULTURE WILL BE HELD FOR 5 DAYS BEFORE ISSUING A FINAL NEGATIVE REPORT Performed at [facility]    Report Status PENDING  Incomplete  Culture, blood (routine x 2)     Status: None (Preliminary result)   Collection Time: 07/23/14  2:58 PM  Result Value Ref Range Status   Specimen Description BLOOD LEFT HAND  Final   Special Requests BOTTLES DRAWN AEROBIC AND ANAEROBIC 5CC  Final   Culture  Setup Time   Final    07/23/2014 21:54 Performed at [facility]    Culture   Final           BLOOD CULTURE RECEIVED NO GROWTH TO DATE CULTURE WILL BE HELD FOR 5 DAYS BEFORE ISSUING A FINAL NEGATIVE REPORT Performed at [facility]    Report Status PENDING  Incomplete     Studies: Dg Chest 2 View  07/23/2014   CLINICAL DATA:  Acute stroke symptoms, hypertension and diabetes, acute intracranial hemorrhage  EXAM: CHEST - 2 VIEW  COMPARISON:  05/07/2014  FINDINGS: Stable cardiomegaly without CHF or pneumonia. No focal collapse, consolidation, or edema. No effusion or pneumothorax. Trachea midline. Postop changes from bilateral shoulder arthroplasties and thyroidectomy. No acute osseous finding. Lower thoracic and lumbar degenerative changes.  IMPRESSION: Cardiomegaly without acute process.  Stable exam.   Electronically Signed   By: Daryll Brod M.D.   On: 07/23/2014 20:09   Ct Head Wo Contrast  07/23/2014   CLINICAL DATA:  Subdural hematoma diagnosed 07/07/2014. Weakness in the left leg today.  EXAM: CT HEAD WITHOUT CONTRAST  TECHNIQUE: Contiguous axial images were obtained from the base of the skull through the vertex without intravenous contrast.  COMPARISON:  07/20/2014 and multiple previous  FINDINGS: Resolving subdural hematoma along the falx anteriorly continues to  undergo expected evolutionary resolution. No evidence of new or worsened bleeding. As seen previously, the largest component is along the right side of the falx. This is much less distinct due to expected evolutionary changes. Minimal mass effect upon the medial aspect of the right frontal lobe persists. Elsewhere, the brain shows chronic appearing small vessel ischemic changes affecting the pons and the cerebral hemispheric white matter. No sign of acute infarction, neoplastic mass lesion or hydrocephalus. No skull fracture. Fluid is present within the air cells of the mastoid tip on the left as seen previously.  IMPRESSION: No new or acute bleeding.  No sign of new infarction.  Expected evolutionary changes of resolving subdural hematoma along the falx.   Electronically Signed   By: Nelson Chimes M.D.   On: 07/23/2014 15:43   Mr Brain Wo Contrast  07/24/2014   CLINICAL DATA:  Persistent dizziness and imbalance following hospitalization 2 weeks ago for subdural hematoma. Increased confusion and disorientation. Sudden onset of left-sided weakness. Question CVA.  EXAM: MRI HEAD WITHOUT CONTRAST  MRA HEAD WITHOUT CONTRAST  TECHNIQUE: Multiplanar, multiecho pulse sequences of the brain and surrounding structures were obtained without intravenous contrast. Angiographic images of the head were obtained using MRA technique without contrast.  COMPARISON:  CT head without contrast 07/23/2014.  FINDINGS: MRI HEAD FINDINGS  The subdural hematoma adjacent to the medial right frontal lobe is again seen. The diffusion-weighted images demonstrate no acute or subacute infarct. No new hemorrhage is present.  A small amount of subarachnoid hemorrhage is suggested over the right frontal convexity adjacent to the subdural collection. There is an arachnoid cyst adjacent to the hemorrhage as well, a chronic finding.  Moderate periventricular and scattered subcortical white matter changes are evident bilaterally.  Flow is present in  the major intracranial arteries. The patient is status post bilateral lens replacements. The globes and orbits are otherwise intact. Artifact is present from the right maxilla. The paranasal sinuses are clear. There is fluid in the mastoid air cells bilaterally. No obstructing nasopharyngeal lesion is evident.  MRA HEAD FINDINGS  Atherosclerotic changes are present in the cavernous internal carotid arteries bilaterally, worse on the right. Signal loss in the anterior genu of the right cavernous internal carotid artery may be artifactually exaggerated by adjacent susceptibility artifact. The A1 and M1 segments are normal. The anterior communicating artery is patent. The MCA bifurcations are intact. ACA and MCA branch vessels are within normal limits.  The left vertebral artery is the dominant vessel. The PICA origins are visualized and normal bilaterally. The basilar artery is within normal limits. Both posterior cerebral arteries originate from the basilar tip. The PCA branch vessels are within normal limits.  IMPRESSION: 1. Persistent medial right subdural hematoma, improved from 2 weeks ago. 2. Probable adjacent subarachnoid blood. 3. No acute hemorrhage. 4. Moderate atrophy and white matter disease bilaterally. 5. Bilateral mastoid effusions. No obstructing nasopharyngeal lesion is evident. 6. Atherosclerotic changes in the cavernous internal carotid arteries bilaterally. The degree of narrowing is likely exaggerated on the right due to adjacent susceptibility. 7. Otherwise normal MRA circle of Willis without evidence for significant proximal stenosis, aneurysm, or branch vessel occlusion.   Electronically Signed   By: Lawrence Santiago M.D.   On: 07/24/2014 09:03   Mr Lumbar Spine Wo Contrast  07/24/2014   CLINICAL DATA:  Sacral back pain. Intracranial subdural hematoma. Dizziness. Weakness. Urinary incontinence. Low back pain limiting mobility since a recent fall.  EXAM: MRI LUMBAR SPINE WITHOUT CONTRAST   TECHNIQUE: Multiplanar, multisequence MR imaging of the lumbar spine was performed. No intravenous contrast was administered.  COMPARISON:  07/07/2014; 11/04/2005  FINDINGS: Mildly transitional L5 vertebra. The conus medullaris appears normal. Conus level: L1.  There is 3 mm degenerative retrolisthesis at L1- 2 and L3-4 ; 5 mm degenerative retrolisthesis at L2-3; and 6 mm degenerative anterolisthesis at L4-5. Loss of disc height at all levels in the lumbar spine but most especially at L5-S 1. There is also considerable loss of disc height at T11-12. Type 1 degenerative endplate findings observed at L2-3 and L5-S1.  Degenerative facet edema is present at multiple levels but particularly at L4-5.  Despite efforts by the technologist and patient, motion artifact is present on today's exam and could not be eliminated. This reduces exam sensitivity and specificity.  Additional findings at individual levels are as follows:  T10-11: Borderline central narrowing of the thecal sac due to small central disc protrusion. Bilateral facet arthropathy. This level is only included on the parasagittal images.  T11-12:  No impingement.  Disc osteophyte complex noted.  T12-L1:  No impingement.  Mild disc bulge.  L1-2: No overt impingement. Disc bulge and facet arthropathy noted.  L2-3: Mild right and borderline left foraminal stenosis with mild bilateral subarticular lateral recess stenosis and mild displacement of the left L3 nerve in the lateral extraforaminal space due to disc bulge, facet arthropathy, and ligamentum flavum redundancy.  L3-4: Mild bilateral foraminal stenosis with mild right subarticular lateral recess stenosis and mild central narrowing of the thecal sac due to disc bulge, right foraminal disc protrusion, intervertebral spurring, and facet arthropathy.  L4-5: Prominent central narrowing of the thecal sac with moderate right and mild left subarticular lateral recess stenosis and mild bilateral foraminal stenosis due  to disc uncovering, facet arthropathy, and ligamentum flavum redundancy.  L5-S1: Mild left foraminal stenosis with mild bilateral subarticular lateral recess stenosis and displacement of the left L5 nerve in the lateral extraforaminal space due to facet arthropathy, intervertebral spurring, disc bulge, and a left lateral extraforaminal disc protrusion.  No edema centrally in the sacrum.  The coccyx was not included.  IMPRESSION: 1. Lumbar spondylosis and degenerative disc disease, causing prominent impingement at L4-5 and mild impingement at L2-3, L3-4, and L5-S1, as detailed above. 2. Multilevel degenerative subluxations in the lumbar spine.   Electronically Signed   By: Sherryl Barters M.D.   On: 07/24/2014 09:02   Mr Jodene Nam Head/brain Wo Cm  07/24/2014   CLINICAL DATA:  Persistent dizziness and imbalance following hospitalization 2 weeks ago for subdural hematoma. Increased confusion and disorientation. Sudden onset of left-sided weakness. Question CVA.  EXAM: MRI HEAD WITHOUT CONTRAST  MRA HEAD WITHOUT CONTRAST  TECHNIQUE: Multiplanar, multiecho pulse sequences of the brain and surrounding structures were obtained without intravenous contrast. Angiographic images of the head were obtained using MRA technique without contrast.  COMPARISON:  CT head without contrast 07/23/2014.  FINDINGS: MRI HEAD FINDINGS  The subdural hematoma adjacent to the medial right frontal lobe is again seen. The diffusion-weighted images demonstrate no acute or subacute infarct. No new hemorrhage is present.  A small amount of subarachnoid hemorrhage is suggested over the right frontal convexity adjacent to the subdural collection. There is an arachnoid cyst adjacent to the hemorrhage as well, a chronic finding.  Moderate periventricular and scattered subcortical white matter changes are evident bilaterally.  Flow is present in the major intracranial arteries. The patient is status post bilateral lens replacements. The globes and orbits  are otherwise intact. Artifact is present from the right maxilla. The paranasal sinuses are clear. There is fluid in the mastoid air cells bilaterally. No obstructing nasopharyngeal lesion is evident.  MRA HEAD FINDINGS  Atherosclerotic changes are present in the cavernous internal carotid arteries bilaterally, worse on the right. Signal loss in the anterior genu of the right cavernous internal carotid artery may be artifactually exaggerated by adjacent susceptibility artifact. The A1 and M1 segments are normal. The anterior communicating artery is patent. The MCA bifurcations are intact. ACA and MCA branch vessels are within normal limits.  The left  vertebral artery is the dominant vessel. The PICA origins are visualized and normal bilaterally. The basilar artery is within normal limits. Both posterior cerebral arteries originate from the basilar tip. The PCA branch vessels are within normal limits.  IMPRESSION: 1. Persistent medial right subdural hematoma, improved from 2 weeks ago. 2. Probable adjacent subarachnoid blood. 3. No acute hemorrhage. 4. Moderate atrophy and white matter disease bilaterally. 5. Bilateral mastoid effusions. No obstructing nasopharyngeal lesion is evident. 6. Atherosclerotic changes in the cavernous internal carotid arteries bilaterally. The degree of narrowing is likely exaggerated on the right due to adjacent susceptibility. 7. Otherwise normal MRA circle of Willis without evidence for significant proximal stenosis, aneurysm, or branch vessel occlusion.   Electronically Signed   By: Lawrence Santiago M.D.   On: 07/24/2014 09:03    Scheduled Meds: . azelastine  2 spray Each Nare Daily  . cefTRIAXone (ROCEPHIN)  IV  1 g Intravenous Q24H  . furosemide  20 mg Oral Daily  . insulin aspart  0-15 Units Subcutaneous TID WC  . insulin aspart  0-5 Units Subcutaneous QHS  . irbesartan  150 mg Oral BID  . levothyroxine  125 mcg Oral QAC breakfast  . linagliptin  5 mg Oral Daily  .  metoprolol succinate  50 mg Oral BID  . pantoprazole  40 mg Oral Daily  . rosuvastatin  20 mg Oral Daily  . sodium chloride  3 mL Intravenous Q12H   Continuous Infusions: . sodium chloride      Active Problems:   Subdural hematoma   CVA (cerebral infarction)   CVA (cerebral vascular accident)   Diabetes mellitus   Anxiety   Hypothyroidism   Essential hypertension   Acute left-sided weakness    Time spent: 56min    [facility], [facility] Pager [phone]. If 7PM-7AM, please contact night-coverage at [email or web address], password [facility] 07/25/2014, 11:36 AM  LOS: 2 days
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
## 2014-07-29 LAB — CULTURE, BLOOD (ROUTINE X 2)
Culture: NO GROWTH
Culture: NO GROWTH

## 2014-07-31 DIAGNOSIS — E780 Pure hypercholesterolemia: Secondary | ICD-10-CM | POA: Diagnosis not present

## 2014-07-31 DIAGNOSIS — N390 Urinary tract infection, site not specified: Secondary | ICD-10-CM | POA: Diagnosis not present

## 2014-07-31 DIAGNOSIS — E1165 Type 2 diabetes mellitus with hyperglycemia: Secondary | ICD-10-CM | POA: Diagnosis not present

## 2014-07-31 DIAGNOSIS — M4726 Other spondylosis with radiculopathy, lumbar region: Secondary | ICD-10-CM | POA: Diagnosis not present

## 2014-07-31 DIAGNOSIS — M47816 Spondylosis without myelopathy or radiculopathy, lumbar region: Secondary | ICD-10-CM | POA: Diagnosis not present

## 2014-07-31 DIAGNOSIS — E039 Hypothyroidism, unspecified: Secondary | ICD-10-CM | POA: Diagnosis not present

## 2014-07-31 DIAGNOSIS — G8194 Hemiplegia, unspecified affecting left nondominant side: Secondary | ICD-10-CM | POA: Diagnosis not present

## 2014-07-31 DIAGNOSIS — I6201 Nontraumatic acute subdural hemorrhage: Secondary | ICD-10-CM | POA: Diagnosis not present

## 2014-08-01 DIAGNOSIS — M4316 Spondylolisthesis, lumbar region: Secondary | ICD-10-CM | POA: Diagnosis not present

## 2014-08-01 DIAGNOSIS — R569 Unspecified convulsions: Secondary | ICD-10-CM | POA: Diagnosis not present

## 2014-08-01 DIAGNOSIS — I6200 Nontraumatic subdural hemorrhage, unspecified: Secondary | ICD-10-CM | POA: Diagnosis not present

## 2014-08-01 DIAGNOSIS — Z6833 Body mass index (BMI) 33.0-33.9, adult: Secondary | ICD-10-CM | POA: Diagnosis not present

## 2014-08-01 DIAGNOSIS — I10 Essential (primary) hypertension: Secondary | ICD-10-CM | POA: Diagnosis not present

## 2014-08-01 DIAGNOSIS — M4806 Spinal stenosis, lumbar region: Secondary | ICD-10-CM | POA: Diagnosis not present

## 2014-08-03 DIAGNOSIS — E11319 Type 2 diabetes mellitus with unspecified diabetic retinopathy without macular edema: Secondary | ICD-10-CM | POA: Diagnosis not present

## 2014-08-03 DIAGNOSIS — E11329 Type 2 diabetes mellitus with mild nonproliferative diabetic retinopathy without macular edema: Secondary | ICD-10-CM | POA: Diagnosis not present

## 2014-08-03 DIAGNOSIS — M1990 Unspecified osteoarthritis, unspecified site: Secondary | ICD-10-CM | POA: Diagnosis not present

## 2014-08-03 DIAGNOSIS — E11331 Type 2 diabetes mellitus with moderate nonproliferative diabetic retinopathy with macular edema: Secondary | ICD-10-CM | POA: Diagnosis not present

## 2014-08-03 DIAGNOSIS — S069X0S Unspecified intracranial injury without loss of consciousness, sequela: Secondary | ICD-10-CM | POA: Diagnosis not present

## 2014-08-03 DIAGNOSIS — Z9181 History of falling: Secondary | ICD-10-CM | POA: Diagnosis not present

## 2014-08-03 DIAGNOSIS — M4306 Spondylolysis, lumbar region: Secondary | ICD-10-CM | POA: Diagnosis not present

## 2014-08-03 DIAGNOSIS — R41841 Cognitive communication deficit: Secondary | ICD-10-CM | POA: Diagnosis not present

## 2014-08-03 DIAGNOSIS — G8194 Hemiplegia, unspecified affecting left nondominant side: Secondary | ICD-10-CM | POA: Diagnosis not present

## 2014-08-03 DIAGNOSIS — I10 Essential (primary) hypertension: Secondary | ICD-10-CM | POA: Diagnosis not present

## 2014-08-04 DIAGNOSIS — I10 Essential (primary) hypertension: Secondary | ICD-10-CM | POA: Diagnosis not present

## 2014-08-04 DIAGNOSIS — R41841 Cognitive communication deficit: Secondary | ICD-10-CM | POA: Diagnosis not present

## 2014-08-04 DIAGNOSIS — G8194 Hemiplegia, unspecified affecting left nondominant side: Secondary | ICD-10-CM | POA: Diagnosis not present

## 2014-08-04 DIAGNOSIS — M4306 Spondylolysis, lumbar region: Secondary | ICD-10-CM | POA: Diagnosis not present

## 2014-08-04 DIAGNOSIS — S069X0S Unspecified intracranial injury without loss of consciousness, sequela: Secondary | ICD-10-CM | POA: Diagnosis not present

## 2014-08-04 DIAGNOSIS — E11319 Type 2 diabetes mellitus with unspecified diabetic retinopathy without macular edema: Secondary | ICD-10-CM | POA: Diagnosis not present

## 2014-08-06 DIAGNOSIS — R41841 Cognitive communication deficit: Secondary | ICD-10-CM | POA: Diagnosis not present

## 2014-08-06 DIAGNOSIS — S069X0S Unspecified intracranial injury without loss of consciousness, sequela: Secondary | ICD-10-CM | POA: Diagnosis not present

## 2014-08-06 DIAGNOSIS — E11319 Type 2 diabetes mellitus with unspecified diabetic retinopathy without macular edema: Secondary | ICD-10-CM | POA: Diagnosis not present

## 2014-08-06 DIAGNOSIS — M4306 Spondylolysis, lumbar region: Secondary | ICD-10-CM | POA: Diagnosis not present

## 2014-08-06 DIAGNOSIS — I10 Essential (primary) hypertension: Secondary | ICD-10-CM | POA: Diagnosis not present

## 2014-08-06 DIAGNOSIS — G8194 Hemiplegia, unspecified affecting left nondominant side: Secondary | ICD-10-CM | POA: Diagnosis not present

## 2014-08-07 DIAGNOSIS — I10 Essential (primary) hypertension: Secondary | ICD-10-CM | POA: Diagnosis not present

## 2014-08-07 DIAGNOSIS — E11319 Type 2 diabetes mellitus with unspecified diabetic retinopathy without macular edema: Secondary | ICD-10-CM | POA: Diagnosis not present

## 2014-08-07 DIAGNOSIS — G8194 Hemiplegia, unspecified affecting left nondominant side: Secondary | ICD-10-CM | POA: Diagnosis not present

## 2014-08-07 DIAGNOSIS — R41841 Cognitive communication deficit: Secondary | ICD-10-CM | POA: Diagnosis not present

## 2014-08-07 DIAGNOSIS — S069X0S Unspecified intracranial injury without loss of consciousness, sequela: Secondary | ICD-10-CM | POA: Diagnosis not present

## 2014-08-07 DIAGNOSIS — M4306 Spondylolysis, lumbar region: Secondary | ICD-10-CM | POA: Diagnosis not present

## 2014-08-08 DIAGNOSIS — E11319 Type 2 diabetes mellitus with unspecified diabetic retinopathy without macular edema: Secondary | ICD-10-CM | POA: Diagnosis not present

## 2014-08-08 DIAGNOSIS — M4306 Spondylolysis, lumbar region: Secondary | ICD-10-CM | POA: Diagnosis not present

## 2014-08-08 DIAGNOSIS — S069X0S Unspecified intracranial injury without loss of consciousness, sequela: Secondary | ICD-10-CM | POA: Diagnosis not present

## 2014-08-08 DIAGNOSIS — R41841 Cognitive communication deficit: Secondary | ICD-10-CM | POA: Diagnosis not present

## 2014-08-08 DIAGNOSIS — G8194 Hemiplegia, unspecified affecting left nondominant side: Secondary | ICD-10-CM | POA: Diagnosis not present

## 2014-08-08 DIAGNOSIS — I10 Essential (primary) hypertension: Secondary | ICD-10-CM | POA: Diagnosis not present

## 2014-08-14 DIAGNOSIS — S069X0S Unspecified intracranial injury without loss of consciousness, sequela: Secondary | ICD-10-CM | POA: Diagnosis not present

## 2014-08-14 DIAGNOSIS — I10 Essential (primary) hypertension: Secondary | ICD-10-CM | POA: Diagnosis not present

## 2014-08-14 DIAGNOSIS — G8194 Hemiplegia, unspecified affecting left nondominant side: Secondary | ICD-10-CM | POA: Diagnosis not present

## 2014-08-14 DIAGNOSIS — R41841 Cognitive communication deficit: Secondary | ICD-10-CM | POA: Diagnosis not present

## 2014-08-14 DIAGNOSIS — E11319 Type 2 diabetes mellitus with unspecified diabetic retinopathy without macular edema: Secondary | ICD-10-CM | POA: Diagnosis not present

## 2014-08-14 DIAGNOSIS — M4306 Spondylolysis, lumbar region: Secondary | ICD-10-CM | POA: Diagnosis not present

## 2014-08-15 DIAGNOSIS — G8194 Hemiplegia, unspecified affecting left nondominant side: Secondary | ICD-10-CM | POA: Diagnosis not present

## 2014-08-15 DIAGNOSIS — E11319 Type 2 diabetes mellitus with unspecified diabetic retinopathy without macular edema: Secondary | ICD-10-CM | POA: Diagnosis not present

## 2014-08-15 DIAGNOSIS — R41841 Cognitive communication deficit: Secondary | ICD-10-CM | POA: Diagnosis not present

## 2014-08-15 DIAGNOSIS — S069X0S Unspecified intracranial injury without loss of consciousness, sequela: Secondary | ICD-10-CM | POA: Diagnosis not present

## 2014-08-15 DIAGNOSIS — M4306 Spondylolysis, lumbar region: Secondary | ICD-10-CM | POA: Diagnosis not present

## 2014-08-15 DIAGNOSIS — I10 Essential (primary) hypertension: Secondary | ICD-10-CM | POA: Diagnosis not present

## 2014-08-16 DIAGNOSIS — S069X0S Unspecified intracranial injury without loss of consciousness, sequela: Secondary | ICD-10-CM | POA: Diagnosis not present

## 2014-08-16 DIAGNOSIS — I10 Essential (primary) hypertension: Secondary | ICD-10-CM | POA: Diagnosis not present

## 2014-08-16 DIAGNOSIS — G8194 Hemiplegia, unspecified affecting left nondominant side: Secondary | ICD-10-CM | POA: Diagnosis not present

## 2014-08-16 DIAGNOSIS — M4306 Spondylolysis, lumbar region: Secondary | ICD-10-CM | POA: Diagnosis not present

## 2014-08-16 DIAGNOSIS — R41841 Cognitive communication deficit: Secondary | ICD-10-CM | POA: Diagnosis not present

## 2014-08-16 DIAGNOSIS — E11319 Type 2 diabetes mellitus with unspecified diabetic retinopathy without macular edema: Secondary | ICD-10-CM | POA: Diagnosis not present

## 2014-08-31 DIAGNOSIS — M4806 Spinal stenosis, lumbar region: Secondary | ICD-10-CM | POA: Diagnosis not present

## 2014-08-31 DIAGNOSIS — M545 Low back pain: Secondary | ICD-10-CM | POA: Diagnosis not present

## 2014-08-31 DIAGNOSIS — Z6833 Body mass index (BMI) 33.0-33.9, adult: Secondary | ICD-10-CM | POA: Diagnosis not present

## 2014-08-31 DIAGNOSIS — M4316 Spondylolisthesis, lumbar region: Secondary | ICD-10-CM | POA: Diagnosis not present

## 2014-08-31 DIAGNOSIS — M5416 Radiculopathy, lumbar region: Secondary | ICD-10-CM | POA: Diagnosis not present

## 2014-09-04 DIAGNOSIS — M19011 Primary osteoarthritis, right shoulder: Secondary | ICD-10-CM | POA: Diagnosis not present

## 2014-09-04 DIAGNOSIS — Z96611 Presence of right artificial shoulder joint: Secondary | ICD-10-CM | POA: Diagnosis not present

## 2014-09-04 DIAGNOSIS — M25511 Pain in right shoulder: Secondary | ICD-10-CM | POA: Diagnosis not present

## 2014-09-04 DIAGNOSIS — M6281 Muscle weakness (generalized): Secondary | ICD-10-CM | POA: Diagnosis not present

## 2014-09-10 DIAGNOSIS — M19011 Primary osteoarthritis, right shoulder: Secondary | ICD-10-CM | POA: Diagnosis not present

## 2014-09-10 DIAGNOSIS — Z96611 Presence of right artificial shoulder joint: Secondary | ICD-10-CM | POA: Diagnosis not present

## 2014-09-10 DIAGNOSIS — M6281 Muscle weakness (generalized): Secondary | ICD-10-CM | POA: Diagnosis not present

## 2014-09-10 DIAGNOSIS — M25511 Pain in right shoulder: Secondary | ICD-10-CM | POA: Diagnosis not present

## 2014-09-13 DIAGNOSIS — M19011 Primary osteoarthritis, right shoulder: Secondary | ICD-10-CM | POA: Diagnosis not present

## 2014-09-13 DIAGNOSIS — M6281 Muscle weakness (generalized): Secondary | ICD-10-CM | POA: Diagnosis not present

## 2014-09-13 DIAGNOSIS — M25511 Pain in right shoulder: Secondary | ICD-10-CM | POA: Diagnosis not present

## 2014-09-13 DIAGNOSIS — Z96611 Presence of right artificial shoulder joint: Secondary | ICD-10-CM | POA: Diagnosis not present

## 2014-09-17 DIAGNOSIS — M19011 Primary osteoarthritis, right shoulder: Secondary | ICD-10-CM | POA: Diagnosis not present

## 2014-09-17 DIAGNOSIS — Z96611 Presence of right artificial shoulder joint: Secondary | ICD-10-CM | POA: Diagnosis not present

## 2014-09-17 DIAGNOSIS — M25511 Pain in right shoulder: Secondary | ICD-10-CM | POA: Diagnosis not present

## 2014-09-17 DIAGNOSIS — M6281 Muscle weakness (generalized): Secondary | ICD-10-CM | POA: Diagnosis not present

## 2014-09-20 DIAGNOSIS — M19011 Primary osteoarthritis, right shoulder: Secondary | ICD-10-CM | POA: Diagnosis not present

## 2014-09-20 DIAGNOSIS — Z96611 Presence of right artificial shoulder joint: Secondary | ICD-10-CM | POA: Diagnosis not present

## 2014-09-20 DIAGNOSIS — M6281 Muscle weakness (generalized): Secondary | ICD-10-CM | POA: Diagnosis not present

## 2014-09-20 DIAGNOSIS — M25511 Pain in right shoulder: Secondary | ICD-10-CM | POA: Diagnosis not present

## 2014-09-26 DIAGNOSIS — Z96611 Presence of right artificial shoulder joint: Secondary | ICD-10-CM | POA: Diagnosis not present

## 2014-09-26 DIAGNOSIS — M25511 Pain in right shoulder: Secondary | ICD-10-CM | POA: Diagnosis not present

## 2014-09-26 DIAGNOSIS — M6281 Muscle weakness (generalized): Secondary | ICD-10-CM | POA: Diagnosis not present

## 2014-09-26 DIAGNOSIS — M19011 Primary osteoarthritis, right shoulder: Secondary | ICD-10-CM | POA: Diagnosis not present

## 2014-10-02 DIAGNOSIS — M25511 Pain in right shoulder: Secondary | ICD-10-CM | POA: Diagnosis not present

## 2014-10-02 DIAGNOSIS — M19011 Primary osteoarthritis, right shoulder: Secondary | ICD-10-CM | POA: Diagnosis not present

## 2014-10-02 DIAGNOSIS — L03012 Cellulitis of left finger: Secondary | ICD-10-CM | POA: Diagnosis not present

## 2014-10-02 DIAGNOSIS — M6281 Muscle weakness (generalized): Secondary | ICD-10-CM | POA: Diagnosis not present

## 2014-10-02 DIAGNOSIS — Z96611 Presence of right artificial shoulder joint: Secondary | ICD-10-CM | POA: Diagnosis not present

## 2014-10-04 DIAGNOSIS — M19011 Primary osteoarthritis, right shoulder: Secondary | ICD-10-CM | POA: Diagnosis not present

## 2014-10-04 DIAGNOSIS — Z96611 Presence of right artificial shoulder joint: Secondary | ICD-10-CM | POA: Diagnosis not present

## 2014-10-04 DIAGNOSIS — M6281 Muscle weakness (generalized): Secondary | ICD-10-CM | POA: Diagnosis not present

## 2014-10-04 DIAGNOSIS — M25511 Pain in right shoulder: Secondary | ICD-10-CM | POA: Diagnosis not present

## 2014-10-16 DIAGNOSIS — M9904 Segmental and somatic dysfunction of sacral region: Secondary | ICD-10-CM | POA: Diagnosis not present

## 2014-10-16 DIAGNOSIS — M9905 Segmental and somatic dysfunction of pelvic region: Secondary | ICD-10-CM | POA: Diagnosis not present

## 2014-10-16 DIAGNOSIS — M5136 Other intervertebral disc degeneration, lumbar region: Secondary | ICD-10-CM | POA: Diagnosis not present

## 2014-10-16 DIAGNOSIS — M9903 Segmental and somatic dysfunction of lumbar region: Secondary | ICD-10-CM | POA: Diagnosis not present

## 2014-10-25 DIAGNOSIS — I10 Essential (primary) hypertension: Secondary | ICD-10-CM | POA: Diagnosis not present

## 2014-10-25 DIAGNOSIS — E1165 Type 2 diabetes mellitus with hyperglycemia: Secondary | ICD-10-CM | POA: Diagnosis not present

## 2014-10-25 DIAGNOSIS — E040 Nontoxic diffuse goiter: Secondary | ICD-10-CM | POA: Diagnosis not present

## 2014-10-30 DIAGNOSIS — L931 Subacute cutaneous lupus erythematosus: Secondary | ICD-10-CM | POA: Diagnosis not present

## 2014-10-30 DIAGNOSIS — L80 Vitiligo: Secondary | ICD-10-CM | POA: Diagnosis not present

## 2014-11-30 DIAGNOSIS — E11329 Type 2 diabetes mellitus with mild nonproliferative diabetic retinopathy without macular edema: Secondary | ICD-10-CM | POA: Diagnosis not present

## 2014-11-30 DIAGNOSIS — E11331 Type 2 diabetes mellitus with moderate nonproliferative diabetic retinopathy with macular edema: Secondary | ICD-10-CM | POA: Diagnosis not present

## 2014-12-13 DIAGNOSIS — T300 Burn of unspecified body region, unspecified degree: Secondary | ICD-10-CM | POA: Diagnosis not present

## 2014-12-16 DIAGNOSIS — L03116 Cellulitis of left lower limb: Secondary | ICD-10-CM | POA: Diagnosis not present

## 2014-12-16 DIAGNOSIS — T24202A Burn of second degree of unspecified site of left lower limb, except ankle and foot, initial encounter: Secondary | ICD-10-CM | POA: Diagnosis not present

## 2014-12-19 ENCOUNTER — Encounter (HOSPITAL_COMMUNITY): Payer: Self-pay | Admitting: *Deleted

## 2014-12-19 DIAGNOSIS — E119 Type 2 diabetes mellitus without complications: Secondary | ICD-10-CM | POA: Diagnosis present

## 2014-12-19 DIAGNOSIS — I10 Essential (primary) hypertension: Secondary | ICD-10-CM | POA: Diagnosis not present

## 2014-12-19 DIAGNOSIS — E785 Hyperlipidemia, unspecified: Secondary | ICD-10-CM | POA: Diagnosis present

## 2014-12-19 DIAGNOSIS — M1990 Unspecified osteoarthritis, unspecified site: Secondary | ICD-10-CM | POA: Diagnosis present

## 2014-12-19 DIAGNOSIS — F329 Major depressive disorder, single episode, unspecified: Secondary | ICD-10-CM | POA: Diagnosis not present

## 2014-12-19 DIAGNOSIS — L03116 Cellulitis of left lower limb: Principal | ICD-10-CM | POA: Diagnosis present

## 2014-12-19 DIAGNOSIS — K759 Inflammatory liver disease, unspecified: Secondary | ICD-10-CM | POA: Diagnosis not present

## 2014-12-19 DIAGNOSIS — L80 Vitiligo: Secondary | ICD-10-CM | POA: Diagnosis present

## 2014-12-19 DIAGNOSIS — Z90710 Acquired absence of both cervix and uterus: Secondary | ICD-10-CM

## 2014-12-19 DIAGNOSIS — K589 Irritable bowel syndrome without diarrhea: Secondary | ICD-10-CM | POA: Diagnosis present

## 2014-12-19 DIAGNOSIS — Z8673 Personal history of transient ischemic attack (TIA), and cerebral infarction without residual deficits: Secondary | ICD-10-CM

## 2014-12-19 DIAGNOSIS — D638 Anemia in other chronic diseases classified elsewhere: Secondary | ICD-10-CM | POA: Diagnosis present

## 2014-12-19 DIAGNOSIS — E039 Hypothyroidism, unspecified: Secondary | ICD-10-CM | POA: Diagnosis present

## 2014-12-19 DIAGNOSIS — E780 Pure hypercholesterolemia: Secondary | ICD-10-CM | POA: Diagnosis present

## 2014-12-19 DIAGNOSIS — K219 Gastro-esophageal reflux disease without esophagitis: Secondary | ICD-10-CM | POA: Diagnosis present
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
## 2014-12-20 ENCOUNTER — Encounter (HOSPITAL_COMMUNITY): Payer: Self-pay | Admitting: Internal Medicine

## 2014-12-20 ENCOUNTER — Inpatient Hospital Stay (HOSPITAL_COMMUNITY)
Admission: EM | Admit: 2014-12-20 | Discharge: 2014-12-22 | DRG: 603 | Disposition: A | Discharge status: Home / Self Care | Payer: Medicare Other | Attending: Internal Medicine | Admitting: Internal Medicine

## 2014-12-20 DIAGNOSIS — L0390 Cellulitis, unspecified: Secondary | ICD-10-CM | POA: Diagnosis present

## 2014-12-20 DIAGNOSIS — I10 Essential (primary) hypertension: Secondary | ICD-10-CM | POA: Diagnosis not present

## 2014-12-20 DIAGNOSIS — K589 Irritable bowel syndrome without diarrhea: Secondary | ICD-10-CM | POA: Diagnosis present

## 2014-12-20 DIAGNOSIS — L03116 Cellulitis of left lower limb: Secondary | ICD-10-CM | POA: Diagnosis not present

## 2014-12-20 DIAGNOSIS — Z90710 Acquired absence of both cervix and uterus: Secondary | ICD-10-CM | POA: Diagnosis not present

## 2014-12-20 DIAGNOSIS — D638 Anemia in other chronic diseases classified elsewhere: Secondary | ICD-10-CM | POA: Diagnosis present

## 2014-12-20 DIAGNOSIS — E039 Hypothyroidism, unspecified: Secondary | ICD-10-CM | POA: Diagnosis present

## 2014-12-20 DIAGNOSIS — K219 Gastro-esophageal reflux disease without esophagitis: Secondary | ICD-10-CM | POA: Diagnosis present

## 2014-12-20 DIAGNOSIS — E119 Type 2 diabetes mellitus without complications: Secondary | ICD-10-CM | POA: Diagnosis not present

## 2014-12-20 DIAGNOSIS — M1990 Unspecified osteoarthritis, unspecified site: Secondary | ICD-10-CM | POA: Diagnosis present

## 2014-12-20 DIAGNOSIS — Z8673 Personal history of transient ischemic attack (TIA), and cerebral infarction without residual deficits: Secondary | ICD-10-CM | POA: Diagnosis not present

## 2014-12-20 DIAGNOSIS — E785 Hyperlipidemia, unspecified: Secondary | ICD-10-CM | POA: Diagnosis present

## 2014-12-20 DIAGNOSIS — T24332A Burn of third degree of left lower leg, initial encounter: Secondary | ICD-10-CM | POA: Diagnosis not present

## 2014-12-20 DIAGNOSIS — L80 Vitiligo: Secondary | ICD-10-CM | POA: Diagnosis present

## 2014-12-20 DIAGNOSIS — F329 Major depressive disorder, single episode, unspecified: Secondary | ICD-10-CM | POA: Diagnosis present

## 2014-12-20 DIAGNOSIS — K759 Inflammatory liver disease, unspecified: Secondary | ICD-10-CM | POA: Diagnosis present

## 2014-12-20 DIAGNOSIS — E780 Pure hypercholesterolemia: Secondary | ICD-10-CM | POA: Diagnosis present

## 2014-12-20 LAB — CBC WITH DIFFERENTIAL/PLATELET
Basophils Absolute: 0 10*3/uL (ref 0.0–0.1)
Basophils Absolute: 0 10*3/uL (ref 0.0–0.1)
Basophils Relative: 0 % (ref 0–1)
Basophils Relative: 1 % (ref 0–1)
Eosinophils Absolute: 0.2 10*3/uL (ref 0.0–0.7)
Eosinophils Absolute: 0.2 10*3/uL (ref 0.0–0.7)
Eosinophils Relative: 3 % (ref 0–5)
Eosinophils Relative: 4 % (ref 0–5)
HCT: 34.2 % — ABNORMAL LOW (ref 36.0–46.0)
HCT: 36 % (ref 36.0–46.0)
Hemoglobin: 11.3 g/dL — ABNORMAL LOW (ref 12.0–15.0)
Hemoglobin: 11.8 g/dL — ABNORMAL LOW (ref 12.0–15.0)
Lymphocytes Relative: 19 % (ref 12–46)
Lymphocytes Relative: 28 % (ref 12–46)
Lymphs Abs: 1 10*3/uL (ref 0.7–4.0)
Lymphs Abs: 1.4 10*3/uL (ref 0.7–4.0)
MCH: 28.6 pg (ref 26.0–34.0)
MCH: 28.9 pg (ref 26.0–34.0)
MCHC: 32.8 g/dL (ref 30.0–36.0)
MCHC: 33 g/dL (ref 30.0–36.0)
MCV: 87.2 fL (ref 78.0–100.0)
MCV: 87.5 fL (ref 78.0–100.0)
Monocytes Absolute: 0.6 10*3/uL (ref 0.1–1.0)
Monocytes Absolute: 0.7 10*3/uL (ref 0.1–1.0)
Monocytes Relative: 12 % (ref 3–12)
Monocytes Relative: 13 % — ABNORMAL HIGH (ref 3–12)
Neutro Abs: 2.9 10*3/uL (ref 1.7–7.7)
Neutro Abs: 3.4 10*3/uL (ref 1.7–7.7)
Neutrophils Relative %: 56 % (ref 43–77)
Neutrophils Relative %: 64 % (ref 43–77)
Platelets: 185 10*3/uL (ref 150–400)
Platelets: 188 10*3/uL (ref 150–400)
RBC: 3.91 MIL/uL (ref 3.87–5.11)
RBC: 4.13 MIL/uL (ref 3.87–5.11)
RDW: 13.4 % (ref 11.5–15.5)
RDW: 13.7 % (ref 11.5–15.5)
WBC: 5.2 10*3/uL (ref 4.0–10.5)
WBC: 5.3 10*3/uL (ref 4.0–10.5)

## 2014-12-20 LAB — COMPREHENSIVE METABOLIC PANEL
ALT: 17 U/L (ref 14–54)
AST: 29 U/L (ref 15–41)
Albumin: 3.2 g/dL — ABNORMAL LOW (ref 3.5–5.0)
Alkaline Phosphatase: 72 U/L (ref 38–126)
Anion gap: 11 (ref 5–15)
BUN: 11 mg/dL (ref 6–20)
CO2: 27 mmol/L (ref 22–32)
Calcium: 10 mg/dL (ref 8.9–10.3)
Chloride: 98 mmol/L — ABNORMAL LOW (ref 101–111)
Creatinine, Ser: 0.6 mg/dL (ref 0.44–1.00)
GFR calc Af Amer: >60 mL/min (ref >60)
GFR calc non Af Amer: >60 mL/min (ref >60)
Glucose, Bld: 149 mg/dL — ABNORMAL HIGH (ref 70–99)
Potassium: 4.2 mmol/L (ref 3.5–5.1)
Sodium: 136 mmol/L (ref 135–145)
Total Bilirubin: 0.5 mg/dL (ref 0.3–1.2)
Total Protein: 7.7 g/dL (ref 6.5–8.1)

## 2014-12-20 LAB — BASIC METABOLIC PANEL
Anion gap: 9 (ref 5–15)
BUN: 11 mg/dL (ref 6–20)
CO2: 28 mmol/L (ref 22–32)
Calcium: 9.7 mg/dL (ref 8.9–10.3)
Chloride: 99 mmol/L — ABNORMAL LOW (ref 101–111)
Creatinine, Ser: 0.59 mg/dL (ref 0.44–1.00)
GFR calc Af Amer: >60 mL/min (ref >60)
GFR calc non Af Amer: >60 mL/min (ref >60)
Glucose, Bld: 163 mg/dL — ABNORMAL HIGH (ref 70–99)
Potassium: 4.1 mmol/L (ref 3.5–5.1)
Sodium: 136 mmol/L (ref 135–145)

## 2014-12-20 LAB — GLUCOSE, CAPILLARY
Glucose-Capillary: 168 mg/dL — ABNORMAL HIGH (ref 70–99)
Glucose-Capillary: 203 mg/dL — ABNORMAL HIGH (ref 70–99)
Glucose-Capillary: 166 mg/dL — ABNORMAL HIGH (ref 70–99)
Glucose-Capillary: 134 mg/dL — ABNORMAL HIGH (ref 70–99)

## 2014-12-20 MED ORDER — FUROSEMIDE 20 MG PO TABS
20.0000 mg | ORAL_TABLET | Freq: Every day | ORAL | Status: DC
  Administered 2014-12-20 – 2014-12-22 (×3): 20 mg via ORAL
  Filled 2014-12-20 (×3): qty 1

## 2014-12-20 MED ORDER — ONDANSETRON HCL 4 MG PO TABS: 4.0000 mg | ORAL_TABLET | Freq: Four times a day (QID) | ORAL | Status: DC | PRN

## 2014-12-20 MED ORDER — VANCOMYCIN HCL IN DEXTROSE 1-5 GM/200ML-% IV SOLN
1000.0000 mg | Freq: Once | INTRAVENOUS | Status: AC
  Administered 2014-12-20: 1000 mg via INTRAVENOUS
  Filled 2014-12-20: qty 200

## 2014-12-20 MED ORDER — ALPRAZOLAM 0.5 MG PO TABS
1.0000 mg | ORAL_TABLET | Freq: Every evening | ORAL | Status: DC | PRN
  Administered 2014-12-20 – 2014-12-21 (×3): 1 mg via ORAL
  Filled 2014-12-20 (×3): qty 2

## 2014-12-20 MED ORDER — ACETAMINOPHEN 650 MG RE SUPP: 650.0000 mg | Freq: Four times a day (QID) | RECTAL | Status: DC | PRN

## 2014-12-20 MED ORDER — DOXAZOSIN MESYLATE 4 MG PO TABS
4.0000 mg | ORAL_TABLET | Freq: Every day | ORAL | Status: DC
  Administered 2014-12-20 – 2014-12-21 (×2): 4 mg via ORAL
  Filled 2014-12-20 (×3): qty 1

## 2014-12-20 MED ORDER — SILVER SULFADIAZINE 1 % EX CREA
1.0000 "application " | TOPICAL_CREAM | Freq: Two times a day (BID) | CUTANEOUS | Status: DC
  Administered 2014-12-20 – 2014-12-22 (×4): 1 via TOPICAL
  Filled 2014-12-20: qty 85

## 2014-12-20 MED ORDER — AMLODIPINE BESYLATE 10 MG PO TABS
10.0000 mg | ORAL_TABLET | Freq: Every day | ORAL | Status: DC
  Administered 2014-12-20 – 2014-12-22 (×3): 10 mg via ORAL
  Filled 2014-12-20 (×3): qty 1

## 2014-12-20 MED ORDER — ASPIRIN EC 81 MG PO TBEC
81.0000 mg | DELAYED_RELEASE_TABLET | Freq: Every day | ORAL | Status: DC
  Administered 2014-12-20 – 2014-12-22 (×3): 81 mg via ORAL
  Filled 2014-12-20 (×3): qty 1

## 2014-12-20 MED ORDER — LEVETIRACETAM 500 MG PO TABS
500.0000 mg | ORAL_TABLET | Freq: Two times a day (BID) | ORAL | Status: DC
  Administered 2014-12-20 – 2014-12-22 (×5): 500 mg via ORAL
  Filled 2014-12-20 (×6): qty 1

## 2014-12-20 MED ORDER — PANTOPRAZOLE SODIUM 40 MG PO TBEC
40.0000 mg | DELAYED_RELEASE_TABLET | Freq: Every day | ORAL | Status: DC
  Administered 2014-12-20 – 2014-12-22 (×3): 40 mg via ORAL
  Filled 2014-12-20 (×3): qty 1

## 2014-12-20 MED ORDER — ACETAMINOPHEN 325 MG PO TABS: 650.0000 mg | ORAL_TABLET | Freq: Four times a day (QID) | ORAL | Status: DC | PRN

## 2014-12-20 MED ORDER — PIPERACILLIN-TAZOBACTAM 3.375 G IVPB 30 MIN
3.3750 g | Freq: Once | INTRAVENOUS | Status: AC
  Administered 2014-12-20: 3.375 g via INTRAVENOUS
  Filled 2014-12-20: qty 50

## 2014-12-20 MED ORDER — DOXAZOSIN MESYLATE 2 MG PO TABS
2.0000 mg | ORAL_TABLET | Freq: Every day | ORAL | Status: DC
  Administered 2014-12-20 – 2014-12-22 (×3): 2 mg via ORAL
  Filled 2014-12-20 (×3): qty 1

## 2014-12-20 MED ORDER — LINAGLIPTIN 5 MG PO TABS
5.0000 mg | ORAL_TABLET | Freq: Every day | ORAL | Status: DC
  Administered 2014-12-20 – 2014-12-22 (×3): 5 mg via ORAL
  Filled 2014-12-20 (×3): qty 1

## 2014-12-20 MED ORDER — ENOXAPARIN SODIUM 40 MG/0.4ML ~~LOC~~ SOLN
40.0000 mg | Freq: Every day | SUBCUTANEOUS | Status: DC
Start: 2014-12-20 — End: 2014-12-22
  Administered 2014-12-20 – 2014-12-22 (×3): 40 mg via SUBCUTANEOUS
  Filled 2014-12-20 (×3): qty 0.4

## 2014-12-20 MED ORDER — MORPHINE SULFATE 2 MG/ML IJ SOLN: 1.0000 mg | INTRAMUSCULAR | Status: DC | PRN

## 2014-12-20 MED ORDER — LEVOTHYROXINE SODIUM 125 MCG PO TABS
125.0000 ug | ORAL_TABLET | Freq: Every day | ORAL | Status: DC
  Administered 2014-12-20 – 2014-12-22 (×3): 125 ug via ORAL
  Filled 2014-12-20 (×4): qty 1

## 2014-12-20 MED ORDER — AZELASTINE HCL 0.1 % NA SOLN
2.0000 | Freq: Every day | NASAL | Status: DC
  Administered 2014-12-20 – 2014-12-21 (×2): 2 via NASAL
  Filled 2014-12-20 (×2): qty 30

## 2014-12-20 MED ORDER — METOPROLOL SUCCINATE ER 50 MG PO TB24
50.0000 mg | ORAL_TABLET | Freq: Two times a day (BID) | ORAL | Status: DC
  Administered 2014-12-20 – 2014-12-22 (×5): 50 mg via ORAL
  Filled 2014-12-20 (×6): qty 1

## 2014-12-20 MED ORDER — PIPERACILLIN-TAZOBACTAM 3.375 G IVPB
3.3750 g | Freq: Three times a day (TID) | INTRAVENOUS | Status: DC
  Administered 2014-12-20 – 2014-12-22 (×7): 3.375 g via INTRAVENOUS
  Filled 2014-12-20 (×11): qty 50

## 2014-12-20 MED ORDER — VANCOMYCIN HCL IN DEXTROSE 1-5 GM/200ML-% IV SOLN
1000.0000 mg | INTRAVENOUS | Status: DC
  Administered 2014-12-20: 1000 mg via INTRAVENOUS
  Filled 2014-12-20 (×2): qty 200

## 2014-12-20 MED ORDER — SODIUM CHLORIDE 0.9 % IJ SOLN
3.0000 mL | Freq: Two times a day (BID) | INTRAMUSCULAR | Status: DC
  Administered 2014-12-20 – 2014-12-22 (×3): 3 mL via INTRAVENOUS

## 2014-12-20 MED ORDER — IRBESARTAN 150 MG PO TABS
150.0000 mg | ORAL_TABLET | Freq: Two times a day (BID) | ORAL | Status: DC
  Administered 2014-12-20 – 2014-12-22 (×5): 150 mg via ORAL
  Filled 2014-12-20 (×6): qty 1

## 2014-12-20 MED ORDER — MORPHINE SULFATE 4 MG/ML IJ SOLN
4.0000 mg | Freq: Once | INTRAMUSCULAR | Status: AC
  Administered 2014-12-20: 4 mg via INTRAVENOUS
  Filled 2014-12-20: qty 1

## 2014-12-20 MED ORDER — INSULIN ASPART 100 UNIT/ML ~~LOC~~ SOLN
0.0000 [IU] | Freq: Three times a day (TID) | SUBCUTANEOUS | Status: DC
  Administered 2014-12-20: 2 [IU] via SUBCUTANEOUS
  Administered 2014-12-20: 3 [IU] via SUBCUTANEOUS
  Administered 2014-12-20 – 2014-12-21 (×3): 2 [IU] via SUBCUTANEOUS
  Administered 2014-12-21 – 2014-12-22 (×2): 3 [IU] via SUBCUTANEOUS
  Administered 2014-12-22: 5 [IU] via SUBCUTANEOUS

## 2014-12-20 MED ORDER — DOXAZOSIN MESYLATE 2 MG PO TABS: 2.0000 mg | ORAL_TABLET | Freq: Two times a day (BID) | ORAL | Status: DC

## 2014-12-20 MED ORDER — ONDANSETRON HCL 4 MG/2ML IJ SOLN: 4.0000 mg | Freq: Four times a day (QID) | INTRAMUSCULAR | Status: DC | PRN

## 2014-12-20 MED ORDER — HYDROCODONE-ACETAMINOPHEN 5-325 MG PO TABS
1.0000 | ORAL_TABLET | ORAL | Status: DC | PRN
  Administered 2014-12-20 – 2014-12-22 (×7): 1 via ORAL
  Filled 2014-12-20 (×7): qty 1

## 2014-12-20 MED ORDER — ROSUVASTATIN CALCIUM 20 MG PO TABS
20.0000 mg | ORAL_TABLET | Freq: Every day | ORAL | Status: DC
  Administered 2014-12-20 – 2014-12-22 (×3): 20 mg via ORAL
  Filled 2014-12-20 (×3): qty 1
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
## 2014-12-20 NOTE — Progress Notes (Signed)
Midlevel paged and informed of pt wanting anxiety medications. Xanax 1 mg given. Pt states she is going to bed and she needs medication.

## 2014-12-20 NOTE — Progress Notes (Signed)
Patient seen and evaluated earlier this AM by my associate. Wound care nurse has evaluated and requested that plastic surgery evaluate wound.    PE Gen: pt in nad, alert and awake CV: no cyanosis Pulm: no increased wob, no wheezes  Pain tolerable on pain medication regimen.  Will reassess next am unless there is acute medical problem requiring reevaluation  Gwendola Hornaday, Linward Foster

## 2014-12-20 NOTE — ED Notes (Signed)
Hospitalist just walked in to see patient.
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
## 2014-12-20 NOTE — ED Notes (Signed)
Made pt aware of bed assignment 
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
## 2014-12-20 NOTE — Progress Notes (Signed)
Utilization review complete. Ebin Palazzi RN CCM Case Mgmt phone [phone] 
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
## 2014-12-21 LAB — GLUCOSE, CAPILLARY
Glucose-Capillary: 162 mg/dL — ABNORMAL HIGH (ref 70–99)
Glucose-Capillary: 202 mg/dL — ABNORMAL HIGH (ref 70–99)
Glucose-Capillary: 179 mg/dL — ABNORMAL HIGH (ref 70–99)
Glucose-Capillary: 205 mg/dL — ABNORMAL HIGH (ref 70–99)

## 2014-12-21 MED ORDER — VANCOMYCIN HCL IN DEXTROSE 750-5 MG/150ML-% IV SOLN
750.0000 mg | Freq: Two times a day (BID) | INTRAVENOUS | Status: DC
  Administered 2014-12-21 – 2014-12-22 (×3): 750 mg via INTRAVENOUS
  Filled 2014-12-21 (×4): qty 150
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
## 2014-12-22 LAB — GLUCOSE, CAPILLARY
Glucose-Capillary: 204 mg/dL — ABNORMAL HIGH (ref 70–99)
Glucose-Capillary: 253 mg/dL — ABNORMAL HIGH (ref 70–99)

## 2014-12-22 LAB — BASIC METABOLIC PANEL
Anion gap: 9 (ref 5–15)
BUN: 8 mg/dL (ref 6–20)
CO2: 26 mmol/L (ref 22–32)
Calcium: 9.3 mg/dL (ref 8.9–10.3)
Chloride: 99 mmol/L — ABNORMAL LOW (ref 101–111)
Creatinine, Ser: 0.52 mg/dL (ref 0.44–1.00)
GFR calc Af Amer: >60 mL/min (ref >60)
GFR calc non Af Amer: >60 mL/min (ref >60)
Glucose, Bld: 178 mg/dL — ABNORMAL HIGH (ref 70–99)
Potassium: 3.7 mmol/L (ref 3.5–5.1)
Sodium: 134 mmol/L — ABNORMAL LOW (ref 135–145)

## 2014-12-22 LAB — CBC
HCT: 35 % — ABNORMAL LOW (ref 36.0–46.0)
Hemoglobin: 11.8 g/dL — ABNORMAL LOW (ref 12.0–15.0)
MCH: 29.4 pg (ref 26.0–34.0)
MCHC: 33.7 g/dL (ref 30.0–36.0)
MCV: 87.3 fL (ref 78.0–100.0)
Platelets: 216 10*3/uL (ref 150–400)
RBC: 4.01 MIL/uL (ref 3.87–5.11)
RDW: 13.5 % (ref 11.5–15.5)
WBC: 4.8 10*3/uL (ref 4.0–10.5)

## 2014-12-22 MED ORDER — AMOXICILLIN-POT CLAVULANATE 875-125 MG PO TABS: 1.0000 | ORAL_TABLET | Freq: Two times a day (BID) | ORAL | Status: DC

## 2014-12-22 MED ORDER — DOXYCYCLINE MONOHYDRATE 100 MG PO TABS: 100.0000 mg | ORAL_TABLET | Freq: Two times a day (BID) | ORAL | Status: DC

## 2014-12-22 NOTE — Progress Notes (Signed)
Patient Discharge:  Disposition: Pt discharged home to self  Education: Pt educated on medications, follow up appointments, wound care and all discharge instructions. Pt verbalized understanding  IV: Removed  Telemetry: N/A  Follow-up appointments: Reviewed with pt.  Prescriptions: Scripts sent to pt's pharmacy  Transportation: Transported home by daughter  Belongings: All belongings taken with pt.
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
## 2014-12-23 NOTE — Care Management Note (Addendum)
Case Management Note  Patient Details  Name: Hailey Cobb MRN: [id number] Date of Birth: 03-19-1932  Subjective/Objective:                    Action/Plan: Cm spoke with RN, Carlyn Reichert as pt is seen by Sanger, MD at [facility] and has an appt this week.  RN states pt's daughter is good with dressing changes and will follow with Sanger, MD.  No other CM needs were communicated.    Expected Discharge Date:                  Expected Discharge Plan:  [place]  In-House Referral:     Discharge planning Services  CM Consult  Post Acute Care Choice:    Choice offered to:     DME Arranged:    DME Agency:     HH Arranged:  RN [place] Agency:     Status of Service:     Medicare Important Message Given:    Date Medicare IM Given:    Medicare IM give by:    Date Additional Medicare IM Given:    Additional Medicare Important Message give by:     If discussed at [place] of Stay Meetings, dates discussed:    Additional Comments: Cm spoke with RN, Carlyn Reichert as pt is seen by Sanger, MD at [facility] and has an appt this week. Because pt is not house bound (seeing Sanger this week), insurance will not pay for [facility] for wound care per [facility] rep.   RN states pt's daughter is good with dressing changes and will follow with Sanger, MD 5/12.  No other CM needs were communicated.    Dellie Catholic, RN 12/23/2014, 4:33 PM

## 2014-12-25 ENCOUNTER — Encounter (HOSPITAL_BASED_OUTPATIENT_CLINIC_OR_DEPARTMENT_OTHER): Payer: Self-pay | Admitting: *Deleted
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
## 2014-12-26 ENCOUNTER — Encounter (HOSPITAL_BASED_OUTPATIENT_CLINIC_OR_DEPARTMENT_OTHER)

## 2014-12-26 NOTE — Anesthesia Preprocedure Evaluation (Signed)
Anesthesia Evaluation  Patient identified by MRN, date of birth, ID band Patient awake    Reviewed: Allergy & Precautions, NPO status , Patient's Chart, lab work & pertinent test results  Airway Mallampati: II  TM Distance: >3 FB Neck ROM: Full    Dental no notable dental hx.    Pulmonary neg pulmonary ROS,  breath sounds clear to auscultation  Pulmonary exam normal       Cardiovascular hypertension, Pt. on medications Normal cardiovascular examRhythm:Regular Rate:Normal     Neuro/Psych CVA negative psych ROS   GI/Hepatic Neg liver ROS, GERD-  Medicated,  Endo/Other  diabetesHypothyroidism   Renal/GU negative Renal ROS  negative genitourinary   Musculoskeletal negative musculoskeletal ROS (+)   Abdominal   Peds negative pediatric ROS (+)  Hematology negative hematology ROS (+)   Anesthesia Other Findings   Reproductive/Obstetrics negative OB ROS                             Anesthesia Physical Anesthesia Plan  ASA: III  Anesthesia Plan: General   Post-op Pain Management:    Induction: Intravenous  Airway Management Planned: LMA  Additional Equipment:   Intra-op Plan:   Post-operative Plan: Extubation in OR  Informed Consent: I have reviewed the patients History and Physical, chart, labs and discussed the procedure including the risks, benefits and alternatives for the proposed anesthesia with the patient or authorized representative who has indicated his/her understanding and acceptance.   Dental advisory given  Plan Discussed with: CRNA and Surgeon  Anesthesia Plan Comments:         Anesthesia Quick Evaluation

## 2014-12-27 ENCOUNTER — Other Ambulatory Visit: Payer: Self-pay | Admitting: Plastic Surgery

## 2014-12-27 ENCOUNTER — Ambulatory Visit (HOSPITAL_BASED_OUTPATIENT_CLINIC_OR_DEPARTMENT_OTHER): Payer: Medicare Other | Admitting: Anesthesiology

## 2014-12-27 ENCOUNTER — Encounter (HOSPITAL_BASED_OUTPATIENT_CLINIC_OR_DEPARTMENT_OTHER): Payer: Self-pay

## 2014-12-27 ENCOUNTER — Encounter (HOSPITAL_BASED_OUTPATIENT_CLINIC_OR_DEPARTMENT_OTHER)
Admission: RE | Disposition: A | Discharge status: Home / Self Care | Payer: Self-pay | Source: Ambulatory Visit | Attending: Plastic Surgery

## 2014-12-27 ENCOUNTER — Ambulatory Visit (HOSPITAL_BASED_OUTPATIENT_CLINIC_OR_DEPARTMENT_OTHER)
Admission: RE | Admit: 2014-12-27 | Discharge: 2014-12-27 | Disposition: A | Discharge status: Home / Self Care | Payer: Medicare Other | Source: Ambulatory Visit | Attending: Plastic Surgery | Admitting: Plastic Surgery

## 2014-12-27 DIAGNOSIS — Z7982 Long term (current) use of aspirin: Secondary | ICD-10-CM | POA: Insufficient documentation

## 2014-12-27 DIAGNOSIS — X088XXA Exposure to other specified smoke, fire and flames, initial encounter: Secondary | ICD-10-CM | POA: Insufficient documentation

## 2014-12-27 DIAGNOSIS — Z9049 Acquired absence of other specified parts of digestive tract: Secondary | ICD-10-CM | POA: Insufficient documentation

## 2014-12-27 DIAGNOSIS — E039 Hypothyroidism, unspecified: Secondary | ICD-10-CM | POA: Insufficient documentation

## 2014-12-27 DIAGNOSIS — T25322A Burn of third degree of left foot, initial encounter: Secondary | ICD-10-CM | POA: Insufficient documentation

## 2014-12-27 DIAGNOSIS — E780 Pure hypercholesterolemia: Secondary | ICD-10-CM | POA: Insufficient documentation

## 2014-12-27 DIAGNOSIS — Z96619 Presence of unspecified artificial shoulder joint: Secondary | ICD-10-CM | POA: Insufficient documentation

## 2014-12-27 DIAGNOSIS — Z8673 Personal history of transient ischemic attack (TIA), and cerebral infarction without residual deficits: Secondary | ICD-10-CM | POA: Insufficient documentation

## 2014-12-27 DIAGNOSIS — T25312A Burn of third degree of left ankle, initial encounter: Secondary | ICD-10-CM | POA: Insufficient documentation

## 2014-12-27 DIAGNOSIS — I10 Essential (primary) hypertension: Secondary | ICD-10-CM | POA: Insufficient documentation

## 2014-12-27 DIAGNOSIS — T24332D Burn of third degree of left lower leg, subsequent encounter: Secondary | ICD-10-CM | POA: Diagnosis not present

## 2014-12-27 DIAGNOSIS — F419 Anxiety disorder, unspecified: Secondary | ICD-10-CM | POA: Insufficient documentation

## 2014-12-27 DIAGNOSIS — K219 Gastro-esophageal reflux disease without esophagitis: Secondary | ICD-10-CM | POA: Insufficient documentation

## 2014-12-27 DIAGNOSIS — T24302A Burn of third degree of unspecified site of left lower limb, except ankle and foot, initial encounter: Secondary | ICD-10-CM | POA: Diagnosis present

## 2014-12-27 HISTORY — PX: APPLICATION OF A-CELL OF EXTREMITY: SHX6303

## 2014-12-27 HISTORY — DX: Unspecified convulsions: R56.9

## 2014-12-27 HISTORY — PX: I&D EXTREMITY: SHX5045

## 2014-12-27 HISTORY — DX: Traumatic subdural hemorrhage with loss of consciousness status unknown, initial encounter: S06.5XAA

## 2014-12-27 HISTORY — DX: Traumatic subdural hemorrhage with loss of consciousness of unspecified duration, initial encounter: S06.5X9A

## 2014-12-27 LAB — POCT HEMOGLOBIN-HEMACUE: Hemoglobin: 12.5 g/dL (ref 12.0–15.0)

## 2014-12-27 LAB — GLUCOSE, CAPILLARY: Glucose-Capillary: 192 mg/dL — ABNORMAL HIGH (ref 65–99)

## 2014-12-27 SURGERY — IRRIGATION AND DEBRIDEMENT EXTREMITY
Anesthesia: General | Site: Ankle | Laterality: Left

## 2014-12-27 MED ORDER — HYDROCODONE-ACETAMINOPHEN 5-325 MG PO TABS: 1.0000 | ORAL_TABLET | Freq: Four times a day (QID) | ORAL | Status: DC | PRN

## 2014-12-27 MED ORDER — DEXAMETHASONE SODIUM PHOSPHATE 4 MG/ML IJ SOLN
INTRAMUSCULAR | Status: DC | PRN
  Administered 2014-12-27: 10 mg via INTRAVENOUS

## 2014-12-27 MED ORDER — FENTANYL CITRATE (PF) 100 MCG/2ML IJ SOLN
INTRAMUSCULAR | Status: AC
  Filled 2014-12-27: qty 2

## 2014-12-27 MED ORDER — LIDOCAINE HCL (CARDIAC) 20 MG/ML IV SOLN
INTRAVENOUS | Status: DC | PRN
  Administered 2014-12-27: 50 mg via INTRAVENOUS

## 2014-12-27 MED ORDER — SODIUM CHLORIDE 0.9 % IR SOLN
Status: DC | PRN
  Administered 2014-12-27: 500 mL

## 2014-12-27 MED ORDER — HYDROCODONE-ACETAMINOPHEN 5-325 MG PO TABS
1.0000 | ORAL_TABLET | Freq: Once | ORAL | Status: AC
  Administered 2014-12-27: 1 via ORAL

## 2014-12-27 MED ORDER — CEFAZOLIN SODIUM-DEXTROSE 2-3 GM-% IV SOLR
INTRAVENOUS | Status: AC
Start: 2014-12-27 — End: 2014-12-27
  Filled 2014-12-27: qty 50

## 2014-12-27 MED ORDER — HYDROCODONE-ACETAMINOPHEN 5-325 MG PO TABS
ORAL_TABLET | ORAL | Status: AC
  Filled 2014-12-27: qty 1

## 2014-12-27 MED ORDER — ONDANSETRON HCL 4 MG/2ML IJ SOLN
INTRAMUSCULAR | Status: DC | PRN
  Administered 2014-12-27: 4 mg via INTRAVENOUS

## 2014-12-27 MED ORDER — FENTANYL CITRATE (PF) 100 MCG/2ML IJ SOLN
INTRAMUSCULAR | Status: DC | PRN
  Administered 2014-12-27: 25 ug via INTRAVENOUS
  Administered 2014-12-27: 50 ug via INTRAVENOUS
  Administered 2014-12-27: 25 ug via INTRAVENOUS

## 2014-12-27 MED ORDER — FENTANYL CITRATE (PF) 100 MCG/2ML IJ SOLN: 50.0000 ug | INTRAMUSCULAR | Status: DC | PRN

## 2014-12-27 MED ORDER — BUPIVACAINE-EPINEPHRINE (PF) 0.25% -1:200000 IJ SOLN
INTRAMUSCULAR | Status: AC
  Filled 2014-12-27: qty 30

## 2014-12-27 MED ORDER — LACTATED RINGERS IV SOLN
INTRAVENOUS | Status: DC
  Administered 2014-12-27 (×2): via INTRAVENOUS

## 2014-12-27 MED ORDER — MIDAZOLAM HCL 2 MG/2ML IJ SOLN
INTRAMUSCULAR | Status: AC
  Filled 2014-12-27: qty 2

## 2014-12-27 MED ORDER — MINERAL OIL LIGHT 100 % EX OIL
TOPICAL_OIL | CUTANEOUS | Status: AC
  Filled 2014-12-27: qty 25

## 2014-12-27 MED ORDER — KETOROLAC TROMETHAMINE 30 MG/ML IJ SOLN: 30.0000 mg | Freq: Once | INTRAMUSCULAR | Status: DC | PRN

## 2014-12-27 MED ORDER — PROPOFOL 10 MG/ML IV BOLUS
INTRAVENOUS | Status: DC | PRN
  Administered 2014-12-27: 150 mg via INTRAVENOUS

## 2014-12-27 MED ORDER — FENTANYL CITRATE (PF) 100 MCG/2ML IJ SOLN
INTRAMUSCULAR | Status: AC
  Filled 2014-12-27: qty 6

## 2014-12-27 MED ORDER — BUPIVACAINE HCL (PF) 0.25 % IJ SOLN
INTRAMUSCULAR | Status: AC
  Filled 2014-12-27: qty 30

## 2014-12-27 MED ORDER — FENTANYL CITRATE (PF) 100 MCG/2ML IJ SOLN
25.0000 ug | INTRAMUSCULAR | Status: DC | PRN
  Administered 2014-12-27 (×2): 25 ug via INTRAVENOUS
  Administered 2014-12-27: 50 ug via INTRAVENOUS

## 2014-12-27 MED ORDER — LIDOCAINE-EPINEPHRINE 1 %-1:100000 IJ SOLN
INTRAMUSCULAR | Status: AC
  Filled 2014-12-27: qty 1

## 2014-12-27 MED ORDER — GLYCOPYRROLATE 0.2 MG/ML IJ SOLN: 0.2000 mg | Freq: Once | INTRAMUSCULAR | Status: DC | PRN

## 2014-12-27 MED ORDER — MIDAZOLAM HCL 2 MG/2ML IJ SOLN: 1.0000 mg | INTRAMUSCULAR | Status: DC | PRN

## 2014-12-27 MED ORDER — PROMETHAZINE HCL 25 MG/ML IJ SOLN: 6.2500 mg | INTRAMUSCULAR | Status: DC | PRN

## 2014-12-27 MED ORDER — LIDOCAINE HCL (PF) 1 % IJ SOLN
INTRAMUSCULAR | Status: AC
  Filled 2014-12-27: qty 30

## 2014-12-27 MED ORDER — SODIUM CHLORIDE 0.9 % IJ SOLN
INTRAMUSCULAR | Status: DC | PRN
  Administered 2014-12-27: 20 mL

## 2014-12-27 SURGICAL SUPPLY — 73 items
BAG DECANTER FOR FLEXI CONT (MISCELLANEOUS) ×2 IMPLANT
BANDAGE ELASTIC 3 VELCRO ST LF (GAUZE/BANDAGES/DRESSINGS) IMPLANT
BANDAGE ELASTIC 4 VELCRO ST LF (GAUZE/BANDAGES/DRESSINGS) ×2 IMPLANT
BANDAGE ELASTIC 6 VELCRO ST LF (GAUZE/BANDAGES/DRESSINGS) IMPLANT
BLADE DERMATOME SS (BLADE) ×2 IMPLANT
BLADE HEX COATED 2.75 (ELECTRODE) ×2 IMPLANT
BLADE SURG 10 STRL SS (BLADE) ×2 IMPLANT
BLADE SURG 15 STRL LF DISP TIS (BLADE) ×1 IMPLANT
BLADE SURG 15 STRL SS (BLADE) ×1
BNDG COHESIVE 4X5 TAN STRL (GAUZE/BANDAGES/DRESSINGS) IMPLANT
BNDG CONFORM 2 STRL LF (GAUZE/BANDAGES/DRESSINGS) IMPLANT
BNDG CONFORM 3 STRL LF (GAUZE/BANDAGES/DRESSINGS) IMPLANT
BNDG GAUZE ELAST 4 BULKY (GAUZE/BANDAGES/DRESSINGS) ×4 IMPLANT
BOOT STEPPER DURA MED (SOFTGOODS) ×2 IMPLANT
CANISTER SUCT 1200ML W/VALVE (MISCELLANEOUS) ×2 IMPLANT
CHLORAPREP W/TINT 26ML (MISCELLANEOUS) IMPLANT
COVER BACK TABLE 60X90IN (DRAPES) ×2 IMPLANT
DECANTER SPIKE VIAL GLASS SM (MISCELLANEOUS) IMPLANT
DRAPE INCISE IOBAN 66X45 STRL (DRAPES) IMPLANT
DRAPE U-SHAPE 76X120 STRL (DRAPES) ×2 IMPLANT
DRSG ADAPTIC 3X8 NADH LF (GAUZE/BANDAGES/DRESSINGS) ×4 IMPLANT
DRSG EMULSION OIL 3X3 NADH (GAUZE/BANDAGES/DRESSINGS) IMPLANT
DRSG PAD ABDOMINAL 8X10 ST (GAUZE/BANDAGES/DRESSINGS) ×4 IMPLANT
ELECT REM PT RETURN 9FT ADLT (ELECTROSURGICAL) ×2
ELECTRODE REM PT RTRN 9FT ADLT (ELECTROSURGICAL) ×1 IMPLANT
GAUZE SPONGE 4X4 12PLY STRL (GAUZE/BANDAGES/DRESSINGS) ×2 IMPLANT
GAUZE XEROFORM 1X8 LF (GAUZE/BANDAGES/DRESSINGS) IMPLANT
GAUZE XEROFORM 5X9 LF (GAUZE/BANDAGES/DRESSINGS) IMPLANT
GLOVE BIO SURGEON STRL SZ 6.5 (GLOVE) ×8 IMPLANT
GLOVE BIO SURGEON STRL SZ8 (GLOVE) IMPLANT
GLOVE SURG SS PI 7.0 STRL IVOR (GLOVE) ×2 IMPLANT
GOWN STRL REUS W/ TWL LRG LVL3 (GOWN DISPOSABLE) ×3 IMPLANT
GOWN STRL REUS W/TWL LRG LVL3 (GOWN DISPOSABLE) ×3
MANIFOLD NEPTUNE II (INSTRUMENTS) IMPLANT
MATRIX SURGICAL PSM 10X15CM (Tissue) ×2 IMPLANT
MICROMATRIX 1000MG (Tissue) ×2 IMPLANT
MICROMATRIX 500MG (Tissue) ×2 IMPLANT
NDL SAFETY ECLIPSE 18X1.5 (NEEDLE) ×1 IMPLANT
NEEDLE HYPO 18GX1.5 SHARP (NEEDLE) ×1
NEEDLE HYPO 25X1 1.5 SAFETY (NEEDLE) IMPLANT
NS IRRIG 1000ML POUR BTL (IV SOLUTION) ×2 IMPLANT
PACK BASIN DAY SURGERY FS (CUSTOM PROCEDURE TRAY) ×2 IMPLANT
PADDING CAST ABS 3INX4YD NS (CAST SUPPLIES)
PADDING CAST ABS 4INX4YD NS (CAST SUPPLIES)
PADDING CAST ABS COTTON 3X4 (CAST SUPPLIES) IMPLANT
PADDING CAST ABS COTTON 4X4 ST (CAST SUPPLIES) IMPLANT
PENCIL BUTTON HOLSTER BLD 10FT (ELECTRODE) ×2 IMPLANT
SHEET MEDIUM DRAPE 40X70 STRL (DRAPES) ×2 IMPLANT
SLEEVE SCD COMPRESS KNEE MED (MISCELLANEOUS) IMPLANT
SOLUTION PARTIC MCRMTRX 1000MG (Tissue) ×1 IMPLANT
SOLUTION PARTIC MCRMTRX 500MG (Tissue) ×1 IMPLANT
SPLINT FIBERGLASS 3X35 (CAST SUPPLIES) IMPLANT
SPLINT FIBERGLASS 4X30 (CAST SUPPLIES) IMPLANT
SPLINT PLASTER CAST XFAST 3X15 (CAST SUPPLIES) IMPLANT
SPLINT PLASTER XTRA FASTSET 3X (CAST SUPPLIES)
SPONGE GAUZE 4X4 12PLY STER LF (GAUZE/BANDAGES/DRESSINGS) IMPLANT
SPONGE LAP 18X18 X RAY DECT (DISPOSABLE) ×2 IMPLANT
STAPLER VISISTAT 35W (STAPLE) ×2 IMPLANT
STOCKINETTE IMPERVIOUS LG (DRAPES) IMPLANT
STRIP CLOSURE SKIN 1/2X4 (GAUZE/BANDAGES/DRESSINGS) IMPLANT
SURGILUBE 2OZ TUBE FLIPTOP (MISCELLANEOUS) ×4 IMPLANT
SUT SILK 3 0 PS 1 (SUTURE) IMPLANT
SUT SILK 4 0 PS 2 (SUTURE) IMPLANT
SUT VIC AB 5-0 PS2 18 (SUTURE) ×6 IMPLANT
SYR BULB IRRIGATION 50ML (SYRINGE) ×2 IMPLANT
SYR CONTROL 10ML LL (SYRINGE) IMPLANT
SYR TB 1ML LL NO SAFETY (SYRINGE) ×2 IMPLANT
TAPE HYPAFIX 6X30 (GAUZE/BANDAGES/DRESSINGS) IMPLANT
TOWEL OR 17X24 6PK STRL BLUE (TOWEL DISPOSABLE) ×4 IMPLANT
TRAY DSU PREP LF (CUSTOM PROCEDURE TRAY) ×2 IMPLANT
TUBE CONNECTING 20X1/4 (TUBING) ×2 IMPLANT
UNDERPAD 30X30 (UNDERPADS AND DIAPERS) ×2 IMPLANT
YANKAUER SUCT BULB TIP NO VENT (SUCTIONS) ×2 IMPLANT
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
## 2014-12-27 NOTE — Discharge Instructions (Signed)
[place] gel to leg daily   Post Anesthesia Home Care Instructions  Activity: Get plenty of rest for the remainder of the day. A responsible adult should stay with you for 24 hours following the procedure.  For the next 24 hours, DO NOT: -Drive a car -Paediatric nurse -Drink alcoholic beverages -Take any medication unless instructed by your physician -Make any legal decisions or sign important papers.  Meals: Start with liquid foods such as gelatin or soup. Progress to regular foods as tolerated. Avoid greasy, spicy, heavy foods. If nausea and/or vomiting occur, drink only clear liquids until the nausea and/or vomiting subsides. Call your physician if vomiting continues.  Special Instructions/Symptoms: Your throat may feel dry or sore from the anesthesia or the breathing tube placed in your throat during surgery. If this causes discomfort, gargle with warm salt water. The discomfort should disappear within 24 hours.  If you had a scopolamine patch placed behind your ear for the management of post- operative nausea and/or vomiting:  1. The medication in the patch is effective for 72 hours, after which it should be removed.  Wrap patch in a tissue and discard in the trash. Wash hands thoroughly with soap and water. 2. You may remove the patch earlier than 72 hours if you experience unpleasant side effects which may include dry mouth, dizziness or visual disturbances. 3. Avoid touching the patch. Wash your hands with soap and water after contact with the patch.
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
## 2014-12-27 NOTE — Anesthesia Procedure Notes (Signed)
Procedure Name: LMA Insertion Date/Time: 12/27/2014 7:39 AM Performed by: Melynda Ripple D Pre-anesthesia Checklist: Patient identified, Emergency Drugs available, Suction available and Patient being monitored Patient Re-evaluated:Patient Re-evaluated prior to inductionOxygen Delivery Method: Circle System Utilized Preoxygenation: Pre-oxygenation with 100% oxygen Intubation Type: IV induction Ventilation: Mask ventilation without difficulty LMA: LMA inserted LMA Size: 4.0 Number of attempts: 1 Airway Equipment and Method: Bite block Placement Confirmation: positive ETCO2 Tube secured with: Tape Dental Injury: Teeth and Oropharynx as per pre-operative assessment
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
## 2014-12-28 ENCOUNTER — Encounter (HOSPITAL_BASED_OUTPATIENT_CLINIC_OR_DEPARTMENT_OTHER): Payer: Self-pay | Admitting: Plastic Surgery

## 2015-01-01 DIAGNOSIS — T25212D Burn of second degree of left ankle, subsequent encounter: Secondary | ICD-10-CM | POA: Diagnosis not present

## 2015-01-01 DIAGNOSIS — T24232D Burn of second degree of left lower leg, subsequent encounter: Secondary | ICD-10-CM | POA: Diagnosis not present

## 2015-01-03 DIAGNOSIS — T25212D Burn of second degree of left ankle, subsequent encounter: Secondary | ICD-10-CM | POA: Diagnosis not present

## 2015-01-08 DIAGNOSIS — T25212D Burn of second degree of left ankle, subsequent encounter: Secondary | ICD-10-CM | POA: Diagnosis not present

## 2015-01-15 DIAGNOSIS — T24332D Burn of third degree of left lower leg, subsequent encounter: Secondary | ICD-10-CM | POA: Diagnosis not present

## 2015-01-15 DIAGNOSIS — M4806 Spinal stenosis, lumbar region: Secondary | ICD-10-CM | POA: Diagnosis not present

## 2015-01-15 DIAGNOSIS — M545 Low back pain: Secondary | ICD-10-CM | POA: Diagnosis not present

## 2015-01-15 DIAGNOSIS — M4316 Spondylolisthesis, lumbar region: Secondary | ICD-10-CM | POA: Diagnosis not present

## 2015-01-15 DIAGNOSIS — I10 Essential (primary) hypertension: Secondary | ICD-10-CM | POA: Diagnosis not present

## 2015-01-15 DIAGNOSIS — T25212D Burn of second degree of left ankle, subsequent encounter: Secondary | ICD-10-CM | POA: Diagnosis not present

## 2015-01-22 DIAGNOSIS — T24332D Burn of third degree of left lower leg, subsequent encounter: Secondary | ICD-10-CM | POA: Diagnosis not present

## 2015-01-22 DIAGNOSIS — T25212D Burn of second degree of left ankle, subsequent encounter: Secondary | ICD-10-CM | POA: Diagnosis not present

## 2015-01-28 ENCOUNTER — Encounter (HOSPITAL_BASED_OUTPATIENT_CLINIC_OR_DEPARTMENT_OTHER): Payer: Medicare Other | Attending: Plastic Surgery

## 2015-01-28 DIAGNOSIS — X062XXD Exposure to ignition of other clothing and apparel, subsequent encounter: Secondary | ICD-10-CM | POA: Insufficient documentation

## 2015-01-28 DIAGNOSIS — Z96611 Presence of right artificial shoulder joint: Secondary | ICD-10-CM | POA: Insufficient documentation

## 2015-01-28 DIAGNOSIS — M1990 Unspecified osteoarthritis, unspecified site: Secondary | ICD-10-CM | POA: Insufficient documentation

## 2015-01-28 DIAGNOSIS — Z96612 Presence of left artificial shoulder joint: Secondary | ICD-10-CM | POA: Diagnosis not present

## 2015-01-28 DIAGNOSIS — T24332D Burn of third degree of left lower leg, subsequent encounter: Secondary | ICD-10-CM | POA: Diagnosis not present

## 2015-01-28 DIAGNOSIS — X030XXD Exposure to flames in controlled fire, not in building or structure, subsequent encounter: Secondary | ICD-10-CM | POA: Diagnosis not present

## 2015-01-28 DIAGNOSIS — Z96652 Presence of left artificial knee joint: Secondary | ICD-10-CM | POA: Diagnosis not present

## 2015-01-28 DIAGNOSIS — E119 Type 2 diabetes mellitus without complications: Secondary | ICD-10-CM | POA: Diagnosis not present

## 2015-01-28 DIAGNOSIS — E039 Hypothyroidism, unspecified: Secondary | ICD-10-CM | POA: Insufficient documentation

## 2015-01-28 DIAGNOSIS — I10 Essential (primary) hypertension: Secondary | ICD-10-CM | POA: Insufficient documentation

## 2015-01-28 DIAGNOSIS — Z79899 Other long term (current) drug therapy: Secondary | ICD-10-CM | POA: Diagnosis not present

## 2015-01-31 DIAGNOSIS — I10 Essential (primary) hypertension: Secondary | ICD-10-CM | POA: Diagnosis not present

## 2015-01-31 DIAGNOSIS — E119 Type 2 diabetes mellitus without complications: Secondary | ICD-10-CM | POA: Diagnosis not present

## 2015-01-31 DIAGNOSIS — F418 Other specified anxiety disorders: Secondary | ICD-10-CM | POA: Diagnosis not present

## 2015-01-31 DIAGNOSIS — Z6833 Body mass index (BMI) 33.0-33.9, adult: Secondary | ICD-10-CM | POA: Diagnosis not present

## 2015-01-31 DIAGNOSIS — M1990 Unspecified osteoarthritis, unspecified site: Secondary | ICD-10-CM | POA: Diagnosis not present

## 2015-01-31 DIAGNOSIS — T24332D Burn of third degree of left lower leg, subsequent encounter: Secondary | ICD-10-CM | POA: Diagnosis not present

## 2015-02-01 DIAGNOSIS — I10 Essential (primary) hypertension: Secondary | ICD-10-CM | POA: Diagnosis not present

## 2015-02-01 DIAGNOSIS — F418 Other specified anxiety disorders: Secondary | ICD-10-CM | POA: Diagnosis not present

## 2015-02-01 DIAGNOSIS — M1990 Unspecified osteoarthritis, unspecified site: Secondary | ICD-10-CM | POA: Diagnosis not present

## 2015-02-01 DIAGNOSIS — Z6833 Body mass index (BMI) 33.0-33.9, adult: Secondary | ICD-10-CM | POA: Diagnosis not present

## 2015-02-01 DIAGNOSIS — T24332D Burn of third degree of left lower leg, subsequent encounter: Secondary | ICD-10-CM | POA: Diagnosis not present

## 2015-02-01 DIAGNOSIS — E119 Type 2 diabetes mellitus without complications: Secondary | ICD-10-CM | POA: Diagnosis not present

## 2015-02-02 DIAGNOSIS — F418 Other specified anxiety disorders: Secondary | ICD-10-CM | POA: Diagnosis not present

## 2015-02-02 DIAGNOSIS — M1990 Unspecified osteoarthritis, unspecified site: Secondary | ICD-10-CM | POA: Diagnosis not present

## 2015-02-02 DIAGNOSIS — E119 Type 2 diabetes mellitus without complications: Secondary | ICD-10-CM | POA: Diagnosis not present

## 2015-02-02 DIAGNOSIS — Z6833 Body mass index (BMI) 33.0-33.9, adult: Secondary | ICD-10-CM | POA: Diagnosis not present

## 2015-02-02 DIAGNOSIS — T24332D Burn of third degree of left lower leg, subsequent encounter: Secondary | ICD-10-CM | POA: Diagnosis not present

## 2015-02-02 DIAGNOSIS — I10 Essential (primary) hypertension: Secondary | ICD-10-CM | POA: Diagnosis not present

## 2015-02-04 DIAGNOSIS — Z6833 Body mass index (BMI) 33.0-33.9, adult: Secondary | ICD-10-CM | POA: Diagnosis not present

## 2015-02-04 DIAGNOSIS — M1990 Unspecified osteoarthritis, unspecified site: Secondary | ICD-10-CM | POA: Diagnosis not present

## 2015-02-04 DIAGNOSIS — E119 Type 2 diabetes mellitus without complications: Secondary | ICD-10-CM | POA: Diagnosis not present

## 2015-02-04 DIAGNOSIS — F418 Other specified anxiety disorders: Secondary | ICD-10-CM | POA: Diagnosis not present

## 2015-02-04 DIAGNOSIS — I10 Essential (primary) hypertension: Secondary | ICD-10-CM | POA: Diagnosis not present

## 2015-02-04 DIAGNOSIS — T24332D Burn of third degree of left lower leg, subsequent encounter: Secondary | ICD-10-CM | POA: Diagnosis not present

## 2015-02-06 DIAGNOSIS — Z6833 Body mass index (BMI) 33.0-33.9, adult: Secondary | ICD-10-CM | POA: Diagnosis not present

## 2015-02-06 DIAGNOSIS — I10 Essential (primary) hypertension: Secondary | ICD-10-CM | POA: Diagnosis not present

## 2015-02-06 DIAGNOSIS — E119 Type 2 diabetes mellitus without complications: Secondary | ICD-10-CM | POA: Diagnosis not present

## 2015-02-06 DIAGNOSIS — M1990 Unspecified osteoarthritis, unspecified site: Secondary | ICD-10-CM | POA: Diagnosis not present

## 2015-02-06 DIAGNOSIS — F418 Other specified anxiety disorders: Secondary | ICD-10-CM | POA: Diagnosis not present

## 2015-02-06 DIAGNOSIS — T24332D Burn of third degree of left lower leg, subsequent encounter: Secondary | ICD-10-CM | POA: Diagnosis not present

## 2015-02-08 DIAGNOSIS — T24332D Burn of third degree of left lower leg, subsequent encounter: Secondary | ICD-10-CM | POA: Diagnosis not present

## 2015-02-08 DIAGNOSIS — Z6833 Body mass index (BMI) 33.0-33.9, adult: Secondary | ICD-10-CM | POA: Diagnosis not present

## 2015-02-08 DIAGNOSIS — F418 Other specified anxiety disorders: Secondary | ICD-10-CM | POA: Diagnosis not present

## 2015-02-08 DIAGNOSIS — I10 Essential (primary) hypertension: Secondary | ICD-10-CM | POA: Diagnosis not present

## 2015-02-08 DIAGNOSIS — M1990 Unspecified osteoarthritis, unspecified site: Secondary | ICD-10-CM | POA: Diagnosis not present

## 2015-02-08 DIAGNOSIS — E119 Type 2 diabetes mellitus without complications: Secondary | ICD-10-CM | POA: Diagnosis not present

## 2015-02-11 DIAGNOSIS — F418 Other specified anxiety disorders: Secondary | ICD-10-CM | POA: Diagnosis not present

## 2015-02-11 DIAGNOSIS — Z6833 Body mass index (BMI) 33.0-33.9, adult: Secondary | ICD-10-CM | POA: Diagnosis not present

## 2015-02-11 DIAGNOSIS — I10 Essential (primary) hypertension: Secondary | ICD-10-CM | POA: Diagnosis not present

## 2015-02-11 DIAGNOSIS — M1990 Unspecified osteoarthritis, unspecified site: Secondary | ICD-10-CM | POA: Diagnosis not present

## 2015-02-11 DIAGNOSIS — E119 Type 2 diabetes mellitus without complications: Secondary | ICD-10-CM | POA: Diagnosis not present

## 2015-02-11 DIAGNOSIS — T24332D Burn of third degree of left lower leg, subsequent encounter: Secondary | ICD-10-CM | POA: Diagnosis not present

## 2015-02-12 ENCOUNTER — Encounter (HOSPITAL_COMMUNITY): Payer: Self-pay

## 2015-02-12 ENCOUNTER — Other Ambulatory Visit (HOSPITAL_COMMUNITY): Payer: Medicare Other

## 2015-02-12 ENCOUNTER — Encounter (HOSPITAL_COMMUNITY)
Admission: RE | Admit: 2015-02-12 | Discharge: 2015-02-12 | Disposition: A | Discharge status: Home / Self Care | Payer: Medicare Other | Source: Ambulatory Visit | Attending: Plastic Surgery | Admitting: Plastic Surgery

## 2015-02-12 DIAGNOSIS — K589 Irritable bowel syndrome without diarrhea: Secondary | ICD-10-CM | POA: Diagnosis not present

## 2015-02-12 DIAGNOSIS — I10 Essential (primary) hypertension: Secondary | ICD-10-CM | POA: Diagnosis not present

## 2015-02-12 DIAGNOSIS — K219 Gastro-esophageal reflux disease without esophagitis: Secondary | ICD-10-CM | POA: Insufficient documentation

## 2015-02-12 DIAGNOSIS — Z01812 Encounter for preprocedural laboratory examination: Secondary | ICD-10-CM | POA: Diagnosis not present

## 2015-02-12 DIAGNOSIS — E780 Pure hypercholesterolemia: Secondary | ICD-10-CM | POA: Insufficient documentation

## 2015-02-12 DIAGNOSIS — E890 Postprocedural hypothyroidism: Secondary | ICD-10-CM | POA: Insufficient documentation

## 2015-02-12 DIAGNOSIS — E119 Type 2 diabetes mellitus without complications: Secondary | ICD-10-CM | POA: Diagnosis not present

## 2015-02-12 HISTORY — DX: Autoimmune hepatitis: K75.4

## 2015-02-12 HISTORY — DX: Other allergy status, other than to drugs and biological substances: Z91.09

## 2015-02-12 LAB — CBC
HCT: 37.3 % (ref 36.0–46.0)
Hemoglobin: 12.2 g/dL (ref 12.0–15.0)
MCH: 28 pg (ref 26.0–34.0)
MCHC: 32.7 g/dL (ref 30.0–36.0)
MCV: 85.7 fL (ref 78.0–100.0)
Platelets: 183 10*3/uL (ref 150–400)
RBC: 4.35 MIL/uL (ref 3.87–5.11)
RDW: 13.9 % (ref 11.5–15.5)
WBC: 4.1 10*3/uL (ref 4.0–10.5)

## 2015-02-12 LAB — COMPREHENSIVE METABOLIC PANEL
ALT: 17 U/L (ref 14–54)
AST: 24 U/L (ref 15–41)
Albumin: 3.5 g/dL (ref 3.5–5.0)
Alkaline Phosphatase: 85 U/L (ref 38–126)
Anion gap: 7 (ref 5–15)
BUN: 9 mg/dL (ref 6–20)
CO2: 28 mmol/L (ref 22–32)
Calcium: 9.6 mg/dL (ref 8.9–10.3)
Chloride: 99 mmol/L — ABNORMAL LOW (ref 101–111)
Creatinine, Ser: 0.55 mg/dL (ref 0.44–1.00)
GFR calc Af Amer: >60 mL/min (ref >60)
GFR calc non Af Amer: >60 mL/min (ref >60)
Glucose, Bld: 144 mg/dL — ABNORMAL HIGH (ref 65–99)
Potassium: 4.2 mmol/L (ref 3.5–5.1)
Sodium: 134 mmol/L — ABNORMAL LOW (ref 135–145)
Total Bilirubin: 0.2 mg/dL — ABNORMAL LOW (ref 0.3–1.2)
Total Protein: 7.5 g/dL (ref 6.5–8.1)

## 2015-02-12 LAB — GLUCOSE, CAPILLARY: Glucose-Capillary: 111 mg/dL — ABNORMAL HIGH (ref 65–99)
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
## 2015-02-12 NOTE — Pre-Procedure Instructions (Signed)
SHARETTA RICCHIO  02/12/2015        Your procedure is scheduled on Wednesday, July 6.  Report to [facility] Admitting at 12:30 P.M.   Call this number if you have problems the morning of surgery:  [phone]   Remember:  Do not eat food or drink liquids after midnight.Tuesday night   Take these medicines the morning of surgery with A SIP OF WATER:  Amlodipine (Norvasc),   Keppra, levothyroxine,  Metoprolol, Alprazolam (Xanax), Hydrocodone if needed for pain   Do not wear jewelry, make-up or nail polish.  Do not wear lotions, powders, or perfumes.    Do not shave 48 hours prior to surgery.     Do not bring valuables to the hospital.  [facility] is not responsible for any belongings or valuables.  Contacts, dentures or bridgework may not be worn into surgery.  Leave your suitcase in the car.  After surgery it may be brought to your room.  For patients admitted to the hospital, discharge time will be determined by your treatment team.  Patients discharged the day of surgery will not be allowed to drive home.   Name and phone number of your driver:    Daughter Roque Cash,  [phone]   Special instructions:  [facility] - Preparing for Surgery  Before surgery, you can play an important role.  Because skin is not sterile, your skin needs to be as free of germs as possible.  You can reduce the number of germs on you skin by washing with CHG (chlorahexidine gluconate) soap before surgery.  CHG is an antiseptic cleaner which kills germs and bonds with the skin to continue killing germs even after washing.  Please DO NOT use if you have an allergy to CHG or antibacterial soaps.  If your skin becomes reddened/irritated stop using the CHG and inform your nurse when you arrive at Short Stay.  Do not shave (including legs and underarms) for at least 48 hours prior to the first CHG shower.  You may shave your face.  Please follow these instructions carefully:   1.   Shower with CHG Soap the night before surgery and the     morning of Surgery.  2.  If you choose to wash your hair, wash your hair first as usual with your    normal shampoo.  3.  After you shampoo, rinse your hair and body thoroughly to remove the   Shampoo.  4.  Use CHG as you would any other liquid soap.  You can apply chg directly  to the skin and wash gently with scrungie or a clean washcloth.  5.  Apply the CHG Soap to your body ONLY FROM THE NECK DOWN.    Do not use on open wounds or open sores.  Avoid contact with your eyes,  ears, mouth and genitals (private parts).  Wash genitals (private parts)   with your normal soap.  6.  Wash thoroughly, paying special attention to the area where your surgery  will be performed.  7.  Thoroughly rinse your body with warm water from the neck down.  8.  DO NOT shower/wash with your normal soap after using and rinsing off   the CHG Soap.  9.  Pat yourself dry with a clean towel.            10.  Wear clean pajamas.            11.  Place clean  sheets on your bed the night of your first shower and do not  sleep with pets.  Day of Surgery  Do not apply any lotions/deoderants the morning of surgery.  Please wear clean clothes to the hospital/surgery center.    Please read over the following fact sheets that you were given. Pain management, Coughing and Deep Breathing, Surgical Site Infection

## 2015-02-12 NOTE — Progress Notes (Signed)
   02/12/15 1342  OBSTRUCTIVE SLEEP APNEA  Have you ever been diagnosed with sleep apnea through a sleep study? No  Do you snore loudly (loud enough to be heard through closed doors)?  1  Do you often feel tired, fatigued, or sleepy during the daytime? 0  Has anyone observed you stop breathing during your sleep? 0  Do you have, or are you being treated for high blood pressure? 1  BMI more than 35 kg/m2? 0  Age over 79 years old? 1  Neck circumference greater than 40 cm/16 inches? 0  Gender: 0

## 2015-02-13 ENCOUNTER — Ambulatory Visit: Payer: Self-pay | Admitting: Physician Assistant

## 2015-02-13 DIAGNOSIS — M1990 Unspecified osteoarthritis, unspecified site: Secondary | ICD-10-CM | POA: Diagnosis not present

## 2015-02-13 DIAGNOSIS — T24332D Burn of third degree of left lower leg, subsequent encounter: Secondary | ICD-10-CM | POA: Diagnosis not present

## 2015-02-13 DIAGNOSIS — I10 Essential (primary) hypertension: Secondary | ICD-10-CM | POA: Diagnosis not present

## 2015-02-13 DIAGNOSIS — Z6833 Body mass index (BMI) 33.0-33.9, adult: Secondary | ICD-10-CM | POA: Diagnosis not present

## 2015-02-13 DIAGNOSIS — F418 Other specified anxiety disorders: Secondary | ICD-10-CM | POA: Diagnosis not present

## 2015-02-13 DIAGNOSIS — E119 Type 2 diabetes mellitus without complications: Secondary | ICD-10-CM | POA: Diagnosis not present

## 2015-02-13 LAB — HEMOGLOBIN A1C
Hgb A1c MFr Bld: 7.5 % — ABNORMAL HIGH (ref 4.8–5.6)
Mean Plasma Glucose: 169 mg/dL

## 2015-02-15 DIAGNOSIS — Z6833 Body mass index (BMI) 33.0-33.9, adult: Secondary | ICD-10-CM | POA: Diagnosis not present

## 2015-02-15 DIAGNOSIS — E119 Type 2 diabetes mellitus without complications: Secondary | ICD-10-CM | POA: Diagnosis not present

## 2015-02-15 DIAGNOSIS — F418 Other specified anxiety disorders: Secondary | ICD-10-CM | POA: Diagnosis not present

## 2015-02-15 DIAGNOSIS — M1990 Unspecified osteoarthritis, unspecified site: Secondary | ICD-10-CM | POA: Diagnosis not present

## 2015-02-15 DIAGNOSIS — I10 Essential (primary) hypertension: Secondary | ICD-10-CM | POA: Diagnosis not present

## 2015-02-15 DIAGNOSIS — T24332D Burn of third degree of left lower leg, subsequent encounter: Secondary | ICD-10-CM | POA: Diagnosis not present

## 2015-02-16 DIAGNOSIS — Z6833 Body mass index (BMI) 33.0-33.9, adult: Secondary | ICD-10-CM | POA: Diagnosis not present

## 2015-02-16 DIAGNOSIS — I10 Essential (primary) hypertension: Secondary | ICD-10-CM | POA: Diagnosis not present

## 2015-02-16 DIAGNOSIS — M1990 Unspecified osteoarthritis, unspecified site: Secondary | ICD-10-CM | POA: Diagnosis not present

## 2015-02-16 DIAGNOSIS — F418 Other specified anxiety disorders: Secondary | ICD-10-CM | POA: Diagnosis not present

## 2015-02-16 DIAGNOSIS — E119 Type 2 diabetes mellitus without complications: Secondary | ICD-10-CM | POA: Diagnosis not present

## 2015-02-16 DIAGNOSIS — T24332D Burn of third degree of left lower leg, subsequent encounter: Secondary | ICD-10-CM | POA: Diagnosis not present

## 2015-02-19 ENCOUNTER — Other Ambulatory Visit: Payer: Self-pay | Admitting: Plastic Surgery

## 2015-02-20 ENCOUNTER — Ambulatory Visit (HOSPITAL_COMMUNITY)
Admission: RE | Admit: 2015-02-20 | Discharge: 2015-02-20 | Disposition: A | Discharge status: Home / Self Care | Payer: Medicare Other | Source: Ambulatory Visit | Attending: Plastic Surgery | Admitting: Plastic Surgery

## 2015-02-20 ENCOUNTER — Ambulatory Visit (HOSPITAL_COMMUNITY): Payer: Medicare Other | Admitting: Vascular Surgery

## 2015-02-20 ENCOUNTER — Ambulatory Visit (HOSPITAL_COMMUNITY): Payer: Medicare Other | Admitting: Anesthesiology

## 2015-02-20 ENCOUNTER — Encounter (HOSPITAL_COMMUNITY): Payer: Self-pay | Admitting: Plastic Surgery

## 2015-02-20 ENCOUNTER — Encounter (HOSPITAL_COMMUNITY)
Admission: RE | Disposition: A | Discharge status: Home / Self Care | Payer: Self-pay | Source: Ambulatory Visit | Attending: Plastic Surgery

## 2015-02-20 DIAGNOSIS — T24332D Burn of third degree of left lower leg, subsequent encounter: Secondary | ICD-10-CM | POA: Diagnosis not present

## 2015-02-20 DIAGNOSIS — Z6833 Body mass index (BMI) 33.0-33.9, adult: Secondary | ICD-10-CM | POA: Insufficient documentation

## 2015-02-20 DIAGNOSIS — X062XXD Exposure to ignition of other clothing and apparel, subsequent encounter: Secondary | ICD-10-CM | POA: Insufficient documentation

## 2015-02-20 DIAGNOSIS — M1990 Unspecified osteoarthritis, unspecified site: Secondary | ICD-10-CM | POA: Insufficient documentation

## 2015-02-20 DIAGNOSIS — T25392D Burn of third degree of multiple sites of left ankle and foot, subsequent encounter: Secondary | ICD-10-CM | POA: Insufficient documentation

## 2015-02-20 DIAGNOSIS — K219 Gastro-esophageal reflux disease without esophagitis: Secondary | ICD-10-CM | POA: Diagnosis not present

## 2015-02-20 DIAGNOSIS — I10 Essential (primary) hypertension: Secondary | ICD-10-CM | POA: Diagnosis not present

## 2015-02-20 DIAGNOSIS — T24302D Burn of third degree of unspecified site of left lower limb, except ankle and foot, subsequent encounter: Secondary | ICD-10-CM | POA: Diagnosis not present

## 2015-02-20 DIAGNOSIS — R569 Unspecified convulsions: Secondary | ICD-10-CM | POA: Diagnosis not present

## 2015-02-20 DIAGNOSIS — E119 Type 2 diabetes mellitus without complications: Secondary | ICD-10-CM | POA: Insufficient documentation

## 2015-02-20 DIAGNOSIS — E780 Pure hypercholesterolemia: Secondary | ICD-10-CM | POA: Diagnosis not present

## 2015-02-20 DIAGNOSIS — E039 Hypothyroidism, unspecified: Secondary | ICD-10-CM | POA: Insufficient documentation

## 2015-02-20 DIAGNOSIS — K589 Irritable bowel syndrome without diarrhea: Secondary | ICD-10-CM | POA: Diagnosis not present

## 2015-02-20 HISTORY — PX: INCISION AND DRAINAGE OF WOUND: SHX1803

## 2015-02-20 LAB — GLUCOSE, CAPILLARY
Glucose-Capillary: 156 mg/dL — ABNORMAL HIGH (ref 65–99)
Glucose-Capillary: 143 mg/dL — ABNORMAL HIGH (ref 65–99)

## 2015-02-20 SURGERY — IRRIGATION AND DEBRIDEMENT WOUND
Anesthesia: General | Site: Leg Lower | Laterality: Left

## 2015-02-20 MED ORDER — LIDOCAINE HCL (CARDIAC) 20 MG/ML IV SOLN
INTRAVENOUS | Status: DC | PRN
  Administered 2015-02-20: 50 mg via INTRAVENOUS

## 2015-02-20 MED ORDER — SODIUM CHLORIDE 0.9 % IR SOLN
Status: DC | PRN
  Administered 2015-02-20: 1000 mL

## 2015-02-20 MED ORDER — BACITRACIN ZINC 500 UNIT/GM EX OINT
TOPICAL_OINTMENT | CUTANEOUS | Status: AC
  Filled 2015-02-20: qty 28.35

## 2015-02-20 MED ORDER — BACITRACIN ZINC 500 UNIT/GM EX OINT
TOPICAL_OINTMENT | CUTANEOUS | Status: DC | PRN
  Administered 2015-02-20: 1 via TOPICAL

## 2015-02-20 MED ORDER — FENTANYL CITRATE (PF) 250 MCG/5ML IJ SOLN
INTRAMUSCULAR | Status: AC
  Filled 2015-02-20: qty 5

## 2015-02-20 MED ORDER — ONDANSETRON HCL 4 MG/2ML IJ SOLN
INTRAMUSCULAR | Status: DC | PRN
  Administered 2015-02-20: 4 mg via INTRAVENOUS

## 2015-02-20 MED ORDER — PROMETHAZINE HCL 25 MG/ML IJ SOLN: 6.2500 mg | INTRAMUSCULAR | Status: DC | PRN

## 2015-02-20 MED ORDER — PROPOFOL 10 MG/ML IV BOLUS
INTRAVENOUS | Status: AC
  Filled 2015-02-20: qty 20

## 2015-02-20 MED ORDER — HYDROCODONE-ACETAMINOPHEN 7.5-325 MG PO TABS: 1.0000 | ORAL_TABLET | Freq: Once | ORAL | Status: DC | PRN

## 2015-02-20 MED ORDER — PROPOFOL 10 MG/ML IV BOLUS
INTRAVENOUS | Status: DC | PRN
  Administered 2015-02-20: 200 mg via INTRAVENOUS

## 2015-02-20 MED ORDER — FENTANYL CITRATE (PF) 100 MCG/2ML IJ SOLN: 25.0000 ug | INTRAMUSCULAR | Status: DC | PRN

## 2015-02-20 MED ORDER — CEFAZOLIN SODIUM-DEXTROSE 2-3 GM-% IV SOLR
2.0000 g | INTRAVENOUS | Status: AC
  Administered 2015-02-20: 2 g via INTRAVENOUS
  Filled 2015-02-20: qty 50

## 2015-02-20 MED ORDER — LACTATED RINGERS IV SOLN
INTRAVENOUS | Status: DC
  Administered 2015-02-20: 13:00:00 via INTRAVENOUS

## 2015-02-20 MED ORDER — FENTANYL CITRATE (PF) 100 MCG/2ML IJ SOLN
INTRAMUSCULAR | Status: DC | PRN
  Administered 2015-02-20: 100 ug via INTRAVENOUS

## 2015-02-20 SURGICAL SUPPLY — 45 items
BAG DECANTER FOR FLEXI CONT (MISCELLANEOUS) IMPLANT
BANDAGE ELASTIC 6 VELCRO ST LF (GAUZE/BANDAGES/DRESSINGS) ×2 IMPLANT
BENZOIN TINCTURE PRP APPL 2/3 (GAUZE/BANDAGES/DRESSINGS) IMPLANT
BNDG GAUZE ELAST 4 BULKY (GAUZE/BANDAGES/DRESSINGS) ×2 IMPLANT
CANISTER SUCTION 2500CC (MISCELLANEOUS) ×2 IMPLANT
CONT SPEC STER OR (MISCELLANEOUS) IMPLANT
COVER SURGICAL LIGHT HANDLE (MISCELLANEOUS) ×2 IMPLANT
DRAPE EXTREMITY T 121X128X90 (DRAPE) ×2 IMPLANT
DRAPE IMP U-DRAPE 54X76 (DRAPES) ×2 IMPLANT
DRAPE INCISE IOBAN 66X45 STRL (DRAPES) IMPLANT
DRAPE LAPAROSCOPIC ABDOMINAL (DRAPES) IMPLANT
DRAPE PED LAPAROTOMY (DRAPES) ×2 IMPLANT
DRAPE PROXIMA HALF (DRAPES) IMPLANT
DRSG ADAPTIC 3X8 NADH LF (GAUZE/BANDAGES/DRESSINGS) ×2 IMPLANT
DRSG PAD ABDOMINAL 8X10 ST (GAUZE/BANDAGES/DRESSINGS) ×2 IMPLANT
DRSG VAC ATS LRG SENSATRAC (GAUZE/BANDAGES/DRESSINGS) IMPLANT
DRSG VAC ATS MED SENSATRAC (GAUZE/BANDAGES/DRESSINGS) IMPLANT
DRSG VAC ATS SM SENSATRAC (GAUZE/BANDAGES/DRESSINGS) IMPLANT
ELECT CAUTERY BLADE 6.4 (BLADE) ×2 IMPLANT
ELECT REM PT RETURN 9FT ADLT (ELECTROSURGICAL) ×2
ELECTRODE REM PT RTRN 9FT ADLT (ELECTROSURGICAL) ×1 IMPLANT
GAUZE SPONGE 4X4 12PLY STRL (GAUZE/BANDAGES/DRESSINGS) ×2 IMPLANT
GLOVE BIO SURGEON STRL SZ 6.5 (GLOVE) ×2 IMPLANT
GLOVE BIOGEL PI IND STRL 7.5 (GLOVE) ×1 IMPLANT
GLOVE BIOGEL PI IND STRL 8 (GLOVE) ×1 IMPLANT
GLOVE BIOGEL PI INDICATOR 7.5 (GLOVE) ×1
GLOVE BIOGEL PI INDICATOR 8 (GLOVE) ×1
GLOVE SURG SS PI 7.5 STRL IVOR (GLOVE) ×2 IMPLANT
GOWN STRL REUS W/ TWL LRG LVL3 (GOWN DISPOSABLE) ×4 IMPLANT
GOWN STRL REUS W/TWL LRG LVL3 (GOWN DISPOSABLE) ×4
KIT BASIN OR (CUSTOM PROCEDURE TRAY) ×2 IMPLANT
KIT ROOM TURNOVER OR (KITS) ×2 IMPLANT
MATRIX SURGICAL PSM 7X10CM (Tissue) ×2 IMPLANT
MICROMATRIX 500MG (Tissue) ×2 IMPLANT
NS IRRIG 1000ML POUR BTL (IV SOLUTION) ×2 IMPLANT
PACK GENERAL/GYN (CUSTOM PROCEDURE TRAY) ×2 IMPLANT
PAD ARMBOARD 7.5X6 YLW CONV (MISCELLANEOUS) ×2 IMPLANT
SOLUTION PARTIC MCRMTRX 500MG (Tissue) ×1 IMPLANT
STAPLER VISISTAT 35W (STAPLE) IMPLANT
SURGILUBE 2OZ TUBE FLIPTOP (MISCELLANEOUS) ×2 IMPLANT
SUT VIC AB 5-0 PS2 18 (SUTURE) ×6 IMPLANT
SWAB COLLECTION DEVICE MRSA (MISCELLANEOUS) IMPLANT
TOWEL OR 17X26 10 PK STRL BLUE (TOWEL DISPOSABLE) IMPLANT
TUBE ANAEROBIC SPECIMEN COL (MISCELLANEOUS) IMPLANT
UNDERPAD 30X30 INCONTINENT (UNDERPADS AND DIAPERS) ×2 IMPLANT
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
## 2015-02-20 NOTE — Transfer of Care (Signed)
Immediate Anesthesia Transfer of Care Note  Patient: Hailey Cobb  Procedure(s) Performed: Procedure(s): LEFT LEG WOUND IRRIGATION AND DEBRIDEMENT WITH ACELL/VAC PLACEMENT (Left)  Patient Location: PACU  Anesthesia Type:General  Level of Consciousness: awake, alert , oriented, patient cooperative and responds to stimulation  Airway & Oxygen Therapy: Patient Spontanous Breathing and Patient connected to nasal cannula oxygen  Post-op Assessment: Report given to RN, Post -op Vital signs reviewed and stable, Patient moving all extremities X 4 and Patient able to stick tongue midline  Post vital signs: stable  Last Vitals:  Filed Vitals:   02/20/15 1529  BP:   Pulse:   Temp: 36.4 C  Resp:     Complications: No apparent anesthesia complications

## 2015-02-20 NOTE — Anesthesia Preprocedure Evaluation (Addendum)
Anesthesia Evaluation  Patient identified by MRN, date of birth, ID band Patient awake    Reviewed: Allergy & Precautions, NPO status , Patient's Chart, lab work & pertinent test results, reviewed documented beta blocker date and time   Airway Mallampati: II  TM Distance: >3 FB Neck ROM: Full    Dental  (+) Partial Lower, Partial Upper   Pulmonary neg pulmonary ROS,  breath sounds clear to auscultation        Cardiovascular hypertension, Pt. on medications and Pt. on home beta blockers + Valvular Problems/Murmurs (Moderate AS by 07/2014 Echo. NL LV function) AS Rhythm:Regular Rate:Normal     Neuro/Psych Seizures -,  SDH    GI/Hepatic GERD-  ,(+) Hepatitis -, Autoimmune  Endo/Other  diabetes, Type 2, Oral Hypoglycemic AgentsHypothyroidism Morbid obesity  Renal/GU negative Renal ROS     Musculoskeletal  (+) Arthritis -,   Abdominal   Peds  Hematology negative hematology ROS (+)   Anesthesia Other Findings   Reproductive/Obstetrics                           Anesthesia Physical Anesthesia Plan  ASA: III  Anesthesia Plan: General   Post-op Pain Management:    Induction: Intravenous  Airway Management Planned: LMA  Additional Equipment:   Intra-op Plan:   Post-operative Plan:   Informed Consent: I have reviewed the patients History and Physical, chart, labs and discussed the procedure including the risks, benefits and alternatives for the proposed anesthesia with the patient or authorized representative who has indicated his/her understanding and acceptance.   Dental advisory given  Plan Discussed with: CRNA  Anesthesia Plan Comments:         Anesthesia Quick Evaluation

## 2015-02-20 NOTE — Discharge Instructions (Signed)
KY gel to left leg daily No VAC needed at this time.

## 2015-02-20 NOTE — Anesthesia Procedure Notes (Signed)
Procedure Name: LMA Insertion Date/Time: 02/20/2015 2:47 PM Performed by: Vennie Homans Pre-anesthesia Checklist: Patient identified, Timeout performed, Emergency Drugs available, Suction available and Patient being monitored Patient Re-evaluated:Patient Re-evaluated prior to inductionOxygen Delivery Method: Circle system utilized Preoxygenation: Pre-oxygenation with 100% oxygen Intubation Type: IV induction Ventilation: Mask ventilation without difficulty LMA: LMA inserted LMA Size: 4.0 Number of attempts: 2 Placement Confirmation: breath sounds checked- equal and bilateral and positive ETCO2 Tube secured with: Tape
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
## 2015-02-21 ENCOUNTER — Encounter (HOSPITAL_COMMUNITY): Payer: Self-pay | Admitting: Plastic Surgery

## 2015-02-22 NOTE — Anesthesia Postprocedure Evaluation (Signed)
  Anesthesia Post-op Note  Patient: Hailey Cobb  Procedure(s) Performed: Procedure(s): LEFT LEG WOUND IRRIGATION AND DEBRIDEMENT WITH ACELL/VAC PLACEMENT (Left)  Patient Location: PACU  Anesthesia Type:General  Level of Consciousness: awake and alert   Airway and Oxygen Therapy: Patient Spontanous Breathing  Post-op Pain: none  Post-op Assessment: Post-op Vital signs reviewed LLE Motor Response: Purposeful movement, Responds to commands LLE Sensation: Full sensation          Post-op Vital Signs: Reviewed  Last Vitals:  Filed Vitals:   02/20/15 1615  BP: 154/53  Pulse: 56  Temp:   Resp: 16    Complications: No apparent anesthesia complications

## 2015-03-04 ENCOUNTER — Encounter (HOSPITAL_BASED_OUTPATIENT_CLINIC_OR_DEPARTMENT_OTHER): Payer: Medicare Other | Attending: Plastic Surgery

## 2015-03-04 DIAGNOSIS — X58XXXA Exposure to other specified factors, initial encounter: Secondary | ICD-10-CM | POA: Insufficient documentation

## 2015-03-04 DIAGNOSIS — I10 Essential (primary) hypertension: Secondary | ICD-10-CM | POA: Insufficient documentation

## 2015-03-04 DIAGNOSIS — T24339A Burn of third degree of unspecified lower leg, initial encounter: Secondary | ICD-10-CM | POA: Insufficient documentation

## 2015-03-04 DIAGNOSIS — E119 Type 2 diabetes mellitus without complications: Secondary | ICD-10-CM | POA: Insufficient documentation

## 2015-03-04 DIAGNOSIS — M1990 Unspecified osteoarthritis, unspecified site: Secondary | ICD-10-CM | POA: Diagnosis not present

## 2015-03-18 ENCOUNTER — Encounter (HOSPITAL_BASED_OUTPATIENT_CLINIC_OR_DEPARTMENT_OTHER): Payer: Medicare Other | Attending: Plastic Surgery

## 2015-03-18 DIAGNOSIS — E119 Type 2 diabetes mellitus without complications: Secondary | ICD-10-CM | POA: Insufficient documentation

## 2015-03-18 DIAGNOSIS — X062XXD Exposure to ignition of other clothing and apparel, subsequent encounter: Secondary | ICD-10-CM | POA: Insufficient documentation

## 2015-03-18 DIAGNOSIS — I10 Essential (primary) hypertension: Secondary | ICD-10-CM | POA: Insufficient documentation

## 2015-03-18 DIAGNOSIS — M1990 Unspecified osteoarthritis, unspecified site: Secondary | ICD-10-CM | POA: Diagnosis not present

## 2015-03-18 DIAGNOSIS — X030XXD Exposure to flames in controlled fire, not in building or structure, subsequent encounter: Secondary | ICD-10-CM | POA: Insufficient documentation

## 2015-03-18 DIAGNOSIS — T24302D Burn of third degree of unspecified site of left lower limb, except ankle and foot, subsequent encounter: Secondary | ICD-10-CM | POA: Diagnosis not present

## 2015-03-20 DIAGNOSIS — M542 Cervicalgia: Secondary | ICD-10-CM | POA: Diagnosis not present

## 2015-03-20 DIAGNOSIS — M5032 Other cervical disc degeneration, mid-cervical region: Secondary | ICD-10-CM | POA: Diagnosis not present

## 2015-03-20 DIAGNOSIS — M9901 Segmental and somatic dysfunction of cervical region: Secondary | ICD-10-CM | POA: Diagnosis not present

## 2015-03-25 DIAGNOSIS — H02831 Dermatochalasis of right upper eyelid: Secondary | ICD-10-CM | POA: Diagnosis not present

## 2015-03-25 DIAGNOSIS — H01004 Unspecified blepharitis left upper eyelid: Secondary | ICD-10-CM | POA: Diagnosis not present

## 2015-03-25 DIAGNOSIS — H02835 Dermatochalasis of left lower eyelid: Secondary | ICD-10-CM | POA: Diagnosis not present

## 2015-03-25 DIAGNOSIS — H01001 Unspecified blepharitis right upper eyelid: Secondary | ICD-10-CM | POA: Diagnosis not present

## 2015-03-25 DIAGNOSIS — H02834 Dermatochalasis of left upper eyelid: Secondary | ICD-10-CM | POA: Diagnosis not present

## 2015-03-25 DIAGNOSIS — H01005 Unspecified blepharitis left lower eyelid: Secondary | ICD-10-CM | POA: Diagnosis not present

## 2015-03-25 DIAGNOSIS — H3531 Nonexudative age-related macular degeneration: Secondary | ICD-10-CM | POA: Diagnosis not present

## 2015-03-25 DIAGNOSIS — E11329 Type 2 diabetes mellitus with mild nonproliferative diabetic retinopathy without macular edema: Secondary | ICD-10-CM | POA: Diagnosis not present

## 2015-03-25 DIAGNOSIS — H02832 Dermatochalasis of right lower eyelid: Secondary | ICD-10-CM | POA: Diagnosis not present

## 2015-03-25 DIAGNOSIS — H01002 Unspecified blepharitis right lower eyelid: Secondary | ICD-10-CM | POA: Diagnosis not present

## 2015-04-01 DIAGNOSIS — E119 Type 2 diabetes mellitus without complications: Secondary | ICD-10-CM | POA: Diagnosis not present

## 2015-04-01 DIAGNOSIS — I10 Essential (primary) hypertension: Secondary | ICD-10-CM | POA: Diagnosis not present

## 2015-04-01 DIAGNOSIS — T24302D Burn of third degree of unspecified site of left lower limb, except ankle and foot, subsequent encounter: Secondary | ICD-10-CM | POA: Diagnosis not present

## 2015-04-01 DIAGNOSIS — M1990 Unspecified osteoarthritis, unspecified site: Secondary | ICD-10-CM | POA: Diagnosis not present

## 2015-04-15 DIAGNOSIS — M1990 Unspecified osteoarthritis, unspecified site: Secondary | ICD-10-CM | POA: Diagnosis not present

## 2015-04-15 DIAGNOSIS — I10 Essential (primary) hypertension: Secondary | ICD-10-CM | POA: Diagnosis not present

## 2015-04-15 DIAGNOSIS — E119 Type 2 diabetes mellitus without complications: Secondary | ICD-10-CM | POA: Diagnosis not present

## 2015-04-15 DIAGNOSIS — T24302D Burn of third degree of unspecified site of left lower limb, except ankle and foot, subsequent encounter: Secondary | ICD-10-CM | POA: Diagnosis not present

## 2015-04-29 ENCOUNTER — Encounter (HOSPITAL_BASED_OUTPATIENT_CLINIC_OR_DEPARTMENT_OTHER): Payer: Medicare Other | Attending: Plastic Surgery

## 2015-04-29 DIAGNOSIS — E119 Type 2 diabetes mellitus without complications: Secondary | ICD-10-CM | POA: Insufficient documentation

## 2015-04-29 DIAGNOSIS — M1990 Unspecified osteoarthritis, unspecified site: Secondary | ICD-10-CM | POA: Diagnosis not present

## 2015-04-29 DIAGNOSIS — F419 Anxiety disorder, unspecified: Secondary | ICD-10-CM | POA: Diagnosis not present

## 2015-04-29 DIAGNOSIS — T24332D Burn of third degree of left lower leg, subsequent encounter: Secondary | ICD-10-CM | POA: Diagnosis not present

## 2015-04-29 DIAGNOSIS — X58XXXD Exposure to other specified factors, subsequent encounter: Secondary | ICD-10-CM | POA: Diagnosis not present

## 2015-04-29 DIAGNOSIS — I10 Essential (primary) hypertension: Secondary | ICD-10-CM | POA: Insufficient documentation

## 2015-05-13 DIAGNOSIS — F419 Anxiety disorder, unspecified: Secondary | ICD-10-CM | POA: Diagnosis not present

## 2015-05-13 DIAGNOSIS — T24332D Burn of third degree of left lower leg, subsequent encounter: Secondary | ICD-10-CM | POA: Diagnosis not present

## 2015-05-13 DIAGNOSIS — E119 Type 2 diabetes mellitus without complications: Secondary | ICD-10-CM | POA: Diagnosis not present

## 2015-05-13 DIAGNOSIS — M1990 Unspecified osteoarthritis, unspecified site: Secondary | ICD-10-CM | POA: Diagnosis not present

## 2015-05-13 DIAGNOSIS — I10 Essential (primary) hypertension: Secondary | ICD-10-CM | POA: Diagnosis not present

## 2015-06-05 DIAGNOSIS — Z23 Encounter for immunization: Secondary | ICD-10-CM | POA: Diagnosis not present

## 2015-06-05 DIAGNOSIS — E119 Type 2 diabetes mellitus without complications: Secondary | ICD-10-CM | POA: Diagnosis not present

## 2015-06-10 ENCOUNTER — Encounter (HOSPITAL_BASED_OUTPATIENT_CLINIC_OR_DEPARTMENT_OTHER): Payer: Medicare Other | Attending: Plastic Surgery

## 2015-06-10 DIAGNOSIS — M1990 Unspecified osteoarthritis, unspecified site: Secondary | ICD-10-CM | POA: Diagnosis not present

## 2015-06-10 DIAGNOSIS — X062XXD Exposure to ignition of other clothing and apparel, subsequent encounter: Secondary | ICD-10-CM | POA: Insufficient documentation

## 2015-06-10 DIAGNOSIS — I10 Essential (primary) hypertension: Secondary | ICD-10-CM | POA: Diagnosis not present

## 2015-06-10 DIAGNOSIS — T24302D Burn of third degree of unspecified site of left lower limb, except ankle and foot, subsequent encounter: Secondary | ICD-10-CM | POA: Diagnosis present

## 2015-06-10 DIAGNOSIS — E119 Type 2 diabetes mellitus without complications: Secondary | ICD-10-CM | POA: Insufficient documentation

## 2015-06-10 DIAGNOSIS — F418 Other specified anxiety disorders: Secondary | ICD-10-CM | POA: Diagnosis not present

## 2015-06-25 DIAGNOSIS — R531 Weakness: Secondary | ICD-10-CM | POA: Diagnosis not present

## 2015-06-25 DIAGNOSIS — Z131 Encounter for screening for diabetes mellitus: Secondary | ICD-10-CM | POA: Diagnosis not present

## 2015-06-25 DIAGNOSIS — I350 Nonrheumatic aortic (valve) stenosis: Secondary | ICD-10-CM | POA: Diagnosis not present

## 2015-07-29 DIAGNOSIS — I350 Nonrheumatic aortic (valve) stenosis: Secondary | ICD-10-CM | POA: Insufficient documentation

## 2015-07-30 ENCOUNTER — Encounter: Payer: Self-pay | Admitting: Interventional Cardiology

## 2015-07-30 ENCOUNTER — Ambulatory Visit (INDEPENDENT_AMBULATORY_CARE_PROVIDER_SITE_OTHER): Payer: Medicare Other | Admitting: Interventional Cardiology

## 2015-07-30 VITALS — BP 116/64 | HR 67 | Ht 64.0 in | Wt 192.6 lb

## 2015-07-30 DIAGNOSIS — I6320 Cerebral infarction due to unspecified occlusion or stenosis of unspecified precerebral arteries: Secondary | ICD-10-CM | POA: Diagnosis not present

## 2015-07-30 DIAGNOSIS — I6200 Nontraumatic subdural hemorrhage, unspecified: Secondary | ICD-10-CM | POA: Diagnosis not present

## 2015-07-30 DIAGNOSIS — M5136 Other intervertebral disc degeneration, lumbar region: Secondary | ICD-10-CM | POA: Diagnosis not present

## 2015-07-30 DIAGNOSIS — I4510 Unspecified right bundle-branch block: Secondary | ICD-10-CM

## 2015-07-30 DIAGNOSIS — M9903 Segmental and somatic dysfunction of lumbar region: Secondary | ICD-10-CM | POA: Diagnosis not present

## 2015-07-30 DIAGNOSIS — E1159 Type 2 diabetes mellitus with other circulatory complications: Secondary | ICD-10-CM

## 2015-07-30 DIAGNOSIS — I350 Nonrheumatic aortic (valve) stenosis: Secondary | ICD-10-CM

## 2015-07-30 DIAGNOSIS — S065X9A Traumatic subdural hemorrhage with loss of consciousness of unspecified duration, initial encounter: Secondary | ICD-10-CM

## 2015-07-30 DIAGNOSIS — I10 Essential (primary) hypertension: Secondary | ICD-10-CM

## 2015-07-30 DIAGNOSIS — M9901 Segmental and somatic dysfunction of cervical region: Secondary | ICD-10-CM | POA: Diagnosis not present

## 2015-07-30 DIAGNOSIS — S065XAA Traumatic subdural hemorrhage with loss of consciousness status unknown, initial encounter: Secondary | ICD-10-CM

## 2015-07-30 DIAGNOSIS — M9904 Segmental and somatic dysfunction of sacral region: Secondary | ICD-10-CM | POA: Diagnosis not present
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
## 2015-07-30 NOTE — Patient Instructions (Signed)
Medication Instructions:  Your physician recommends that you continue on your current medications as directed. Please refer to the Current Medication list given to you today.   Labwork: None ordered  Testing/Procedures: Your physician has requested that you have an echocardiogram. Echocardiography is a painless test that uses sound waves to create images of your heart. It provides your doctor with information about the size and shape of your heart and how well your heart's chambers and valves are working. This procedure takes approximately one hour. There are no restrictions for this procedure.   Follow-Up: Your physician wants you to follow-up in: 1 year with Dr.Smith You will receive a reminder letter in the mail two months in advance. If you don't receive a letter, please call our office to schedule the follow-up appointment.   Any Other Special Instructions Will Be Listed Below (If Applicable). Call the office if you develop chest pain, fainting, or shortness of breath    If you need a refill on your cardiac medications before your next appointment, please call your pharmacy.

## 2015-07-31 ENCOUNTER — Telehealth: Payer: Self-pay | Admitting: Interventional Cardiology

## 2015-07-31 NOTE — Telephone Encounter (Signed)
Follow Up   Pt daughter returning the call.

## 2015-08-01 NOTE — Telephone Encounter (Signed)
Returned pt daughter's call. I dont see where our office called her. lmtcb if assistance is needed

## 2015-08-15 ENCOUNTER — Ambulatory Visit (HOSPITAL_COMMUNITY): Payer: Medicare Other | Attending: Cardiovascular Disease

## 2015-08-15 ENCOUNTER — Other Ambulatory Visit: Payer: Self-pay

## 2015-08-15 DIAGNOSIS — I350 Nonrheumatic aortic (valve) stenosis: Secondary | ICD-10-CM

## 2015-08-15 DIAGNOSIS — I352 Nonrheumatic aortic (valve) stenosis with insufficiency: Secondary | ICD-10-CM | POA: Insufficient documentation

## 2015-08-15 DIAGNOSIS — I10 Essential (primary) hypertension: Secondary | ICD-10-CM | POA: Insufficient documentation

## 2015-08-15 DIAGNOSIS — E119 Type 2 diabetes mellitus without complications: Secondary | ICD-10-CM | POA: Insufficient documentation

## 2015-08-15 DIAGNOSIS — I517 Cardiomegaly: Secondary | ICD-10-CM | POA: Diagnosis not present

## 2015-08-20 ENCOUNTER — Telehealth: Payer: Self-pay

## 2015-08-20 DIAGNOSIS — I350 Nonrheumatic aortic (valve) stenosis: Secondary | ICD-10-CM

## 2015-08-20 NOTE — Telephone Encounter (Signed)
-----   Message from Belva Crome, MD sent at 08/16/2015 12:38 PM EST ----- Aortic valve is moderately stenosed, but worse than last study. Needs f/u in 1 year with repeat echo prior to OV. Let us know of syncope, dyspnea, or anginal chest pain.

## 2015-08-20 NOTE — Telephone Encounter (Signed)
Pt aware of echo results and Dr.Smith's recommendation. Aortic valve is moderately stenosed, but worse than last study. Needs f/u in 1 year with repeat echo prior to OV. Let us know of syncope, dyspnea, or anginal chest pain. Pt verbalized understanding

## 2015-09-04 DIAGNOSIS — H43813 Vitreous degeneration, bilateral: Secondary | ICD-10-CM | POA: Diagnosis not present

## 2015-09-04 DIAGNOSIS — H35371 Puckering of macula, right eye: Secondary | ICD-10-CM | POA: Diagnosis not present

## 2015-09-04 DIAGNOSIS — E113293 Type 2 diabetes mellitus with mild nonproliferative diabetic retinopathy without macular edema, bilateral: Secondary | ICD-10-CM | POA: Diagnosis not present

## 2015-09-04 DIAGNOSIS — H15831 Staphyloma posticum, right eye: Secondary | ICD-10-CM | POA: Diagnosis not present

## 2015-09-20 DIAGNOSIS — E890 Postprocedural hypothyroidism: Secondary | ICD-10-CM | POA: Diagnosis not present

## 2015-09-20 DIAGNOSIS — I350 Nonrheumatic aortic (valve) stenosis: Secondary | ICD-10-CM | POA: Diagnosis not present

## 2015-09-20 DIAGNOSIS — I10 Essential (primary) hypertension: Secondary | ICD-10-CM | POA: Diagnosis not present

## 2015-09-20 DIAGNOSIS — E11319 Type 2 diabetes mellitus with unspecified diabetic retinopathy without macular edema: Secondary | ICD-10-CM | POA: Diagnosis not present

## 2015-09-20 DIAGNOSIS — Z7984 Long term (current) use of oral hypoglycemic drugs: Secondary | ICD-10-CM | POA: Diagnosis not present

## 2015-09-20 DIAGNOSIS — E782 Mixed hyperlipidemia: Secondary | ICD-10-CM | POA: Diagnosis not present

## 2015-10-14 DIAGNOSIS — M5136 Other intervertebral disc degeneration, lumbar region: Secondary | ICD-10-CM | POA: Diagnosis not present

## 2015-10-14 DIAGNOSIS — M9901 Segmental and somatic dysfunction of cervical region: Secondary | ICD-10-CM | POA: Diagnosis not present

## 2015-10-14 DIAGNOSIS — M9904 Segmental and somatic dysfunction of sacral region: Secondary | ICD-10-CM | POA: Diagnosis not present

## 2015-10-14 DIAGNOSIS — M9903 Segmental and somatic dysfunction of lumbar region: Secondary | ICD-10-CM | POA: Diagnosis not present

## 2015-11-07 DIAGNOSIS — W19XXXA Unspecified fall, initial encounter: Secondary | ICD-10-CM | POA: Diagnosis not present

## 2015-11-07 DIAGNOSIS — M1990 Unspecified osteoarthritis, unspecified site: Secondary | ICD-10-CM | POA: Diagnosis not present

## 2015-11-07 DIAGNOSIS — R0781 Pleurodynia: Secondary | ICD-10-CM | POA: Diagnosis not present

## 2015-11-07 DIAGNOSIS — S0993XA Unspecified injury of face, initial encounter: Secondary | ICD-10-CM | POA: Diagnosis not present

## 2015-11-07 DIAGNOSIS — M25511 Pain in right shoulder: Secondary | ICD-10-CM | POA: Diagnosis not present

## 2015-11-07 DIAGNOSIS — J3489 Other specified disorders of nose and nasal sinuses: Secondary | ICD-10-CM | POA: Diagnosis not present

## 2015-11-08 ENCOUNTER — Ambulatory Visit
Admission: RE | Admit: 2015-11-08 | Discharge: 2015-11-08 | Disposition: A | Discharge status: Home / Self Care | Payer: Medicare Other | Source: Ambulatory Visit | Attending: Physician Assistant | Admitting: Physician Assistant

## 2015-11-08 ENCOUNTER — Other Ambulatory Visit: Payer: Self-pay | Admitting: Physician Assistant

## 2015-11-08 DIAGNOSIS — R0789 Other chest pain: Secondary | ICD-10-CM

## 2015-11-08 DIAGNOSIS — M25511 Pain in right shoulder: Secondary | ICD-10-CM | POA: Diagnosis not present

## 2015-11-08 DIAGNOSIS — R0781 Pleurodynia: Secondary | ICD-10-CM | POA: Diagnosis not present

## 2015-11-08 DIAGNOSIS — S0993XA Unspecified injury of face, initial encounter: Secondary | ICD-10-CM

## 2015-11-08 DIAGNOSIS — S0992XA Unspecified injury of nose, initial encounter: Secondary | ICD-10-CM

## 2015-11-08 DIAGNOSIS — R220 Localized swelling, mass and lump, head: Secondary | ICD-10-CM | POA: Diagnosis not present

## 2015-11-14 DIAGNOSIS — M9904 Segmental and somatic dysfunction of sacral region: Secondary | ICD-10-CM | POA: Diagnosis not present

## 2015-11-14 DIAGNOSIS — M9903 Segmental and somatic dysfunction of lumbar region: Secondary | ICD-10-CM | POA: Diagnosis not present

## 2015-11-14 DIAGNOSIS — M9901 Segmental and somatic dysfunction of cervical region: Secondary | ICD-10-CM | POA: Diagnosis not present

## 2015-11-14 DIAGNOSIS — M50322 Other cervical disc degeneration at C5-C6 level: Secondary | ICD-10-CM | POA: Diagnosis not present

## 2015-11-15 DIAGNOSIS — M25561 Pain in right knee: Secondary | ICD-10-CM | POA: Diagnosis not present

## 2016-01-06 DIAGNOSIS — M9901 Segmental and somatic dysfunction of cervical region: Secondary | ICD-10-CM | POA: Diagnosis not present

## 2016-01-06 DIAGNOSIS — M50322 Other cervical disc degeneration at C5-C6 level: Secondary | ICD-10-CM | POA: Diagnosis not present

## 2016-01-06 DIAGNOSIS — M9903 Segmental and somatic dysfunction of lumbar region: Secondary | ICD-10-CM | POA: Diagnosis not present

## 2016-01-06 DIAGNOSIS — M9904 Segmental and somatic dysfunction of sacral region: Secondary | ICD-10-CM | POA: Diagnosis not present

## 2016-03-11 DIAGNOSIS — H43813 Vitreous degeneration, bilateral: Secondary | ICD-10-CM | POA: Diagnosis not present

## 2016-03-11 DIAGNOSIS — H35371 Puckering of macula, right eye: Secondary | ICD-10-CM | POA: Diagnosis not present

## 2016-03-11 DIAGNOSIS — H15831 Staphyloma posticum, right eye: Secondary | ICD-10-CM | POA: Diagnosis not present

## 2016-03-11 DIAGNOSIS — E113293 Type 2 diabetes mellitus with mild nonproliferative diabetic retinopathy without macular edema, bilateral: Secondary | ICD-10-CM | POA: Diagnosis not present

## 2016-03-30 DIAGNOSIS — H5213 Myopia, bilateral: Secondary | ICD-10-CM | POA: Diagnosis not present

## 2016-03-30 DIAGNOSIS — H52223 Regular astigmatism, bilateral: Secondary | ICD-10-CM | POA: Diagnosis not present

## 2016-03-30 DIAGNOSIS — E113293 Type 2 diabetes mellitus with mild nonproliferative diabetic retinopathy without macular edema, bilateral: Secondary | ICD-10-CM | POA: Diagnosis not present

## 2016-03-30 DIAGNOSIS — H01001 Unspecified blepharitis right upper eyelid: Secondary | ICD-10-CM | POA: Diagnosis not present

## 2016-03-30 DIAGNOSIS — H524 Presbyopia: Secondary | ICD-10-CM | POA: Diagnosis not present

## 2016-03-30 DIAGNOSIS — H01002 Unspecified blepharitis right lower eyelid: Secondary | ICD-10-CM | POA: Diagnosis not present

## 2016-03-30 DIAGNOSIS — H353132 Nonexudative age-related macular degeneration, bilateral, intermediate dry stage: Secondary | ICD-10-CM | POA: Diagnosis not present

## 2016-04-07 DIAGNOSIS — M9904 Segmental and somatic dysfunction of sacral region: Secondary | ICD-10-CM | POA: Diagnosis not present

## 2016-04-07 DIAGNOSIS — M9903 Segmental and somatic dysfunction of lumbar region: Secondary | ICD-10-CM | POA: Diagnosis not present

## 2016-04-07 DIAGNOSIS — M9905 Segmental and somatic dysfunction of pelvic region: Secondary | ICD-10-CM | POA: Diagnosis not present

## 2016-04-07 DIAGNOSIS — M5136 Other intervertebral disc degeneration, lumbar region: Secondary | ICD-10-CM | POA: Diagnosis not present

## 2016-05-18 DIAGNOSIS — E782 Mixed hyperlipidemia: Secondary | ICD-10-CM | POA: Diagnosis not present

## 2016-05-18 DIAGNOSIS — E890 Postprocedural hypothyroidism: Secondary | ICD-10-CM | POA: Diagnosis not present

## 2016-05-18 DIAGNOSIS — E11319 Type 2 diabetes mellitus with unspecified diabetic retinopathy without macular edema: Secondary | ICD-10-CM | POA: Diagnosis not present

## 2016-05-18 DIAGNOSIS — I10 Essential (primary) hypertension: Secondary | ICD-10-CM | POA: Diagnosis not present

## 2016-05-18 DIAGNOSIS — Z23 Encounter for immunization: Secondary | ICD-10-CM | POA: Diagnosis not present

## 2016-05-18 DIAGNOSIS — I350 Nonrheumatic aortic (valve) stenosis: Secondary | ICD-10-CM | POA: Diagnosis not present

## 2016-05-18 DIAGNOSIS — Z7984 Long term (current) use of oral hypoglycemic drugs: Secondary | ICD-10-CM | POA: Diagnosis not present

## 2016-06-16 DIAGNOSIS — M9904 Segmental and somatic dysfunction of sacral region: Secondary | ICD-10-CM | POA: Diagnosis not present

## 2016-06-16 DIAGNOSIS — M5136 Other intervertebral disc degeneration, lumbar region: Secondary | ICD-10-CM | POA: Diagnosis not present

## 2016-06-16 DIAGNOSIS — M9905 Segmental and somatic dysfunction of pelvic region: Secondary | ICD-10-CM | POA: Diagnosis not present

## 2016-06-16 DIAGNOSIS — M9903 Segmental and somatic dysfunction of lumbar region: Secondary | ICD-10-CM | POA: Diagnosis not present

## 2016-07-13 ENCOUNTER — Other Ambulatory Visit: Payer: Self-pay | Admitting: Dermatology

## 2016-07-13 DIAGNOSIS — D492 Neoplasm of unspecified behavior of bone, soft tissue, and skin: Secondary | ICD-10-CM | POA: Diagnosis not present

## 2016-07-13 DIAGNOSIS — L820 Inflamed seborrheic keratosis: Secondary | ICD-10-CM | POA: Diagnosis not present

## 2016-07-13 DIAGNOSIS — L931 Subacute cutaneous lupus erythematosus: Secondary | ICD-10-CM | POA: Diagnosis not present

## 2016-07-15 DIAGNOSIS — M9901 Segmental and somatic dysfunction of cervical region: Secondary | ICD-10-CM | POA: Diagnosis not present

## 2016-07-15 DIAGNOSIS — M50322 Other cervical disc degeneration at C5-C6 level: Secondary | ICD-10-CM | POA: Diagnosis not present

## 2016-07-15 DIAGNOSIS — M9902 Segmental and somatic dysfunction of thoracic region: Secondary | ICD-10-CM | POA: Diagnosis not present

## 2016-07-15 DIAGNOSIS — M5033 Other cervical disc degeneration, cervicothoracic region: Secondary | ICD-10-CM | POA: Diagnosis not present

## 2016-07-29 ENCOUNTER — Encounter: Payer: Self-pay | Admitting: Interventional Cardiology

## 2016-07-29 ENCOUNTER — Encounter: Payer: Self-pay | Admitting: Physician Assistant

## 2016-07-29 ENCOUNTER — Ambulatory Visit (INDEPENDENT_AMBULATORY_CARE_PROVIDER_SITE_OTHER): Payer: Medicare Other | Admitting: Interventional Cardiology

## 2016-07-29 VITALS — BP 172/80 | HR 59 | Ht 64.0 in | Wt 189.6 lb

## 2016-07-29 DIAGNOSIS — I4510 Unspecified right bundle-branch block: Secondary | ICD-10-CM | POA: Diagnosis not present

## 2016-07-29 DIAGNOSIS — E1159 Type 2 diabetes mellitus with other circulatory complications: Secondary | ICD-10-CM

## 2016-07-29 DIAGNOSIS — I350 Nonrheumatic aortic (valve) stenosis: Secondary | ICD-10-CM

## 2016-07-29 DIAGNOSIS — I10 Essential (primary) hypertension: Secondary | ICD-10-CM

## 2016-07-29 NOTE — Patient Instructions (Signed)
Medication Instructions:  None  Labwork: None  Testing/Procedures: Your physician has requested that you have an echocardiogram 2-4 weeks prior to your follow up visit in 6-9 months. Echocardiography is a painless test that uses sound waves to create images of your heart. It provides your doctor with information about the size and shape of your heart and how well your heart's chambers and valves are working. This procedure takes approximately one hour. There are no restrictions for this procedure.    Follow-Up: Your physician wants you to follow-up in: 6-9 months with Dr. Tamala Julian. You will receive a reminder letter in the mail two months in advance. If you don't receive a letter, please call our office to schedule the follow-up appointment.   Any Other Special Instructions Will Be Listed Below (If Applicable).     If you need a refill on your cardiac medications before your next appointment, please call your pharmacy.
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
## 2016-09-18 DIAGNOSIS — Z7984 Long term (current) use of oral hypoglycemic drugs: Secondary | ICD-10-CM | POA: Diagnosis not present

## 2016-09-18 DIAGNOSIS — I10 Essential (primary) hypertension: Secondary | ICD-10-CM | POA: Diagnosis not present

## 2016-09-18 DIAGNOSIS — E039 Hypothyroidism, unspecified: Secondary | ICD-10-CM | POA: Diagnosis not present

## 2016-09-18 DIAGNOSIS — B372 Candidiasis of skin and nail: Secondary | ICD-10-CM | POA: Diagnosis not present

## 2016-09-18 DIAGNOSIS — J309 Allergic rhinitis, unspecified: Secondary | ICD-10-CM | POA: Diagnosis not present

## 2016-09-18 DIAGNOSIS — E11319 Type 2 diabetes mellitus with unspecified diabetic retinopathy without macular edema: Secondary | ICD-10-CM | POA: Diagnosis not present

## 2016-09-18 DIAGNOSIS — I350 Nonrheumatic aortic (valve) stenosis: Secondary | ICD-10-CM | POA: Diagnosis not present

## 2016-09-18 DIAGNOSIS — E782 Mixed hyperlipidemia: Secondary | ICD-10-CM | POA: Diagnosis not present

## 2016-10-21 DIAGNOSIS — Z7984 Long term (current) use of oral hypoglycemic drugs: Secondary | ICD-10-CM | POA: Diagnosis not present

## 2016-10-21 DIAGNOSIS — M5136 Other intervertebral disc degeneration, lumbar region: Secondary | ICD-10-CM | POA: Diagnosis not present

## 2016-10-21 DIAGNOSIS — M9903 Segmental and somatic dysfunction of lumbar region: Secondary | ICD-10-CM | POA: Diagnosis not present

## 2016-10-21 DIAGNOSIS — E039 Hypothyroidism, unspecified: Secondary | ICD-10-CM | POA: Diagnosis not present

## 2016-10-21 DIAGNOSIS — E1165 Type 2 diabetes mellitus with hyperglycemia: Secondary | ICD-10-CM | POA: Diagnosis not present

## 2016-10-21 DIAGNOSIS — M9901 Segmental and somatic dysfunction of cervical region: Secondary | ICD-10-CM | POA: Diagnosis not present

## 2016-10-21 DIAGNOSIS — M50322 Other cervical disc degeneration at C5-C6 level: Secondary | ICD-10-CM | POA: Diagnosis not present

## 2016-11-23 DIAGNOSIS — M5031 Other cervical disc degeneration,  high cervical region: Secondary | ICD-10-CM | POA: Diagnosis not present

## 2016-11-23 DIAGNOSIS — M9902 Segmental and somatic dysfunction of thoracic region: Secondary | ICD-10-CM | POA: Diagnosis not present

## 2016-11-23 DIAGNOSIS — M9901 Segmental and somatic dysfunction of cervical region: Secondary | ICD-10-CM | POA: Diagnosis not present

## 2016-11-23 DIAGNOSIS — M5134 Other intervertebral disc degeneration, thoracic region: Secondary | ICD-10-CM | POA: Diagnosis not present

## 2016-12-09 DIAGNOSIS — Z7984 Long term (current) use of oral hypoglycemic drugs: Secondary | ICD-10-CM | POA: Diagnosis not present

## 2016-12-09 DIAGNOSIS — E039 Hypothyroidism, unspecified: Secondary | ICD-10-CM | POA: Diagnosis not present

## 2016-12-09 DIAGNOSIS — E1165 Type 2 diabetes mellitus with hyperglycemia: Secondary | ICD-10-CM | POA: Diagnosis not present

## 2017-01-15 ENCOUNTER — Other Ambulatory Visit (HOSPITAL_COMMUNITY): Payer: Medicare Other

## 2017-01-26 NOTE — Progress Notes (Signed)
Cardiology Office Note    Date:  01/27/2017   ID:  Hailey Cobb, DOB 1931-11-09, MRN [id number]  PCP:  Hailey Fess, MD  Cardiologist: Hailey Grooms, MD   Chief Complaint  Patient presents with  . Follow-up    6 month    History of Present Illness:  Hailey Cobb is a 83 y.o. female presents for Calcific aortic stenosis and hypertension.  The patient has calcific aortic stenosis. She denies significant dyspnea, angina, and syncope. Her husband is critically/chronically ill with dementia, heart disease, and strokes. She has tocare for him physically and emotionally. This is very stressful. Hospice has been recommended but they have decided against enrollment at this point.  She has not had palpitations, edema, or difficulty with PND.   Past Medical History:  Diagnosis Date  . Anxiety   . Autoimmune hepatitis ([place])   . Diabetes mellitus without complication (HCC)    Type 2   . Environmental allergies   . GERD (gastroesophageal reflux disease)   . Heart murmur    mild AS by 01/2010 echo [facility]); moderate AS 07/24/14 echo [facility])  . Hepatitis    Autoimmune   . Hypercholesteremia   . Hypertension   . Hypothyroidism   . IBS (irritable bowel syndrome)   . Mucus pooling in larynx    Begin after having thyroidectomy; pt takes Mucus medication nightly  . Osteoarthritis   . S/P dilatation of esophageal stricture   . Seizures ([place])    subdural hematoma 2015  . Shortness of breath    with exertion  . Subdural hematoma ([place])   . Unilateral vocal cord paralysis   . Vitiligo     Past Surgical History:  Procedure Laterality Date  . ABDOMINAL HYSTERECTOMY    . APPENDECTOMY    . APPLICATION OF A-CELL OF EXTREMITY Left 12/27/2014   Procedure: PLACEMENT OF A-CELL ;  Surgeon: Theodoro Kos, DO;  Location: [place];  Service: Plastics;  Laterality: Left;  . CATARACT EXTRACTION W/ INTRAOCULAR LENS  IMPLANT, BILATERAL Bilateral   . ELBOW BURSA SURGERY Left     . EYE SURGERY    . [name] 12/27/2014   Procedure: IRRIGATION AND DEBRIDEMENT OF LEFT FOOT AND ANKLE BURN WOUNDS WITH SURGICAL PREPS ;  Surgeon: Theodoro Kos, DO;  Location: [place];  Service: Plastics;  Laterality: Left;  . INCISION AND DRAINAGE OF WOUND Left 02/20/2015   Procedure: LEFT LEG WOUND IRRIGATION AND DEBRIDEMENT WITH ACELL/VAC PLACEMENT;  Surgeon: Theodoro Kos, DO;  Location: [place];  Service: Plastics;  Laterality: Left;  . JOINT REPLACEMENT Left    Knee  . JOINT REPLACEMENT Left    Shoulder  . LARYNGOPLASTY  2011   @ [facility]    . THYROIDECTOMY  11-2008  . TOTAL SHOULDER ARTHROPLASTY Right 05/16/2014   Procedure: TOTAL SHOULDER ARTHROPLASTY;  Surgeon: Ninetta Lights, MD;  Location: [place];  Service: Orthopedics;  Laterality: Right;  . vocal laryngoplasty     s/p left vocal fold medialization laryngoplasty with Goretex 02/07/10 (Dr. Wonda Amis)    Current Medications: Outpatient Medications Prior to Visit  Medication Sig Dispense Refill  . ALPRAZolam (XANAX) 0.5 MG tablet Take 0.5 mg by mouth at bedtime.     Marland Kitchen amLODipine (NORVASC) 10 MG tablet Take 10 mg by mouth daily.    Marland Kitchen aspirin EC 81 MG tablet Take 81 mg by mouth daily.    Marland Kitchen azelastine (ASTELIN) 137 MCG/SPRAY nasal spray Place  2 sprays into both nostrils at bedtime.     . ciclopirox (LOPROX) 0.77 % cream Apply 1 application topically daily as needed (RASH).     Marland Kitchen doxazosin (CARDURA) 2 MG tablet Take 1 tablet (2 mg total) by mouth each morning. Take 2 tablets (4 mg total) by mouth daily at bedtime.    Marland Kitchen esomeprazole (NEXIUM) 40 MG capsule Take 40 mg by mouth daily as needed (ACID REFLUX).     . furosemide (LASIX) 40 MG tablet Take 20 mg by mouth daily.    Marland Kitchen glucose blood test strip 1 each by Other route daily. Use as instructed    . HYDROcodone-acetaminophen (NORCO) 5-325 MG tablet Take 1 tablet by mouth every 6 (six) hours as needed. (pain)    . hydrOXYzine (ATARAX/VISTARIL) 25 MG tablet Take 25  mg by mouth daily as needed for itching.     . irbesartan (AVAPRO) 150 MG tablet Take 150 mg by mouth 2 (two) times daily.    Marland Kitchen levETIRAcetam (KEPPRA) 500 MG tablet Take 500 mg by mouth 2 (two) times daily.    . Liver Extract (LIVER PO) Take 3 tablets by mouth daily.     . metFORMIN (GLUCOPHAGE) 1000 MG tablet Take 1,000 mg by mouth 2 (two) times daily with a meal.    . metoprolol succinate (TOPROL-XL) 50 MG 24 hr tablet Take 50 mg by mouth 2 (two) times daily. Take with or immediately following a meal.    . Multiple Vitamins-Minerals (VISION VITAMINS PO) Take 1 tablet by mouth daily.    . rosuvastatin (CRESTOR) 20 MG tablet Take 20 mg by mouth daily.    . sitaGLIPtin (JANUVIA) 100 MG tablet Take 100 mg by mouth daily.    . Zinc 50 MG TABS Take 50 mg by mouth 2 (two) times daily.    Marland Kitchen amoxicillin (AMOXIL) 500 MG capsule Take four (4) capsules by mouth as needed prior to dental procedures.  2   No facility-administered medications prior to visit.      Allergies:   Clonidine derivatives; Glimepiride; and Invokana [canagliflozin]   Social History   Social History  . Marital status: Married    Spouse name: N/A  . Number of children: N/A  . Years of education: N/A   Social History Main Topics  . Smoking status: Never Smoker  . Smokeless tobacco: Never Used  . Alcohol use No  . Drug use: No  . Sexual activity: Not Asked   Other Topics Concern  . None   Social History Narrative  . None     Family History:  The patient's family history includes Multiple sclerosis in her sister; Prostate cancer in her brother; Stomach cancer in her mother; Stroke in her brother and brother.   ROS:   Please see the history of present illness.    She has trouble with chronic back pain, snoring, anxiety, difficulty with balance, cough, and excessive fatigue.  All other systems reviewed and are negative.   PHYSICAL EXAM:   VS:  BP 140/86   Pulse (!) 53   Ht 5\' 4"  (1.626 m)   Wt 188 lb (85.3 kg)    BMI 32.27 kg/m    GEN: Well nourished, well developed, in no acute distress  HEENT: normal  Neck: no JVD, carotid bruits, or masses Cardiac: RRR, rubs, or gallops,no edema . There is a 3/6 crescendo decrescendo murmur at the right upper sternal border. Respiratory:  clear to auscultation bilaterally, normal work of breathing GI: soft,  nontender, nondistended, + BS MS: no deformity or atrophy  Skin: warm and dry, no rash Neuro:  Alert and Oriented x 3, Strength and sensation are intact Psych: euthymic mood, full affect  Wt Readings from Last 3 Encounters:  01/27/17 188 lb (85.3 kg)  07/29/16 189 lb 9.6 oz (86 kg)  07/30/15 192 lb 9.6 oz (87.4 kg)      Studies/Labs Reviewed:   EKG:  EKG  Not repeated. The last tracing performed was 07/29/16 which demonstrated a right bundle, left axis deviation, Paul wave progression. There is also biatrial abnormality. No significant change compared to prior.  Recent Labs: No results found for requested labs within last 8760 hours.   Lipid Panel    Component Value Date/Time   CHOL 132 07/24/2014 0450   TRIG 113 07/24/2014 0450   HDL 48 07/24/2014 0450   CHOLHDL 2.8 07/24/2014 0450   VLDL 23 07/24/2014 0450   LDLCALC 61 07/24/2014 0450    Additional studies/ records that were reviewed today include:  Echocardiogram 01/27/17:  Study Conclusions   - Left ventricle: The cavity size was normal. There was severe   focal basal hypertrophy of the septum. Systolic function was   vigorous. The estimated ejection fraction was in the range of 65%   to 70%. Wall motion was normal; there were no regional wall   motion abnormalities. Doppler parameters are consistent with   abnormal left ventricular relaxation (grade 1 diastolic   dysfunction). - Aortic valve: Trileaflet; moderately thickened, severely   calcified leaflets. Valve mobility was restricted. There was   moderate stenosis. There was mild regurgitation. Peak velocity   (S): 354 cm/s.  Mean gradient (S): 27 mm Hg. - Mitral valve: There was mild regurgitation. - Left atrium: The atrium was mildly dilated. Volume/bsa, ES   (1-plane Simpson&'s, A4C): 35.2 ml/m^2. - Tricuspid valve: There was mild regurgitation. - Pulmonary arteries: Systolic pressure was mildly increased. PA   peak pressure: 42 mm Hg (S).   Impressions:   - When compared to prior, aortic stenosis remains moderate   ASSESSMENT:    1. Nonrheumatic aortic valve stenosis   2. Essential hypertension   3. Right bundle branch block   4. Controlled type 2 diabetes mellitus with other circulatory complication, without long-term current use of insulin ([place])   5. Hyperlipidemia LDL goal <70      PLAN:  In order of problems listed above:  1. Moderate aortic stenosis with seemingly minimal to no change compared to 18 months ago. She also denies dyspnea, angina, and syncope. Plan clinical follow-up in one year. Cautioned to call if any symptoms. 2. Adequate blood pressure control currently on the medical regimen as listed. 3. Not repeated 4. Followed by primary care. No contemporary laboratory data. 5. Hyperlipidemia on Crestor and followed by primary care    Medication Adjustments/Labs and Tests Ordered: Current medicines are reviewed at length with the patient today.  Concerns regarding medicines are outlined above.  Medication changes, Labs and Tests ordered today are listed in the Patient Instructions below. Patient Instructions  Medication Instructions:  None  Labwork: None  Testing/Procedures: None  Follow-Up: Your physician wants you to follow-up in: 1 year with Dr. Tamala Julian. You will receive a reminder letter in the mail two months in advance. If you don't receive a letter, please call our office to schedule the follow-up appointment.   Any Other Special Instructions Will Be Listed Below (If Applicable).  If you develop shortness of breath, chest pain or  if you pass out, please contact our  office.    If you need a refill on your cardiac medications before your next appointment, please call your pharmacy.      Signed, Hailey Grooms, MD  01/27/2017 4:40 PM    [facility] [address], [place], [place]  [address] Phone: [phone]; Fax: [phone]

## 2017-01-27 ENCOUNTER — Ambulatory Visit (HOSPITAL_COMMUNITY): Payer: Medicare Other | Attending: Cardiology

## 2017-01-27 ENCOUNTER — Other Ambulatory Visit: Payer: Self-pay

## 2017-01-27 ENCOUNTER — Encounter: Payer: Self-pay | Admitting: Interventional Cardiology

## 2017-01-27 ENCOUNTER — Encounter (INDEPENDENT_AMBULATORY_CARE_PROVIDER_SITE_OTHER): Payer: Self-pay

## 2017-01-27 ENCOUNTER — Ambulatory Visit (INDEPENDENT_AMBULATORY_CARE_PROVIDER_SITE_OTHER): Payer: Medicare Other | Admitting: Interventional Cardiology

## 2017-01-27 VITALS — BP 140/86 | HR 53 | Ht 64.0 in | Wt 188.0 lb

## 2017-01-27 DIAGNOSIS — E1159 Type 2 diabetes mellitus with other circulatory complications: Secondary | ICD-10-CM | POA: Diagnosis not present

## 2017-01-27 DIAGNOSIS — I340 Nonrheumatic mitral (valve) insufficiency: Secondary | ICD-10-CM | POA: Insufficient documentation

## 2017-01-27 DIAGNOSIS — I361 Nonrheumatic tricuspid (valve) insufficiency: Secondary | ICD-10-CM | POA: Insufficient documentation

## 2017-01-27 DIAGNOSIS — I4510 Unspecified right bundle-branch block: Secondary | ICD-10-CM | POA: Diagnosis not present

## 2017-01-27 DIAGNOSIS — E785 Hyperlipidemia, unspecified: Secondary | ICD-10-CM | POA: Diagnosis not present

## 2017-01-27 DIAGNOSIS — I352 Nonrheumatic aortic (valve) stenosis with insufficiency: Secondary | ICD-10-CM | POA: Diagnosis not present

## 2017-01-27 DIAGNOSIS — I350 Nonrheumatic aortic (valve) stenosis: Secondary | ICD-10-CM

## 2017-01-27 DIAGNOSIS — I10 Essential (primary) hypertension: Secondary | ICD-10-CM | POA: Diagnosis not present

## 2017-01-27 DIAGNOSIS — I119 Hypertensive heart disease without heart failure: Secondary | ICD-10-CM | POA: Diagnosis not present

## 2017-01-27 NOTE — Patient Instructions (Signed)
Medication Instructions:  None  Labwork: None  Testing/Procedures: None  Follow-Up: Your physician wants you to follow-up in: 1 year with Dr. Tamala Julian. You will receive a reminder letter in the mail two months in advance. If you don't receive a letter, please call our office to schedule the follow-up appointment.   Any Other Special Instructions Will Be Listed Below (If Applicable).  If you develop shortness of breath, chest pain or if you pass out, please contact our office.    If you need a refill on your cardiac medications before your next appointment, please call your pharmacy.

## 2017-02-05 ENCOUNTER — Emergency Department (HOSPITAL_COMMUNITY): Payer: Medicare Other

## 2017-02-05 ENCOUNTER — Inpatient Hospital Stay (HOSPITAL_COMMUNITY)
Admission: EM | Admit: 2017-02-05 | Discharge: 2017-02-10 | DRG: 309 | Disposition: A | Discharge status: Home / Self Care | Payer: Medicare Other | Attending: Family Medicine | Admitting: Family Medicine

## 2017-02-05 ENCOUNTER — Other Ambulatory Visit: Payer: Self-pay

## 2017-02-05 ENCOUNTER — Encounter (HOSPITAL_COMMUNITY): Payer: Self-pay

## 2017-02-05 DIAGNOSIS — R001 Bradycardia, unspecified: Secondary | ICD-10-CM | POA: Diagnosis not present

## 2017-02-05 DIAGNOSIS — Z96652 Presence of left artificial knee joint: Secondary | ICD-10-CM | POA: Diagnosis present

## 2017-02-05 DIAGNOSIS — Z96612 Presence of left artificial shoulder joint: Secondary | ICD-10-CM | POA: Diagnosis present

## 2017-02-05 DIAGNOSIS — R112 Nausea with vomiting, unspecified: Secondary | ICD-10-CM | POA: Diagnosis present

## 2017-02-05 DIAGNOSIS — E7800 Pure hypercholesterolemia, unspecified: Secondary | ICD-10-CM | POA: Diagnosis not present

## 2017-02-05 DIAGNOSIS — I959 Hypotension, unspecified: Secondary | ICD-10-CM | POA: Diagnosis not present

## 2017-02-05 DIAGNOSIS — Z820 Family history of epilepsy and other diseases of the nervous system: Secondary | ICD-10-CM

## 2017-02-05 DIAGNOSIS — H538 Other visual disturbances: Secondary | ICD-10-CM | POA: Diagnosis present

## 2017-02-05 DIAGNOSIS — I5032 Chronic diastolic (congestive) heart failure: Secondary | ICD-10-CM | POA: Diagnosis not present

## 2017-02-05 DIAGNOSIS — I350 Nonrheumatic aortic (valve) stenosis: Secondary | ICD-10-CM | POA: Diagnosis present

## 2017-02-05 DIAGNOSIS — E875 Hyperkalemia: Secondary | ICD-10-CM | POA: Diagnosis present

## 2017-02-05 DIAGNOSIS — Z823 Family history of stroke: Secondary | ICD-10-CM

## 2017-02-05 DIAGNOSIS — E663 Overweight: Secondary | ICD-10-CM | POA: Diagnosis present

## 2017-02-05 DIAGNOSIS — E890 Postprocedural hypothyroidism: Secondary | ICD-10-CM | POA: Diagnosis present

## 2017-02-05 DIAGNOSIS — Z800 Family history of malignant neoplasm of digestive organs: Secondary | ICD-10-CM

## 2017-02-05 DIAGNOSIS — E860 Dehydration: Secondary | ICD-10-CM | POA: Diagnosis present

## 2017-02-05 DIAGNOSIS — G40909 Epilepsy, unspecified, not intractable, without status epilepticus: Secondary | ICD-10-CM | POA: Diagnosis present

## 2017-02-05 DIAGNOSIS — Z7982 Long term (current) use of aspirin: Secondary | ICD-10-CM

## 2017-02-05 DIAGNOSIS — Z8042 Family history of malignant neoplasm of prostate: Secondary | ICD-10-CM

## 2017-02-05 DIAGNOSIS — R0603 Acute respiratory distress: Secondary | ICD-10-CM

## 2017-02-05 DIAGNOSIS — I110 Hypertensive heart disease with heart failure: Secondary | ICD-10-CM | POA: Diagnosis present

## 2017-02-05 DIAGNOSIS — Z96611 Presence of right artificial shoulder joint: Secondary | ICD-10-CM | POA: Diagnosis present

## 2017-02-05 DIAGNOSIS — I10 Essential (primary) hypertension: Secondary | ICD-10-CM | POA: Diagnosis present

## 2017-02-05 DIAGNOSIS — R109 Unspecified abdominal pain: Secondary | ICD-10-CM | POA: Diagnosis present

## 2017-02-05 DIAGNOSIS — B349 Viral infection, unspecified: Secondary | ICD-10-CM | POA: Diagnosis not present

## 2017-02-05 DIAGNOSIS — Z6833 Body mass index (BMI) 33.0-33.9, adult: Secondary | ICD-10-CM

## 2017-02-05 DIAGNOSIS — K219 Gastro-esophageal reflux disease without esophagitis: Secondary | ICD-10-CM | POA: Diagnosis present

## 2017-02-05 DIAGNOSIS — E119 Type 2 diabetes mellitus without complications: Secondary | ICD-10-CM

## 2017-02-05 DIAGNOSIS — Z7984 Long term (current) use of oral hypoglycemic drugs: Secondary | ICD-10-CM

## 2017-02-05 DIAGNOSIS — E785 Hyperlipidemia, unspecified: Secondary | ICD-10-CM | POA: Diagnosis present

## 2017-02-05 DIAGNOSIS — Z79899 Other long term (current) drug therapy: Secondary | ICD-10-CM

## 2017-02-05 LAB — BASIC METABOLIC PANEL
Anion gap: 8 (ref 5–15)
BUN: 20 mg/dL (ref 6–20)
CO2: 26 mmol/L (ref 22–32)
Calcium: 10.4 mg/dL — ABNORMAL HIGH (ref 8.9–10.3)
Chloride: 98 mmol/L — ABNORMAL LOW (ref 101–111)
Creatinine, Ser: 0.79 mg/dL (ref 0.44–1.00)
GFR calc Af Amer: >60 mL/min (ref >60)
GFR calc non Af Amer: >60 mL/min (ref >60)
Glucose, Bld: 131 mg/dL — ABNORMAL HIGH (ref 65–99)
Potassium: 5.2 mmol/L — ABNORMAL HIGH (ref 3.5–5.1)
Sodium: 132 mmol/L — ABNORMAL LOW (ref 135–145)

## 2017-02-05 LAB — I-STAT TROPONIN, ED: Troponin i, poc: 0.02 ng/mL (ref 0.00–0.08)

## 2017-02-05 LAB — CBC
HCT: 39.1 % (ref 36.0–46.0)
Hemoglobin: 12.9 g/dL (ref 12.0–15.0)
MCH: 29.6 pg (ref 26.0–34.0)
MCHC: 33 g/dL (ref 30.0–36.0)
MCV: 89.7 fL (ref 78.0–100.0)
Platelets: 138 10*3/uL — ABNORMAL LOW (ref 150–400)
RBC: 4.36 MIL/uL (ref 3.87–5.11)
RDW: 13.4 % (ref 11.5–15.5)
WBC: 5.2 10*3/uL (ref 4.0–10.5)

## 2017-02-05 MED ORDER — ATROPINE SULFATE 1 MG/ML IJ SOLN
0.2000 mg | Freq: Once | INTRAMUSCULAR | Status: AC
  Administered 2017-02-05: 0.2 mg via INTRAVENOUS
  Filled 2017-02-05: qty 1

## 2017-02-05 MED ORDER — SODIUM CHLORIDE 0.9 % IV SOLN
Freq: Once | INTRAVENOUS | Status: AC
  Administered 2017-02-06: 03:00:00 via INTRAVENOUS

## 2017-02-05 MED ORDER — ATROPINE SULFATE 1 MG/ML IJ SOLN
0.4000 mg | Freq: Once | INTRAMUSCULAR | Status: AC
  Administered 2017-02-06: 0.4 mg via INTRAVENOUS
  Filled 2017-02-05: qty 1

## 2017-02-05 MED ORDER — ATROPINE SULFATE 1 MG/10ML IJ SOSY
PREFILLED_SYRINGE | INTRAMUSCULAR | Status: AC
  Filled 2017-02-05: qty 10

## 2017-02-05 MED ORDER — SODIUM CHLORIDE 0.9 % IV BOLUS (SEPSIS)
500.0000 mL | Freq: Once | INTRAVENOUS | Status: AC
  Administered 2017-02-06: 500 mL via INTRAVENOUS

## 2017-02-05 MED ORDER — SODIUM CHLORIDE 0.9 % IV SOLN
1.0000 g | Freq: Once | INTRAVENOUS | Status: AC
  Administered 2017-02-05: 1 g via INTRAVENOUS
  Filled 2017-02-05: qty 10

## 2017-02-05 MED ORDER — SODIUM CHLORIDE 0.9 % IV BOLUS (SEPSIS)
500.0000 mL | Freq: Once | INTRAVENOUS | Status: AC
  Administered 2017-02-05: 500 mL via INTRAVENOUS

## 2017-02-05 NOTE — ED Provider Notes (Signed)
[place] DEPT Provider Note   CSN: [id number] Arrival date & time: 02/05/17  2138     History   Chief Complaint Chief Complaint  Patient presents with  . Bradycardia    HPI Hailey Cobb is a 83 y.o. female.  HPI   83 yo F with PMHx autoimmune hepatitis, HTN, HLD, moderate AS here with dizziness, lightheadedness. Pt reports that for the past 2-3 days, she has had persistently worsening mid epigastric abdominal pain with associated dizziness, lightheadedness, and nausea. He has not vomited. She has had extreme dyspnea on exertion and has had poor energy. Has not changed her meds. She thought she had a stomach bug so she did not seek care until today, at which time she was noted to be markedly bradycardic at [facility]. She denies any current CP but does have SOB, lightheadedness, and dizziness.  Past Medical History:  Diagnosis Date  . Anxiety   . Autoimmune hepatitis ([place])   . Diabetes mellitus without complication (HCC)    Type 2   . Environmental allergies   . GERD (gastroesophageal reflux disease)   . Heart murmur    mild AS by 01/2010 echo [facility]); moderate AS 07/24/14 echo [facility])  . Hepatitis    Autoimmune   . Hypercholesteremia   . Hypertension   . Hypothyroidism   . IBS (irritable bowel syndrome)   . Mucus pooling in larynx    Begin after having thyroidectomy; pt takes Mucus medication nightly  . Osteoarthritis   . S/P dilatation of esophageal stricture   . Seizures ([place])    subdural hematoma 2015  . Shortness of breath    with exertion  . Subdural hematoma ([place])   . Unilateral vocal cord paralysis   . Vitiligo     Patient Active Problem List   Diagnosis Date Noted  . Symptomatic bradycardia 02/06/2017  . Right bundle branch block 07/30/2015  . Aortic stenosis 07/29/2015  . Cellulitis of left leg 12/20/2014  . Diabetes mellitus type 2, controlled ([place]) 12/20/2014  . Cellulitis 12/20/2014  . Acute left-sided weakness   . CVA (cerebral infarction)  07/23/2014  . CVA (cerebral vascular accident) ([place]) 07/23/2014  . Diabetes mellitus ([place]) 07/23/2014  . Anxiety 07/23/2014  . Hypothyroidism 07/23/2014  . Essential hypertension 07/23/2014  . Subdural hematoma ([place]) 07/07/2014  . Arthritis of shoulder region, right 05/16/2014    Past Surgical History:  Procedure Laterality Date  . ABDOMINAL HYSTERECTOMY    . APPENDECTOMY    . APPLICATION OF A-CELL OF EXTREMITY Left 12/27/2014   Procedure: PLACEMENT OF A-CELL ;  Surgeon: Theodoro Kos, DO;  Location: [place];  Service: Plastics;  Laterality: Left;  . CATARACT EXTRACTION W/ INTRAOCULAR LENS  IMPLANT, BILATERAL Bilateral   . ELBOW BURSA SURGERY Left   . EYE SURGERY    . [name] 12/27/2014   Procedure: IRRIGATION AND DEBRIDEMENT OF LEFT FOOT AND ANKLE BURN WOUNDS WITH SURGICAL PREPS ;  Surgeon: Theodoro Kos, DO;  Location: [place];  Service: Plastics;  Laterality: Left;  . INCISION AND DRAINAGE OF WOUND Left 02/20/2015   Procedure: LEFT LEG WOUND IRRIGATION AND DEBRIDEMENT WITH ACELL/VAC PLACEMENT;  Surgeon: Theodoro Kos, DO;  Location: [place];  Service: Plastics;  Laterality: Left;  . JOINT REPLACEMENT Left    Knee  . JOINT REPLACEMENT Left    Shoulder  . LARYNGOPLASTY  2011   @ [facility]    . THYROIDECTOMY  11-2008  . TOTAL SHOULDER ARTHROPLASTY Right 05/16/2014  Procedure: TOTAL SHOULDER ARTHROPLASTY;  Surgeon: Ninetta Lights, MD;  Location: [place];  Service: Orthopedics;  Laterality: Right;  . vocal laryngoplasty     s/p left vocal fold medialization laryngoplasty with Goretex 02/07/10 (Dr. Wonda Amis)    OB History    No data available       Home Medications    Prior to Admission medications   Medication Sig Start Date End Date Taking? Authorizing Provider  ALPRAZolam Duanne Moron) 0.5 MG tablet Take 0.5 mg by mouth at bedtime.    Yes [provider]  amLODipine (NORVASC) 10 MG tablet Take 10 mg by mouth daily.   Yes [provider]  aspirin EC 81 MG tablet Take 81 mg by mouth daily.   Yes [provider]  azelastine (ASTELIN) 137 MCG/SPRAY nasal spray Place 2 sprays into both nostrils at bedtime.    Yes [provider]  ciclopirox (LOPROX) 0.77 % cream Apply 1 application topically daily as needed (RASH).    Yes [provider]  doxazosin (CARDURA) 2 MG tablet Take 1 tablet (2 mg total) by mouth each morning. Take 2 tablets (4 mg total) by mouth daily at bedtime.   Yes [provider]  esomeprazole (NEXIUM) 40 MG capsule Take 40 mg by mouth daily as needed (ACID REFLUX).    Yes [provider]  furosemide (LASIX) 40 MG tablet Take 20 mg by mouth daily.   Yes [provider]  HYDROcodone-acetaminophen (NORCO) 5-325 MG tablet Take 1 tablet by mouth See admin instructions. Take 1 tablet every night at bedtime, can take one during the day as needed for pain 01/01/15  Yes [provider]  irbesartan (AVAPRO) 150 MG tablet Take 150 mg by mouth 2 (two) times daily.   Yes [provider]  levETIRAcetam (KEPPRA) 500 MG tablet Take 500 mg by mouth every morning.    Yes [provider]  levothyroxine (SYNTHROID, LEVOTHROID) 100 MCG tablet Take 100 mcg by mouth every morning. 12/21/16  Yes [provider]  Liver Extract (LIVER PO) Take 3 tablets by mouth daily.    Yes [provider]  metFORMIN (GLUCOPHAGE) 1000 MG tablet Take 1,000 mg by mouth 2 (two) times daily with a meal.   Yes [provider]  metoprolol succinate (TOPROL-XL) 50 MG 24 hr tablet Take 50 mg by mouth 2 (two) times daily. Take with or immediately following a meal.   Yes [provider]  Multiple Vitamins-Minerals (VISION VITAMINS PO) Take 1 tablet by mouth daily.   Yes [provider]  rosuvastatin (CRESTOR) 20 MG tablet Take 20 mg by mouth daily.   Yes [provider]  sitaGLIPtin (JANUVIA) 100 MG tablet Take 100 mg by mouth  daily.   Yes [provider]  Zinc 50 MG TABS Take 50 mg by mouth every morning.    Yes [provider]  hydrOXYzine (ATARAX/VISTARIL) 25 MG tablet Take 25 mg by mouth daily as needed for itching.     [provider]    Family History Family History  Problem Relation Age of Onset  . Stomach cancer Mother   . Stroke Brother   . Prostate cancer Brother   . Stroke Brother   . Multiple sclerosis Sister     Social History Social History  Substance Use Topics  . Smoking status: Never Smoker  . Smokeless tobacco: Never Used  . Alcohol use No     Allergies   Clonidine derivatives; Glimepiride; and Invokana [  canagliflozin]   Review of Systems Review of Systems  Constitutional: Positive for fatigue.  Gastrointestinal: Positive for nausea.  Neurological: Positive for weakness and light-headedness.  All other systems reviewed and are negative.    Physical Exam Updated Vital Signs BP (!) 121/57   Pulse (!) 50 Comment: Simultaneous filing. User may not have seen previous data.  Temp 97.8 F (36.6 C)   Resp 19 Comment: Simultaneous filing. User may not have seen previous data.  SpO2 95% Comment: Simultaneous filing. User may not have seen previous data.  Physical Exam  Constitutional: She is oriented to person, place, and time. She appears well-developed and well-nourished. No distress.  HENT:  Head: Normocephalic and atraumatic.  Eyes: Conjunctivae are normal.  Neck: Neck supple.  Cardiovascular: Normal heart sounds.  An irregular rhythm present. Bradycardia present.  Exam reveals no friction rub.   No murmur heard. Pulmonary/Chest: Effort normal and breath sounds normal. No respiratory distress. She has no wheezes. She has no rales.  Abdominal: She exhibits no distension.  Musculoskeletal: She exhibits no edema.  Neurological: She is alert and oriented to person, place, and time. She exhibits normal muscle tone.  Skin: Skin is warm. Capillary  refill takes less than 2 seconds.  Psychiatric: She has a normal mood and affect.  Nursing note and vitals reviewed.    ED Treatments / Results  Labs (all labs ordered are listed, but only abnormal results are displayed) Labs Reviewed  BASIC METABOLIC PANEL - Abnormal; Notable for the following:       Result Value   Sodium 132 (*)    Potassium 5.2 (*)    Chloride 98 (*)    Glucose, Bld 131 (*)    Calcium 10.4 (*)    All other components within normal limits  CBC - Abnormal; Notable for the following:    Platelets 138 (*)    All other components within normal limits  TSH - Abnormal; Notable for the following:    TSH 6.286 (*)    All other components within normal limits  MAGNESIUM  LIPASE, BLOOD  HEPATIC FUNCTION PANEL  T4, FREE  CBC  BASIC METABOLIC PANEL  I-STAT TROPOININ, ED    EKG  EKG Interpretation  Date/Time:  Friday February 05 2017 21:46:43 EDT Ventricular Rate:  36 PR Interval:  188 QRS Duration: 126 QT Interval:  494 QTC Calculation: 382 R Axis:   -35 Text Interpretation:  Marked sinus bradycardia with Premature atrial complexes in a pattern of bigeminy Left axis deviation Right bundle branch block Abnormal ECG HEART RATE DECREASED SINCE LAST EKG Confirmed by Duffy Bruce [phone]) on 02/05/2017 10:37:18 PM       Radiology Dg Chest 2 View  Result Date: 02/05/2017 CLINICAL DATA:  Bradycardia, dizziness and nausea. History of hypertension, diabetes, heart murmur. EXAM: CHEST  2 VIEW COMPARISON:  Chest radiograph November 08, 2015 FINDINGS: The cardiac silhouette is mildly enlarged. Tortuous calcified aorta. Mild chronic interstitial changes. Slight blunting of the LEFT costophrenic angle. No pneumothorax. Surgical clips in the neck compatible with thyroidectomy. Status post bilateral shoulder arthroplasties. IMPRESSION: Mild cardiomegaly and mild chronic interstitial changes. Trace LEFT pleural effusion versus pleural thickening. Electronically Signed   By:  Elon Alas M.D.   On: 02/05/2017 22:46    Procedures .Critical Care Performed by: Duffy Bruce Authorized by: Duffy Bruce     (including critical care time)  CRITICAL CARE Performed by: Evonnie Pat   Total critical care time: 35 minutes  Critical care time was  exclusive of separately billable procedures and treating other patients.  Critical care was necessary to treat or prevent imminent or life-threatening deterioration.  Critical care was time spent personally by me on the following activities: development of treatment plan with patient and/or surrogate as well as nursing, discussions with consultants, evaluation of patient's response to treatment, examination of patient, obtaining history from patient or surrogate, ordering and performing treatments and interventions, ordering and review of laboratory studies, ordering and review of radiographic studies, pulse oximetry and re-evaluation of patient's condition.    Medications Ordered in ED Medications  0.9 %  sodium chloride infusion (not administered)  furosemide (LASIX) tablet 20 mg (not administered)  doxazosin (CARDURA) tablet 2-4 mg (not administered)  levothyroxine (SYNTHROID, LEVOTHROID) tablet 100 mcg (not administered)  hydrOXYzine (ATARAX/VISTARIL) tablet 25 mg (not administered)  aspirin EC tablet 81 mg (not administered)  levETIRAcetam (KEPPRA) tablet 500 mg (not administered)  pantoprazole (PROTONIX) EC tablet 40 mg (not administered)  irbesartan (AVAPRO) tablet 150 mg (not administered)  rosuvastatin (CRESTOR) tablet 20 mg (not administered)  amLODipine (NORVASC) tablet 10 mg (not administered)  enoxaparin (LOVENOX) injection 40 mg (not administered)  sodium chloride flush (NS) 0.9 % injection 3 mL (not administered)  ondansetron (ZOFRAN) tablet 4 mg (not administered)    Or  ondansetron (ZOFRAN) injection 4 mg (not administered)  acetaminophen (TYLENOL) tablet 650 mg (not administered)     Or  acetaminophen (TYLENOL) suppository 650 mg (not administered)  albuterol (PROVENTIL) (2.5 MG/3ML) 0.083% nebulizer solution 2.5 mg (not administered)  atropine injection 0.2 mg (not administered)  atropine injection 0.2 mg (0.2 mg Intravenous Given 02/05/17 2302)  sodium chloride 0.9 % bolus 500 mL (0 mLs Intravenous Stopped 02/06/17 0018)  calcium gluconate 1 g in sodium chloride 0.9 % 100 mL IVPB (0 g Intravenous Stopped 02/06/17 0018)  sodium chloride 0.9 % bolus 500 mL (0 mLs Intravenous Stopped 02/06/17 0156)  atropine injection 0.4 mg (0.4 mg Intravenous Given 02/06/17 0024)     Initial Impression / Assessment and Plan / ED Course  I have reviewed the triage vital signs and the nursing notes.  Pertinent labs & imaging results that were available during my care of the patient were reviewed by me and considered in my medical decision making (see chart for details).     83 yo F with h/o AS, HTN, multiple comorbidities as above here with lightheadedness, nausea, and fatigue. Suspect symptomatic bradycardia 2/2 beta blocker use, complicated by recent viral Gi illness/poor PO intake with dehydration and AKI causing relative beta blocker toxicity. No signs of ischemia and trop is neg. Pt given atropine x 2 with improvement in HR, and she is normotensive, not requiring pacing. She has mild hyperk 2/2 AKI/dehydration and has been given calcium. Cardiology consulted, will admit to hospitalist with IVF, hold BB, and observation.  Final Clinical Impressions(s) / ED Diagnoses   Final diagnoses:  Dehydration  Symptomatic bradycardia    New Prescriptions New Prescriptions   No medications on file     Duffy Bruce, MD 02/06/17 0221

## 2017-02-05 NOTE — ED Triage Notes (Signed)
Pt complaining of dizziness and nausea. Pt states seen by PCP today, told has a low heart rate. Pt denies any chest pain or tightness. Pt states generalized malaise. Pt ambulatory at triage.

## 2017-02-06 ENCOUNTER — Encounter (HOSPITAL_COMMUNITY): Payer: Self-pay

## 2017-02-06 DIAGNOSIS — I110 Hypertensive heart disease with heart failure: Secondary | ICD-10-CM | POA: Diagnosis present

## 2017-02-06 DIAGNOSIS — Z96611 Presence of right artificial shoulder joint: Secondary | ICD-10-CM | POA: Diagnosis present

## 2017-02-06 DIAGNOSIS — I350 Nonrheumatic aortic (valve) stenosis: Secondary | ICD-10-CM | POA: Diagnosis present

## 2017-02-06 DIAGNOSIS — I5032 Chronic diastolic (congestive) heart failure: Secondary | ICD-10-CM | POA: Diagnosis present

## 2017-02-06 DIAGNOSIS — R109 Unspecified abdominal pain: Secondary | ICD-10-CM | POA: Diagnosis not present

## 2017-02-06 DIAGNOSIS — R0603 Acute respiratory distress: Secondary | ICD-10-CM | POA: Diagnosis not present

## 2017-02-06 DIAGNOSIS — R001 Bradycardia, unspecified: Principal | ICD-10-CM

## 2017-02-06 DIAGNOSIS — E663 Overweight: Secondary | ICD-10-CM | POA: Diagnosis present

## 2017-02-06 DIAGNOSIS — E860 Dehydration: Secondary | ICD-10-CM | POA: Diagnosis present

## 2017-02-06 DIAGNOSIS — Z6833 Body mass index (BMI) 33.0-33.9, adult: Secondary | ICD-10-CM | POA: Diagnosis not present

## 2017-02-06 DIAGNOSIS — E890 Postprocedural hypothyroidism: Secondary | ICD-10-CM | POA: Diagnosis present

## 2017-02-06 DIAGNOSIS — R112 Nausea with vomiting, unspecified: Secondary | ICD-10-CM | POA: Diagnosis present

## 2017-02-06 DIAGNOSIS — E875 Hyperkalemia: Secondary | ICD-10-CM | POA: Diagnosis present

## 2017-02-06 DIAGNOSIS — Z7982 Long term (current) use of aspirin: Secondary | ICD-10-CM | POA: Diagnosis not present

## 2017-02-06 DIAGNOSIS — E785 Hyperlipidemia, unspecified: Secondary | ICD-10-CM | POA: Diagnosis present

## 2017-02-06 DIAGNOSIS — E1159 Type 2 diabetes mellitus with other circulatory complications: Secondary | ICD-10-CM | POA: Diagnosis not present

## 2017-02-06 DIAGNOSIS — Z96652 Presence of left artificial knee joint: Secondary | ICD-10-CM | POA: Diagnosis present

## 2017-02-06 DIAGNOSIS — G40909 Epilepsy, unspecified, not intractable, without status epilepticus: Secondary | ICD-10-CM | POA: Diagnosis present

## 2017-02-06 DIAGNOSIS — K219 Gastro-esophageal reflux disease without esophagitis: Secondary | ICD-10-CM | POA: Diagnosis present

## 2017-02-06 DIAGNOSIS — I10 Essential (primary) hypertension: Secondary | ICD-10-CM | POA: Diagnosis not present

## 2017-02-06 DIAGNOSIS — E119 Type 2 diabetes mellitus without complications: Secondary | ICD-10-CM | POA: Diagnosis present

## 2017-02-06 DIAGNOSIS — Z96612 Presence of left artificial shoulder joint: Secondary | ICD-10-CM | POA: Diagnosis present

## 2017-02-06 DIAGNOSIS — Z7984 Long term (current) use of oral hypoglycemic drugs: Secondary | ICD-10-CM | POA: Diagnosis not present

## 2017-02-06 DIAGNOSIS — I959 Hypotension, unspecified: Secondary | ICD-10-CM | POA: Diagnosis present

## 2017-02-06 DIAGNOSIS — E7800 Pure hypercholesterolemia, unspecified: Secondary | ICD-10-CM | POA: Diagnosis present

## 2017-02-06 DIAGNOSIS — H538 Other visual disturbances: Secondary | ICD-10-CM | POA: Diagnosis present

## 2017-02-06 LAB — HEPATIC FUNCTION PANEL
ALT: 17 U/L (ref 14–54)
AST: 23 U/L (ref 15–41)
Albumin: 3.4 g/dL — ABNORMAL LOW (ref 3.5–5.0)
Alkaline Phosphatase: 58 U/L (ref 38–126)
Bilirubin, Direct: <0.1 mg/dL — ABNORMAL LOW (ref 0.1–0.5)
Total Bilirubin: 0.6 mg/dL (ref 0.3–1.2)
Total Protein: 6.5 g/dL (ref 6.5–8.1)

## 2017-02-06 LAB — CBC
HCT: 36.1 % (ref 36.0–46.0)
Hemoglobin: 12.2 g/dL (ref 12.0–15.0)
MCH: 30.5 pg (ref 26.0–34.0)
MCHC: 33.8 g/dL (ref 30.0–36.0)
MCV: 90.3 fL (ref 78.0–100.0)
Platelets: 113 10*3/uL — ABNORMAL LOW (ref 150–400)
RBC: 4 MIL/uL (ref 3.87–5.11)
RDW: 13.8 % (ref 11.5–15.5)
WBC: 4.6 10*3/uL (ref 4.0–10.5)

## 2017-02-06 LAB — BASIC METABOLIC PANEL
Anion gap: 7 (ref 5–15)
BUN: 19 mg/dL (ref 6–20)
CO2: 27 mmol/L (ref 22–32)
Calcium: 9.9 mg/dL (ref 8.9–10.3)
Chloride: 98 mmol/L — ABNORMAL LOW (ref 101–111)
Creatinine, Ser: 0.78 mg/dL (ref 0.44–1.00)
GFR calc Af Amer: >60 mL/min (ref >60)
GFR calc non Af Amer: >60 mL/min (ref >60)
Glucose, Bld: 140 mg/dL — ABNORMAL HIGH (ref 65–99)
Potassium: 4.6 mmol/L (ref 3.5–5.1)
Sodium: 132 mmol/L — ABNORMAL LOW (ref 135–145)

## 2017-02-06 LAB — T4, FREE: Free T4: 0.85 ng/dL (ref 0.61–1.12)

## 2017-02-06 LAB — TSH: TSH: 6.286 u[IU]/mL — ABNORMAL HIGH (ref 0.350–4.500)

## 2017-02-06 LAB — GLUCOSE, CAPILLARY
Glucose-Capillary: 151 mg/dL — ABNORMAL HIGH (ref 65–99)
Glucose-Capillary: 123 mg/dL — ABNORMAL HIGH (ref 65–99)
Glucose-Capillary: 158 mg/dL — ABNORMAL HIGH (ref 65–99)
Glucose-Capillary: 181 mg/dL — ABNORMAL HIGH (ref 65–99)

## 2017-02-06 LAB — LIPASE, BLOOD: Lipase: 28 U/L (ref 11–51)

## 2017-02-06 LAB — MAGNESIUM: Magnesium: 1.6 mg/dL — ABNORMAL LOW (ref 1.7–2.4)

## 2017-02-06 LAB — MRSA PCR SCREENING: MRSA by PCR: POSITIVE — AB

## 2017-02-06 MED ORDER — ONDANSETRON HCL 4 MG PO TABS
4.0000 mg | ORAL_TABLET | Freq: Four times a day (QID) | ORAL | Status: DC | PRN
  Administered 2017-02-06: 4 mg via ORAL
  Filled 2017-02-06: qty 1

## 2017-02-06 MED ORDER — SODIUM CHLORIDE 0.9% FLUSH
3.0000 mL | Freq: Two times a day (BID) | INTRAVENOUS | Status: DC
  Administered 2017-02-06 – 2017-02-10 (×8): 3 mL via INTRAVENOUS

## 2017-02-06 MED ORDER — ALUM & MAG HYDROXIDE-SIMETH 200-200-20 MG/5ML PO SUSP
30.0000 mL | ORAL | Status: DC | PRN
  Administered 2017-02-06: 30 mL via ORAL
  Filled 2017-02-06: qty 30

## 2017-02-06 MED ORDER — PANTOPRAZOLE SODIUM 40 MG PO TBEC
40.0000 mg | DELAYED_RELEASE_TABLET | Freq: Every day | ORAL | Status: DC
  Administered 2017-02-06 – 2017-02-10 (×5): 40 mg via ORAL
  Filled 2017-02-06 (×5): qty 1

## 2017-02-06 MED ORDER — ENOXAPARIN SODIUM 40 MG/0.4ML ~~LOC~~ SOLN
40.0000 mg | SUBCUTANEOUS | Status: DC
  Administered 2017-02-06 – 2017-02-09 (×4): 40 mg via SUBCUTANEOUS
  Filled 2017-02-06 (×5): qty 0.4

## 2017-02-06 MED ORDER — ACETAMINOPHEN 325 MG PO TABS
650.0000 mg | ORAL_TABLET | Freq: Four times a day (QID) | ORAL | Status: DC | PRN
  Administered 2017-02-08: 650 mg via ORAL
  Filled 2017-02-06: qty 2

## 2017-02-06 MED ORDER — ACETAMINOPHEN 650 MG RE SUPP: 650.0000 mg | Freq: Four times a day (QID) | RECTAL | Status: DC | PRN

## 2017-02-06 MED ORDER — HYDROXYZINE HCL 25 MG PO TABS: 25.0000 mg | ORAL_TABLET | Freq: Every day | ORAL | Status: DC | PRN

## 2017-02-06 MED ORDER — DOXAZOSIN MESYLATE 2 MG PO TABS
2.0000 mg | ORAL_TABLET | Freq: Every day | ORAL | Status: DC
  Administered 2017-02-06 – 2017-02-10 (×5): 2 mg via ORAL
  Filled 2017-02-06 (×5): qty 1

## 2017-02-06 MED ORDER — IRBESARTAN 150 MG PO TABS
150.0000 mg | ORAL_TABLET | Freq: Two times a day (BID) | ORAL | Status: DC
  Administered 2017-02-06 – 2017-02-10 (×9): 150 mg via ORAL
  Filled 2017-02-06 (×9): qty 1

## 2017-02-06 MED ORDER — CHLORHEXIDINE GLUCONATE CLOTH 2 % EX PADS
6.0000 | MEDICATED_PAD | Freq: Every day | CUTANEOUS | Status: DC
  Administered 2017-02-06 – 2017-02-10 (×4): 6 via TOPICAL

## 2017-02-06 MED ORDER — INSULIN ASPART 100 UNIT/ML ~~LOC~~ SOLN
0.0000 [IU] | Freq: Three times a day (TID) | SUBCUTANEOUS | Status: DC
  Administered 2017-02-07 – 2017-02-08 (×4): 3 [IU] via SUBCUTANEOUS
  Administered 2017-02-08 (×2): 2 [IU] via SUBCUTANEOUS
  Administered 2017-02-09: 3 [IU] via SUBCUTANEOUS
  Administered 2017-02-09: 2 [IU] via SUBCUTANEOUS
  Administered 2017-02-09: 3 [IU] via SUBCUTANEOUS
  Administered 2017-02-10: 5 [IU] via SUBCUTANEOUS

## 2017-02-06 MED ORDER — DOXAZOSIN MESYLATE 2 MG PO TABS: 2.0000 mg | ORAL_TABLET | Freq: Every day | ORAL | Status: DC

## 2017-02-06 MED ORDER — AMLODIPINE BESYLATE 5 MG PO TABS
10.0000 mg | ORAL_TABLET | Freq: Every day | ORAL | Status: DC
  Administered 2017-02-06 – 2017-02-10 (×5): 10 mg via ORAL
  Filled 2017-02-06 (×5): qty 2

## 2017-02-06 MED ORDER — ROSUVASTATIN CALCIUM 20 MG PO TABS
20.0000 mg | ORAL_TABLET | Freq: Every day | ORAL | Status: DC
  Administered 2017-02-06 – 2017-02-10 (×5): 20 mg via ORAL
  Filled 2017-02-06: qty 2
  Filled 2017-02-06: qty 1
  Filled 2017-02-06 (×2): qty 2
  Filled 2017-02-06 (×2): qty 1
  Filled 2017-02-06: qty 2
  Filled 2017-02-06 (×2): qty 1

## 2017-02-06 MED ORDER — ATROPINE SULFATE 1 MG/ML IJ SOLN
0.2000 mg | INTRAMUSCULAR | Status: DC | PRN
  Filled 2017-02-06: qty 0.2

## 2017-02-06 MED ORDER — MUPIROCIN 2 % EX OINT
1.0000 "application " | TOPICAL_OINTMENT | Freq: Two times a day (BID) | CUTANEOUS | Status: DC
  Administered 2017-02-06 – 2017-02-10 (×9): 1 via NASAL
  Filled 2017-02-06: qty 22

## 2017-02-06 MED ORDER — LEVOTHYROXINE SODIUM 100 MCG PO TABS
100.0000 ug | ORAL_TABLET | Freq: Every day | ORAL | Status: DC
  Administered 2017-02-06 – 2017-02-10 (×5): 100 ug via ORAL
  Filled 2017-02-06 (×5): qty 1

## 2017-02-06 MED ORDER — DOXAZOSIN MESYLATE 4 MG PO TABS
4.0000 mg | ORAL_TABLET | Freq: Every day | ORAL | Status: DC
  Administered 2017-02-06 – 2017-02-09 (×4): 4 mg via ORAL
  Filled 2017-02-06 (×4): qty 1

## 2017-02-06 MED ORDER — LEVETIRACETAM 500 MG PO TABS
500.0000 mg | ORAL_TABLET | Freq: Every day | ORAL | Status: DC
  Administered 2017-02-06 – 2017-02-10 (×5): 500 mg via ORAL
  Filled 2017-02-06 (×5): qty 1

## 2017-02-06 MED ORDER — ONDANSETRON HCL 4 MG/2ML IJ SOLN
4.0000 mg | Freq: Four times a day (QID) | INTRAMUSCULAR | Status: DC | PRN
  Administered 2017-02-08: 4 mg via INTRAVENOUS
  Filled 2017-02-06: qty 2

## 2017-02-06 MED ORDER — FUROSEMIDE 20 MG PO TABS: 20.0000 mg | ORAL_TABLET | Freq: Every day | ORAL | Status: DC

## 2017-02-06 MED ORDER — ALBUTEROL SULFATE (2.5 MG/3ML) 0.083% IN NEBU: 2.5000 mg | INHALATION_SOLUTION | RESPIRATORY_TRACT | Status: DC | PRN

## 2017-02-06 MED ORDER — INSULIN ASPART 100 UNIT/ML ~~LOC~~ SOLN: 0.0000 [IU] | Freq: Every day | SUBCUTANEOUS | Status: DC

## 2017-02-06 MED ORDER — ASPIRIN EC 81 MG PO TBEC
81.0000 mg | DELAYED_RELEASE_TABLET | Freq: Every day | ORAL | Status: DC
  Administered 2017-02-06 – 2017-02-10 (×5): 81 mg via ORAL
  Filled 2017-02-06 (×5): qty 1

## 2017-02-06 NOTE — H&P (Addendum)
History and Physical    Hailey BRACHER OZH:[id number] DOB: 04-Nov-1931 DOA: 02/05/2017  Referring MD/NP/PA: Dr. Ellender Hose  PCP: Hulan Fess, MD  Patient coming from: Home  Chief Complaint: slow heart rate  HPI: Hailey Cobb is a 83 y.o. female with medical history significant of HTN, DM type II, hypothyroidism, and SDH; who presented with complaints of slow heart rates. Patient has been feeling under the weather the last 3 days. She complained of lightheaded and nausea. She had recent sick contacts with her daughter who had similar symptoms was never evaluated. Patient reported associated symptoms of subjective fever, chills, blurry vision, decreased appetite, and  epigastric abdominal pain. Patient denies any vomiting, loss of consciousness, focal weakness, headache, She went to urgent care yesterday and was noted to have significantly low heart rates for which she was sent to emergency department for further evaluation. Review of records shows that the patient normally has heart rates in the 50s and she denies any recent changes in her medicines.  ED Course: On admission into the emergency department patient was seen to be afebrile, heart rates 35-42, respirations 16-25, blood pressures 102/50- 156/43, and oxygen saturation maintained. Labs revealed WBC 5.2, Hbg 12.9, platelets 138, sodium 132, potassium 5.2, chloride 98, BUN 20, creatinine 0.79, calcium 10.4, TSH 6.286, troponin 0.02. Cardiology was formally consulted in the emergency department recommended holding metoprolol and intermittent atropine as needed for bradycardia.   Review of Systems: As per HPI otherwise 10 point review of systems negative.   Past Medical History:  Diagnosis Date  . Anxiety   . Autoimmune hepatitis ([place])   . Diabetes mellitus without complication (HCC)    Type 2   . Environmental allergies   . GERD (gastroesophageal reflux disease)   . Heart murmur    mild AS by 01/2010 echo [facility]); moderate AS 07/24/14 echo  [facility])  . Hepatitis    Autoimmune   . Hypercholesteremia   . Hypertension   . Hypothyroidism   . IBS (irritable bowel syndrome)   . Mucus pooling in larynx    Begin after having thyroidectomy; pt takes Mucus medication nightly  . Osteoarthritis   . S/P dilatation of esophageal stricture   . Seizures ([place])    subdural hematoma 2015  . Shortness of breath    with exertion  . Subdural hematoma ([place])   . Unilateral vocal cord paralysis   . Vitiligo     Past Surgical History:  Procedure Laterality Date  . ABDOMINAL HYSTERECTOMY    . APPENDECTOMY    . APPLICATION OF A-CELL OF EXTREMITY Left 12/27/2014   Procedure: PLACEMENT OF A-CELL ;  Surgeon: Theodoro Kos, DO;  Location: [place];  Service: Plastics;  Laterality: Left;  . CATARACT EXTRACTION W/ INTRAOCULAR LENS  IMPLANT, BILATERAL Bilateral   . ELBOW BURSA SURGERY Left   . EYE SURGERY    . [name] 12/27/2014   Procedure: IRRIGATION AND DEBRIDEMENT OF LEFT FOOT AND ANKLE BURN WOUNDS WITH SURGICAL PREPS ;  Surgeon: Theodoro Kos, DO;  Location: [place];  Service: Plastics;  Laterality: Left;  . INCISION AND DRAINAGE OF WOUND Left 02/20/2015   Procedure: LEFT LEG WOUND IRRIGATION AND DEBRIDEMENT WITH ACELL/VAC PLACEMENT;  Surgeon: Theodoro Kos, DO;  Location: [place];  Service: Plastics;  Laterality: Left;  . JOINT REPLACEMENT Left    Knee  . JOINT REPLACEMENT Left    Shoulder  . LARYNGOPLASTY  2011   @ [facility]    .  THYROIDECTOMY  11-2008  . TOTAL SHOULDER ARTHROPLASTY Right 05/16/2014   Procedure: TOTAL SHOULDER ARTHROPLASTY;  Surgeon: Ninetta Lights, MD;  Location: [place];  Service: Orthopedics;  Laterality: Right;  . vocal laryngoplasty     s/p left vocal fold medialization laryngoplasty with Goretex 02/07/10 (Dr. Wonda Amis)     reports that she has never smoked. She has never used smokeless tobacco. She reports that she does not drink alcohol or use drugs.  Allergies  Allergen  Reactions  . Clonidine Derivatives Other (See Comments)    Dizzy  . Glimepiride Other (See Comments)    Hypoglycemia  . Invokana [Canagliflozin] Other (See Comments)    Weakness, perineal irritation    Family History  Problem Relation Age of Onset  . Stomach cancer Mother   . Stroke Brother   . Prostate cancer Brother   . Stroke Brother   . Multiple sclerosis Sister     Prior to Admission medications   Medication Sig Start Date End Date Taking? Authorizing Provider  ALPRAZolam Duanne Moron) 0.5 MG tablet Take 0.5 mg by mouth at bedtime.    Yes [provider]  amLODipine (NORVASC) 10 MG tablet Take 10 mg by mouth daily.   Yes [provider]  aspirin EC 81 MG tablet Take 81 mg by mouth daily.   Yes [provider]  azelastine (ASTELIN) 137 MCG/SPRAY nasal spray Place 2 sprays into both nostrils at bedtime.    Yes [provider]  ciclopirox (LOPROX) 0.77 % cream Apply 1 application topically daily as needed (RASH).    Yes [provider]  doxazosin (CARDURA) 2 MG tablet Take 1 tablet (2 mg total) by mouth each morning. Take 2 tablets (4 mg total) by mouth daily at bedtime.   Yes [provider]  esomeprazole (NEXIUM) 40 MG capsule Take 40 mg by mouth daily as needed (ACID REFLUX).    Yes [provider]  furosemide (LASIX) 40 MG tablet Take 20 mg by mouth daily.   Yes [provider]  HYDROcodone-acetaminophen (NORCO) 5-325 MG tablet Take 1 tablet by mouth See admin instructions. Take 1 tablet every night at bedtime, can take one during the day as needed for pain 01/01/15  Yes [provider]  irbesartan (AVAPRO) 150 MG tablet Take 150 mg by mouth 2 (two) times daily.   Yes [provider]  levETIRAcetam (KEPPRA) 500 MG tablet Take 500 mg by mouth every morning.    Yes [provider]  levothyroxine (SYNTHROID, LEVOTHROID) 100 MCG tablet Take 100 mcg by mouth every morning. 12/21/16  Yes [provider]  Liver Extract (LIVER PO) Take 3 tablets by mouth daily.    Yes [provider]  metFORMIN (GLUCOPHAGE) 1000 MG tablet Take 1,000 mg by mouth 2 (two) times daily with a meal.   Yes [provider]  metoprolol succinate (TOPROL-XL) 50 MG 24 hr tablet Take 50 mg by mouth 2 (two) times daily. Take with or immediately following a meal.   Yes [provider]  Multiple Vitamins-Minerals (VISION VITAMINS PO) Take 1 tablet by mouth daily.   Yes [provider]  rosuvastatin (CRESTOR) 20 MG tablet Take 20 mg by mouth daily.   Yes [provider]  sitaGLIPtin (JANUVIA) 100 MG tablet Take 100 mg by mouth daily.   Yes [provider]  Zinc 50 MG TABS Take 50 mg by mouth every morning.    Yes [provider]  hydrOXYzine (ATARAX/VISTARIL) 25 MG tablet  Take 25 mg by mouth daily as needed for itching.     [provider]    Physical Exam:  Constitutional: Elderly female NAD, calm, comfortable Vitals:   02/05/17 2149 02/05/17 2240 02/05/17 2300 02/05/17 2330  BP: (!) 156/43  (!) 155/46 (!) 102/50  Pulse: (!) 35 (!) 42 (!) 40 (!) 38  Resp: 16 (!) 21 (!) 25 18  Temp: 97.8 F (36.6 C)     SpO2: 97% 96% 97% 92%   Eyes: PERRL, lids and conjunctivae normal ENMT: Mucous membranes are moist. Posterior pharynx clear of any exudate or lesions.Normal dentition.  Neck: normal, supple, no masses, no thyromegaly Respiratory: clear to auscultation bilaterally, no wheezing, no crackles. Normal respiratory effort. No accessory muscle use.  Cardiovascular: Bradycardic, no murmurs / rubs / gallops. Trace lower extremity edema. 2+ pedal pulses. No carotid bruits.  Abdomen: no tenderness, no masses palpated. No hepatosplenomegaly. Bowel sounds positive.  Musculoskeletal: no clubbing / cyanosis. No joint deformity upper and lower extremities. Good ROM, no contractures. Normal muscle tone.  Skin: no rashes, lesions, ulcers. No  induration Neurologic: CN 2-12 grossly intact. Sensation intact, DTR normal. Strength 5/5 in all 4.  Psychiatric: Normal judgment and insight. Alert and oriented x 3. Normal mood.     Labs on Admission: I have personally reviewed following labs and imaging studies  CBC:  Recent Labs Lab 02/05/17 2134  WBC 5.2  HGB 12.9  HCT 39.1  MCV 89.7  PLT 536*   Basic Metabolic Panel:  Recent Labs Lab 02/05/17 2134  NA 132*  K 5.2*  CL 98*  CO2 26  GLUCOSE 131*  BUN 20  CREATININE 0.79  CALCIUM 10.4*   GFR: Estimated Creatinine Clearance: 57.3 mL/min (by C-G formula based on SCr of 0.79 mg/dL). Liver Function Tests: No results for input(s): AST, ALT, ALKPHOS, BILITOT, PROT, ALBUMIN in the last 168 hours. No results for input(s): LIPASE, AMYLASE in the last 168 hours. No results for input(s): AMMONIA in the last 168 hours. Coagulation Profile: No results for input(s): INR, PROTIME in the last 168 hours. Cardiac Enzymes: No results for input(s): CKTOTAL, CKMB, CKMBINDEX, TROPONINI in the last 168 hours. BNP (last 3 results) No results for input(s): PROBNP in the last 8760 hours. HbA1C: No results for input(s): HGBA1C in the last 72 hours. CBG: No results for input(s): GLUCAP in the last 168 hours. Lipid Profile: No results for input(s): CHOL, HDL, LDLCALC, TRIG, CHOLHDL, LDLDIRECT in the last 72 hours. Thyroid Function Tests:  Recent Labs  02/05/17 2321  TSH 6.286*   Anemia Panel: No results for input(s): VITAMINB12, FOLATE, FERRITIN, TIBC, IRON, RETICCTPCT in the last 72 hours. Urine analysis:    Component Value Date/Time   COLORURINE YELLOW 07/23/2014 1445   APPEARANCEUR CLEAR 07/23/2014 1445   LABSPEC 1.008 07/23/2014 1445   PHURINE 5.5 07/23/2014 1445   GLUCOSEU NEGATIVE 07/23/2014 1445   HGBUR NEGATIVE 07/23/2014 1445   BILIRUBINUR NEGATIVE 07/23/2014 1445   KETONESUR NEGATIVE 07/23/2014 1445   PROTEINUR NEGATIVE 07/23/2014 1445   UROBILINOGEN 0.2  07/23/2014 1445   NITRITE NEGATIVE 07/23/2014 1445   LEUKOCYTESUR TRACE (A) 07/23/2014 1445   Sepsis Labs: No results found for this or any previous visit (from the past 240 hour(s)).   Radiological Exams on Admission: Dg Chest 2 View  Result Date: 02/05/2017 CLINICAL DATA:  Bradycardia, dizziness and nausea. History of hypertension, diabetes, heart murmur. EXAM: CHEST  2 VIEW COMPARISON:  Chest radiograph November 08, 2015 FINDINGS: The cardiac silhouette  is mildly enlarged. Tortuous calcified aorta. Mild chronic interstitial changes. Slight blunting of the LEFT costophrenic angle. No pneumothorax. Surgical clips in the neck compatible with thyroidectomy. Status post bilateral shoulder arthroplasties. IMPRESSION: Mild cardiomegaly and mild chronic interstitial changes. Trace LEFT pleural effusion versus pleural thickening. Electronically Signed   By: Elon Alas M.D.   On: 02/05/2017 22:46    EKG: Independently reviewed.Bradycardia with heart rates 35 bpm  Assessment/Plan Symptomatic bradycardia: Acute. Patient presents from PCP clinic for heart rates into the 30s. Patient required atropine multiple times while in the emergency department. Cardiology consulted, but recommending - Admit to stepdown for close monitoring - Atropine prn to bedside  - Continue pacer pads to chest - Hold beta blocker  - Appreciate cardiology consultative services  Dehydration: Patient with reported decreased oral intake. Labs reveal elevated BUN to creatinine ratio suggestive of prerenal cause of symptoms. - IV fluids of normal saline at 177ml/hr as tolerated  Abdominal pain with Nausea: Acute - Symptomatic treatment  Hyperkalemia and hypercalcemia: Acute.  - IV fluids - Recheck BMP in a.m.   Essential hypertension - Held metoprolol - Continue furosemide, amlodipine, irbesartan, Cardura  History of seizure disorder - Continue Keppra  Diabetes mellitus type 2 - Hypoglycemic protocol - Hold  metformin - CBGs every before meals and at bedtime with sensitive SSI  Hyperlipidemia - Continue Crestor   Hypothyroidism: TSH noted to be elevated at 6.2 on admission. Question if secondary to acute infection. - Continue levothyroxine - Will need to recheck level in a couple weeks  GERD -  pharmacy substitution of Protonix    DVT prophylaxis: Lovenox  Code Status: Full  Family Communication: Discussed plan of care with the patient presents because Disposition Plan: Likely discharge home once heart rate stabilize Consults called: Cardiology Admission status: Inpatient  Norval Morton MD [facility] Pager [phone]  If 7PM-7AM, please contact night-coverage [email or web address] Password [facility]  02/06/2017, 12:25 AM

## 2017-02-06 NOTE — ED Notes (Signed)
MD states okay for patient to drink - provided with ice water.

## 2017-02-06 NOTE — Plan of Care (Signed)
Problem: Education: Goal: Knowledge of [facility] Education information/materials will improve Outcome: Progressing Pt given info on MRSA use of protective equipment in hospital and need for handwashing to prevent spread

## 2017-02-06 NOTE — Consult Note (Signed)
CARDIOLOGY INPATIENT CONSULTATION NOTE  Patient ID: Hailey Cobb MRN: [id number], DOB/AGE: [birth year]   Admit date: 02/05/2017   Primary Physician: Hulan Fess, MD Primary Cardiologist: Sinclair Grooms, MD   Reason for Consult:   Sinus Bradycardia  Requesting Physician: Keene Breath MD  HPI: This is a 83 y.o. female with history of calcific moderate to severe AS and HTN who follows Dr. Tamala Julian. She has been doing well however started having syncope and weakness lately. Today presented with abdominal pain, n/v and was found to have bradycardia. Her K was 5.2 and creatinine/BUN were elevated on presentation. Her HR was down in 30s to 40s. She had associated lightheadedness. However did not have a fall. Patient has been having n/v and stomach cramps for the last 2 days. Her daughter has similar illness. She is bradycardiac with it and also hypotensive. She is feeling SOB when she walks around.    Problem List: Past Medical History:  Diagnosis Date  . Anxiety   . Autoimmune hepatitis ([place])   . Diabetes mellitus without complication (HCC)    Type 2   . Environmental allergies   . GERD (gastroesophageal reflux disease)   . Heart murmur    mild AS by 01/2010 echo [facility]); moderate AS 07/24/14 echo [facility])  . Hepatitis    Autoimmune   . Hypercholesteremia   . Hypertension   . Hypothyroidism   . IBS (irritable bowel syndrome)   . Mucus pooling in larynx    Begin after having thyroidectomy; pt takes Mucus medication nightly  . Osteoarthritis   . S/P dilatation of esophageal stricture   . Seizures ([place])    subdural hematoma 2015  . Shortness of breath    with exertion  . Subdural hematoma ([place])   . Unilateral vocal cord paralysis   . Vitiligo     Past Surgical History:  Procedure Laterality Date  . ABDOMINAL HYSTERECTOMY    . APPENDECTOMY    . APPLICATION OF A-CELL OF EXTREMITY Left 12/27/2014   Procedure: PLACEMENT OF A-CELL ;  Surgeon: Theodoro Kos, DO;  Location: [place];  Service: Plastics;  Laterality: Left;  . CATARACT EXTRACTION W/ INTRAOCULAR LENS  IMPLANT, BILATERAL Bilateral   . ELBOW BURSA SURGERY Left   . EYE SURGERY    . [name] 12/27/2014   Procedure: IRRIGATION AND DEBRIDEMENT OF LEFT FOOT AND ANKLE BURN WOUNDS WITH SURGICAL PREPS ;  Surgeon: Theodoro Kos, DO;  Location: [place];  Service: Plastics;  Laterality: Left;  . INCISION AND DRAINAGE OF WOUND Left 02/20/2015   Procedure: LEFT LEG WOUND IRRIGATION AND DEBRIDEMENT WITH ACELL/VAC PLACEMENT;  Surgeon: Theodoro Kos, DO;  Location: [place];  Service: Plastics;  Laterality: Left;  . JOINT REPLACEMENT Left    Knee  . JOINT REPLACEMENT Left    Shoulder  . LARYNGOPLASTY  2011   @ [facility]    . THYROIDECTOMY  11-2008  . TOTAL SHOULDER ARTHROPLASTY Right 05/16/2014   Procedure: TOTAL SHOULDER ARTHROPLASTY;  Surgeon: Ninetta Lights, MD;  Location: [place];  Service: Orthopedics;  Laterality: Right;  . vocal laryngoplasty     s/p left vocal fold medialization laryngoplasty with Goretex 02/07/10 (Dr. Wonda Amis)     Allergies:  Allergies  Allergen Reactions  . Clonidine Derivatives Other (See Comments)    Dizzy  . Glimepiride Other (See Comments)    Hypoglycemia  . Invokana [Canagliflozin] Other (See Comments)    Weakness, perineal irritation  Home Medications Current Facility-Administered Medications  Medication Dose Route Frequency Provider Last Rate Last Dose  . 0.9 %  sodium chloride infusion   Intravenous Once Duffy Bruce, MD      . sodium chloride 0.9 % bolus 500 mL  500 mL Intravenous Once Duffy Bruce, MD 500 mL/hr at 02/06/17 0026 500 mL at 02/06/17 0026   Current Outpatient Prescriptions  Medication Sig Dispense Refill  . ALPRAZolam (XANAX) 0.5 MG tablet Take 0.5 mg by mouth at bedtime.     Marland Kitchen amLODipine (NORVASC) 10 MG tablet Take 10 mg by mouth daily.    Marland Kitchen aspirin EC 81 MG tablet Take 81 mg by mouth daily.    Marland Kitchen azelastine  (ASTELIN) 137 MCG/SPRAY nasal spray Place 2 sprays into both nostrils at bedtime.     . ciclopirox (LOPROX) 0.77 % cream Apply 1 application topically daily as needed (RASH).     Marland Kitchen doxazosin (CARDURA) 2 MG tablet Take 1 tablet (2 mg total) by mouth each morning. Take 2 tablets (4 mg total) by mouth daily at bedtime.    Marland Kitchen esomeprazole (NEXIUM) 40 MG capsule Take 40 mg by mouth daily as needed (ACID REFLUX).     . furosemide (LASIX) 40 MG tablet Take 20 mg by mouth daily.    Marland Kitchen HYDROcodone-acetaminophen (NORCO) 5-325 MG tablet Take 1 tablet by mouth See admin instructions. Take 1 tablet every night at bedtime, can take one during the day as needed for pain    . irbesartan (AVAPRO) 150 MG tablet Take 150 mg by mouth 2 (two) times daily.    Marland Kitchen levETIRAcetam (KEPPRA) 500 MG tablet Take 500 mg by mouth every morning.     Marland Kitchen levothyroxine (SYNTHROID, LEVOTHROID) 100 MCG tablet Take 100 mcg by mouth every morning.  2  . Liver Extract (LIVER PO) Take 3 tablets by mouth daily.     . metFORMIN (GLUCOPHAGE) 1000 MG tablet Take 1,000 mg by mouth 2 (two) times daily with a meal.    . metoprolol succinate (TOPROL-XL) 50 MG 24 hr tablet Take 50 mg by mouth 2 (two) times daily. Take with or immediately following a meal.    . Multiple Vitamins-Minerals (VISION VITAMINS PO) Take 1 tablet by mouth daily.    . rosuvastatin (CRESTOR) 20 MG tablet Take 20 mg by mouth daily.    . sitaGLIPtin (JANUVIA) 100 MG tablet Take 100 mg by mouth daily.    . Zinc 50 MG TABS Take 50 mg by mouth every morning.     . hydrOXYzine (ATARAX/VISTARIL) 25 MG tablet Take 25 mg by mouth daily as needed for itching.        Family History  Problem Relation Age of Onset  . Stomach cancer Mother   . Stroke Brother   . Prostate cancer Brother   . Stroke Brother   . Multiple sclerosis Sister      Social History   Social History  . Marital status: Married    Spouse name: N/A  . Number of children: N/A  . Years of education: N/A    Occupational History  . Not on file.   Social History Main Topics  . Smoking status: Never Smoker  . Smokeless tobacco: Never Used  . Alcohol use No  . Drug use: No  . Sexual activity: Not on file   Other Topics Concern  . Not on file   Social History Narrative  . No narrative on file     Review of Systems: General: fatigue  poor appetite Cardiovascular: dizziness, lightheadedness SOB denied chest pain Dermatological: negative for rash Respiratory: negative for wheezing  Urologic: negative for hematuria Abdominal: nausea, stomach cramps, no jaundice Neurologic: negative for visual changes, syncope, or dizziness Hematology: anemia Psychiatry: non suicidal/homicidal  Musculoskeletal: lower back pain and joint pains   Physical Exam: Vitals: BP (!) 102/50   Pulse (!) 39   Temp 97.8 F (36.6 C)   Resp (!) 22   SpO2 96%  General: not in acute distress Neck: JVP flat, neck supple Heart: bradycardiac regular rhythm S1, S2, no murmurs  Lungs: CTAB  GI: non tender, non distended, bowel sounds present Extremities: no edema Neuro: AAO x 3  Psych: normal affect, no anxiety   Labs:   Results for orders placed or performed during the hospital encounter of 02/05/17 (from the past 24 hour(s))  Basic metabolic panel     Status: Abnormal   Collection Time: 02/05/17  9:34 PM  Result Value Ref Range   Sodium 132 (L) 135 - 145 mmol/L   Potassium 5.2 (H) 3.5 - 5.1 mmol/L   Chloride 98 (L) 101 - 111 mmol/L   CO2 26 22 - 32 mmol/L   Glucose, Bld 131 (H) 65 - 99 mg/dL   BUN 20 6 - 20 mg/dL   Creatinine, Ser 0.79 0.44 - 1.00 mg/dL   Calcium 10.4 (H) 8.9 - 10.3 mg/dL   GFR calc non Af Amer >60 >60 mL/min   GFR calc Af Amer >60 >60 mL/min   Anion gap 8 5 - 15  CBC     Status: Abnormal   Collection Time: 02/05/17  9:34 PM  Result Value Ref Range   WBC 5.2 4.0 - 10.5 K/uL   RBC 4.36 3.87 - 5.11 MIL/uL   Hemoglobin 12.9 12.0 - 15.0 g/dL   HCT 39.1 36.0 - 46.0 %   MCV 89.7  78.0 - 100.0 fL   MCH 29.6 26.0 - 34.0 pg   MCHC 33.0 30.0 - 36.0 g/dL   RDW 13.4 11.5 - 15.5 %   Platelets 138 (L) 150 - 400 K/uL  I-stat troponin, ED     Status: None   Collection Time: 02/05/17 10:29 PM  Result Value Ref Range   Troponin i, poc 0.02 0.00 - 0.08 ng/mL   Comment 3          TSH     Status: Abnormal   Collection Time: 02/05/17 11:21 PM  Result Value Ref Range   TSH 6.286 (H) 0.350 - 4.500 uIU/mL     Radiology/Studies: Dg Chest 2 View  Result Date: 02/05/2017 CLINICAL DATA:  Bradycardia, dizziness and nausea. History of hypertension, diabetes, heart murmur. EXAM: CHEST  2 VIEW COMPARISON:  Chest radiograph November 08, 2015 FINDINGS: The cardiac silhouette is mildly enlarged. Tortuous calcified aorta. Mild chronic interstitial changes. Slight blunting of the LEFT costophrenic angle. No pneumothorax. Surgical clips in the neck compatible with thyroidectomy. Status post bilateral shoulder arthroplasties. IMPRESSION: Mild cardiomegaly and mild chronic interstitial changes. Trace LEFT pleural effusion versus pleural thickening. Electronically Signed   By: Elon Alas M.D.   On: 02/05/2017 22:46    EKG: sinus bardycardia with repol changes  Echo: 01/27/2017 - Left ventricle: The cavity size was normal. There was severe   focal basal hypertrophy of the septum. Systolic function was   vigorous. The estimated ejection fraction was in the range of 65%   to 70%. Wall motion was normal; there were no regional wall   motion abnormalities.  Doppler parameters are consistent with   abnormal left ventricular relaxation (grade 1 diastolic   dysfunction). - Aortic valve: Trileaflet; moderately thickened, severely   calcified leaflets. Valve mobility was restricted. There was   moderate stenosis. There was mild regurgitation. Peak velocity   (S): 354 cm/s. Mean gradient (S): 27 mm Hg. - Mitral valve: There was mild regurgitation. - Left atrium: The atrium was mildly dilated.  Volume/bsa, ES   (1-plane Simpson&'s, A4C): 35.2 ml/m^2. - Tricuspid valve: There was mild regurgitation. - Pulmonary arteries: Systolic pressure was mildly increased. PA   peak pressure: 42 mm Hg (S).   Medical decision making:  Discussed care with the patient Discussed care with the physician on the phone Reviewed labs and imaging personally Reviewed prior records  ASSESSMENT AND PLAN:  This is a 83 y.o. female with known AS and HTN presented with sinus bradycardia  Symptomatic Sinus bradycardia, likely iatrogenic due to BB and possibly combination of renal failure, keppra, amlodipine.  Hold BB, amlodipine and keppra for 24 hours, monitor on telemetry, check TSH, hydrate with at least 2 L - watchful waiting on telemetry for 24 - 48 hours, atropine at bedside and pacer pads on the chest. Reviewed recent echo - if symptomatic, can start on dopamine    Signed, Flossie Dibble, MD MS 02/06/2017, 1:01 AM

## 2017-02-06 NOTE — Progress Notes (Signed)
Patient seen and evaluated earlier the same by my associate. Please refer to H&P for details regarding history assessment and plan. Cardiology has been consulted.  Plan will be to observe patient off of beta blocker. Nursing reports that heart rate is trending up.  Patient seen and evaluated and in no acute distress.  Gen: pt in nad, alert and awake3 CV: s1 and s2 present bradycardic Pulm: no increased wob, no wheezes  Hailey Cobb

## 2017-02-07 ENCOUNTER — Inpatient Hospital Stay (HOSPITAL_COMMUNITY): Payer: Medicare Other

## 2017-02-07 LAB — GLUCOSE, CAPILLARY
Glucose-Capillary: 173 mg/dL — ABNORMAL HIGH (ref 65–99)
Glucose-Capillary: 190 mg/dL — ABNORMAL HIGH (ref 65–99)
Glucose-Capillary: 155 mg/dL — ABNORMAL HIGH (ref 65–99)
Glucose-Capillary: 131 mg/dL — ABNORMAL HIGH (ref 65–99)

## 2017-02-07 LAB — TROPONIN I
Troponin I: <0.03 ng/mL (ref <0.03)
Troponin I: 0.03 ng/mL (ref <0.03)
Troponin I: <0.03 ng/mL (ref <0.03)

## 2017-02-07 MED ORDER — FUROSEMIDE 10 MG/ML IJ SOLN
20.0000 mg | Freq: Two times a day (BID) | INTRAMUSCULAR | Status: DC
Start: 2017-02-07 — End: 2017-02-08
  Administered 2017-02-07 – 2017-02-08 (×2): 20 mg via INTRAVENOUS
  Filled 2017-02-07 (×2): qty 2

## 2017-02-07 MED ORDER — IPRATROPIUM-ALBUTEROL 0.5-2.5 (3) MG/3ML IN SOLN
3.0000 mL | Freq: Once | RESPIRATORY_TRACT | Status: AC
  Administered 2017-02-07: 3 mL via RESPIRATORY_TRACT
  Filled 2017-02-07: qty 3

## 2017-02-07 MED ORDER — FAMOTIDINE 20 MG PO TABS
20.0000 mg | ORAL_TABLET | Freq: Every day | ORAL | Status: DC
  Administered 2017-02-07 – 2017-02-10 (×5): 20 mg via ORAL
  Filled 2017-02-07 (×5): qty 1

## 2017-02-07 MED ORDER — FUROSEMIDE 10 MG/ML IJ SOLN
20.0000 mg | Freq: Once | INTRAMUSCULAR | Status: AC
  Administered 2017-02-07: 20 mg via INTRAVENOUS
  Filled 2017-02-07: qty 2

## 2017-02-07 NOTE — Progress Notes (Signed)
PROGRESS NOTE    Hailey Cobb  KDX:[id number] DOB: [birth year] DOA: 02/05/2017 PCP: Hulan Fess, MD   Brief Narrative:  Patient is an 83 year old with medical history significant for hypertension, diabetes mellitus type 2, hypothyroidism, SDH who presented complaining of lightheadedness and nausea and found to have slow heart rates.   Assessment & Plan:   Principal Problem:   Symptomatic bradycardia -Cardiology consulted and assisting and medical decision making. - Plan is to hold patient beta blocker and observe her off of her beta blocker. Heart rate improved and on last check was within normal limits at 62. Cardiology reports no current indication for pacing  CHF - Mildly overloaded based on chest x-ray and on exam. Currently suspecting diastolic heart failure. Plan is to diuresis with Lasix and most likely switch to oral regimen next a.m.  Active Problems:   Essential hypertension - We'll continue amlodipine, Avapro    Diabetes mellitus type 2, controlled ([place]) - Continue carb modified diet. Plan is to continue sliding scale insulin    Dehydration - resolving. Pt has IVF rehydration and improved oral intake.    Hyperkalemia - Has resolved    Hypercalcemia - Resolved on repeat   DVT prophylaxis: Lovenox Code Status: Full Family Communication: No family at bedside patient did not request any family updates Disposition Plan: Pending improvement in condition and resolution of bradycardia   Consultants:   Cardiology   Procedures: None   Antimicrobials: None   Subjective: The patient has no new complaints. No acute issues overnight  Objective: Vitals:   02/07/17 0400 02/07/17 0736 02/07/17 0859 02/07/17 1125  BP: (!) 175/48  (!) 152/59   Pulse: 74 61  62  Resp: (!) 24 (!) 27  (!) 25  Temp: 97.9 F (36.6 C) 98.6 F (37 C)  98.8 F (37.1 C)  TempSrc: Oral Oral    SpO2: 96% 98% 96% 95%  Weight: 88.9 kg (195 lb 14.4 oz)     Height:         Intake/Output Summary (Last 24 hours) at 02/07/17 1504 Last data filed at 02/07/17 1324  Gross per 24 hour  Intake              720 ml  Output             3500 ml  Net            -2780 ml   Filed Weights   02/06/17 0252 02/07/17 0400  Weight: 88.2 kg (194 lb 8 oz) 88.9 kg (195 lb 14.4 oz)    Examination:  General exam: Appears calm and comfortable, in nad. Respiratory system: Clear to auscultation. Respiratory effort normal. Cardiovascular system: S1 & S2 heard, RRR. No JVD, murmurs, rubs, Gastrointestinal system: Abdomen is nondistended, soft and nontender. No organomegaly or masses felt. Normal bowel sounds heard. Central nervous system: Alert and oriented. No focal neurological deficits. Extremities: Symmetric 5 x 5 power. Skin: No rashes, lesions or ulcers, on limited exam. Psychiatry: Judgement and insight appear normal. Mood & affect appropriate.   Data Reviewed: I have personally reviewed following labs and imaging studies  CBC:  Recent Labs Lab 02/05/17 2134 02/06/17 0259  WBC 5.2 4.6  HGB 12.9 12.2  HCT 39.1 36.1  MCV 89.7 90.3  PLT 138* 976*   Basic Metabolic Panel:  Recent Labs Lab 02/05/17 2134 02/06/17 0259  NA 132* 132*  K 5.2* 4.6  CL 98* 98*  CO2 26 27  GLUCOSE 131* 140*  BUN 20  19  CREATININE 0.79 0.78  CALCIUM 10.4* 9.9  MG  --  1.6*   GFR: Estimated Creatinine Clearance: 58.5 mL/min (by C-G formula based on SCr of 0.78 mg/dL). Liver Function Tests:  Recent Labs Lab 02/06/17 0259  AST 23  ALT 17  ALKPHOS 58  BILITOT 0.6  PROT 6.5  ALBUMIN 3.4*    Recent Labs Lab 02/06/17 0259  LIPASE 28   No results for input(s): AMMONIA in the last 168 hours. Coagulation Profile: No results for input(s): INR, PROTIME in the last 168 hours. Cardiac Enzymes:  Recent Labs Lab 02/07/17 0104 02/07/17 0609 02/07/17 1158  TROPONINI <0.03 0.03* <0.03   BNP (last 3 results) No results for input(s): PROBNP in the last 8760  hours. HbA1C: No results for input(s): HGBA1C in the last 72 hours. CBG:  Recent Labs Lab 02/06/17 1136 02/06/17 1626 02/06/17 2119 02/07/17 0738 02/07/17 1127  GLUCAP 123* 158* 181* 173* 190*   Lipid Profile: No results for input(s): CHOL, HDL, LDLCALC, TRIG, CHOLHDL, LDLDIRECT in the last 72 hours. Thyroid Function Tests:  Recent Labs  02/05/17 2321 02/06/17 0259  TSH 6.286*  --   FREET4  --  0.85   Anemia Panel: No results for input(s): VITAMINB12, FOLATE, FERRITIN, TIBC, IRON, RETICCTPCT in the last 72 hours. Sepsis Labs: No results for input(s): PROCALCITON, LATICACIDVEN in the last 168 hours.  Recent Results (from the past 240 hour(s))  MRSA PCR Screening     Status: Abnormal   Collection Time: 02/06/17  3:03 AM  Result Value Ref Range Status   MRSA by PCR POSITIVE (A) NEGATIVE Final    Comment:        The GeneXpert MRSA Assay (FDA approved for NASAL specimens only), is one component of a comprehensive MRSA colonization surveillance program. It is not intended to diagnose MRSA infection nor to guide or monitor treatment for MRSA infections. RESULT CALLED TO, READ BACK BY AND VERIFIED WITH: Murvin Natal [phone] 06.23.2018 N. MORRIS          Radiology Studies: Dg Chest 2 View  Result Date: 02/05/2017 CLINICAL DATA:  Bradycardia, dizziness and nausea. History of hypertension, diabetes, heart murmur. EXAM: CHEST  2 VIEW COMPARISON:  Chest radiograph November 08, 2015 FINDINGS: The cardiac silhouette is mildly enlarged. Tortuous calcified aorta. Mild chronic interstitial changes. Slight blunting of the LEFT costophrenic angle. No pneumothorax. Surgical clips in the neck compatible with thyroidectomy. Status post bilateral shoulder arthroplasties. IMPRESSION: Mild cardiomegaly and mild chronic interstitial changes. Trace LEFT pleural effusion versus pleural thickening. Electronically Signed   By: Elon Alas M.D.   On: 02/05/2017 22:46   Dg Chest Port 1  View  Result Date: 02/07/2017 CLINICAL DATA:  Respiratory distress. EXAM: PORTABLE CHEST 1 VIEW COMPARISON:  Chest radiograph February 05, 2017 FINDINGS: The cardiac silhouette is enlarged, increased from prior examination. Increasing interstitial prominence with blunting of the costophrenic angles. No pneumothorax. Surgical clips in the neck compatible with thyroidectomy. Bilateral shoulder arthroplasty. Pacer pad and leads overlie the chest. IMPRESSION: Increasing cardiomegaly with interstitial prominence concerning for pulmonary edema. Trace suspected pleural effusions. Electronically Signed   By: Elon Alas M.D.   On: 02/07/2017 03:11     Scheduled Meds: . amLODipine  10 mg Oral Daily  . aspirin EC  81 mg Oral Daily  . Chlorhexidine Gluconate Cloth  6 each Topical Q0600  . doxazosin  2 mg Oral Daily  . doxazosin  4 mg Oral QHS  . enoxaparin (LOVENOX) injection  40  mg Subcutaneous Q24H  . famotidine  20 mg Oral Daily  . furosemide  20 mg Intravenous BID  . insulin aspart  0-15 Units Subcutaneous TID WC  . insulin aspart  0-5 Units Subcutaneous QHS  . irbesartan  150 mg Oral BID  . levETIRAcetam  500 mg Oral Daily  . levothyroxine  100 mcg Oral QAC breakfast  . mupirocin ointment  1 application Nasal BID  . pantoprazole  40 mg Oral Daily  . rosuvastatin  20 mg Oral Daily  . sodium chloride flush  3 mL Intravenous Q12H   Continuous Infusions:   LOS: 1 day    Time spent: > 35 minutes  Velvet Bathe, MD [facility] Pager [phone]  If 7PM-7AM, please contact night-coverage [email or web address] Password [facility] 02/07/2017, 3:04 PM

## 2017-02-07 NOTE — Plan of Care (Signed)
Problem: Fluid Volume: Goal: Ability to maintain a balanced intake and output will improve Outcome: Progressing Increased output from IV lasix

## 2017-02-07 NOTE — Progress Notes (Signed)
Notified MD of cough and cxr results. Orders received.

## 2017-02-07 NOTE — Progress Notes (Signed)
Progress Note  Patient Name: Hailey Cobb Date of Encounter: 02/07/2017  Primary Cardiologist: Tamala Julian  Subjective   Some dyspnea over night needing lasix Stomach better   Inpatient Medications    Scheduled Meds: . amLODipine  10 mg Oral Daily  . aspirin EC  81 mg Oral Daily  . Chlorhexidine Gluconate Cloth  6 each Topical Q0600  . doxazosin  2 mg Oral Daily  . doxazosin  4 mg Oral QHS  . enoxaparin (LOVENOX) injection  40 mg Subcutaneous Q24H  . famotidine  20 mg Oral Daily  . furosemide  20 mg Oral Daily  . insulin aspart  0-15 Units Subcutaneous TID WC  . insulin aspart  0-5 Units Subcutaneous QHS  . irbesartan  150 mg Oral BID  . levETIRAcetam  500 mg Oral Daily  . levothyroxine  100 mcg Oral QAC breakfast  . mupirocin ointment  1 application Nasal BID  . pantoprazole  40 mg Oral Daily  . rosuvastatin  20 mg Oral Daily  . sodium chloride flush  3 mL Intravenous Q12H   Continuous Infusions:  PRN Meds: acetaminophen **OR** acetaminophen, albuterol, alum & mag hydroxide-simeth, atropine, hydrOXYzine, ondansetron **OR** ondansetron (ZOFRAN) IV   Vital Signs    Vitals:   02/06/17 1955 02/07/17 0040 02/07/17 0400 02/07/17 0736  BP: (!) 146/39 (!) 185/40 (!) 175/48   Pulse: (!) 51 (!) 57 74 61  Resp: 20 (!) 25 (!) 24 (!) 27  Temp: 97.8 F (36.6 C) 98.2 F (36.8 C) 97.9 F (36.6 C) 98.6 F (37 C)  TempSrc: Oral Oral Oral Oral  SpO2: 97% 97% 96% 98%  Weight:   88.9 kg (195 lb 14.4 oz)   Height:        Intake/Output Summary (Last 24 hours) at 02/07/17 0759 Last data filed at 02/07/17 0739  Gross per 24 hour  Intake                0 ml  Output             2750 ml  Net            -2750 ml   Filed Weights   02/06/17 0252 02/07/17 0400  Weight: 88.2 kg (194 lb 8 oz) 88.9 kg (195 lb 14.4 oz)    Telemetry    NSR rates 60-70 PVC;s  - Personally Reviewed  ECG    SB LAD RBBB rate 36  - Personally Reviewed  Physical Exam  Overweight white female  GEN:  No acute distress.   Neck: No JVD Cardiac: RRR,AS murmurs, rubs, or gallops.  Respiratory: Clear to auscultation bilaterally. GI: Soft, nontender, non-distended  MS: No edema; No deformity. Neuro:  Nonfocal  Psych: Normal affect   Labs    Chemistry Recent Labs Lab 02/05/17 2134 02/06/17 0259  NA 132* 132*  K 5.2* 4.6  CL 98* 98*  CO2 26 27  GLUCOSE 131* 140*  BUN 20 19  CREATININE 0.79 0.78  CALCIUM 10.4* 9.9  PROT  --  6.5  ALBUMIN  --  3.4*  AST  --  23  ALT  --  17  ALKPHOS  --  58  BILITOT  --  0.6  GFRNONAA >60 >60  GFRAA >60 >60  ANIONGAP 8 7     Hematology Recent Labs Lab 02/05/17 2134 02/06/17 0259  WBC 5.2 4.6  RBC 4.36 4.00  HGB 12.9 12.2  HCT 39.1 36.1  MCV 89.7 90.3  MCH 29.6 30.5  MCHC 33.0 33.8  RDW 13.4 13.8  PLT 138* 113*    Cardiac Enzymes Recent Labs Lab 02/07/17 0104 02/07/17 0609  TROPONINI <0.03 0.03*    Recent Labs Lab 02/05/17 2229  TROPIPOC 0.02     BNPNo results for input(s): BNP, PROBNP in the last 168 hours.   DDimer No results for input(s): DDIMER in the last 168 hours.   Radiology    Dg Chest 2 View  Result Date: 02/05/2017 CLINICAL DATA:  Bradycardia, dizziness and nausea. History of hypertension, diabetes, heart murmur. EXAM: CHEST  2 VIEW COMPARISON:  Chest radiograph November 08, 2015 FINDINGS: The cardiac silhouette is mildly enlarged. Tortuous calcified aorta. Mild chronic interstitial changes. Slight blunting of the LEFT costophrenic angle. No pneumothorax. Surgical clips in the neck compatible with thyroidectomy. Status post bilateral shoulder arthroplasties. IMPRESSION: Mild cardiomegaly and mild chronic interstitial changes. Trace LEFT pleural effusion versus pleural thickening. Electronically Signed   By: Elon Alas M.D.   On: 02/05/2017 22:46   Dg Chest Port 1 View  Result Date: 02/07/2017 CLINICAL DATA:  Respiratory distress. EXAM: PORTABLE CHEST 1 VIEW COMPARISON:  Chest radiograph February 05, 2017  FINDINGS: The cardiac silhouette is enlarged, increased from prior examination. Increasing interstitial prominence with blunting of the costophrenic angles. No pneumothorax. Surgical clips in the neck compatible with thyroidectomy. Bilateral shoulder arthroplasty. Pacer pad and leads overlie the chest. IMPRESSION: Increasing cardiomegaly with interstitial prominence concerning for pulmonary edema. Trace suspected pleural effusions. Electronically Signed   By: Elon Alas M.D.   On: 02/07/2017 03:11    Cardiac Studies   Echo:  EF 65-70% AS mean gradient 27 mmHg peak 50 mmHg  Patient Profile     83 y.o. female  Admitted with abdominal pain and nausea Bradycardic AV nodal drugs held and HR improved. Moderate AS followed by Dr Tamala Julian  Assessment & Plan    1) Bradycardia:  Vagal input from nausea and meds HR in 60-70 range off beta blocker improved No current indication for pacing  2) AS moderate EF normal f/u echo 6 months 3) CHF / last night mild volume overload home lasix is 20 p daily have written for 20 iv bid as volume up and CXR wet  Signed, Jenkins Rouge, MD  02/07/2017, 7:59 AM

## 2017-02-07 NOTE — Progress Notes (Addendum)
Notified Cardiology and Hospitalist team of SOB, cough, diminished course lung sounds and wheezing.  Sats dropped during coughing fits to 70s, Orders received

## 2017-02-08 DIAGNOSIS — I10 Essential (primary) hypertension: Secondary | ICD-10-CM

## 2017-02-08 LAB — GLUCOSE, CAPILLARY
Glucose-Capillary: 138 mg/dL — ABNORMAL HIGH (ref 65–99)
Glucose-Capillary: 140 mg/dL — ABNORMAL HIGH (ref 65–99)
Glucose-Capillary: 192 mg/dL — ABNORMAL HIGH (ref 65–99)
Glucose-Capillary: 126 mg/dL — ABNORMAL HIGH (ref 65–99)

## 2017-02-08 MED ORDER — FUROSEMIDE 20 MG PO TABS
20.0000 mg | ORAL_TABLET | Freq: Every day | ORAL | Status: DC
  Administered 2017-02-09 – 2017-02-10 (×2): 20 mg via ORAL
  Filled 2017-02-08 (×2): qty 1

## 2017-02-08 MED ORDER — HYDROCODONE-ACETAMINOPHEN 5-325 MG PO TABS: 1.0000 | ORAL_TABLET | Freq: Every day | ORAL | Status: DC | PRN

## 2017-02-08 MED ORDER — NEBIVOLOL HCL 5 MG PO TABS
5.0000 mg | ORAL_TABLET | Freq: Every day | ORAL | Status: DC
  Administered 2017-02-08: 5 mg via ORAL
  Filled 2017-02-08: qty 1

## 2017-02-08 MED ORDER — ALPRAZOLAM 0.5 MG PO TABS
0.5000 mg | ORAL_TABLET | Freq: Every day | ORAL | Status: DC
  Administered 2017-02-08 – 2017-02-09 (×2): 0.5 mg via ORAL
  Filled 2017-02-08 (×2): qty 1

## 2017-02-08 MED ORDER — HYDROCODONE-ACETAMINOPHEN 5-325 MG PO TABS
1.0000 | ORAL_TABLET | Freq: Every day | ORAL | Status: DC
  Administered 2017-02-09 – 2017-02-10 (×2): 1 via ORAL
  Filled 2017-02-08 (×2): qty 1

## 2017-02-08 MED ORDER — HYDROCODONE-ACETAMINOPHEN 5-325 MG PO TABS: 1.0000 | ORAL_TABLET | ORAL | Status: DC

## 2017-02-08 NOTE — Progress Notes (Signed)
PROGRESS NOTE    Hailey Cobb  ZOX:[id number] DOB: [birth year] DOA: 02/05/2017 PCP: Hulan Fess, MD   Brief Narrative:  Patient is an 83 year old with medical history significant for hypertension, diabetes mellitus type 2, hypothyroidism, SDH who presented complaining of lightheadedness and nausea and found to have slow heart rates.   Assessment & Plan:   Principal Problem:   Symptomatic bradycardia -Cardiology consulted and assisting and medical decision making. - Plan is to place on low-dose beta blocker and monitor for the next 24 hours  CHF - Placing patient back on oral Lasix regimen starting tomorrow and discontinuing IV Lasix orders for today. Patient compensated. Continue current medication regimen  Active Problems:   Essential hypertension - Will continue amlodipine, Avapro, Bystolic    Diabetes mellitus type 2, controlled ([place]) - Continue carb modified diet. Plan is to continue sliding scale insulin    Dehydration - Resolved    Hyperkalemia -  resolved    Hypercalcemia - Resolved   DVT prophylaxis: Lovenox Code Status: Full Family Communication: No family at bedside patient did not request any family updates Disposition Plan: Consider discharge next a.m. if improved condition   Consultants:   Cardiology   Procedures: None   Antimicrobials: None   Subjective: The patient has no new complaints. No acute issues overnight  Objective: Vitals:   02/08/17 0429 02/08/17 0500 02/08/17 0740 02/08/17 1125  BP: (!) 157/58  (!) 160/40 (!) 141/60  Pulse: 63  64 63  Resp: (!) 26  (!) 24 20  Temp: 98.4 F (36.9 C)  98.2 F (36.8 C) 98.2 F (36.8 C)  TempSrc: Oral  Oral Oral  SpO2: 97%  97% 98%  Weight:  85.6 kg (188 lb 12.8 oz)    Height:        Intake/Output Summary (Last 24 hours) at 02/08/17 1741 Last data filed at 02/08/17 1522  Gross per 24 hour  Intake              540 ml  Output             3075 ml  Net            -2535 ml   Filed  Weights   02/06/17 0252 02/07/17 0400 02/08/17 0500  Weight: 88.2 kg (194 lb 8 oz) 88.9 kg (195 lb 14.4 oz) 85.6 kg (188 lb 12.8 oz)    Examination:  General exam: Patient in no acute distress, alert and awake Respiratory system: Clear to auscultation. Respiratory effort normal. Cardiovascular system: Regular rate and rhythm, S 1 -S2 within normal limits Gastrointestinal system: Abdomen is nondistended, soft and nontender. No organomegaly or masses felt. Normal bowel sounds heard. Central nervous system: Alert and oriented. No focal neurological deficits. Extremities: Symmetric 5 x 5 power. Warm Skin: No rashes, lesions or ulcers, on limited exam. Psychiatry:  Mood & affect appropriate.   Data Reviewed: I have personally reviewed following labs and imaging studies  CBC:  Recent Labs Lab 02/05/17 2134 02/06/17 0259  WBC 5.2 4.6  HGB 12.9 12.2  HCT 39.1 36.1  MCV 89.7 90.3  PLT 138* 811*   Basic Metabolic Panel:  Recent Labs Lab 02/05/17 2134 02/06/17 0259  NA 132* 132*  K 5.2* 4.6  CL 98* 98*  CO2 26 27  GLUCOSE 131* 140*  BUN 20 19  CREATININE 0.79 0.78  CALCIUM 10.4* 9.9  MG  --  1.6*   GFR: Estimated Creatinine Clearance: 57.4 mL/min (by C-G formula based on SCr  of 0.78 mg/dL). Liver Function Tests:  Recent Labs Lab 02/06/17 0259  AST 23  ALT 17  ALKPHOS 58  BILITOT 0.6  PROT 6.5  ALBUMIN 3.4*    Recent Labs Lab 02/06/17 0259  LIPASE 28   No results for input(s): AMMONIA in the last 168 hours. Coagulation Profile: No results for input(s): INR, PROTIME in the last 168 hours. Cardiac Enzymes:  Recent Labs Lab 02/07/17 0104 02/07/17 0609 02/07/17 1158  TROPONINI <0.03 0.03* <0.03   BNP (last 3 results) No results for input(s): PROBNP in the last 8760 hours. HbA1C: No results for input(s): HGBA1C in the last 72 hours. CBG:  Recent Labs Lab 02/07/17 1636 02/07/17 2227 02/08/17 0738 02/08/17 1123 02/08/17 1644  GLUCAP 155* 131*  138* 140* 192*   Lipid Profile: No results for input(s): CHOL, HDL, LDLCALC, TRIG, CHOLHDL, LDLDIRECT in the last 72 hours. Thyroid Function Tests:  Recent Labs  02/05/17 2321 02/06/17 0259  TSH 6.286*  --   FREET4  --  0.85   Anemia Panel: No results for input(s): VITAMINB12, FOLATE, FERRITIN, TIBC, IRON, RETICCTPCT in the last 72 hours. Sepsis Labs: No results for input(s): PROCALCITON, LATICACIDVEN in the last 168 hours.  Recent Results (from the past 240 hour(s))  MRSA PCR Screening     Status: Abnormal   Collection Time: 02/06/17  3:03 AM  Result Value Ref Range Status   MRSA by PCR POSITIVE (A) NEGATIVE Final    Comment:        The GeneXpert MRSA Assay (FDA approved for NASAL specimens only), is one component of a comprehensive MRSA colonization surveillance program. It is not intended to diagnose MRSA infection nor to guide or monitor treatment for MRSA infections. RESULT CALLED TO, READ BACK BY AND VERIFIED WITH: Murvin Natal [phone] 06.23.2018 N. MORRIS          Radiology Studies: Dg Chest Port 1 View  Result Date: 02/07/2017 CLINICAL DATA:  Respiratory distress. EXAM: PORTABLE CHEST 1 VIEW COMPARISON:  Chest radiograph February 05, 2017 FINDINGS: The cardiac silhouette is enlarged, increased from prior examination. Increasing interstitial prominence with blunting of the costophrenic angles. No pneumothorax. Surgical clips in the neck compatible with thyroidectomy. Bilateral shoulder arthroplasty. Pacer pad and leads overlie the chest. IMPRESSION: Increasing cardiomegaly with interstitial prominence concerning for pulmonary edema. Trace suspected pleural effusions. Electronically Signed   By: Elon Alas M.D.   On: 02/07/2017 03:11     Scheduled Meds: . ALPRAZolam  0.5 mg Oral QHS  . amLODipine  10 mg Oral Daily  . aspirin EC  81 mg Oral Daily  . Chlorhexidine Gluconate Cloth  6 each Topical Q0600  . doxazosin  2 mg Oral Daily  . doxazosin  4 mg Oral QHS    . enoxaparin (LOVENOX) injection  40 mg Subcutaneous Q24H  . famotidine  20 mg Oral Daily  . furosemide  20 mg Intravenous BID  . HYDROcodone-acetaminophen  1 tablet Oral QHS  . insulin aspart  0-15 Units Subcutaneous TID WC  . insulin aspart  0-5 Units Subcutaneous QHS  . irbesartan  150 mg Oral BID  . levETIRAcetam  500 mg Oral Daily  . levothyroxine  100 mcg Oral QAC breakfast  . mupirocin ointment  1 application Nasal BID  . nebivolol  5 mg Oral Daily  . pantoprazole  40 mg Oral Daily  . rosuvastatin  20 mg Oral Daily  . sodium chloride flush  3 mL Intravenous Q12H   Continuous Infusions:  LOS: 2 days    Time spent: > 35 minutes  Velvet Bathe, MD [facility] Pager [phone]  If 7PM-7AM, please contact night-coverage [email or web address] Password [facility] 02/08/2017, 5:41 PM

## 2017-02-08 NOTE — Progress Notes (Signed)
Progress Note  Patient Name: Hailey Cobb Date of Encounter: 02/08/2017  Primary Cardiologist: Dr. Tamala Julian  Subjective   Patient denies chest pain, SOB, and palpitations. She states her abdomen still hurts and she has not rested in the hospital.  Inpatient Medications    Scheduled Meds: . amLODipine  10 mg Oral Daily  . aspirin EC  81 mg Oral Daily  . Chlorhexidine Gluconate Cloth  6 each Topical Q0600  . doxazosin  2 mg Oral Daily  . doxazosin  4 mg Oral QHS  . enoxaparin (LOVENOX) injection  40 mg Subcutaneous Q24H  . famotidine  20 mg Oral Daily  . furosemide  20 mg Intravenous BID  . insulin aspart  0-15 Units Subcutaneous TID WC  . insulin aspart  0-5 Units Subcutaneous QHS  . irbesartan  150 mg Oral BID  . levETIRAcetam  500 mg Oral Daily  . levothyroxine  100 mcg Oral QAC breakfast  . mupirocin ointment  1 application Nasal BID  . pantoprazole  40 mg Oral Daily  . rosuvastatin  20 mg Oral Daily  . sodium chloride flush  3 mL Intravenous Q12H   Continuous Infusions:  PRN Meds: acetaminophen **OR** acetaminophen, albuterol, alum & mag hydroxide-simeth, atropine, hydrOXYzine, ondansetron **OR** ondansetron (ZOFRAN) IV   Vital Signs    Vitals:   02/08/17 0055 02/08/17 0429 02/08/17 0500 02/08/17 0740  BP: 132/86 (!) 157/58  (!) 160/40  Pulse:  63  64  Resp: (!) 21 (!) 26  (!) 24  Temp: 98.2 F (36.8 C) 98.4 F (36.9 C)  98.2 F (36.8 C)  TempSrc: Oral Oral  Oral  SpO2: 98% 97%  97%  Weight:   188 lb 12.8 oz (85.6 kg)   Height:        Intake/Output Summary (Last 24 hours) at 02/08/17 0751 Last data filed at 02/08/17 0745  Gross per 24 hour  Intake              960 ml  Output             4025 ml  Net            -3065 ml   Filed Weights   02/06/17 0252 02/07/17 0400 02/08/17 0500  Weight: 194 lb 8 oz (88.2 kg) 195 lb 14.4 oz (88.9 kg) 188 lb 12.8 oz (85.6 kg)     Physical Exam   General: Well developed, well nourished, female appearing in no  acute distress. Head: Normocephalic, atraumatic.  Neck: Supple without bruits, no JVD Lungs:  Resp regular and unlabored, CTA. Heart: RRR, S1, S2, no S3, S4, 3/6 holosystolic murmur Abdomen: Soft, non-tender, non-distended with normoactive bowel sounds. No hepatomegaly. No rebound/guarding. No obvious abdominal masses. Extremities: No clubbing, cyanosis, no edema. Distal pedal pulses are 1+ bilaterally. Neuro: Alert and oriented X 3. Moves all extremities spontaneously. Psych: Normal affect.  Labs    Chemistry Recent Labs Lab 02/05/17 2134 02/06/17 0259  NA 132* 132*  K 5.2* 4.6  CL 98* 98*  CO2 26 27  GLUCOSE 131* 140*  BUN 20 19  CREATININE 0.79 0.78  CALCIUM 10.4* 9.9  PROT  --  6.5  ALBUMIN  --  3.4*  AST  --  23  ALT  --  17  ALKPHOS  --  58  BILITOT  --  0.6  GFRNONAA >60 >60  GFRAA >60 >60  ANIONGAP 8 7     Hematology Recent Labs Lab 02/05/17 2134 02/06/17 0259  WBC 5.2 4.6  RBC 4.36 4.00  HGB 12.9 12.2  HCT 39.1 36.1  MCV 89.7 90.3  MCH 29.6 30.5  MCHC 33.0 33.8  RDW 13.4 13.8  PLT 138* 113*    Cardiac Enzymes Recent Labs Lab 02/07/17 0104 02/07/17 0609 02/07/17 1158  TROPONINI <0.03 0.03* <0.03    Recent Labs Lab 02/05/17 2229  TROPIPOC 0.02     BNPNo results for input(s): BNP, PROBNP in the last 168 hours.   DDimer No results for input(s): DDIMER in the last 168 hours.   Radiology    Dg Chest Port 1 View  Result Date: 02/07/2017 CLINICAL DATA:  Respiratory distress. EXAM: PORTABLE CHEST 1 VIEW COMPARISON:  Chest radiograph February 05, 2017 FINDINGS: The cardiac silhouette is enlarged, increased from prior examination. Increasing interstitial prominence with blunting of the costophrenic angles. No pneumothorax. Surgical clips in the neck compatible with thyroidectomy. Bilateral shoulder arthroplasty. Pacer pad and leads overlie the chest. IMPRESSION: Increasing cardiomegaly with interstitial prominence concerning for pulmonary edema.  Trace suspected pleural effusions. Electronically Signed   By: Elon Alas M.D.   On: 02/07/2017 03:11     Telemetry    NSR in the 60s - Personally Reviewed  ECG    02/05/17: sinus bradycardia with RBBB - Personally Reviewed   Cardiac Studies   Echocardiogram 01/27/17: Study Conclusions - Left ventricle: The cavity size was normal. There was severe   focal basal hypertrophy of the septum. Systolic function was   vigorous. The estimated ejection fraction was in the range of 65%   to 70%. Wall motion was normal; there were no regional wall   motion abnormalities. Doppler parameters are consistent with   abnormal left ventricular relaxation (grade 1 diastolic   dysfunction). - Aortic valve: Trileaflet; moderately thickened, severely   calcified leaflets. Valve mobility was restricted. There was   moderate stenosis. There was mild regurgitation. Peak velocity   (S): 354 cm/s. Mean gradient (S): 27 mm Hg. - Mitral valve: There was mild regurgitation. - Left atrium: The atrium was mildly dilated. Volume/bsa, ES   (1-plane Simpson&'s, A4C): 35.2 ml/m^2. - Tricuspid valve: There was mild regurgitation. - Pulmonary arteries: Systolic pressure was mildly increased. PA   peak pressure: 42 mm Hg (S).  Impressions: - When compared to prior, aortic stenosis remains moderate.   Patient Profile     83 y.o. female admitted with abdominal pain and nausea Bradycardic AV nodal drugs held and HR improved. Moderate AS followed by Dr Tamala Julian  Assessment & Plan    1. Sinus bradycardia - telemetry with NSR in the 60s - continue to hold beta blocker (toprol 50 mg bid)   2. HTN - pressures have been elevated: sBP 130-170s - home norvasc at 10 mg daily - home irbesartan 150 mg BID - consider increasing irbesartan this afternoon if she is still hypertensive after her AM meds   3. Aortic stenosis - stable   4. Chronic diastolic dysfunction, grade 1 - she is diuresing on 20 mg IV  lasix BID - she is overall net negative 4.4L with 3.8 L urine output yesterday - weight is down 188 lbs from 194 on admission - weight at last clinic visit on 01/27/17 was 188lbs; she is near her dry weight - transition lasix to 20 mg PO BID today, K is stable   Signed, Ledora Bottcher , PA-C 7:51 AM 02/08/2017 Pager: [phone]  Attending Note:   The patient was seen and examined.  Agree with assessment and  plan as noted above.  Changes made to the above note as needed.  Patient seen and independently examined with Angie .   We discussed all aspects of the encounter. I agree with the assessment and plan as stated above.  1.  Bradycardia:   Has resolved after holding toprol 50 BID . HR is back up - infact was a little tachy last night. Discussed with Dr. Wendee Beavers. Will try Bystolic 5 mg a day which should better control her BP without lowering her HR too much  I think she could go home tomorrow if she does well  She should be seen in our office in a week for follow up of her HR and BP    I have spent a total of 30 minutes with patient reviewing hospital  notes , telemetry, EKGs, labs and examining patient as well as establishing an assessment and plan that was discussed with the patient. > 50% of time was spent in direct patient care.   Thayer Headings, Brooke Bonito., MD, [facility] 02/08/2017, 9:31 AM [address],  [place] Pager [phone]

## 2017-02-09 ENCOUNTER — Telehealth: Payer: Self-pay | Admitting: Interventional Cardiology

## 2017-02-09 LAB — GLUCOSE, CAPILLARY
Glucose-Capillary: 122 mg/dL — ABNORMAL HIGH (ref 65–99)
Glucose-Capillary: 185 mg/dL — ABNORMAL HIGH (ref 65–99)
Glucose-Capillary: 165 mg/dL — ABNORMAL HIGH (ref 65–99)
Glucose-Capillary: 125 mg/dL — ABNORMAL HIGH (ref 65–99)

## 2017-02-09 MED ORDER — NEBIVOLOL HCL 5 MG PO TABS: 2.5000 mg | ORAL_TABLET | Freq: Every day | ORAL | Status: DC

## 2017-02-09 NOTE — Telephone Encounter (Signed)
New message    TOC appt made on 02/16/17 at 230pm with Cecilie Kicks per Angie.

## 2017-02-09 NOTE — Telephone Encounter (Signed)
Per chart review, Pt remains hospitalized at this time.   Discharge pending.  Will continue to monitor for TCM follow up.

## 2017-02-09 NOTE — Progress Notes (Addendum)
Progress Note  Patient Name: Hailey Cobb Date of Encounter: 02/09/2017  Primary Cardiologist: Dr. Tamala Julian  Subjective   Patient is feeling well; denies chest pain, SOB, and palpitations. She gets blurry vision when her HR drops into the 40s.  Inpatient Medications    Scheduled Meds: . ALPRAZolam  0.5 mg Oral QHS  . amLODipine  10 mg Oral Daily  . aspirin EC  81 mg Oral Daily  . Chlorhexidine Gluconate Cloth  6 each Topical Q0600  . doxazosin  2 mg Oral Daily  . doxazosin  4 mg Oral QHS  . enoxaparin (LOVENOX) injection  40 mg Subcutaneous Q24H  . famotidine  20 mg Oral Daily  . furosemide  20 mg Oral Daily  . HYDROcodone-acetaminophen  1 tablet Oral QHS  . insulin aspart  0-15 Units Subcutaneous TID WC  . insulin aspart  0-5 Units Subcutaneous QHS  . irbesartan  150 mg Oral BID  . levETIRAcetam  500 mg Oral Daily  . levothyroxine  100 mcg Oral QAC breakfast  . mupirocin ointment  1 application Nasal BID  . nebivolol  5 mg Oral Daily  . pantoprazole  40 mg Oral Daily  . rosuvastatin  20 mg Oral Daily  . sodium chloride flush  3 mL Intravenous Q12H   Continuous Infusions:  PRN Meds: acetaminophen **OR** acetaminophen, albuterol, alum & mag hydroxide-simeth, atropine, HYDROcodone-acetaminophen, hydrOXYzine, ondansetron **OR** ondansetron (ZOFRAN) IV   Vital Signs    Vitals:   02/08/17 1600 02/08/17 1917 02/08/17 2020 02/09/17 0500  BP: (!) 152/70 (!) 146/58 (!) 161/75 (!) 156/58  Pulse: 65 (!) 50 (!) 53 61  Resp: 18 20 (!) 22 (!) 22  Temp: 98.6 F (37 C)  98 F (36.7 C) 97.9 F (36.6 C)  TempSrc: Oral  Oral Oral  SpO2: 96% 100% 100% 100%  Weight:    189 lb 4.8 oz (85.9 kg)  Height:        Intake/Output Summary (Last 24 hours) at 02/09/17 0829 Last data filed at 02/09/17 0658  Gross per 24 hour  Intake             1020 ml  Output             2650 ml  Net            -1630 ml   Filed Weights   02/07/17 0400 02/08/17 0500 02/09/17 0500  Weight: 195 lb  14.4 oz (88.9 kg) 188 lb 12.8 oz (85.6 kg) 189 lb 4.8 oz (85.9 kg)     Physical Exam   General: Well developed, well nourished, female appearing in no acute distress. Head: Normocephalic, atraumatic.  Neck: Supple without bruits, JVD. Lungs:  Resp regular and unlabored, CTA. Heart: Irregular rhythm, bradycardic rate, S1, S2, no S3, S4, 3/6 systolic murmur; no rub. Abdomen: Soft, non-tender, non-distended with normoactive bowel sounds. No hepatomegaly. No rebound/guarding. No obvious abdominal masses. Extremities: No clubbing, cyanosis, no edema. Distal pedal pulses are 1+ bilaterally. Neuro: Alert and oriented X 3. Moves all extremities spontaneously. Psych: Normal affect.  Labs    Chemistry Recent Labs Lab 02/05/17 2134 02/06/17 0259  NA 132* 132*  K 5.2* 4.6  CL 98* 98*  CO2 26 27  GLUCOSE 131* 140*  BUN 20 19  CREATININE 0.79 0.78  CALCIUM 10.4* 9.9  PROT  --  6.5  ALBUMIN  --  3.4*  AST  --  23  ALT  --  17  ALKPHOS  --  26  BILITOT  --  0.6  GFRNONAA >60 >60  GFRAA >60 >60  ANIONGAP 8 7     Hematology Recent Labs Lab 02/05/17 2134 02/06/17 0259  WBC 5.2 4.6  RBC 4.36 4.00  HGB 12.9 12.2  HCT 39.1 36.1  MCV 89.7 90.3  MCH 29.6 30.5  MCHC 33.0 33.8  RDW 13.4 13.8  PLT 138* 113*    Cardiac Enzymes Recent Labs Lab 02/07/17 0104 02/07/17 0609 02/07/17 1158  TROPONINI <0.03 0.03* <0.03    Recent Labs Lab 02/05/17 2229  TROPIPOC 0.02     BNPNo results for input(s): BNP, PROBNP in the last 168 hours.   DDimer No results for input(s): DDIMER in the last 168 hours.   Radiology    No results found.   Telemetry    Sinus bradycardia - Personally Reviewed  ECG    No new tracings - Personally Reviewed   Cardiac Studies   Echocardiogram 01/27/17: Study Conclusions - Left ventricle: The cavity size was normal. There was severe focal basal hypertrophy of the septum. Systolic function was vigorous. The estimated ejection fraction  was in the range of 65% to 70%. Wall motion was normal; there were no regional wall motion abnormalities. Doppler parameters are consistent with abnormal left ventricular relaxation (grade 1 diastolic dysfunction). - Aortic valve: Trileaflet; moderately thickened, severely calcified leaflets. Valve mobility was restricted. There was moderate stenosis. There was mild regurgitation. Peak velocity (S): 354 cm/s. Mean gradient (S): 27 mm Hg. - Mitral valve: There was mild regurgitation. - Left atrium: The atrium was mildly dilated. Volume/bsa, ES (1-plane Simpson&'s, A4C): 35.2 ml/m^2. - Tricuspid valve: There was mild regurgitation. - Pulmonary arteries: Systolic pressure was mildly increased. PA peak pressure: 42 mm Hg (S).  Impressions: - When compared to prior, aortic stenosis remains moderate.   Patient Profile     83 y.o. female admitted with abdominal pain and nausea Bradycardic AV nodal drugs held and HR improved. Moderate AS followed by Dr Tamala Julian  Assessment & Plan    1. Sinus bradycardia - per telemetry,  - started 5 mg bystolic yesterday - HR in the 40-50s with sBP 140-160s - pt states that last evening when her HR was in the 40s (confirmed on telemetry) she had blurry vision that resolved when her heart rate improved/increased - will discuss with attending decreasing bystolic to 2.5 mg vs D/C    2. HTN - she remains hypertensive on norvasc 10 mg, irbesartan 150 mg BID, and bystolic 5 mg - increase irbesartan to 300 mg BID   3. Aortic stenosis - stable   4. Chronic diastolic dysfunction grade 1 - lasix transitioned to PO dosing yesterday - OK to decrease to 20 mg daily today, which is increased from her home dose of 10 mg - she is overall net negative 6L with 2.9 L urine output yesterday - weight is 189 lbs, which is near her dry weight and down from her admission weight of 194 lbs. Her dry weight is likely near 188 lbs    Signed, Ledora Bottcher , PA-C 8:29 AM 02/09/2017 Pager: [phone]   Attending Note:   The patient was seen and examined.  Agree with assessment and plan as noted above.  Changes made to the above note as needed.  Patient seen and independently examined with Doreene Adas, PA .   We discussed all aspects of the encounter. I agree with the assessment and plan as stated above.  1.   Bradycardia:  She had bradycardia with bystolic 5 mg a day .    Options:    1.  Allow her to go home today without any BB or CCB and have her follow up with [place] in the clinic.      2.  Try bystolic 2.5 mg today and see how she does. She would like to discuss with her daughter and decide later today    She may ultimately need a pacer for back up pacing which would allow [place] to treat her tachycardia  Effectively    I have spent a total of 30 minutes with patient reviewing hospital  notes , telemetry, EKGs, labs and examining patient as well as establishing an assessment and plan that was discussed with the patient. > 50% of time was spent in direct patient care.   Thayer Headings, Brooke Bonito., MD, [facility] 02/09/2017, 9:08 AM [address]  [place] Pager [phone]

## 2017-02-09 NOTE — Plan of Care (Signed)
Problem: Safety: Goal: Ability to remain free from injury will improve Outcome: Progressing Bedside commode at bedside.  Pt consistently uses call light for assistance out of bed.  Personal items and call bell within reach.  Problem: Pain Managment: Goal: General experience of comfort will improve Outcome: Progressing Home pain med and anti anxiety med ordered and given per home regimen.  Pt also given hot packs for arthritis pain in neck and hip.  Problem: Fluid Volume: Goal: Ability to maintain a balanced intake and output will improve Outcome: Progressing Pt diuresing well; IV lasix changed to po.

## 2017-02-09 NOTE — Progress Notes (Signed)
PROGRESS NOTE    Hailey Cobb  HUD:[id number] DOB: 13-Dec-1931 DOA: 02/05/2017 PCP: Hulan Fess, MD   Brief Narrative:  Patient is an 83 year old with medical history significant for hypertension, diabetes mellitus type 2, hypothyroidism, SDH who presented complaining of lightheadedness and nausea and found to have slow heart rates.  Cardiology consulted and assisted with management. Patient was placed on Bystolic and she felt that with activity she had palpitations. Bystolic was added and patient was observed but she developed bradycardia.  Assessment & Plan:   Principal Problem:   Symptomatic bradycardia - Cardiology consulted and assisting and medical decision making. - Patient was placed on Bystolic secondary to perceived palpitations with activity. When patient's heart rate is low enough she complains of blurry vision for which she was complaining of today. I suggested monitoring for another day. After discussion with patient we'll try low-dose Bystolic starting tomorrow with holding parameters. Should patient continue to have symptomatic bradycardia despite lowest dose Bystolic would not recommend continuing beta blocker.   CHF - Continue current medication regimen - Cardiology on board -Compensated patient does not seem overload  Active Problems:   Essential hypertension - Will continue amlodipine, Avapro, Bystolic    Diabetes mellitus type 2, controlled ([place]) - Continue carb modified diet. Plan is to continue sliding scale insulin    Dehydration - Resolved    Hyperkalemia -  resolved    Hypercalcemia - Resolved   DVT prophylaxis: Lovenox Code Status: Full Family Communication: No family at bedside patient did not request any family updates Disposition Plan: Consider discharge next a.m. if improved condition   Consultants:   Cardiology   Procedures: None   Antimicrobials: None   Subjective: The patient reports some blurred vision which she gets with  bradycardia.  Objective: Vitals:   02/08/17 1917 02/08/17 2020 02/09/17 0500 02/09/17 1411  BP: (!) 146/58 (!) 161/75 (!) 156/58 (!) 135/54  Pulse: (!) 50 (!) 53 61 (!) 55  Resp: 20 (!) 22 (!) 22 (!) 22  Temp:  98 F (36.7 C) 97.9 F (36.6 C) 98.4 F (36.9 C)  TempSrc:  Oral Oral Oral  SpO2: 100% 100% 100% 94%  Weight:   85.9 kg (189 lb 4.8 oz)   Height:        Intake/Output Summary (Last 24 hours) at 02/09/17 1952 Last data filed at 02/09/17 1900  Gross per 24 hour  Intake              960 ml  Output             2750 ml  Net            -1790 ml   Filed Weights   02/07/17 0400 02/08/17 0500 02/09/17 0500  Weight: 88.9 kg (195 lb 14.4 oz) 85.6 kg (188 lb 12.8 oz) 85.9 kg (189 lb 4.8 oz)    Examination:  General exam: Patient is in no acute distress, alert and awake Respiratory system: Equal chest rise, no wheezes, no increased work of breathing Cardiovascular system: Regular rate and rhythm, S 1 -S2 within normal limits Gastrointestinal system: Abdomen is nondistended, soft and nontender. No organomegaly or masses felt. Normal bowel sounds heard. Central nervous system: No focal neurological deficits, Alert and oriented.  Extremities: Symmetric 5 x 5 power. Warm Skin: No rashes, lesions or ulcers, on limited exam. Psychiatry:  Mood & affect appropriate.   Data Reviewed: I have personally reviewed following labs and imaging studies  CBC:  Recent Labs Lab 02/05/17  2134 02/06/17 0259  WBC 5.2 4.6  HGB 12.9 12.2  HCT 39.1 36.1  MCV 89.7 90.3  PLT 138* 016*   Basic Metabolic Panel:  Recent Labs Lab 02/05/17 2134 02/06/17 0259  NA 132* 132*  K 5.2* 4.6  CL 98* 98*  CO2 26 27  GLUCOSE 131* 140*  BUN 20 19  CREATININE 0.79 0.78  CALCIUM 10.4* 9.9  MG  --  1.6*   GFR: Estimated Creatinine Clearance: 57.5 mL/min (by C-G formula based on SCr of 0.78 mg/dL). Liver Function Tests:  Recent Labs Lab 02/06/17 0259  AST 23  ALT 17  ALKPHOS 58  BILITOT  0.6  PROT 6.5  ALBUMIN 3.4*    Recent Labs Lab 02/06/17 0259  LIPASE 28   No results for input(s): AMMONIA in the last 168 hours. Coagulation Profile: No results for input(s): INR, PROTIME in the last 168 hours. Cardiac Enzymes:  Recent Labs Lab 02/07/17 0104 02/07/17 0609 02/07/17 1158  TROPONINI <0.03 0.03* <0.03   BNP (last 3 results) No results for input(s): PROBNP in the last 8760 hours. HbA1C: No results for input(s): HGBA1C in the last 72 hours. CBG:  Recent Labs Lab 02/08/17 1644 02/08/17 2126 02/09/17 0742 02/09/17 1121 02/09/17 1638  GLUCAP 192* 126* 122* 185* 165*   Lipid Profile: No results for input(s): CHOL, HDL, LDLCALC, TRIG, CHOLHDL, LDLDIRECT in the last 72 hours. Thyroid Function Tests: No results for input(s): TSH, T4TOTAL, FREET4, T3FREE, THYROIDAB in the last 72 hours. Anemia Panel: No results for input(s): VITAMINB12, FOLATE, FERRITIN, TIBC, IRON, RETICCTPCT in the last 72 hours. Sepsis Labs: No results for input(s): PROCALCITON, LATICACIDVEN in the last 168 hours.  Recent Results (from the past 240 hour(s))  MRSA PCR Screening     Status: Abnormal   Collection Time: 02/06/17  3:03 AM  Result Value Ref Range Status   MRSA by PCR POSITIVE (A) NEGATIVE Final    Comment:        The GeneXpert MRSA Assay (FDA approved for NASAL specimens only), is one component of a comprehensive MRSA colonization surveillance program. It is not intended to diagnose MRSA infection nor to guide or monitor treatment for MRSA infections. RESULT CALLED TO, READ BACK BY AND VERIFIED WITH: Murvin Natal [phone] 06.23.2018 N. MORRIS          Radiology Studies: No results found.   Scheduled Meds: . ALPRAZolam  0.5 mg Oral QHS  . amLODipine  10 mg Oral Daily  . aspirin EC  81 mg Oral Daily  . Chlorhexidine Gluconate Cloth  6 each Topical Q0600  . doxazosin  2 mg Oral Daily  . doxazosin  4 mg Oral QHS  . enoxaparin (LOVENOX) injection  40 mg  Subcutaneous Q24H  . famotidine  20 mg Oral Daily  . furosemide  20 mg Oral Daily  . HYDROcodone-acetaminophen  1 tablet Oral QHS  . insulin aspart  0-15 Units Subcutaneous TID WC  . insulin aspart  0-5 Units Subcutaneous QHS  . irbesartan  150 mg Oral BID  . levETIRAcetam  500 mg Oral Daily  . levothyroxine  100 mcg Oral QAC breakfast  . mupirocin ointment  1 application Nasal BID  . pantoprazole  40 mg Oral Daily  . rosuvastatin  20 mg Oral Daily  . sodium chloride flush  3 mL Intravenous Q12H   Continuous Infusions:   LOS: 3 days    Time spent: > 35 minutes  Velvet Bathe, MD [facility] Pager [phone]  If 7PM-7AM, please contact night-coverage [email or web address] Password [facility] 02/09/2017, 7:52 PM

## 2017-02-10 LAB — GLUCOSE, CAPILLARY
Glucose-Capillary: 221 mg/dL — ABNORMAL HIGH (ref 65–99)
Glucose-Capillary: 135 mg/dL — ABNORMAL HIGH (ref 65–99)

## 2017-02-10 LAB — BASIC METABOLIC PANEL
Anion gap: 6 (ref 5–15)
BUN: 13 mg/dL (ref 6–20)
CO2: 29 mmol/L (ref 22–32)
Calcium: 9.6 mg/dL (ref 8.9–10.3)
Chloride: 98 mmol/L — ABNORMAL LOW (ref 101–111)
Creatinine, Ser: 0.66 mg/dL (ref 0.44–1.00)
GFR calc Af Amer: >60 mL/min (ref >60)
GFR calc non Af Amer: >60 mL/min (ref >60)
Glucose, Bld: 145 mg/dL — ABNORMAL HIGH (ref 65–99)
Potassium: 4.2 mmol/L (ref 3.5–5.1)
Sodium: 133 mmol/L — ABNORMAL LOW (ref 135–145)

## 2017-02-10 MED ORDER — DOXAZOSIN MESYLATE 2 MG PO TABS: 2.0000 mg | ORAL_TABLET | Freq: Every day | ORAL | 0 refills | Status: DC

## 2017-02-10 MED ORDER — DOXAZOSIN MESYLATE 8 MG PO TABS: 8.0000 mg | ORAL_TABLET | Freq: Every day | ORAL | Status: DC

## 2017-02-10 MED ORDER — DOXAZOSIN MESYLATE 8 MG PO TABS: 8.0000 mg | ORAL_TABLET | Freq: Every day | ORAL | 0 refills | Status: DC

## 2017-02-10 NOTE — Progress Notes (Signed)
Progress Note  Patient Name: Hailey Cobb Date of Encounter: 02/10/2017  Primary Cardiologist: Dr. Tamala Julian  Subjective   Patient is feeling well; denies chest pain, SOB, and palpitations. She gets blurry vision when her HR drops into the 40s.  She seems to have symptoms - even when her HR is in the normal range.   Inpatient Medications    Scheduled Meds: . ALPRAZolam  0.5 mg Oral QHS  . amLODipine  10 mg Oral Daily  . aspirin EC  81 mg Oral Daily  . Chlorhexidine Gluconate Cloth  6 each Topical Q0600  . doxazosin  2 mg Oral Daily  . doxazosin  4 mg Oral QHS  . enoxaparin (LOVENOX) injection  40 mg Subcutaneous Q24H  . famotidine  20 mg Oral Daily  . furosemide  20 mg Oral Daily  . HYDROcodone-acetaminophen  1 tablet Oral QHS  . insulin aspart  0-15 Units Subcutaneous TID WC  . insulin aspart  0-5 Units Subcutaneous QHS  . irbesartan  150 mg Oral BID  . levETIRAcetam  500 mg Oral Daily  . levothyroxine  100 mcg Oral QAC breakfast  . mupirocin ointment  1 application Nasal BID  . pantoprazole  40 mg Oral Daily  . rosuvastatin  20 mg Oral Daily  . sodium chloride flush  3 mL Intravenous Q12H   Continuous Infusions:  PRN Meds: acetaminophen **OR** acetaminophen, albuterol, alum & mag hydroxide-simeth, atropine, HYDROcodone-acetaminophen, hydrOXYzine, ondansetron **OR** ondansetron (ZOFRAN) IV   Vital Signs    Vitals:   02/09/17 0500 02/09/17 1411 02/09/17 2211 02/10/17 0458  BP: (!) 156/58 (!) 135/54 (!) 149/71 (!) 152/67  Pulse: 61 (!) 55 (!) 53 78  Resp: (!) 22 (!) 22 20 14   Temp: 97.9 F (36.6 C) 98.4 F (36.9 C) 98.7 F (37.1 C) 97.8 F (36.6 C)  TempSrc: Oral Oral Oral Oral  SpO2: 100% 94% 95% 93%  Weight: 189 lb 4.8 oz (85.9 kg)   191 lb (86.6 kg)  Height:        Intake/Output Summary (Last 24 hours) at 02/10/17 0904 Last data filed at 02/10/17 0459  Gross per 24 hour  Intake              240 ml  Output             2800 ml  Net            -2560 ml    Filed Weights   02/08/17 0500 02/09/17 0500 02/10/17 0458  Weight: 188 lb 12.8 oz (85.6 kg) 189 lb 4.8 oz (85.9 kg) 191 lb (86.6 kg)     Physical Exam   General: Well developed, well nourished, female appearing in no acute distress. Head: Normocephalic, atraumatic.  Neck: Supple without bruits, JVD. Lungs:  Resp regular and unlabored, CTA. Heart: Irregular rhythm, bradycardic rate, S1, S2, no S3, S4, 3/6 systolic murmur; no rub. Abdomen: Soft, non-tender, non-distended with normoactive bowel sounds. No hepatomegaly. No rebound/guarding. No obvious abdominal masses. Extremities: No clubbing, cyanosis, no edema. Distal pedal pulses are 1+ bilaterally. Neuro: Alert and oriented X 3. Moves all extremities spontaneously. Psych: Normal affect.  Labs    Chemistry  Recent Labs Lab 02/05/17 2134 02/06/17 0259 02/10/17 0525  NA 132* 132* 133*  K 5.2* 4.6 4.2  CL 98* 98* 98*  CO2 26 27 29   GLUCOSE 131* 140* 145*  BUN 20 19 13   CREATININE 0.79 0.78 0.66  CALCIUM 10.4* 9.9 9.6  PROT  --  6.5  --   ALBUMIN  --  3.4*  --   AST  --  23  --   ALT  --  17  --   ALKPHOS  --  58  --   BILITOT  --  0.6  --   GFRNONAA >60 >60 >60  GFRAA >60 >60 >60  ANIONGAP 8 7 6      Hematology  Recent Labs Lab 02/05/17 2134 02/06/17 0259  WBC 5.2 4.6  RBC 4.36 4.00  HGB 12.9 12.2  HCT 39.1 36.1  MCV 89.7 90.3  MCH 29.6 30.5  MCHC 33.0 33.8  RDW 13.4 13.8  PLT 138* 113*    Cardiac Enzymes  Recent Labs Lab 02/07/17 0104 02/07/17 0609 02/07/17 1158  TROPONINI <0.03 0.03* <0.03     Recent Labs Lab 02/05/17 2229  TROPIPOC 0.02     BNPNo results for input(s): BNP, PROBNP in the last 168 hours.   DDimer No results for input(s): DDIMER in the last 168 hours.   Radiology    No results found.   Telemetry    Sinus bradycardia - Personally Reviewed  ECG    No new tracings - Personally Reviewed   Cardiac Studies   Echocardiogram 01/27/17: Study Conclusions - Left  ventricle: The cavity size was normal. There was severe focal basal hypertrophy of the septum. Systolic function was vigorous. The estimated ejection fraction was in the range of 65% to 70%. Wall motion was normal; there were no regional wall motion abnormalities. Doppler parameters are consistent with abnormal left ventricular relaxation (grade 1 diastolic dysfunction). - Aortic valve: Trileaflet; moderately thickened, severely calcified leaflets. Valve mobility was restricted. There was moderate stenosis. There was mild regurgitation. Peak velocity (S): 354 cm/s. Mean gradient (S): 27 mm Hg. - Mitral valve: There was mild regurgitation. - Left atrium: The atrium was mildly dilated. Volume/bsa, ES (1-plane Simpson&'s, A4C): 35.2 ml/m^2. - Tricuspid valve: There was mild regurgitation. - Pulmonary arteries: Systolic pressure was mildly increased. PA peak pressure: 42 mm Hg (S).  Impressions: - When compared to prior, aortic stenosis remains moderate.   Patient Profile     83 y.o. female admitted with abdominal pain and nausea Bradycardic AV nodal drugs held and HR improved. Moderate AS followed by Dr Tamala Julian  Assessment & Plan    1. Sinus bradycardia We tried low dose Bystolic but she did not tolerate that. Will DC her on no Beta blocker at this point Will follow clinicaly    2. HTN - she remains hypertensive on norvasc 10 mg, irbesartan 150 mg BID,  Cardura 4 mg QHS.   Ive increased the Cardura to 8 mg at bedtime    3. Aortic stenosis - stable   4. Chronic diastolic dysfunction grade 1 - lasix transitioned to PO dosing yesterday

## 2017-02-10 NOTE — Consult Note (Signed)
[facility] CM Primary Care Navigator  02/10/2017  Hailey Cobb 08/20/1931 [id number]    Met with patient at the bedside to identify possible discharge needs. Patient reports having worsening dizziness, nausea and weakness that led to this admission.  Patient endorses Dr. Kevin Little with [facility] [employer] as herprimary care provider.  Patient shared using [facility] and [facility] at [address] to obtain medications without difficulty.  Patientreports that she manages her medications at home with use of "pill box" system filled weekly.  Patient was driving prior to admission, however, daughter can providetransportation to herdoctors'appointments when needed after discharge.  Patient is the caregiver for her husband (with dementia and cancer) prior to admission. Daughter (Bonnie) will be the caregiver for both. Patient refused [facility] list for personal care services offered to her and verbalized that daughter is in the process of getting aide[facility]giver [facility] to assist with patient and husband's care at home.  Anticipated discharge plan is home according to patient.  Patient declined offer for EMMI General Calls to follow-up with her recovery at home and states that daughter will do that for her.  Explained to patient about other [facility] CM services available for healthmanagement but patient mentioned that she attended diabetes classes before and is aware of ways in managing diabetes.  Patient expressed understanding to request a referral from primary care provider to [facility] care management if services are deemed necessary in the future.  [facility] care management information provided for future needs that may arise.   For questions, please contact:  Lorraine Ajel, BSN, RN- BC Primary Care Navigator  Telephone: [phone] [facility] 

## 2017-02-10 NOTE — Discharge Summary (Signed)
Physician Discharge Summary  Hailey Cobb YJE:[id number] DOB: [birth year] DOA: 02/05/2017  PCP: Hulan Fess, MD  Admit date: 02/05/2017 Discharge date: 02/10/2017  Time spent: > 35 minutes  Recommendations for Outpatient Follow-up:  1.    Discharge Diagnoses:  Principal Problem:   Symptomatic bradycardia Active Problems:   Essential hypertension   Diabetes mellitus type 2, controlled ([place])   Dehydration   Hyperkalemia   Hypercalcemia   Discharge Condition: stable  Diet recommendation: carb modified diet.  Filed Weights   02/08/17 0500 02/09/17 0500 02/10/17 0458  Weight: 85.6 kg (188 lb 12.8 oz) 85.9 kg (189 lb 4.8 oz) 86.6 kg (191 lb)    History of present illness:  Patient is an 83 year old with medical history significant for hypertension, diabetes mellitus type 2, hypothyroidism, SDH who presented complaining of lightheadedness and nausea and found to have slow heart rates.  Cardiology consulted and assisted with management. Patient was placed on Bystolic and she felt that with activity she had palpitations. Bystolic was added and patient was observed but she developed bradycardia.  Hospital Course:  Principal Problem:   Symptomatic bradycardia - Cardiology consulted and assisting and medical decision making. - Patient was started on low-dose Bystolic for perceived palpitations. But continued to have symptomatic bradycardia on beta blocker. As such off beta blocker therapies will be held on discharge. Patient can follow-up with cardiology as outpatient for further evaluation recommendations. Patient was asymptomatic off of beta blocker therapy.  CHF - cardiology assisted while patient in house. - continue lasix 20 mg po daily -Compensated patient does not seem overload  Active Problems:   Essential hypertension - Will continue amlodipine, Avapro, Cardura dose increased for better blood pressure control avoiding beta blockers secondary to principal problem   Diabetes mellitus type 2, controlled ([facility]) - Continue carb modified diet.  - We'll continue prior to admission medication regimen serum creatinine 0.6    Dehydration - Resolved    Hyperkalemia -  resolved    Hypercalcemia - Resolved   Procedures:  None  Consultations:  Cardiology  Discharge Exam: Vitals:   02/09/17 2211 02/10/17 0458  BP: (!) 149/71 (!) 152/67  Pulse: (!) 53 78  Resp: 20 14  Temp: 98.7 F (37.1 C) 97.8 F (36.6 C)    General: Patient in no acute distress, alert and awake Cardiovascular: Regular rate and rhythm, no murmurs or rubs Respiratory: No increased work of breathing, no wheezes  Discharge Instructions   Discharge Instructions    Call MD for:  extreme fatigue    Complete by:  As directed    Call MD for:  severe uncontrolled pain    Complete by:  As directed    Diet - low sodium heart healthy    Complete by:  As directed    Increase activity slowly    Complete by:  As directed      Current Discharge Medication List    CONTINUE these medications which have CHANGED   Details  !! doxazosin (CARDURA) 2 MG tablet Take 1 tablet (2 mg total) by mouth daily. Qty: 30 tablet, Refills: 0    !! doxazosin (CARDURA) 8 MG tablet Take 1 tablet (8 mg total) by mouth at bedtime. Qty: 30 tablet, Refills: 0     !! - Potential duplicate medications found. Please discuss with provider.    CONTINUE these medications which have NOT CHANGED   Details  ALPRAZolam (XANAX) 0.5 MG tablet Take 0.5 mg by mouth at bedtime.     amLODipine (  NORVASC) 10 MG tablet Take 10 mg by mouth daily.    aspirin EC 81 MG tablet Take 81 mg by mouth daily.    ciclopirox (LOPROX) 0.77 % cream Apply 1 application topically daily as needed (RASH).     esomeprazole (NEXIUM) 40 MG capsule Take 40 mg by mouth daily as needed (ACID REFLUX).     furosemide (LASIX) 40 MG tablet Take 20 mg by mouth daily.    HYDROcodone-acetaminophen (NORCO) 5-325 MG tablet Take 1 tablet  by mouth See admin instructions. Take 1 tablet every night at bedtime, can take one during the day as needed for pain    irbesartan (AVAPRO) 150 MG tablet Take 150 mg by mouth 2 (two) times daily.    levETIRAcetam (KEPPRA) 500 MG tablet Take 500 mg by mouth every morning.     levothyroxine (SYNTHROID, LEVOTHROID) 100 MCG tablet Take 100 mcg by mouth every morning. Refills: 2    metFORMIN (GLUCOPHAGE) 1000 MG tablet Take 1,000 mg by mouth 2 (two) times daily with a meal.    Multiple Vitamins-Minerals (VISION VITAMINS PO) Take 1 tablet by mouth daily.    rosuvastatin (CRESTOR) 20 MG tablet Take 20 mg by mouth daily.    sitaGLIPtin (JANUVIA) 100 MG tablet Take 100 mg by mouth daily.    hydrOXYzine (ATARAX/VISTARIL) 25 MG tablet Take 25 mg by mouth daily as needed for itching.       STOP taking these medications     azelastine (ASTELIN) 137 MCG/SPRAY nasal spray      Liver Extract (LIVER PO)      metoprolol succinate (TOPROL-XL) 50 MG 24 hr tablet      Zinc 50 MG TABS        Allergies  Allergen Reactions  . Clonidine Derivatives Other (See Comments)    Dizzy  . Glimepiride Other (See Comments)    Hypoglycemia  . Invokana [Canagliflozin] Other (See Comments)    Weakness, perineal irritation   Follow-up Information    Isaiah Serge, NP Follow up on 02/16/2017.   Specialties:  Cardiology, Radiology Why:  2:30 pm for [employer] hospital follow up Contact information: [place] [address] [phone]            The results of significant diagnostics from this hospitalization (including imaging, microbiology, ancillary and laboratory) are listed below for reference.    Significant Diagnostic Studies: Dg Chest 2 View  Result Date: 02/05/2017 CLINICAL DATA:  Bradycardia, dizziness and nausea. History of hypertension, diabetes, heart murmur. EXAM: CHEST  2 VIEW COMPARISON:  Chest radiograph November 08, 2015 FINDINGS: The cardiac silhouette is mildly  enlarged. Tortuous calcified aorta. Mild chronic interstitial changes. Slight blunting of the LEFT costophrenic angle. No pneumothorax. Surgical clips in the neck compatible with thyroidectomy. Status post bilateral shoulder arthroplasties. IMPRESSION: Mild cardiomegaly and mild chronic interstitial changes. Trace LEFT pleural effusion versus pleural thickening. Electronically Signed   By: Elon Alas M.D.   On: 02/05/2017 22:46   Dg Chest Port 1 View  Result Date: 02/07/2017 CLINICAL DATA:  Respiratory distress. EXAM: PORTABLE CHEST 1 VIEW COMPARISON:  Chest radiograph February 05, 2017 FINDINGS: The cardiac silhouette is enlarged, increased from prior examination. Increasing interstitial prominence with blunting of the costophrenic angles. No pneumothorax. Surgical clips in the neck compatible with thyroidectomy. Bilateral shoulder arthroplasty. Pacer pad and leads overlie the chest. IMPRESSION: Increasing cardiomegaly with interstitial prominence concerning for pulmonary edema. Trace suspected pleural effusions. Electronically Signed   By: Sandie Ano  Bloomer M.D.   On: 02/07/2017 03:11    Microbiology: Recent Results (from the past 240 hour(s))  MRSA PCR Screening     Status: Abnormal   Collection Time: 02/06/17  3:03 AM  Result Value Ref Range Status   MRSA by PCR POSITIVE (A) NEGATIVE Final    Comment:        The GeneXpert MRSA Assay (FDA approved for NASAL specimens only), is one component of a comprehensive MRSA colonization surveillance program. It is not intended to diagnose MRSA infection nor to guide or monitor treatment for MRSA infections. RESULT CALLED TO, READ BACK BY AND VERIFIED WITH: Murvin Natal [phone] 06.23.2018 N. MORRIS      Labs: Basic Metabolic Panel:  Recent Labs Lab 02/05/17 2134 02/06/17 0259 02/10/17 0525  NA 132* 132* 133*  K 5.2* 4.6 4.2  CL 98* 98* 98*  CO2 26 27 29   GLUCOSE 131* 140* 145*  BUN 20 19 13   CREATININE 0.79 0.78 0.66  CALCIUM 10.4*  9.9 9.6  MG  --  1.6*  --    Liver Function Tests:  Recent Labs Lab 02/06/17 0259  AST 23  ALT 17  ALKPHOS 58  BILITOT 0.6  PROT 6.5  ALBUMIN 3.4*    Recent Labs Lab 02/06/17 0259  LIPASE 28   No results for input(s): AMMONIA in the last 168 hours. CBC:  Recent Labs Lab 02/05/17 2134 02/06/17 0259  WBC 5.2 4.6  HGB 12.9 12.2  HCT 39.1 36.1  MCV 89.7 90.3  PLT 138* 113*   Cardiac Enzymes:  Recent Labs Lab 02/07/17 0104 02/07/17 0609 02/07/17 1158  TROPONINI <0.03 0.03* <0.03   BNP: BNP (last 3 results) No results for input(s): BNP in the last 8760 hours.  ProBNP (last 3 results) No results for input(s): PROBNP in the last 8760 hours.  CBG:  Recent Labs Lab 02/09/17 1121 02/09/17 1638 02/09/17 2203 02/10/17 0806 02/10/17 1230  GLUCAP 185* 165* 125* 221* 135*    Signed:  Velvet Bathe MD.  [facility] 02/10/2017, 1:00 PM

## 2017-02-11 NOTE — Telephone Encounter (Signed)
Patient contacted regarding discharge from [facility] on 02/10/2017 Patient understands to follow up with provider Cecilie Kicks 02/16/2017 @ 1430 at [place] [facility]. Patient understands discharge instructions? yes Patient understands medications and regiment? yes Patient understands to bring all medications to this visit? yes  Pt states she is doing pretty good.  Asked if Pt had been checking her BP and HR.  Pt states her AM BP was 141/77 with HR of 82.  Spoke with Pt about her discontinued meds.  Pt questioned her nasal spray that was dc'ed.  Told Pt to bring it with her to her next appt and ask the doctor about it if it's something she feels she needs.  Pt indicates understanding.  Pt denies any further needs.

## 2017-02-11 NOTE — Telephone Encounter (Signed)
Call placed to Pt for TCM follow up.  Call went to VM.  Left generic message requesting call back.  This nurse name and # left.  Will cont to monitor.

## 2017-02-16 ENCOUNTER — Ambulatory Visit (INDEPENDENT_AMBULATORY_CARE_PROVIDER_SITE_OTHER): Payer: Medicare Other | Admitting: Cardiology

## 2017-02-16 ENCOUNTER — Encounter (INDEPENDENT_AMBULATORY_CARE_PROVIDER_SITE_OTHER): Payer: Self-pay

## 2017-02-16 ENCOUNTER — Encounter: Payer: Self-pay | Admitting: Cardiology

## 2017-02-16 VITALS — BP 122/64 | HR 88 | Ht 64.0 in | Wt 190.0 lb

## 2017-02-16 DIAGNOSIS — I10 Essential (primary) hypertension: Secondary | ICD-10-CM

## 2017-02-16 DIAGNOSIS — E1159 Type 2 diabetes mellitus with other circulatory complications: Secondary | ICD-10-CM

## 2017-02-16 DIAGNOSIS — I4510 Unspecified right bundle-branch block: Secondary | ICD-10-CM

## 2017-02-16 DIAGNOSIS — I350 Nonrheumatic aortic (valve) stenosis: Secondary | ICD-10-CM

## 2017-02-16 DIAGNOSIS — I495 Sick sinus syndrome: Secondary | ICD-10-CM | POA: Diagnosis not present

## 2017-02-16 NOTE — Patient Instructions (Signed)
Medication Instructions:  Your physician recommends that you continue on your current medications as directed. Please refer to the Current Medication list given to you today.   Labwork: None Ordered   Testing/Procedures: Your physician has recommended that you wear a holter monitor. Holter monitors are medical devices that record the heart's electrical activity. Doctors most often use these monitors to diagnose arrhythmias. Arrhythmias are problems with the speed or rhythm of the heartbeat. The monitor is a small, portable device. You can wear one while you do your normal daily activities. This is usually used to diagnose what is causing palpitations/syncope (passing out).    Follow-Up: Your physician recommends that you schedule a follow-up appointment in: 2-3 weeks with Cecilie Kicks, NP  Any Other Special Instructions Will Be Listed Below (If Applicable).  Please call if blood pressure gets too low.    If you need a refill on your cardiac medications before your next appointment, please call your pharmacy.
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
## 2017-02-18 DIAGNOSIS — R001 Bradycardia, unspecified: Secondary | ICD-10-CM | POA: Diagnosis not present

## 2017-02-18 DIAGNOSIS — Z6833 Body mass index (BMI) 33.0-33.9, adult: Secondary | ICD-10-CM | POA: Diagnosis not present

## 2017-02-18 DIAGNOSIS — I10 Essential (primary) hypertension: Secondary | ICD-10-CM | POA: Diagnosis not present

## 2017-02-18 DIAGNOSIS — E871 Hypo-osmolality and hyponatremia: Secondary | ICD-10-CM | POA: Diagnosis not present

## 2017-02-18 DIAGNOSIS — Z87898 Personal history of other specified conditions: Secondary | ICD-10-CM | POA: Diagnosis not present

## 2017-02-18 DIAGNOSIS — E669 Obesity, unspecified: Secondary | ICD-10-CM | POA: Diagnosis not present

## 2017-03-01 ENCOUNTER — Ambulatory Visit (INDEPENDENT_AMBULATORY_CARE_PROVIDER_SITE_OTHER): Payer: Medicare Other

## 2017-03-01 DIAGNOSIS — I495 Sick sinus syndrome: Secondary | ICD-10-CM | POA: Diagnosis not present

## 2017-03-02 DIAGNOSIS — H43813 Vitreous degeneration, bilateral: Secondary | ICD-10-CM | POA: Diagnosis not present

## 2017-03-02 DIAGNOSIS — E113291 Type 2 diabetes mellitus with mild nonproliferative diabetic retinopathy without macular edema, right eye: Secondary | ICD-10-CM | POA: Diagnosis not present

## 2017-03-02 DIAGNOSIS — E113212 Type 2 diabetes mellitus with mild nonproliferative diabetic retinopathy with macular edema, left eye: Secondary | ICD-10-CM | POA: Diagnosis not present

## 2017-03-02 DIAGNOSIS — H15833 Staphyloma posticum, bilateral: Secondary | ICD-10-CM | POA: Diagnosis not present

## 2017-03-08 DIAGNOSIS — M9903 Segmental and somatic dysfunction of lumbar region: Secondary | ICD-10-CM | POA: Diagnosis not present

## 2017-03-08 DIAGNOSIS — M9904 Segmental and somatic dysfunction of sacral region: Secondary | ICD-10-CM | POA: Diagnosis not present

## 2017-03-08 DIAGNOSIS — M9905 Segmental and somatic dysfunction of pelvic region: Secondary | ICD-10-CM | POA: Diagnosis not present

## 2017-03-08 DIAGNOSIS — M5136 Other intervertebral disc degeneration, lumbar region: Secondary | ICD-10-CM | POA: Diagnosis not present

## 2017-03-11 ENCOUNTER — Ambulatory Visit (INDEPENDENT_AMBULATORY_CARE_PROVIDER_SITE_OTHER): Payer: Medicare Other | Admitting: Cardiology

## 2017-03-11 ENCOUNTER — Encounter: Payer: Self-pay | Admitting: Cardiology

## 2017-03-11 VITALS — BP 136/70 | HR 80 | Ht 64.0 in | Wt 191.8 lb

## 2017-03-11 DIAGNOSIS — I10 Essential (primary) hypertension: Secondary | ICD-10-CM | POA: Diagnosis not present

## 2017-03-11 DIAGNOSIS — I4510 Unspecified right bundle-branch block: Secondary | ICD-10-CM

## 2017-03-11 DIAGNOSIS — E1159 Type 2 diabetes mellitus with other circulatory complications: Secondary | ICD-10-CM | POA: Diagnosis not present

## 2017-03-11 DIAGNOSIS — I495 Sick sinus syndrome: Secondary | ICD-10-CM | POA: Diagnosis not present

## 2017-03-11 DIAGNOSIS — I350 Nonrheumatic aortic (valve) stenosis: Secondary | ICD-10-CM | POA: Diagnosis not present
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
## 2017-03-11 NOTE — Patient Instructions (Signed)
Medication Instructions:  Your physician recommends that you continue on your current medications as directed. Please refer to the Current Medication list given to you today.   Labwork: None Ordered   Testing/Procedures: None Ordered   Follow-Up: Your physician recommends that you schedule a follow-up appointment in: with Dr. Tamala Julian first available.   Any Other Special Instructions Will Be Listed Below (If Applicable).     If you need a refill on your cardiac medications before your next appointment, please call your pharmacy.

## 2017-03-12 NOTE — Progress Notes (Signed)
I agree with this approach.

## 2017-03-25 DIAGNOSIS — E039 Hypothyroidism, unspecified: Secondary | ICD-10-CM | POA: Diagnosis not present

## 2017-03-25 DIAGNOSIS — K219 Gastro-esophageal reflux disease without esophagitis: Secondary | ICD-10-CM | POA: Diagnosis not present

## 2017-03-25 DIAGNOSIS — Z7984 Long term (current) use of oral hypoglycemic drugs: Secondary | ICD-10-CM | POA: Diagnosis not present

## 2017-03-25 DIAGNOSIS — G894 Chronic pain syndrome: Secondary | ICD-10-CM | POA: Diagnosis not present

## 2017-03-25 DIAGNOSIS — Z6833 Body mass index (BMI) 33.0-33.9, adult: Secondary | ICD-10-CM | POA: Diagnosis not present

## 2017-03-25 DIAGNOSIS — E669 Obesity, unspecified: Secondary | ICD-10-CM | POA: Diagnosis not present

## 2017-03-25 DIAGNOSIS — E11319 Type 2 diabetes mellitus with unspecified diabetic retinopathy without macular edema: Secondary | ICD-10-CM | POA: Diagnosis not present

## 2017-03-25 DIAGNOSIS — Z79899 Other long term (current) drug therapy: Secondary | ICD-10-CM | POA: Diagnosis not present

## 2017-03-25 DIAGNOSIS — E1165 Type 2 diabetes mellitus with hyperglycemia: Secondary | ICD-10-CM | POA: Diagnosis not present

## 2017-04-01 ENCOUNTER — Encounter: Payer: Self-pay | Admitting: *Deleted

## 2017-04-05 ENCOUNTER — Ambulatory Visit (INDEPENDENT_AMBULATORY_CARE_PROVIDER_SITE_OTHER): Payer: Medicare Other | Admitting: Diagnostic Neuroimaging

## 2017-04-05 ENCOUNTER — Encounter: Payer: Self-pay | Admitting: Diagnostic Neuroimaging

## 2017-04-05 VITALS — BP 125/70 | HR 81 | Ht 64.0 in | Wt 192.4 lb

## 2017-04-05 DIAGNOSIS — S065X9A Traumatic subdural hemorrhage with loss of consciousness of unspecified duration, initial encounter: Secondary | ICD-10-CM

## 2017-04-05 DIAGNOSIS — I6200 Nontraumatic subdural hemorrhage, unspecified: Secondary | ICD-10-CM

## 2017-04-05 DIAGNOSIS — G40109 Localization-related (focal) (partial) symptomatic epilepsy and epileptic syndromes with simple partial seizures, not intractable, without status epilepticus: Secondary | ICD-10-CM | POA: Diagnosis not present

## 2017-04-05 DIAGNOSIS — S065XAA Traumatic subdural hemorrhage with loss of consciousness status unknown, initial encounter: Secondary | ICD-10-CM

## 2017-04-05 MED ORDER — LEVETIRACETAM 500 MG PO TABS: 500.0000 mg | ORAL_TABLET | Freq: Two times a day (BID) | ORAL | 4 refills | Status: DC

## 2017-04-05 NOTE — Progress Notes (Signed)
[facility]  PATIENT: Hailey Cobb DOB: 1932/02/25  REFERRING CLINICIAN: K Little  HISTORY FROM: patient  REASON FOR VISIT: new consult    HISTORICAL  CHIEF COMPLAINT:  Chief Complaint  Patient presents with  . Seizures    rm 7, New Pt, "seizure following subdural hematoma 2 years ago, no seizures since"    HISTORY OF PRESENT ILLNESS:   83 year old female here for evaluation of seizure disorder.  Patient was admitted to the hospital for traumatic subdural hemorrhage in the right frontal region, from 07/07/14 until 07/08/14. A few weeks later patient had intermittent episodes of left leg numbness, left leg convulsions, left leg weakness. Patient followed up with neurosurgery who diagnosed possible partial seizures. Patient was treated with levetiracetam 500 mg twice a day. Since that time patient has had no further events.  Patient recently discussed with PCP to see if levetiracetam is still necessary. Patient was then referred to me for further evaluation to see if antiseizure medication can be discontinued or not.  Of note patient is primary caregiver for her husband who has dementia. She takes him to his appointments, and also takes care of all of their household daily needs.   REVIEW OF SYSTEMS: Full 14 system review of systems performed and negative with exception of: Fatigue murmur cough snoring joint pain difficult swallowing insomnia.  ALLERGIES: Allergies  Allergen Reactions  . Clonidine Derivatives Other (See Comments)    Dizzy  . Glimepiride Other (See Comments)    Hypoglycemia  . Invokana [Canagliflozin] Other (See Comments)    Weakness, perineal irritation    HOME MEDICATIONS: Outpatient Medications Prior to Visit  Medication Sig Dispense Refill  . ALPRAZolam (XANAX) 0.5 MG tablet Take 0.5 mg by mouth at bedtime.     Marland Kitchen amLODipine (NORVASC) 10 MG tablet Take 10 mg by mouth daily.    Marland Kitchen aspirin EC 81 MG tablet Take 81 mg by mouth daily.     . ciclopirox (LOPROX) 0.77 % cream Apply 1 application topically daily as needed (RASH).     Marland Kitchen doxazosin (CARDURA) 2 MG tablet Take 1 tablet (2 mg total) by mouth daily. 30 tablet 0  . esomeprazole (NEXIUM) 40 MG capsule Take 40 mg by mouth daily as needed (ACID REFLUX).     . furosemide (LASIX) 40 MG tablet Take 20 mg by mouth daily.    Marland Kitchen HYDROcodone-acetaminophen (NORCO) 5-325 MG tablet Take 1 tablet by mouth See admin instructions. Take 1 tablet every night at bedtime, can take one during the day as needed for pain    . hydrOXYzine (ATARAX/VISTARIL) 25 MG tablet Take 25 mg by mouth daily as needed for itching.     . irbesartan (AVAPRO) 150 MG tablet Take 150 mg by mouth 2 (two) times daily.    Marland Kitchen levETIRAcetam (KEPPRA) 500 MG tablet Take 500 mg by mouth every morning.     Marland Kitchen levothyroxine (SYNTHROID, LEVOTHROID) 100 MCG tablet Take 100 mcg by mouth every morning.  2  . metFORMIN (GLUCOPHAGE) 1000 MG tablet Take 1,000 mg by mouth 2 (two) times daily with a meal.    . Multiple Vitamins-Minerals (VISION VITAMINS PO) Take 1 tablet by mouth daily.    . rosuvastatin (CRESTOR) 20 MG tablet Take 20 mg by mouth daily.    . sitaGLIPtin (JANUVIA) 100 MG tablet Take 100 mg by mouth daily.    Marland Kitchen doxazosin (CARDURA) 8 MG tablet Take 1 tablet (8 mg total) by mouth at bedtime. 30 tablet 0  No facility-administered medications prior to visit.     PAST MEDICAL HISTORY: Past Medical History:  Diagnosis Date  . Anxiety   . Autoimmune hepatitis ([place])   . Diabetes mellitus without complication (HCC)    Type 2   . Environmental allergies   . GERD (gastroesophageal reflux disease)   . Heart murmur    mild AS by 01/2010 echo [facility]); moderate AS 07/24/14 echo [facility])  . Hepatitis    Autoimmune   . Hypercholesteremia   . Hypertension   . Hypothyroidism   . IBS (irritable bowel syndrome)   . Mucus pooling in larynx    Begin after having thyroidectomy; pt takes Mucus medication nightly  . Osteoarthritis     . S/P dilatation of esophageal stricture   . Seizures ([place])    subdural hematoma 2015  . Shortness of breath    with exertion  . Subdural hematoma ([place])   . Unilateral vocal cord paralysis   . Vitiligo     PAST SURGICAL HISTORY: Past Surgical History:  Procedure Laterality Date  . ABDOMINAL HYSTERECTOMY    . APPENDECTOMY    . APPLICATION OF A-CELL OF EXTREMITY Left 12/27/2014   Procedure: PLACEMENT OF A-CELL ;  Surgeon: Theodoro Kos, DO;  Location: [place];  Service: Plastics;  Laterality: Left;  . CATARACT EXTRACTION W/ INTRAOCULAR LENS  IMPLANT, BILATERAL Bilateral   . ELBOW BURSA SURGERY Left   . EYE SURGERY    . [name] 12/27/2014   Procedure: IRRIGATION AND DEBRIDEMENT OF LEFT FOOT AND ANKLE BURN WOUNDS WITH SURGICAL PREPS ;  Surgeon: Theodoro Kos, DO;  Location: [place];  Service: Plastics;  Laterality: Left;  . INCISION AND DRAINAGE OF WOUND Left 02/20/2015   Procedure: LEFT LEG WOUND IRRIGATION AND DEBRIDEMENT WITH ACELL/VAC PLACEMENT;  Surgeon: Theodoro Kos, DO;  Location: [place];  Service: Plastics;  Laterality: Left;  . JOINT REPLACEMENT Left    Knee  . JOINT REPLACEMENT Left    Shoulder  . LARYNGOPLASTY  2011   @ [facility]    . THYROIDECTOMY  11-2008  . TOTAL SHOULDER ARTHROPLASTY Right 05/16/2014   Procedure: TOTAL SHOULDER ARTHROPLASTY;  Surgeon: Ninetta Lights, MD;  Location: [place];  Service: Orthopedics;  Laterality: Right;  . vocal laryngoplasty     s/p left vocal fold medialization laryngoplasty with Goretex 02/07/10 (Dr. Wonda Amis)    FAMILY HISTORY: Family History  Problem Relation Age of Onset  . Stomach cancer Mother   . Stroke Brother   . Prostate cancer Brother   . Stroke Brother   . Multiple sclerosis Sister     SOCIAL HISTORY:  Social History   Social History  . Marital status: Married    Spouse name: N/A  . Number of children: 3  . Years of education: 12   Occupational History  .       retired   Social History Main Topics  . Smoking status: Never Smoker  . Smokeless tobacco: Never Used  . Alcohol use No  . Drug use: No  . Sexual activity: Not on file   Other Topics Concern  . Not on file   Social History Narrative   Lives with husband- his caregiver     PHYSICAL EXAM  GENERAL EXAM/CONSTITUTIONAL: Vitals:  Vitals:   04/05/17 1246  BP: 125/70  Pulse: 81  Weight: 192 lb 6.4 oz (87.3 kg)  Height: 5\' 4"  (1.626 m)     Body mass index is 33.03  kg/m.  Visual Acuity Screening   Right eye Left eye Both eyes  Without correction:     With correction: 20/40 20/50      Patient is in no distress; well developed, nourished and groomed; neck is supple  CARDIOVASCULAR:  Examination of carotid arteries is normal; no carotid bruits  Regular rate and rhythm; SYSTOLIC MURMUR  Examination of peripheral vascular system by observation and palpation is normal  EYES:  Ophthalmoscopic exam of optic discs and posterior segments is normal; no papilledema or hemorrhages  MUSCULOSKELETAL:  Gait, strength, tone, movements noted in Neurologic exam below  NEUROLOGIC: MENTAL STATUS:  No flowsheet data found.  awake, alert, oriented to person, place and time  recent and remote memory intact  normal attention and concentration  language fluent, comprehension intact, naming intact,   fund of knowledge appropriate  CRANIAL NERVE:   2nd - no papilledema on fundoscopic exam  2nd, 3rd, 4th, 6th - pupils equal and reactive to light, visual fields full to confrontation, extraocular muscles intact, no nystagmus  5th - facial sensation symmetric  7th - facial strength symmetric  8th - hearing intact  9th - palate elevates symmetrically, uvula midline  11th - shoulder shrug symmetric  12th - tongue protrusion midline  MOTOR:   normal bulk and tone, full strength in the BUE, BLE  SENSORY:   normal and symmetric to light touch, temperature,  vibration  COORDINATION:   finger-nose-finger, fine finger movements normal  REFLEXES:   deep tendon reflexes present and symmetric  GAIT/STATION:   narrow based gait    DIAGNOSTIC DATA (LABS, IMAGING, TESTING) - I reviewed patient records, labs, notes, testing and imaging myself where available.  Lab Results  Component Value Date   WBC 4.6 02/06/2017   HGB 12.2 02/06/2017   HCT 36.1 02/06/2017   MCV 90.3 02/06/2017   PLT 113 (L) 02/06/2017      Component Value Date/Time   NA 133 (L) 02/10/2017 0525   K 4.2 02/10/2017 0525   CL 98 (L) 02/10/2017 0525   CO2 29 02/10/2017 0525   GLUCOSE 145 (H) 02/10/2017 0525   BUN 13 02/10/2017 0525   CREATININE 0.66 02/10/2017 0525   CALCIUM 9.6 02/10/2017 0525   PROT 6.5 02/06/2017 0259   ALBUMIN 3.4 (L) 02/06/2017 0259   AST 23 02/06/2017 0259   ALT 17 02/06/2017 0259   ALKPHOS 58 02/06/2017 0259   BILITOT 0.6 02/06/2017 0259   GFRNONAA >60 02/10/2017 0525   GFRAA >60 02/10/2017 0525   Lab Results  Component Value Date   CHOL 132 07/24/2014   HDL 48 07/24/2014   LDLCALC 61 07/24/2014   TRIG 113 07/24/2014   CHOLHDL 2.8 07/24/2014   Lab Results  Component Value Date   HGBA1C 7.5 (H) 02/12/2015   No results found for: [id number] Lab Results  Component Value Date   TSH 6.286 (H) 02/05/2017     07/24/14 EEG  Impression: this is a normal awake and asleep EEG. Please, be aware that a normal EEG does not exclude the possibility of epilepsy. Clinical correlation is advised.   07/24/14 MRI BRAIN / MRA head [I reviewed images myself and agree with interpretation. -VRP]  1. Persistent medial right subdural hematoma, improved from 2 weeks ago. 2. Probable adjacent subarachnoid blood. 3. No acute hemorrhage. 4. Moderate atrophy and white matter disease bilaterally. 5. Bilateral mastoid effusions. No obstructing nasopharyngeal lesion is evident. 6. Atherosclerotic changes in the cavernous internal carotid arteries  bilaterally. The degree of  narrowing is likely exaggerated on the right due to adjacent susceptibility.  7. Otherwise normal MRA circle of Willis without evidence for significant proximal stenosis, aneurysm, or branch vessel occlusion.  07/24/14 MRI lumbar spine 1. Lumbar spondylosis and degenerative disc disease, causing prominent impingement at L4-5 and mild impingement at L2-3, L3-4, and L5-S1, as detailed above. 2. Multilevel degenerative subluxations in the lumbar spine.  07/07/14 CT head / cervical [I reviewed images myself and agree with interpretation. -VRP]  - 14 mm thick subdural hematoma along the anterior falx with mild mass effect on the adjacent right frontal lobe. No midline shift. No intra-axial hemorrhage. No hydrocephalus.  - No acute bony abnormality in the cervical spine.     ASSESSMENT AND PLAN  83 y.o. year old female here with history of left leg numbness, weakness, spasms, likely partial seizures related to right brain subdural hemorrhage from 2015. Symptoms improved after starting levetiracetam. Patient has been seizure-free since December 2015. We discussed options to consider tapering off of levetiracetam, given her current responsibilities and living situation, minimal side effects of levetiracetam, I recommend to continue for now.   Dx: partial seizures (right brain --> left leg) due to traumatic subdural hemorrhage (Nov 2015); last seizures in Dec 2015, before starting levetiracetam  1. Partial symptomatic epilepsy with simple partial seizures, not intractable, without status epilepticus (HCC)   2. Subdural hematoma (HCC)      PLAN:  - discussed option to taper off levetiracetam; given her other responsibilities (caregiver for her husband with dementia), I don't think it is worth the risk to taper off LEV at this time  - I recommend to continue levetiracetam 500mg  twice a day; will refill medication for now, but if PCP can refill then patient may follow  up with [place] as needed  Meds ordered this encounter  Medications  . DISCONTD: levETIRAcetam (KEPPRA) 500 MG tablet    Sig: Take 1 tablet (500 mg total) by mouth 2 (two) times daily.    Dispense:  180 tablet    Refill:  4  . levETIRAcetam (KEPPRA) 500 MG tablet    Sig: Take 1 tablet (500 mg total) by mouth 2 (two) times daily.    Dispense:  180 tablet    Refill:  4   Return if symptoms worsen or fail to improve, for return to PCP.    Penni Bombard, MD 5/72/6203, 5:59 PM Certified in Neurology, Neurophysiology and Neuroimaging  [facility] [address] [place], [place] [address] [phone]

## 2017-04-05 NOTE — Patient Instructions (Signed)
-   increase levetiracetam back up to 500mg  twice a day  - I refilled medication for now; please ask Dr. Rex Kras for future refills

## 2017-04-06 ENCOUNTER — Telehealth: Payer: Self-pay | Admitting: Diagnostic Neuroimaging

## 2017-04-06 MED ORDER — LEVETIRACETAM 500 MG PO TABS: 500.0000 mg | ORAL_TABLET | Freq: Two times a day (BID) | ORAL | 0 refills | Status: DC

## 2017-04-06 NOTE — Telephone Encounter (Addendum)
Keppra Rx sent electronically yesterday to [place], mail order. One month's supply of  Keppra sent electronically today to [facility], local pharmacy at patient's request.

## 2017-04-06 NOTE — Telephone Encounter (Signed)
Pt is asking for a 30 day supply of the levETIRAcetam (KEPPRA) 500 MG tablet to be sent to  [id number] - [place], [place]. [phone] (Phone) [phone] (Fax)   As well as the mail order, pt has not requested a call back

## 2017-04-06 NOTE — Addendum Note (Signed)
Addended by: Florian Buff C on: 04/06/2017 11:25 AM   Modules accepted: Orders

## 2017-04-27 DIAGNOSIS — H01005 Unspecified blepharitis left lower eyelid: Secondary | ICD-10-CM | POA: Diagnosis not present

## 2017-04-27 DIAGNOSIS — H524 Presbyopia: Secondary | ICD-10-CM | POA: Diagnosis not present

## 2017-04-27 DIAGNOSIS — H52223 Regular astigmatism, bilateral: Secondary | ICD-10-CM | POA: Diagnosis not present

## 2017-04-27 DIAGNOSIS — H01001 Unspecified blepharitis right upper eyelid: Secondary | ICD-10-CM | POA: Diagnosis not present

## 2017-04-27 DIAGNOSIS — H5213 Myopia, bilateral: Secondary | ICD-10-CM | POA: Diagnosis not present

## 2017-04-27 DIAGNOSIS — H01002 Unspecified blepharitis right lower eyelid: Secondary | ICD-10-CM | POA: Diagnosis not present

## 2017-04-27 DIAGNOSIS — H01004 Unspecified blepharitis left upper eyelid: Secondary | ICD-10-CM | POA: Diagnosis not present

## 2017-05-01 DIAGNOSIS — I495 Sick sinus syndrome: Secondary | ICD-10-CM | POA: Insufficient documentation

## 2017-05-03 ENCOUNTER — Ambulatory Visit (INDEPENDENT_AMBULATORY_CARE_PROVIDER_SITE_OTHER): Payer: Medicare Other | Admitting: Interventional Cardiology

## 2017-05-03 ENCOUNTER — Encounter: Payer: Self-pay | Admitting: Interventional Cardiology

## 2017-05-03 VITALS — BP 138/66 | HR 88 | Ht 64.0 in | Wt 191.0 lb

## 2017-05-03 DIAGNOSIS — S065X9A Traumatic subdural hemorrhage with loss of consciousness of unspecified duration, initial encounter: Secondary | ICD-10-CM

## 2017-05-03 DIAGNOSIS — I6320 Cerebral infarction due to unspecified occlusion or stenosis of unspecified precerebral arteries: Secondary | ICD-10-CM

## 2017-05-03 DIAGNOSIS — I4510 Unspecified right bundle-branch block: Secondary | ICD-10-CM

## 2017-05-03 DIAGNOSIS — I6200 Nontraumatic subdural hemorrhage, unspecified: Secondary | ICD-10-CM

## 2017-05-03 DIAGNOSIS — I10 Essential (primary) hypertension: Secondary | ICD-10-CM

## 2017-05-03 DIAGNOSIS — I495 Sick sinus syndrome: Secondary | ICD-10-CM

## 2017-05-03 DIAGNOSIS — S065XAA Traumatic subdural hemorrhage with loss of consciousness status unknown, initial encounter: Secondary | ICD-10-CM

## 2017-05-03 DIAGNOSIS — I350 Nonrheumatic aortic (valve) stenosis: Secondary | ICD-10-CM

## 2017-05-03 NOTE — Patient Instructions (Signed)
Medication Instructions:  None  Labwork: None  Testing/Procedures: Your physician has requested that you have an echocardiogram prior to one year follow up with Dr. Tamala Julian in 04/2018. Echocardiography is a painless test that uses sound waves to create images of your heart. It provides your doctor with information about the size and shape of your heart and how well your heart's chambers and valves are working. This procedure takes approximately one hour. There are no restrictions for this procedure.    Follow-Up: Your physician wants you to follow-up in: 1 year with Dr. Tamala Julian.  You will receive a reminder letter in the mail two months in advance. If you don't receive a letter, please call our office to schedule the follow-up appointment.   Any Other Special Instructions Will Be Listed Below (If Applicable).     If you need a refill on your cardiac medications before your next appointment, please call your pharmacy.

## 2017-05-03 NOTE — Progress Notes (Signed)
Cardiology Office Note    Date:  05/03/2017   ID:  Hailey Cobb, DOB 1931/12/22, MRN [id number]  PCP:  Hulan Fess, MD  Cardiologist: Sinclair Grooms, MD   Chief Complaint  Patient presents with  . Cardiac Valve Problem    Aortic valve disease  . Follow-up    Tachycardia-bradycardia    History of Present Illness:  Hailey Cobb is a 83 y.o. female a hx of moderate aortic stenosis and recent hospitalization for syncope/near-syncope due to profound bradycardia with heart rates of 36 bpm. Bradycardia resolved off metoprolol 50 mg twice a day. Beta blocker therapy is subsequently been withheld  She is doing well. She was discharged from the hospital 02/10/2017 after presenting with near syncope and bradycardia of 36 bpm in sinus rhythm. Beta blocker therapy had been used to control essential hypertension. Other medication adjustments were made. She tolerated them well. She has not had recurrent severe dizziness or syncope. She denies orthopnea. Overall she feels improved. She is accompanied by her daughter. She cares for her husband who has dementia.   Past Medical History:  Diagnosis Date  . Anxiety   . Autoimmune hepatitis ([place])   . Diabetes mellitus without complication (HCC)    Type 2   . Environmental allergies   . GERD (gastroesophageal reflux disease)   . Heart murmur    mild AS by 01/2010 echo [facility]); moderate AS 07/24/14 echo [facility])  . Hepatitis    Autoimmune   . Hypercholesteremia   . Hypertension   . Hypothyroidism   . IBS (irritable bowel syndrome)   . Mucus pooling in larynx    Begin after having thyroidectomy; pt takes Mucus medication nightly  . Osteoarthritis   . S/P dilatation of esophageal stricture   . Seizures ([place])    subdural hematoma 2015  . Shortness of breath    with exertion  . Subdural hematoma ([place])   . Unilateral vocal cord paralysis   . Vitiligo     Past Surgical History:  Procedure Laterality Date  . ABDOMINAL HYSTERECTOMY    .  APPENDECTOMY    . APPLICATION OF A-CELL OF EXTREMITY Left 12/27/2014   Procedure: PLACEMENT OF A-CELL ;  Surgeon: Theodoro Kos, DO;  Location: [place];  Service: Plastics;  Laterality: Left;  . CATARACT EXTRACTION W/ INTRAOCULAR LENS  IMPLANT, BILATERAL Bilateral   . ELBOW BURSA SURGERY Left   . EYE SURGERY    . [name] 12/27/2014   Procedure: IRRIGATION AND DEBRIDEMENT OF LEFT FOOT AND ANKLE BURN WOUNDS WITH SURGICAL PREPS ;  Surgeon: Theodoro Kos, DO;  Location: [place];  Service: Plastics;  Laterality: Left;  . INCISION AND DRAINAGE OF WOUND Left 02/20/2015   Procedure: LEFT LEG WOUND IRRIGATION AND DEBRIDEMENT WITH ACELL/VAC PLACEMENT;  Surgeon: Theodoro Kos, DO;  Location: [place];  Service: Plastics;  Laterality: Left;  . JOINT REPLACEMENT Left    Knee  . JOINT REPLACEMENT Left    Shoulder  . LARYNGOPLASTY  2011   @ [facility]    . THYROIDECTOMY  11-2008  . TOTAL SHOULDER ARTHROPLASTY Right 05/16/2014   Procedure: TOTAL SHOULDER ARTHROPLASTY;  Surgeon: Ninetta Lights, MD;  Location: [place];  Service: Orthopedics;  Laterality: Right;  . vocal laryngoplasty     s/p left vocal fold medialization laryngoplasty with Goretex 02/07/10 (Dr. Wonda Amis)    Current Medications: Outpatient Medications Prior to Visit  Medication Sig Dispense Refill  . ALPRAZolam Duanne Moron)  0.5 MG tablet Take 0.5 mg by mouth at bedtime.     Marland Kitchen amLODipine (NORVASC) 10 MG tablet Take 10 mg by mouth daily.    Marland Kitchen aspirin EC 81 MG tablet Take 81 mg by mouth daily.    . ciclopirox (LOPROX) 0.77 % cream Apply 1 application topically daily as needed (RASH).     Marland Kitchen doxazosin (CARDURA) 2 MG tablet Take 1 tablet (2 mg total) by mouth daily. 30 tablet 0  . esomeprazole (NEXIUM) 40 MG capsule Take 40 mg by mouth daily as needed (ACID REFLUX).     . furosemide (LASIX) 40 MG tablet Take 20 mg by mouth daily.    Marland Kitchen HYDROcodone-acetaminophen (NORCO) 5-325 MG tablet Take 1 tablet by mouth See  admin instructions. Take 1 tablet every night at bedtime, can take one during the day as needed for pain    . hydrOXYzine (ATARAX/VISTARIL) 25 MG tablet Take 25 mg by mouth daily as needed for itching.     . irbesartan (AVAPRO) 150 MG tablet Take 150 mg by mouth 2 (two) times daily.    Marland Kitchen levETIRAcetam (KEPPRA) 500 MG tablet Take 1 tablet (500 mg total) by mouth 2 (two) times daily. 60 tablet 0  . levothyroxine (SYNTHROID, LEVOTHROID) 100 MCG tablet Take 100 mcg by mouth every morning.  2  . metFORMIN (GLUCOPHAGE) 1000 MG tablet Take 1,000 mg by mouth 2 (two) times daily with a meal.    . Multiple Vitamins-Minerals (VISION VITAMINS PO) Take 1 tablet by mouth daily.    . rosuvastatin (CRESTOR) 20 MG tablet Take 20 mg by mouth daily.    . sitaGLIPtin (JANUVIA) 100 MG tablet Take 100 mg by mouth daily.     No facility-administered medications prior to visit.      Allergies:   Clonidine derivatives; Glimepiride; and Invokana [canagliflozin]   Social History   Social History  . Marital status: Married    Spouse name: N/A  . Number of children: 3  . Years of education: 12   Occupational History  .      retired   Social History Main Topics  . Smoking status: Never Smoker  . Smokeless tobacco: Never Used  . Alcohol use No  . Drug use: No  . Sexual activity: Not Asked   Other Topics Concern  . None   Social History Narrative   Lives with husband- his caregiver     Family History:  The patient's family history includes Multiple sclerosis in her sister; Prostate cancer in her brother; Stomach cancer in her mother; Stroke in her brother and brother.   ROS:   Please see the history of present illness.    Leg pain, excessive sweating, anxiety, joint swelling, color with balance, snoring, cough, and back pain.  All other systems reviewed and are negative.   PHYSICAL EXAM:   VS:  BP 138/66 (BP Location: Right Arm)   Pulse 88   Ht 5\' 4"  (1.626 m)   Wt 191 lb (86.6 kg)   LMP  (LMP  Unknown)   BMI 32.79 kg/m    GEN: Well nourished, well developed, in no acute distress  HEENT: normal  Neck: no JVD, carotid bruits, or masses Cardiac: RRR; no murmurs, rubs, or gallops,no edema  Respiratory:  clear to auscultation bilaterally, normal work of breathing GI: soft, nontender, nondistended, + BS MS: no deformity or atrophy  Skin: warm and dry, no rash Neuro:  Alert and Oriented x 3, Strength and sensation are intact Psych:  euthymic mood, full affect  Wt Readings from Last 3 Encounters:  05/03/17 191 lb (86.6 kg)  04/05/17 192 lb 6.4 oz (87.3 kg)  03/11/17 191 lb 12.8 oz (87 kg)      Studies/Labs Reviewed:   EKG:  EKG  Not repeated.  Recent Labs: 02/05/2017: TSH 6.286 02/06/2017: ALT 17; Hemoglobin 12.2; Magnesium 1.6; Platelets 113 02/10/2017: BUN 13; Creatinine, Ser 0.66; Potassium 4.2; Sodium 133   Lipid Panel    Component Value Date/Time   CHOL 132 07/24/2014 0450   TRIG 113 07/24/2014 0450   HDL 48 07/24/2014 0450   CHOLHDL 2.8 07/24/2014 0450   VLDL 23 07/24/2014 0450   LDLCALC 61 07/24/2014 0450    Additional studies/ records that were reviewed today include:   Echocardiogram 01/2017:Study  Conclusions   - Left ventricle: The cavity size was normal. There was severe   focal basal hypertrophy of the septum. Systolic function was   vigorous. The estimated ejection fraction was in the range of 65%   to 70%. Wall motion was normal; there were no regional wall   motion abnormalities. Doppler parameters are consistent with   abnormal left ventricular relaxation (grade 1 diastolic   dysfunction). - Aortic valve: Trileaflet; moderately thickened, severely   calcified leaflets. Valve mobility was restricted. There was   moderate stenosis. There was mild regurgitation. Peak velocity   (S): 354 cm/s. Mean gradient (S): 27 mm Hg. - Mitral valve: There was mild regurgitation. - Left atrium: The atrium was mildly dilated. Volume/bsa, ES   (1-plane  Simpson&'s, A4C): 35.2 ml/m^2. - Tricuspid valve: There was mild regurgitation. - Pulmonary arteries: Systolic pressure was mildly increased. PA   peak pressure: 42 mm Hg (S).   Impressions:   - When compared to prior, aortic stenosis remains moderate  48-hour Holter monitor July 2018 Study Highlights     NSR with PVC's and PAC's  No pauses or excessive badycardia  Brief SVT and or Atrial flutter at 150 bpm.  No atrial fibrillation  Ave HR 80 bpm with range 53 to 153 bpm.   NSR with PVC's and rare PAC's Brief SVT / A. Flutter. Ave HR 80 bpm.      ASSESSMENT:    1. Nonrheumatic aortic valve stenosis   2. Tachycardia-bradycardia syndrome ([place])   3. Essential hypertension   4. Right bundle branch block   5. Cerebral infarction due to occlusion of precerebral artery (HCC)   6. Subdural hematoma (HCC)      PLAN:  In order of problems listed above:  1. Recent echo demonstrated moderate aortic stenosis not significantly changed when compared to 2 years ago. Echocardiogram will be repeated in one year prior to follow-up. 2. Recent profound bradycardia on 100 mg of Toprol-XL. Beta blocker therapy has been completely discontinued and a 48 hour Holter monitor performed demonstrating average heart rate of 80 bpm. She had brief SVT or atrial flutter at 150 bpm. No atrial fibrillation was noted on the monitor. For the time being and we will continue off medications a slow heart rate. 3. Blood pressure is adequately controlled on the current regimen. No changes are recommended at this time. 4. Not addressed 5. Not addressed  Clinical follow-up in one year for aortic stenosis. Continue off beta blocker therapy and other agents that block AV conduction or slow sinus automaticity. Monitor for recurrent syncope, dyspnea, or chest pain.    Medication Adjustments/Labs and Tests Ordered: Current medicines are reviewed at length with the patient today.  Concerns regarding medicines  are outlined above.  Medication changes, Labs and Tests ordered today are listed in the Patient Instructions below. Patient Instructions  Medication Instructions:  None  Labwork: None  Testing/Procedures: Your physician has requested that you have an echocardiogram prior to one year follow up with Dr. Tamala Julian in 04/2018. Echocardiography is a painless test that uses sound waves to create images of your heart. It provides your doctor with information about the size and shape of your heart and how well your heart's chambers and valves are working. This procedure takes approximately one hour. There are no restrictions for this procedure.    Follow-Up: Your physician wants you to follow-up in: 1 year with Dr. Tamala Julian.  You will receive a reminder letter in the mail two months in advance. If you don't receive a letter, please call our office to schedule the follow-up appointment.   Any Other Special Instructions Will Be Listed Below (If Applicable).     If you need a refill on your cardiac medications before your next appointment, please call your pharmacy.      Signed, Sinclair Grooms, MD  05/03/2017 5:15 PM    [facility] [place], [place],   [address] Phone: [phone]; Fax: [phone]

## 2017-05-17 DIAGNOSIS — M50322 Other cervical disc degeneration at C5-C6 level: Secondary | ICD-10-CM | POA: Diagnosis not present

## 2017-05-17 DIAGNOSIS — M9903 Segmental and somatic dysfunction of lumbar region: Secondary | ICD-10-CM | POA: Diagnosis not present

## 2017-05-17 DIAGNOSIS — M5136 Other intervertebral disc degeneration, lumbar region: Secondary | ICD-10-CM | POA: Diagnosis not present

## 2017-05-17 DIAGNOSIS — M9901 Segmental and somatic dysfunction of cervical region: Secondary | ICD-10-CM | POA: Diagnosis not present

## 2017-05-25 DIAGNOSIS — M9901 Segmental and somatic dysfunction of cervical region: Secondary | ICD-10-CM | POA: Diagnosis not present

## 2017-05-25 DIAGNOSIS — M5136 Other intervertebral disc degeneration, lumbar region: Secondary | ICD-10-CM | POA: Diagnosis not present

## 2017-05-25 DIAGNOSIS — M50322 Other cervical disc degeneration at C5-C6 level: Secondary | ICD-10-CM | POA: Diagnosis not present

## 2017-05-25 DIAGNOSIS — M9903 Segmental and somatic dysfunction of lumbar region: Secondary | ICD-10-CM | POA: Diagnosis not present

## 2017-06-02 DIAGNOSIS — Z23 Encounter for immunization: Secondary | ICD-10-CM | POA: Diagnosis not present

## 2017-06-15 DIAGNOSIS — M545 Low back pain: Secondary | ICD-10-CM | POA: Diagnosis not present

## 2017-06-15 DIAGNOSIS — M25551 Pain in right hip: Secondary | ICD-10-CM | POA: Diagnosis not present

## 2017-06-15 DIAGNOSIS — M25562 Pain in left knee: Secondary | ICD-10-CM | POA: Diagnosis not present

## 2017-06-15 DIAGNOSIS — M25561 Pain in right knee: Secondary | ICD-10-CM | POA: Diagnosis not present

## 2017-06-28 DIAGNOSIS — E871 Hypo-osmolality and hyponatremia: Secondary | ICD-10-CM | POA: Diagnosis not present

## 2017-06-28 DIAGNOSIS — E11319 Type 2 diabetes mellitus with unspecified diabetic retinopathy without macular edema: Secondary | ICD-10-CM | POA: Diagnosis not present

## 2017-06-28 DIAGNOSIS — E039 Hypothyroidism, unspecified: Secondary | ICD-10-CM | POA: Diagnosis not present

## 2017-06-28 DIAGNOSIS — E669 Obesity, unspecified: Secondary | ICD-10-CM | POA: Diagnosis not present

## 2017-06-28 DIAGNOSIS — I10 Essential (primary) hypertension: Secondary | ICD-10-CM | POA: Diagnosis not present

## 2017-06-28 DIAGNOSIS — Z87898 Personal history of other specified conditions: Secondary | ICD-10-CM | POA: Diagnosis not present

## 2017-06-28 DIAGNOSIS — Z7984 Long term (current) use of oral hypoglycemic drugs: Secondary | ICD-10-CM | POA: Diagnosis not present

## 2017-06-28 DIAGNOSIS — Z79899 Other long term (current) drug therapy: Secondary | ICD-10-CM | POA: Diagnosis not present

## 2017-06-28 DIAGNOSIS — Z6833 Body mass index (BMI) 33.0-33.9, adult: Secondary | ICD-10-CM | POA: Diagnosis not present

## 2017-06-28 DIAGNOSIS — R001 Bradycardia, unspecified: Secondary | ICD-10-CM | POA: Diagnosis not present

## 2017-06-28 DIAGNOSIS — G894 Chronic pain syndrome: Secondary | ICD-10-CM | POA: Diagnosis not present

## 2017-06-29 DIAGNOSIS — M25561 Pain in right knee: Secondary | ICD-10-CM | POA: Diagnosis not present

## 2017-07-01 ENCOUNTER — Telehealth: Payer: Self-pay

## 2017-07-01 NOTE — Telephone Encounter (Signed)
No anticoagulation in med list

## 2017-07-01 NOTE — Telephone Encounter (Signed)
    Chart reviewed as part of pre-operative protocol coverage. Patient was contacted 07/01/2017 in reference to pre-operative risk assessment for pending surgery as outlined below.  Hailey Cobb was last seen on 05/03/2017 by Dr. Tamala Julian.  Since that day, Hailey Cobb has done well. She has not had any chest pain, SOB, dizziness, presyncope, or syncope. She is able to achieve at least 4 METs. We recommend she continue aspirin.   Therefore, based on ACC/AHA guidelines, the patient would be at acceptable risk for the planned procedure without further cardiovascular testing.   I will route this recommendation to the requesting party via Epic fax function and remove from pre-op pool.  Pre-op staff, please send most recent labs, ekg, and echo to the surgeon's office.   Please call with questions.  Christell Faith, PA-C 07/01/2017, 2:30 PM

## 2017-07-01 NOTE — Telephone Encounter (Signed)
   [facility] Pre-operative Risk Assessment    Request for surgical clearance:  1. What type of surgery is being performed? Right total knee replacement.  2. When is this surgery scheduled? pending   3. Are there any medications that need to be held prior to surgery and how long? Please advise as to any special instructions or recommendations according to patient medical history and current medications. Please send most recent labs, ekg, or special studies back with clearance.  4. Practice name and name of physician performing surgery? [facility]. Dr. Fredonia Highland.  5. What is your office phone and fax number? Phone: [phone]. Fax: [phone]. ATTN: Sherri  6. Anesthesia type (None, local, MAC, general) ? Not specified.   Stephannie Peters 07/01/2017, 10:35 AM  _________________________________________________________________   (provider comments below)

## 2017-07-02 NOTE — Telephone Encounter (Signed)
Clearance note, last office note, last echo routed via Epic fax to Dr. Fredonia Highland. No recent labs ordered by Dr. Tamala Julian or EKG to fax. Last EKG ordered by Dr. Tamala Julian 07/2016.

## 2017-08-03 ENCOUNTER — Encounter (HOSPITAL_BASED_OUTPATIENT_CLINIC_OR_DEPARTMENT_OTHER): Payer: Self-pay | Admitting: *Deleted

## 2017-08-03 ENCOUNTER — Emergency Department (HOSPITAL_BASED_OUTPATIENT_CLINIC_OR_DEPARTMENT_OTHER)
Admission: EM | Admit: 2017-08-03 | Discharge: 2017-08-03 | Disposition: A | Discharge status: Home / Self Care | Payer: Medicare Other | Attending: Emergency Medicine | Admitting: Emergency Medicine

## 2017-08-03 ENCOUNTER — Other Ambulatory Visit: Payer: Self-pay

## 2017-08-03 ENCOUNTER — Emergency Department (HOSPITAL_BASED_OUTPATIENT_CLINIC_OR_DEPARTMENT_OTHER): Payer: Medicare Other

## 2017-08-03 DIAGNOSIS — E039 Hypothyroidism, unspecified: Secondary | ICD-10-CM | POA: Diagnosis not present

## 2017-08-03 DIAGNOSIS — Z7982 Long term (current) use of aspirin: Secondary | ICD-10-CM | POA: Diagnosis not present

## 2017-08-03 DIAGNOSIS — E119 Type 2 diabetes mellitus without complications: Secondary | ICD-10-CM | POA: Diagnosis not present

## 2017-08-03 DIAGNOSIS — I10 Essential (primary) hypertension: Secondary | ICD-10-CM | POA: Insufficient documentation

## 2017-08-03 DIAGNOSIS — N132 Hydronephrosis with renal and ureteral calculous obstruction: Secondary | ICD-10-CM | POA: Diagnosis not present

## 2017-08-03 DIAGNOSIS — N200 Calculus of kidney: Secondary | ICD-10-CM

## 2017-08-03 DIAGNOSIS — N201 Calculus of ureter: Secondary | ICD-10-CM | POA: Diagnosis not present

## 2017-08-03 DIAGNOSIS — Z79899 Other long term (current) drug therapy: Secondary | ICD-10-CM | POA: Insufficient documentation

## 2017-08-03 DIAGNOSIS — M549 Dorsalgia, unspecified: Secondary | ICD-10-CM | POA: Insufficient documentation

## 2017-08-03 DIAGNOSIS — Z7984 Long term (current) use of oral hypoglycemic drugs: Secondary | ICD-10-CM | POA: Insufficient documentation

## 2017-08-03 DIAGNOSIS — R109 Unspecified abdominal pain: Secondary | ICD-10-CM | POA: Diagnosis present

## 2017-08-03 LAB — CBC WITH DIFFERENTIAL/PLATELET
Basophils Absolute: 0 10*3/uL (ref 0.0–0.1)
Basophils Relative: 0 %
Eosinophils Absolute: 0 10*3/uL (ref 0.0–0.7)
Eosinophils Relative: 0 %
HCT: 37.8 % (ref 36.0–46.0)
Hemoglobin: 12.7 g/dL (ref 12.0–15.0)
Lymphocytes Relative: 7 %
Lymphs Abs: 0.6 10*3/uL — ABNORMAL LOW (ref 0.7–4.0)
MCH: 29.7 pg (ref 26.0–34.0)
MCHC: 33.6 g/dL (ref 30.0–36.0)
MCV: 88.3 fL (ref 78.0–100.0)
Monocytes Absolute: 0.5 10*3/uL (ref 0.1–1.0)
Monocytes Relative: 6 %
Neutro Abs: 7.4 10*3/uL (ref 1.7–7.7)
Neutrophils Relative %: 87 %
Platelets: 134 10*3/uL — ABNORMAL LOW (ref 150–400)
RBC: 4.28 MIL/uL (ref 3.87–5.11)
RDW: 14.2 % (ref 11.5–15.5)
WBC: 8.6 10*3/uL (ref 4.0–10.5)

## 2017-08-03 LAB — COMPREHENSIVE METABOLIC PANEL
ALT: 20 U/L (ref 14–54)
AST: 27 U/L (ref 15–41)
Albumin: 4.3 g/dL (ref 3.5–5.0)
Alkaline Phosphatase: 80 U/L (ref 38–126)
Anion gap: 11 (ref 5–15)
BUN: 14 mg/dL (ref 6–20)
CO2: 23 mmol/L (ref 22–32)
Calcium: 10.1 mg/dL (ref 8.9–10.3)
Chloride: 99 mmol/L — ABNORMAL LOW (ref 101–111)
Creatinine, Ser: 0.69 mg/dL (ref 0.44–1.00)
GFR calc Af Amer: >60 mL/min (ref >60)
GFR calc non Af Amer: >60 mL/min (ref >60)
Glucose, Bld: 173 mg/dL — ABNORMAL HIGH (ref 65–99)
Potassium: 4.1 mmol/L (ref 3.5–5.1)
Sodium: 133 mmol/L — ABNORMAL LOW (ref 135–145)
Total Bilirubin: 0.4 mg/dL (ref 0.3–1.2)
Total Protein: 8 g/dL (ref 6.5–8.1)

## 2017-08-03 LAB — URINALYSIS, ROUTINE W REFLEX MICROSCOPIC
Bilirubin Urine: NEGATIVE
Glucose, UA: NEGATIVE mg/dL
Ketones, ur: NEGATIVE mg/dL
Nitrite: NEGATIVE
Protein, ur: NEGATIVE mg/dL
Specific Gravity, Urine: 1.025 (ref 1.005–1.030)
pH: 5.5 (ref 5.0–8.0)

## 2017-08-03 LAB — URINALYSIS, MICROSCOPIC (REFLEX)

## 2017-08-03 LAB — LIPASE, BLOOD: Lipase: 48 U/L (ref 11–51)

## 2017-08-03 MED ORDER — SODIUM CHLORIDE 0.9 % IV BOLUS (SEPSIS)
1000.0000 mL | Freq: Once | INTRAVENOUS | Status: AC
  Administered 2017-08-03: 1000 mL via INTRAVENOUS

## 2017-08-03 MED ORDER — ONDANSETRON HCL 4 MG/2ML IJ SOLN
4.0000 mg | Freq: Once | INTRAMUSCULAR | Status: AC
  Administered 2017-08-03: 4 mg via INTRAVENOUS
  Filled 2017-08-03: qty 2

## 2017-08-03 MED ORDER — KETOROLAC TROMETHAMINE 15 MG/ML IJ SOLN
15.0000 mg | Freq: Once | INTRAMUSCULAR | Status: AC
  Administered 2017-08-03: 15 mg via INTRAVENOUS
  Filled 2017-08-03: qty 1

## 2017-08-03 MED ORDER — MORPHINE SULFATE (PF) 4 MG/ML IV SOLN
4.0000 mg | Freq: Once | INTRAVENOUS | Status: AC
  Administered 2017-08-03: 4 mg via INTRAVENOUS
  Filled 2017-08-03: qty 1

## 2017-08-03 MED ORDER — TAMSULOSIN HCL 0.4 MG PO CAPS: 0.4000 mg | ORAL_CAPSULE | Freq: Every day | ORAL | 0 refills | Status: DC

## 2017-08-03 MED ORDER — MORPHINE SULFATE 15 MG PO TABS: 15.0000 mg | ORAL_TABLET | ORAL | 0 refills | Status: DC | PRN

## 2017-08-03 MED ORDER — ONDANSETRON 4 MG PO TBDP: ORAL_TABLET | ORAL | 0 refills | Status: DC

## 2017-08-03 NOTE — ED Notes (Signed)
Pt urinated but did not use the urine cup.

## 2017-08-03 NOTE — ED Provider Notes (Signed)
[facility] Provider Note   CSN: [id number] Arrival date & time: 08/03/17  1453     History   Chief Complaint Chief Complaint  Patient presents with  . Abdominal Pain  . Back Pain    HPI Hailey Cobb is a 83 y.o. female.  83 yo F with a chief complaint of dysuria increased frequency and hesitancy.  Patient is also having some severe lower abdominal pain.  She is very uncomfortable on exam.  States she has spent all day trying to urinate and having issues with it.  Denies fevers but has had some chills.  No nausea or vomiting.  No back pain.  History of urinary tract infections in the past but this feels different.  No known history of kidney stones.   The history is provided by the patient.  Abdominal Pain   This is a new problem. The current episode started 6 to 12 hours ago. The problem occurs constantly. The problem has been gradually worsening. The pain is located in the suprapubic region. The quality of the pain is sharp and shooting. The pain is at a severity of 10/10. The pain is severe. Associated symptoms include dysuria and frequency. Pertinent negatives include fever, nausea, vomiting, headaches, arthralgias and myalgias. Nothing aggravates the symptoms. Nothing relieves the symptoms.  Back Pain   Associated symptoms include abdominal pain and dysuria. Pertinent negatives include no chest pain, no fever and no headaches.    Past Medical History:  Diagnosis Date  . Anxiety   . Autoimmune hepatitis ([place])   . Diabetes mellitus without complication (HCC)    Type 2   . Environmental allergies   . GERD (gastroesophageal reflux disease)   . Heart murmur    mild AS by 01/2010 echo [facility]); moderate AS 07/24/14 echo [facility])  . Hepatitis    Autoimmune   . Hypercholesteremia   . Hypertension   . Hypothyroidism   . IBS (irritable bowel syndrome)   . Mucus pooling in larynx    Begin after having thyroidectomy; pt takes Mucus medication  nightly  . Osteoarthritis   . S/P dilatation of esophageal stricture   . Seizures ([place])    subdural hematoma 2015  . Shortness of breath    with exertion  . Subdural hematoma ([place])   . Unilateral vocal cord paralysis   . Vitiligo     Patient Active Problem List   Diagnosis Date Noted  . Tachycardia-bradycardia syndrome ([place]) 05/01/2017  . Symptomatic bradycardia 02/06/2017  . Dehydration 02/06/2017  . Hyperkalemia 02/06/2017  . Hypercalcemia 02/06/2017  . Right bundle branch block 07/30/2015  . Aortic stenosis 07/29/2015  . Cellulitis of left leg 12/20/2014  . Diabetes mellitus type 2, controlled ([place]) 12/20/2014  . Cellulitis 12/20/2014  . Acute left-sided weakness   . Cerebral infarction ([place]) 07/23/2014  . Diabetes mellitus ([place]) 07/23/2014  . Anxiety 07/23/2014  . Hypothyroidism 07/23/2014  . Essential hypertension 07/23/2014  . Subdural hematoma ([place]) 07/07/2014  . Arthritis of shoulder region, right 05/16/2014    Past Surgical History:  Procedure Laterality Date  . ABDOMINAL HYSTERECTOMY    . APPENDECTOMY    . APPLICATION OF A-CELL OF EXTREMITY Left 12/27/2014   Procedure: PLACEMENT OF A-CELL ;  Surgeon: Theodoro Kos, DO;  Location: [facility];  Service: Plastics;  Laterality: Left;  . CATARACT EXTRACTION W/ INTRAOCULAR LENS  IMPLANT, BILATERAL Bilateral   . ELBOW BURSA SURGERY Left   . EYE SURGERY    .  [name] 12/27/2014   Procedure: IRRIGATION AND DEBRIDEMENT OF LEFT FOOT AND ANKLE BURN WOUNDS WITH SURGICAL PREPS ;  Surgeon: Theodoro Kos, DO;  Location: [facility];  Service: Plastics;  Laterality: Left;  . INCISION AND DRAINAGE OF WOUND Left 02/20/2015   Procedure: LEFT LEG WOUND IRRIGATION AND DEBRIDEMENT WITH ACELL/VAC PLACEMENT;  Surgeon: Theodoro Kos, DO;  Location: [facility];  Service: Plastics;  Laterality: Left;  . JOINT REPLACEMENT Left    Knee  . JOINT REPLACEMENT Left    Shoulder  . LARYNGOPLASTY  2011   @ [facility]     . THYROIDECTOMY  11-2008  . TOTAL SHOULDER ARTHROPLASTY Right 05/16/2014   Procedure: TOTAL SHOULDER ARTHROPLASTY;  Surgeon: Ninetta Lights, MD;  Location: [place];  Service: Orthopedics;  Laterality: Right;  . vocal laryngoplasty     s/p left vocal fold medialization laryngoplasty with Goretex 02/07/10 (Dr. Wonda Amis)    OB History    No data available       Home Medications    Prior to Admission medications   Medication Sig Start Date End Date Taking? Authorizing Provider  ALPRAZolam Duanne Moron) 0.5 MG tablet Take 0.5 mg by mouth at bedtime.     [provider]  amLODipine (NORVASC) 10 MG tablet Take 10 mg by mouth daily.    [provider]  aspirin EC 81 MG tablet Take 81 mg by mouth daily.    [provider]  ciclopirox (LOPROX) 0.77 % cream Apply 1 application topically daily as needed (RASH).     [provider]  doxazosin (CARDURA) 2 MG tablet Take 1 tablet (2 mg total) by mouth daily. 02/11/17   Velvet Bathe, MD  esomeprazole (NEXIUM) 40 MG capsule Take 40 mg by mouth daily as needed (ACID REFLUX).     [provider]  furosemide (LASIX) 40 MG tablet Take 20 mg by mouth daily.    [provider]  HYDROcodone-acetaminophen (NORCO) 5-325 MG tablet Take 1 tablet by mouth See admin instructions. Take 1 tablet every night at bedtime, can take one during the day as needed for pain 01/01/15   [provider]  hydrOXYzine (ATARAX/VISTARIL) 25 MG tablet Take 25 mg by mouth daily as needed for itching.     [provider]  irbesartan (AVAPRO) 150 MG tablet Take 150 mg by mouth 2 (two) times daily.    [provider]  levETIRAcetam (KEPPRA) 500 MG tablet Take 1 tablet (500 mg total) by mouth 2 (two) times daily. 04/06/17   Penumalli, Earlean Polka, MD  levothyroxine (SYNTHROID, LEVOTHROID) 100 MCG tablet Take 100 mcg by mouth every morning. 12/21/16   [provider]  metFORMIN (GLUCOPHAGE) 1000 MG tablet Take  1,000 mg by mouth 2 (two) times daily with a meal.    [provider]  morphine (MSIR) 15 MG tablet Take 1 tablet (15 mg total) by mouth every 4 (four) hours as needed for severe pain. 08/03/17   Deno Etienne, DO  Multiple Vitamins-Minerals (VISION VITAMINS PO) Take 1 tablet by mouth daily.    [provider]  ondansetron (ZOFRAN ODT) 4 MG disintegrating tablet 4mg  ODT q4 hours prn nausea/vomit 08/03/17   Deno Etienne, DO  rosuvastatin (CRESTOR) 20 MG tablet Take 20 mg by mouth daily.    [provider]  sitaGLIPtin (JANUVIA) 100 MG tablet Take 100 mg by mouth daily.    [provider]  tamsulosin (FLOMAX) 0.4 MG CAPS capsule Take 1 capsule (0.4 mg  total) by mouth daily after supper. 08/03/17   Deno Etienne, DO    Family History Family History  Problem Relation Age of Onset  . Stomach cancer Mother   . Stroke Brother   . Prostate cancer Brother   . Stroke Brother   . Multiple sclerosis Sister     Social History Social History   Tobacco Use  . Smoking status: Never Smoker  . Smokeless tobacco: Never Used  Substance Use Topics  . Alcohol use: No  . Drug use: No     Allergies   Clonidine derivatives; Glimepiride; and Invokana [canagliflozin]   Review of Systems Review of Systems  Constitutional: Negative for chills and fever.  HENT: Negative for congestion and rhinorrhea.   Eyes: Negative for redness and visual disturbance.  Respiratory: Negative for shortness of breath and wheezing.   Cardiovascular: Negative for chest pain and palpitations.  Gastrointestinal: Positive for abdominal pain. Negative for nausea and vomiting.  Genitourinary: Positive for difficulty urinating, dysuria and frequency. Negative for urgency.  Musculoskeletal: Positive for back pain. Negative for arthralgias and myalgias.  Skin: Negative for pallor and wound.  Neurological: Negative for dizziness and headaches.     Physical Exam Updated Vital Signs BP (!) 157/86  (BP Location: Left Arm)   Pulse 73   Temp (!) 97.5 F (36.4 C) (Oral)   Resp 16   Ht 5\' 4"  (1.626 m)   Wt 85.7 kg (189 lb)   LMP  (LMP Unknown)   SpO2 100%   BMI 32.44 kg/m   Physical Exam  Constitutional: She is oriented to person, place, and time. She appears well-developed and well-nourished. No distress.  HENT:  Head: Normocephalic and atraumatic.  Eyes: EOM are normal. Pupils are equal, round, and reactive to light.  Neck: Normal range of motion. Neck supple.  Cardiovascular: Normal rate and regular rhythm. Exam reveals no gallop and no friction rub.  No murmur heard. Pulmonary/Chest: Effort normal. She has no wheezes. She has no rales.  Abdominal: Soft. She exhibits no distension. There is no tenderness.  Musculoskeletal: She exhibits no edema or tenderness.  Neurological: She is alert and oriented to person, place, and time.  Skin: Skin is warm and dry. She is not diaphoretic.  Psychiatric: She has a normal mood and affect. Her behavior is normal.  Nursing note and vitals reviewed.    ED Treatments / Results  Labs (all labs ordered are listed, but only abnormal results are displayed) Labs Reviewed  URINALYSIS, ROUTINE W REFLEX MICROSCOPIC - Abnormal; Notable for the following components:      Result Value   Hgb urine dipstick TRACE (*)    Leukocytes, UA TRACE (*)    All other components within normal limits  CBC WITH DIFFERENTIAL/PLATELET - Abnormal; Notable for the following components:   Platelets 134 (*)    Lymphs Abs 0.6 (*)    All other components within normal limits  COMPREHENSIVE METABOLIC PANEL - Abnormal; Notable for the following components:   Sodium 133 (*)    Chloride 99 (*)    Glucose, Bld 173 (*)    All other components within normal limits  URINALYSIS, MICROSCOPIC (REFLEX) - Abnormal; Notable for the following components:   Bacteria, UA RARE (*)    Squamous Epithelial / LPF 0-5 (*)    All other components within normal limits  LIPASE, BLOOD      EKG  EKG Interpretation None       Radiology Ct Renal Stone Study  Result Date:  08/03/2017 CLINICAL DATA:  Lower abdominal and back pain since this morning. Feels constipated. Associated nausea. Reports a history of passing a kidney stone 10 years ago. EXAM: CT ABDOMEN AND PELVIS WITHOUT CONTRAST TECHNIQUE: Multidetector CT imaging of the abdomen and pelvis was performed following the standard protocol without IV contrast. COMPARISON:  11/04/2005 FINDINGS: Lower chest: No acute abnormality. Hepatobiliary: Liver is normal in size. No liver mass or focal lesion. Gallbladder is mildly distended. Several small densities are noted along the dependent wall suggesting small stones. No wall thickening or pericholecystic fluid. No bile duct dilation. Pancreas: Unremarkable. No pancreatic ductal dilatation or surrounding inflammatory changes. Spleen: Normal in size without focal abnormality. Adrenals/Urinary Tract: No adrenal masses. There is mild-to-moderate left hydronephrosis with significant left perinephric stranding. This is due to a line of stones extending from the inferior renal pelvis to the ureteropelvic junction. Stone at the ureteropelvic junction measures 9 mm. There is a small nonobstructing intrarenal stone in the lower pole the left kidney. No right intrarenal stones. There is mild dilation of the right renal pelvis and portions of the right ureter. This is due to a 4 mm stone just above the ureterovesicular junction. The bladder also contains a nondependent stone along its posterior wall. No renal masses.  No bladder masses or wall thickening. Stomach/Bowel: Stomach and small bowel are unremarkable. There are scattered left colon diverticula. No diverticulitis. No other colonic abnormality. Vascular/Lymphatic: No significant vascular findings are present. No enlarged abdominal or pelvic lymph nodes. Reproductive: Status post hysterectomy. No adnexal masses. Other: No abdominal wall hernia or  abnormality. No abdominopelvic ascites. Musculoskeletal: No fracture or acute finding. No osteoblastic or osteolytic lesions. Bones are demineralized. There are degenerative changes throughout the visualized spine. IMPRESSION: 1. There are bilateral ureteral stones. The most significant appears on the left, where a line of stones extends from the inferior renal pelvis to the ureteropelvic junction, stone at the UPJ measuring 9 mm, causing moderate hydronephrosis. On the right, a 4 mm stone lies distally just above the ureteropelvic junction, with mild dilation of the right renal pelvis and portions of the right ureter. 2. Small stone lies dependently in the bladder consistent with a recently passed stone. Small nonobstructing stone in the lower pole the left kidney. 3. No other acute abnormalities. 4. Probable small gallstones. 5. Aortic atherosclerosis. 6. Colonic diverticular without diverticulitis. Electronically Signed   By: Lajean Manes M.D.   On: 08/03/2017 16:54    Procedures Procedures (including critical care time)  Medications Ordered in ED Medications  morphine 4 MG/ML injection 4 mg (4 mg Intravenous Given 08/03/17 1557)  ondansetron (ZOFRAN) injection 4 mg (4 mg Intravenous Given 08/03/17 1557)  sodium chloride 0.9 % bolus 1,000 mL (0 mLs Intravenous Stopped 08/03/17 1708)  ketorolac (TORADOL) 15 MG/ML injection 15 mg (15 mg Intravenous Given 08/03/17 1741)     Initial Impression / Assessment and Plan / ED Course  I have reviewed the triage vital signs and the nursing notes.  Pertinent labs & imaging results that were available during my care of the patient were reviewed by me and considered in my medical decision making (see chart for details).     83 yo F with a chief complaint of dysuria and lower abdominal pain.  This is been going on for the past 6-12 hours or so.  She appears extremely uncomfortable on exam.  I am unable to reproduce her pain.  I am concerned that this could  be a kidney stone.  Will obtain a stone study.  Labs.  Give pain meds and reassess.  The patient is found to have multiple stones including bilateral stents.  One stone is 9 mm.  There is no significant hydronephrosis.  Her kidney function is unchanged.  Her urine is not infected.  Her pain is well controlled after medications.  I discussed follow-up with her.  She is somewhat apprehensive about going home because she is unsure if the pain come back.  I discussed return precautions.  Discharge home.  7:19 PM:  I have discussed the diagnosis/risks/treatment options with the patient and family and believe the pt to be eligible for discharge home to follow-up with PCP. We also discussed returning to the ED immediately if new or worsening sx occur. We discussed the sx which are most concerning (e.g., sudden worsening pain, fever, inability to tolerate by mouth) that necessitate immediate return. Medications administered to the patient during their visit and any new prescriptions provided to the patient are listed below.  Medications given during this visit Medications  morphine 4 MG/ML injection 4 mg (4 mg Intravenous Given 08/03/17 1557)  ondansetron (ZOFRAN) injection 4 mg (4 mg Intravenous Given 08/03/17 1557)  sodium chloride 0.9 % bolus 1,000 mL (0 mLs Intravenous Stopped 08/03/17 1708)  ketorolac (TORADOL) 15 MG/ML injection 15 mg (15 mg Intravenous Given 08/03/17 1741)     The patient appears reasonably screen and/or stabilized for discharge and I doubt any other medical condition or other [facility] requiring further screening, evaluation, or treatment in the ED at this time prior to discharge.    Final Clinical Impressions(s) / ED Diagnoses   Final diagnoses:  Nephrolithiasis    ED Discharge Orders        Ordered    tamsulosin (FLOMAX) 0.4 MG CAPS capsule  Daily after supper     08/03/17 1852    morphine (MSIR) 15 MG tablet  Every 4 hours PRN     08/03/17 1852    ondansetron (ZOFRAN ODT)  4 MG disintegrating tablet     08/03/17 1852       Deno Etienne, DO 08/03/17 1919

## 2017-08-03 NOTE — ED Triage Notes (Signed)
Abdominal and back pain since this am. She feels constipated. Nausea. States she had a BM with no improvement of pain.

## 2017-08-03 NOTE — Discharge Instructions (Signed)
Take 2 over the counter ibuprofen tablets 3 times a day or 1 over-the-counter naproxen tablets twice a day for pain. Also take tylenol 1000mg (2 extra strength) four times a day.   Then take the pain medicine if you feel like you need it. Narcotics do not help with the pain, they only make you care about it less.  You can become addicted to this, people may break into your house to steal it.  It will constipate you.  If you drive under the influence of this medicine you can get a DUI.    Return for fever or uncontrolled pain.

## 2017-08-04 DIAGNOSIS — N201 Calculus of ureter: Secondary | ICD-10-CM | POA: Diagnosis not present

## 2017-08-11 ENCOUNTER — Observation Stay (HOSPITAL_COMMUNITY): Payer: Medicare Other

## 2017-08-11 ENCOUNTER — Ambulatory Visit (HOSPITAL_COMMUNITY): Payer: Medicare Other | Admitting: Anesthesiology

## 2017-08-11 ENCOUNTER — Other Ambulatory Visit: Payer: Self-pay | Admitting: Urology

## 2017-08-11 ENCOUNTER — Observation Stay (HOSPITAL_COMMUNITY)
Admission: AD | Admit: 2017-08-11 | Discharge: 2017-08-12 | Disposition: A | Discharge status: Home / Self Care | Payer: Medicare Other | Source: Ambulatory Visit | Attending: Urology | Admitting: Urology

## 2017-08-11 ENCOUNTER — Encounter (HOSPITAL_COMMUNITY): Payer: Self-pay | Admitting: *Deleted

## 2017-08-11 ENCOUNTER — Encounter (HOSPITAL_COMMUNITY)
Admission: AD | Disposition: A | Discharge status: Home / Self Care | Payer: Self-pay | Source: Ambulatory Visit | Attending: Urology

## 2017-08-11 DIAGNOSIS — I10 Essential (primary) hypertension: Secondary | ICD-10-CM | POA: Diagnosis not present

## 2017-08-11 DIAGNOSIS — M1990 Unspecified osteoarthritis, unspecified site: Secondary | ICD-10-CM | POA: Diagnosis not present

## 2017-08-11 DIAGNOSIS — E039 Hypothyroidism, unspecified: Secondary | ICD-10-CM | POA: Diagnosis not present

## 2017-08-11 DIAGNOSIS — Z7982 Long term (current) use of aspirin: Secondary | ICD-10-CM | POA: Diagnosis not present

## 2017-08-11 DIAGNOSIS — Z79899 Other long term (current) drug therapy: Secondary | ICD-10-CM | POA: Insufficient documentation

## 2017-08-11 DIAGNOSIS — K219 Gastro-esophageal reflux disease without esophagitis: Secondary | ICD-10-CM | POA: Insufficient documentation

## 2017-08-11 DIAGNOSIS — Z6832 Body mass index (BMI) 32.0-32.9, adult: Secondary | ICD-10-CM | POA: Insufficient documentation

## 2017-08-11 DIAGNOSIS — E119 Type 2 diabetes mellitus without complications: Secondary | ICD-10-CM | POA: Diagnosis not present

## 2017-08-11 DIAGNOSIS — N132 Hydronephrosis with renal and ureteral calculous obstruction: Secondary | ICD-10-CM | POA: Diagnosis not present

## 2017-08-11 DIAGNOSIS — N201 Calculus of ureter: Secondary | ICD-10-CM | POA: Diagnosis not present

## 2017-08-11 DIAGNOSIS — N3000 Acute cystitis without hematuria: Secondary | ICD-10-CM | POA: Diagnosis not present

## 2017-08-11 DIAGNOSIS — Z888 Allergy status to other drugs, medicaments and biological substances status: Secondary | ICD-10-CM | POA: Insufficient documentation

## 2017-08-11 HISTORY — DX: Other complications of anesthesia, initial encounter: T88.59XA

## 2017-08-11 HISTORY — DX: Adverse effect of unspecified anesthetic, initial encounter: T41.45XA

## 2017-08-11 HISTORY — PX: CYSTOSCOPY/RETROGRADE/URETEROSCOPY: SHX5316

## 2017-08-11 LAB — BASIC METABOLIC PANEL
Anion gap: 6 (ref 5–15)
BUN: 9 mg/dL (ref 6–20)
CO2: 27 mmol/L (ref 22–32)
Calcium: 9.5 mg/dL (ref 8.9–10.3)
Chloride: 105 mmol/L (ref 101–111)
Creatinine, Ser: 0.6 mg/dL (ref 0.44–1.00)
GFR calc Af Amer: >60 mL/min (ref >60)
GFR calc non Af Amer: >60 mL/min (ref >60)
Glucose, Bld: 155 mg/dL — ABNORMAL HIGH (ref 65–99)
Potassium: 3.9 mmol/L (ref 3.5–5.1)
Sodium: 138 mmol/L (ref 135–145)

## 2017-08-11 LAB — GLUCOSE, CAPILLARY
Glucose-Capillary: 126 mg/dL — ABNORMAL HIGH (ref 65–99)
Glucose-Capillary: 149 mg/dL — ABNORMAL HIGH (ref 65–99)
Glucose-Capillary: 194 mg/dL — ABNORMAL HIGH (ref 65–99)

## 2017-08-11 LAB — MRSA PCR SCREENING: MRSA by PCR: POSITIVE — AB

## 2017-08-11 SURGERY — CYSTOSCOPY/RETROGRADE/URETEROSCOPY
Anesthesia: General | Site: Ureter | Laterality: Bilateral

## 2017-08-11 MED ORDER — DIPHENHYDRAMINE HCL 50 MG/ML IJ SOLN: 12.5000 mg | Freq: Four times a day (QID) | INTRAMUSCULAR | Status: DC | PRN

## 2017-08-11 MED ORDER — LIDOCAINE 2% (20 MG/ML) 5 ML SYRINGE
INTRAMUSCULAR | Status: DC | PRN
  Administered 2017-08-11: 75 mg via INTRAVENOUS

## 2017-08-11 MED ORDER — CIPROFLOXACIN IN D5W 400 MG/200ML IV SOLN
400.0000 mg | INTRAVENOUS | Status: AC
  Administered 2017-08-11: 400 mg via INTRAVENOUS
  Filled 2017-08-11: qty 200

## 2017-08-11 MED ORDER — CHLORHEXIDINE GLUCONATE CLOTH 2 % EX PADS: 6.0000 | MEDICATED_PAD | Freq: Every day | CUTANEOUS | Status: DC

## 2017-08-11 MED ORDER — ONDANSETRON HCL 4 MG/2ML IJ SOLN
INTRAMUSCULAR | Status: AC
Start: 2017-08-11 — End: 2017-08-11
  Filled 2017-08-11: qty 2

## 2017-08-11 MED ORDER — SENNA 8.6 MG PO TABS
1.0000 | ORAL_TABLET | Freq: Two times a day (BID) | ORAL | Status: DC
  Administered 2017-08-11 – 2017-08-12 (×2): 8.6 mg via ORAL
  Filled 2017-08-11 (×2): qty 1

## 2017-08-11 MED ORDER — LEVETIRACETAM 500 MG PO TABS
500.0000 mg | ORAL_TABLET | Freq: Every day | ORAL | Status: DC
  Administered 2017-08-12: 500 mg via ORAL
  Filled 2017-08-11: qty 1

## 2017-08-11 MED ORDER — BELLADONNA-OPIUM 16.2-30 MG RE SUPP: 30.0000 mg | Freq: Four times a day (QID) | RECTAL | Status: DC | PRN

## 2017-08-11 MED ORDER — IOHEXOL 300 MG/ML  SOLN
INTRAMUSCULAR | Status: DC | PRN
  Administered 2017-08-11: 50 mL

## 2017-08-11 MED ORDER — ASPIRIN EC 81 MG PO TBEC
81.0000 mg | DELAYED_RELEASE_TABLET | Freq: Every day | ORAL | Status: DC
  Administered 2017-08-12: 81 mg via ORAL
  Filled 2017-08-11: qty 1

## 2017-08-11 MED ORDER — DOCUSATE SODIUM 100 MG PO CAPS
100.0000 mg | ORAL_CAPSULE | Freq: Two times a day (BID) | ORAL | Status: DC
  Administered 2017-08-11 – 2017-08-12 (×2): 100 mg via ORAL
  Filled 2017-08-11 (×2): qty 1

## 2017-08-11 MED ORDER — LACTATED RINGERS IV SOLN
Freq: Once | INTRAVENOUS | Status: AC
  Administered 2017-08-11: 15:00:00 via INTRAVENOUS

## 2017-08-11 MED ORDER — LACTATED RINGERS IV SOLN
INTRAVENOUS | Status: DC | PRN
  Administered 2017-08-11: 15:00:00 via INTRAVENOUS

## 2017-08-11 MED ORDER — AMLODIPINE BESYLATE 10 MG PO TABS
10.0000 mg | ORAL_TABLET | Freq: Every day | ORAL | Status: DC
  Administered 2017-08-12: 10 mg via ORAL
  Filled 2017-08-11: qty 1

## 2017-08-11 MED ORDER — MUPIROCIN 2 % EX OINT
1.0000 "application " | TOPICAL_OINTMENT | Freq: Two times a day (BID) | CUTANEOUS | Status: DC
  Administered 2017-08-11 – 2017-08-12 (×2): 1 via NASAL
  Filled 2017-08-11: qty 22

## 2017-08-11 MED ORDER — DOXAZOSIN MESYLATE 4 MG PO TABS
8.0000 mg | ORAL_TABLET | Freq: Every day | ORAL | Status: DC
  Administered 2017-08-11: 8 mg via ORAL
  Filled 2017-08-11: qty 2

## 2017-08-11 MED ORDER — MORPHINE SULFATE (PF) 4 MG/ML IV SOLN
2.0000 mg | INTRAVENOUS | Status: DC | PRN
  Administered 2017-08-11 (×2): 2 mg via INTRAVENOUS
  Administered 2017-08-12: 4 mg via INTRAVENOUS
  Filled 2017-08-11 (×3): qty 1

## 2017-08-11 MED ORDER — OXYBUTYNIN CHLORIDE 5 MG PO TABS
5.0000 mg | ORAL_TABLET | Freq: Three times a day (TID) | ORAL | Status: DC | PRN
  Administered 2017-08-11 – 2017-08-12 (×2): 5 mg via ORAL
  Filled 2017-08-11 (×2): qty 1

## 2017-08-11 MED ORDER — INSULIN ASPART 100 UNIT/ML ~~LOC~~ SOLN
0.0000 [IU] | Freq: Three times a day (TID) | SUBCUTANEOUS | Status: DC
  Administered 2017-08-12: 5 [IU] via SUBCUTANEOUS

## 2017-08-11 MED ORDER — OXYCODONE HCL 5 MG PO TABS: 5.0000 mg | ORAL_TABLET | Freq: Once | ORAL | Status: DC | PRN

## 2017-08-11 MED ORDER — LEVOTHYROXINE SODIUM 100 MCG PO TABS
100.0000 ug | ORAL_TABLET | Freq: Every day | ORAL | Status: DC
  Administered 2017-08-12: 100 ug via ORAL
  Filled 2017-08-11: qty 1

## 2017-08-11 MED ORDER — DOXAZOSIN MESYLATE 4 MG PO TABS
4.0000 mg | ORAL_TABLET | Freq: Every day | ORAL | Status: DC
  Administered 2017-08-12: 4 mg via ORAL
  Filled 2017-08-11: qty 1

## 2017-08-11 MED ORDER — ROSUVASTATIN CALCIUM 20 MG PO TABS
20.0000 mg | ORAL_TABLET | Freq: Every day | ORAL | Status: DC
  Administered 2017-08-11 – 2017-08-12 (×2): 20 mg via ORAL
  Filled 2017-08-11 (×2): qty 1

## 2017-08-11 MED ORDER — DIPHENHYDRAMINE HCL 12.5 MG/5ML PO ELIX: 12.5000 mg | ORAL_SOLUTION | Freq: Four times a day (QID) | ORAL | Status: DC | PRN

## 2017-08-11 MED ORDER — STERILE WATER FOR IRRIGATION IR SOLN
Status: DC | PRN
  Administered 2017-08-11: 3000 mL

## 2017-08-11 MED ORDER — IRBESARTAN 150 MG PO TABS
150.0000 mg | ORAL_TABLET | Freq: Two times a day (BID) | ORAL | Status: DC
  Administered 2017-08-11 – 2017-08-12 (×2): 150 mg via ORAL
  Filled 2017-08-11 (×2): qty 1

## 2017-08-11 MED ORDER — DOXAZOSIN MESYLATE 4 MG PO TABS: 4.0000 mg | ORAL_TABLET | Freq: Every day | ORAL | Status: DC

## 2017-08-11 MED ORDER — DEXAMETHASONE SODIUM PHOSPHATE 10 MG/ML IJ SOLN
INTRAMUSCULAR | Status: AC
  Filled 2017-08-11: qty 1

## 2017-08-11 MED ORDER — OXYCODONE HCL 5 MG PO TABS
5.0000 mg | ORAL_TABLET | ORAL | Status: DC | PRN
  Administered 2017-08-12: 5 mg via ORAL
  Filled 2017-08-11: qty 1

## 2017-08-11 MED ORDER — ONDANSETRON HCL 4 MG/2ML IJ SOLN
INTRAMUSCULAR | Status: DC | PRN
  Administered 2017-08-11: 4 mg via INTRAVENOUS

## 2017-08-11 MED ORDER — AZELASTINE HCL 0.1 % NA SOLN
2.0000 | Freq: Every day | NASAL | Status: DC
  Filled 2017-08-11: qty 30

## 2017-08-11 MED ORDER — DEXAMETHASONE SODIUM PHOSPHATE 10 MG/ML IJ SOLN
INTRAMUSCULAR | Status: DC | PRN
Start: 2017-08-11 — End: 2017-08-11
  Administered 2017-08-11: 5 mg via INTRAVENOUS

## 2017-08-11 MED ORDER — ALPRAZOLAM 0.5 MG PO TABS
0.5000 mg | ORAL_TABLET | Freq: Every day | ORAL | Status: DC
  Administered 2017-08-11: 0.5 mg via ORAL
  Filled 2017-08-11: qty 1

## 2017-08-11 MED ORDER — OXYCODONE HCL 5 MG/5ML PO SOLN
5.0000 mg | Freq: Once | ORAL | Status: DC | PRN
Start: 2017-08-11 — End: 2017-08-11

## 2017-08-11 MED ORDER — FENTANYL CITRATE (PF) 100 MCG/2ML IJ SOLN: 25.0000 ug | INTRAMUSCULAR | Status: DC | PRN

## 2017-08-11 MED ORDER — ONDANSETRON 4 MG PO TBDP: 4.0000 mg | ORAL_TABLET | Freq: Three times a day (TID) | ORAL | Status: DC | PRN

## 2017-08-11 MED ORDER — PROPOFOL 10 MG/ML IV BOLUS
INTRAVENOUS | Status: AC
  Filled 2017-08-11: qty 20

## 2017-08-11 MED ORDER — SODIUM CHLORIDE 0.9 % IV SOLN
INTRAVENOUS | Status: DC
  Administered 2017-08-11: 19:00:00 via INTRAVENOUS

## 2017-08-11 MED ORDER — FUROSEMIDE 20 MG PO TABS
20.0000 mg | ORAL_TABLET | Freq: Every day | ORAL | Status: DC
  Administered 2017-08-12: 20 mg via ORAL
  Filled 2017-08-11: qty 1

## 2017-08-11 MED ORDER — LIDOCAINE 2% (20 MG/ML) 5 ML SYRINGE
INTRAMUSCULAR | Status: AC
  Filled 2017-08-11: qty 5

## 2017-08-11 MED ORDER — CIPROFLOXACIN HCL 500 MG PO TABS
500.0000 mg | ORAL_TABLET | Freq: Two times a day (BID) | ORAL | Status: DC
  Administered 2017-08-12: 500 mg via ORAL
  Filled 2017-08-11: qty 1

## 2017-08-11 MED ORDER — PROPOFOL 10 MG/ML IV BOLUS
INTRAVENOUS | Status: DC | PRN
  Administered 2017-08-11: 150 mg via INTRAVENOUS

## 2017-08-11 MED ORDER — PANTOPRAZOLE SODIUM 40 MG PO TBEC: 40.0000 mg | DELAYED_RELEASE_TABLET | Freq: Every day | ORAL | Status: DC | PRN

## 2017-08-11 MED ORDER — ACETAMINOPHEN 325 MG PO TABS: 650.0000 mg | ORAL_TABLET | ORAL | Status: DC | PRN

## 2017-08-11 MED ORDER — FENTANYL CITRATE (PF) 100 MCG/2ML IJ SOLN
INTRAMUSCULAR | Status: DC | PRN
  Administered 2017-08-11 (×2): 50 ug via INTRAVENOUS

## 2017-08-11 MED ORDER — FENTANYL CITRATE (PF) 100 MCG/2ML IJ SOLN
INTRAMUSCULAR | Status: AC
  Filled 2017-08-11: qty 2

## 2017-08-11 MED ORDER — TAMSULOSIN HCL 0.4 MG PO CAPS
0.4000 mg | ORAL_CAPSULE | Freq: Every day | ORAL | Status: DC
  Administered 2017-08-11: 0.4 mg via ORAL
  Filled 2017-08-11: qty 1

## 2017-08-11 SURGICAL SUPPLY — 22 items
BAG URO CATCHER STRL LF (MISCELLANEOUS) ×3 IMPLANT
BASKET LASER NITINOL 1.9FR (BASKET) ×3 IMPLANT
BASKET ZERO TIP NITINOL 2.4FR (BASKET) IMPLANT
CATH INTERMIT  6FR 70CM (CATHETERS) ×3 IMPLANT
CATH URET 5FR 28IN CONE TIP (BALLOONS)
CATH URET 5FR 70CM CONE TIP (BALLOONS) IMPLANT
CLOTH BEACON ORANGE TIMEOUT ST (SAFETY) ×6 IMPLANT
COVER FOOTSWITCH UNIV (MISCELLANEOUS) ×3 IMPLANT
FIBER LASER FLEXIVA 365 (UROLOGICAL SUPPLIES) IMPLANT
FIBER LASER TRAC TIP (UROLOGICAL SUPPLIES) IMPLANT
GOWN STRL REUS W/TWL LRG LVL3 (GOWN DISPOSABLE) ×3 IMPLANT
GOWN STRL REUS W/TWL XL LVL3 (GOWN DISPOSABLE) IMPLANT
GUIDEWIRE ANG ZIPWIRE 038X150 (WIRE) IMPLANT
GUIDEWIRE STR DUAL SENSOR (WIRE) ×3 IMPLANT
INFUSOR MANOMETER BAG 3000ML (MISCELLANEOUS) IMPLANT
MANIFOLD NEPTUNE II (INSTRUMENTS) ×3 IMPLANT
PACK CYSTO (CUSTOM PROCEDURE TRAY) ×3 IMPLANT
SHEATH ACCESS URETERAL 24CM (SHEATH) IMPLANT
SHEATH ACCESS URETERAL 54CM (SHEATH) IMPLANT
SHEATH URETERAL 12FRX35CM (MISCELLANEOUS) IMPLANT
SYRINGE 10CC LL (SYRINGE) ×3 IMPLANT
TUBE FEEDING 8FR 16IN STR KANG (MISCELLANEOUS) IMPLANT

## 2017-08-11 NOTE — Anesthesia Procedure Notes (Signed)
Procedure Name: LMA Insertion Date/Time: 08/11/2017 4:32 PM Performed by: Lollie Sails, CRNA Pre-anesthesia Checklist: Patient identified, Emergency Drugs available, Suction available, Patient being monitored and Timeout performed Patient Re-evaluated:Patient Re-evaluated prior to induction Oxygen Delivery Method: Circle system utilized Preoxygenation: Pre-oxygenation with 100% oxygen Induction Type: IV induction Ventilation: Mask ventilation without difficulty LMA: LMA inserted LMA Size: 4.0 Number of attempts: 1 Placement Confirmation: positive ETCO2 and breath sounds checked- equal and bilateral Tube secured with: Tape Dental Injury: Teeth and Oropharynx as per pre-operative assessment

## 2017-08-11 NOTE — Interval H&P Note (Signed)
History and Physical Interval Note:  08/11/2017 4:15 PM  Hailey [facility]  has presented today for surgery, with the diagnosis of bilateral ureteral stone  The various methods of treatment have been discussed with the patient and family. After consideration of risks, benefits and other options for treatment, the patient has consented to  Procedure(s): CYSTOSCOPY/BILATERAL RETROGRADE AND BILATERAL STENT PLACEMENT (Bilateral) as a surgical intervention .  The patient's history has been reviewed, patient examined, no change in status, stable for surgery.  I have reviewed the patient's chart and labs.  Questions were answered to the patient's satisfaction.     Marton Redwood, III

## 2017-08-11 NOTE — Progress Notes (Signed)
Received patient to room from PACU alert and oriented to room History obtained. Voicing no complaints at present. Eulas Post, RN

## 2017-08-11 NOTE — Op Note (Signed)
Operative Note  Preoperative diagnosis:  1.  Bilateral ureteral calculus  Post operative diagnosis: 1.  Bilateral ureteral calculus  Procedure(s): 1.  Cystoscopy with bilateral retrograde pyelogram and bilateral ureteral stent placement  Surgeon: Link Snuffer, MD  Assistants: None  Anesthesia: General  Complications: None immediate  EBL: Minimal  Specimens: 1.  None  Drains/Catheters: 1.  Bilateral 6 X 24 double-J ureteral stent  Intraoperative findings: 1.  Normal urethra and bladder 2.  Left retrograde pyelogram revealed mild upstream hydroureteronephrosis. 3.  Right retrograde pyelogram revealed a filling defect at the level of the renal pelvis.  There was moderate to severe right-sided hydronephrosis.  Indication: 83 year old female with bilateral ureteral calculi.  She was also complaining of subjective fevers.  Her urinalysis was consistent with possible urinary tract infection.  Given these findings, the decision was made to proceed urgently for bilateral ureteral stent placement.  Description of procedure:  The patient was identified and consent was obtained.  The patient was taken to the operating room and placed in the supine position.  The patient was placed under general anesthesia.  Perioperative antibiotics were administered.  The patient was placed in dorsal lithotomy.  Patient was prepped and draped in a standard sterile fashion and a timeout was performed.  A 21 French rigid cystoscope was advanced into the urethra and into the bladder.  The left distal most portion of the ureter was cannulated with an open-ended ureteral catheter.  Retrograde pyelogram was performed with the findings noted above.  A sensor wire was then advanced up to the kidney under fluoroscopic guidance.  A 6 X 24 double-J ureteral stent was advanced up to the kidney under fluoroscopic guidance.  The wire was withdrawn and fluoroscopy confirmed good proximal placement and direct visualization  confirmed a good coil within the bladder.  I then performed the exact same operation on the right side.  The findings are noted above.  The bladder was drained and the scope withdrawn.  This concluded the operation.  Patient tolerated procedure well and was stable postoperatively.  Plan: Observe the patient overnight.  Continue ciprofloxacin.  Hopefully discharge in the morning.

## 2017-08-11 NOTE — Anesthesia Preprocedure Evaluation (Signed)
Anesthesia Evaluation  Patient identified by MRN, date of birth, ID band Patient awake    Reviewed: Allergy & Precautions, NPO status , Patient's Chart, lab work & pertinent test results  History of Anesthesia Complications Negative for: history of anesthetic complications  Airway Mallampati: II  TM Distance: >3 FB Neck ROM: Full    Dental  (+) Chipped, Missing, Poor Dentition,    Pulmonary shortness of breath, neg sleep apnea, neg COPD, neg recent URI,    breath sounds clear to auscultation       Cardiovascular hypertension, Pt. on medications (-) angina(-) Past MI and (-) CHF + dysrhythmias + Valvular Problems/Murmurs AS  Rhythm:Regular  Moderate AS on last echo   Neuro/Psych Seizures -, Well Controlled,  PSYCHIATRIC DISORDERS Anxiety    GI/Hepatic GERD  Medicated and Controlled,(+) Hepatitis -, Autoimmune  Endo/Other  diabetes, Type 2Hypothyroidism Morbid obesity  Renal/GU      Musculoskeletal  (+) Arthritis ,   Abdominal   Peds  Hematology negative hematology ROS (+)   Anesthesia Other Findings   Reproductive/Obstetrics                             Anesthesia Physical Anesthesia Plan  ASA: III  Anesthesia Plan: General   Post-op Pain Management:    Induction: Intravenous  PONV Risk Score and Plan: 3 and Ondansetron, Dexamethasone and Treatment may vary due to age or medical condition  Airway Management Planned: LMA  Additional Equipment: None  Intra-op Plan:   Post-operative Plan: Extubation in OR  Informed Consent: I have reviewed the patients History and Physical, chart, labs and discussed the procedure including the risks, benefits and alternatives for the proposed anesthesia with the patient or authorized representative who has indicated his/her understanding and acceptance.   Dental advisory given  Plan Discussed with: CRNA and Surgeon  Anesthesia Plan Comments:           Anesthesia Quick Evaluation

## 2017-08-11 NOTE — H&P (Signed)
CC: I have kidney stones.  HPI: Hailey Cobb is a 83 year-old female established patient who is here for renal calculi.  08/04/17: 83 year old female with a distant history of urolithiasis presents urgently today with sudden onset of right flank pain yesterday. She went to the emergency room at [facility] and was found to have a 4 mm right distal ureteral stone with hydronephrosis. Additionally, she had a left UPJ stone that is relatively asymptomatic at this point. She denies fever, she has had frequency and urgency as well as mild dysuria. Currently, she is in moderate discomfort.    08/11/17: Continues to have intermittent right-sided pain and discomfort with associated bothersome lower urinary tract symptoms. She's developed increased nausea as well as what she describes as fever and chills although fever has not been documented. Denies interval passage of any stone material. Patient has bilateral calculi at the UPJ of the left and UVJ on the right.   The problem is on both sides. She first stated noticing pain on approximately 08/03/2017. This is not her first kidney stone. She has had 1 stones prior to getting this one. She is currently having flank pain, back pain, groin pain, nausea, fever, and chills. She denies having vomiting. She has not caught a stone in her urine strainer since her symptoms began.   She has never had surgical treatment for calculi in the past.     ALLERGIES: Codeine - Dizziness glimepiride - hypoglycemia Invokana - weakness, perineal irritation    MEDICATIONS: Levothyroxine Sodium 100 mcg tablet  Metformin Hcl 1,000 mg tablet  Nexium 40 mg capsule,delayed release  Tamsulosin Hcl 0.4 mg capsule  Alprazolam 0.5 mg tablet  Amlodipine Besylate 10 mg tablet  Aspir 81  Ciclopirox 0.77 % cream  Doxazosin Mesylate 2 mg tablet  Furosemide 40 mg tablet  Hydroxyzine Hcl 25 mg tablet  Irbesartan 150 mg tablet  Januvia 100 mg tablet  Levetiracetam 500 mg  tablet  Morphine Sulfate 15 mg tablet  Morphine Sulfate Er 15 mg tablet, extended release 1 tablet PO Q 12 H prn pain  Norco 5 mg-325 mg tablet  Rosuvastatin Calcium 20 mg tablet  Zofran 4 mg tablet     GU PSH: Hysterectomy    NON-GU PSH: Knee Arthroscopy Remove Thyroid Shoulder Arthroscopy/surgery    GU PMH: Ureteral calculus, Bilateral, Right distal ureteral calculus, left UPJ stone (this is asymptomatic). Currently, no evidence of infection and patient fairly comfortable. - 08/04/2017    NON-GU PMH: Anxiety Arthritis Diabetes Type 2 GERD Hypercholesterolemia Hypertension Hyperthyroidism Hypothyroidism Seizure disorder Sleep Apnea    FAMILY HISTORY: Cancer - Mother Heart Disease - Sister Hypercholesterolemia - Brother Hypertension - Brother nephrolithiasis - Runs in Family stroke - Brother   SOCIAL HISTORY: Marital Status: Unknown Preferred Language: English; Ethnicity: Not Hispanic Or Latino; Race: White Current Smoking Status: Patient has never smoked.   Tobacco Use Assessment Completed: Used Tobacco in last 30 days? Has never drank.  Drinks 1 caffeinated drink per day. 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    REVIEW OF SYSTEMS:    GU Review Female:   Patient reports frequent urination, hard to postpone urination, get up at night to urinate, leakage of urine, and stream starts and stops. Patient denies burning /pain with urination, trouble starting your stream, have to strain to urinate, and being pregnant.  Gastrointestinal (Upper):   Patient reports nausea. Patient denies vomiting and indigestion/ heartburn.  Gastrointestinal (Lower):   Patient reports diarrhea. Patient denies constipation.  Constitutional:   Patient reports fatigue. Patient denies fever, night sweats, and weight loss.  Skin:   Patient denies skin rash/ lesion and itching.  Eyes:   Patient denies blurred vision and double vision.  Ears/ Nose/ Throat:   Patient denies sore  throat and sinus problems.  Hematologic/Lymphatic:   Patient denies swollen glands and easy bruising.  Cardiovascular:   Patient denies chest pains and leg swelling.  Respiratory:   Patient reports shortness of breath. Patient denies cough.  Endocrine:   Patient denies excessive thirst.  Musculoskeletal:   Patient reports back pain and joint pain.   Neurological:   Patient denies headaches and dizziness.  Psychologic:   Patient reports depression and anxiety.    VITAL SIGNS:      08/11/2017 01:05 PM  BP 123/78 mmHg  Pulse 93 /min  Temperature 98.0 F / 36.6 C   MULTI-SYSTEM PHYSICAL EXAMINATION:    Constitutional: Well-nourished. No physical deformities. Normally developed. Good grooming.  Neck: Neck symmetrical, not swollen. Normal tracheal position.  Respiratory: Normal breath sounds. No labored breathing, no use of accessory muscles.   Cardiovascular: Regular rate and rhythm. No murmur, no gallop. Normal temperature, normal extremity pulses, no swelling, no varicosities.   Skin: No paleness, no jaundice, no cyanosis. No lesion, no ulcer, no rash.  Neurologic / Psychiatric: Oriented to time, oriented to place, oriented to person. No depression, no anxiety, no agitation.  Gastrointestinal: No mass, no tenderness, no rigidity, non obese abdomen. No CVAT. No flank or suprapubic tenderness.     PAST DATA REVIEWED:  Source Of History:  Patient, Family/Caregiver  Records Review:   Previous Patient Records  Urine Test Review:   Urinalysis  X-Ray Review: KUB: Reviewed Films. Discussed With Patient.     08/11/17  Urinalysis  Urine Appearance Cloudy   Urine Color Yellow   Urine Glucose Neg   Urine Bilirubin Neg   Urine Ketones Neg   Urine Specific Gravity 1.020   Urine Blood 1+   Urine pH 6.5   Urine Protein 1+   Urine Urobilinogen 0.2   Urine Nitrites Neg   Urine Leukocyte Esterase 3+   Urine WBC/hpf >60/hpf   Urine RBC/hpf 3 - 10/hpf   Urine Epithelial Cells 0 - 5/hpf   Urine  Bacteria Mod (26-50/hpf)   Urine Mucous Not Present   Urine Yeast NS (Not Seen)   Urine Trichomonas Not Present   Urine Cystals NS (Not Seen)   Urine Casts NS (Not Seen)   Urine Sperm Not Present    PROCEDURES:         KUB - [id number]  A single view of the abdomen is obtained. The right renal shadow was not clearly visualized due to overlying bowel gas and soft tissue structures. I can't make out a definitive opacity that suggest obvious renal or ureteral obstruction. There is a small 3-4 mm opacity at the anatomical location of the distal right uvj although quite low within the pelvic rim but does correlate with the right ureteral calculi's location on CT imaging. There is a linear-shaped calcification stretching from the UPJ down the left proximal ureter consistent with the stone noted on CT imaging. Measuring proximal to distal ends, the opacity stretches approximately 30 mm long. It was measured close to 1 cm in size on CT imaging. No other renal or ureteral calculi noted on the left side. Bladder grossly appears free of obstruction but again there is a prominent overlying bowel gas pattern.  Urinalysis w/Scope Dipstick Dipstick Cont'd Micro  Color: Yellow Bilirubin: Neg WBC/hpf: >60/hpf  Appearance: Cloudy Ketones: Neg RBC/hpf: 3 - 10/hpf  Specific Gravity: 1.020 Blood: 1+ Bacteria: Mod (26-50/hpf)  pH: 6.5 Protein: 1+ Cystals: NS (Not Seen)  Glucose: Neg Urobilinogen: 0.2 Casts: NS (Not Seen)    Nitrites: Neg Trichomonas: Not Present    Leukocyte Esterase: 3+ Mucous: Not Present      Epithelial Cells: 0 - 5/hpf      Yeast: NS (Not Seen)      Sperm: Not Present    ASSESSMENT:      ICD-10 Details  1 GU:   Ureteral calculus - N20.1 Bilateral  2   Acute Cystitis/UTI - N30.00    PLAN:           Orders Labs Urine Culture          Schedule Return Visit/Planned Activity: ASAP - Schedule Surgery  Return Visit/Planned Activity: 1-2 Weeks - Follow up MD           Document Letter(s):  Created for Patient: Clinical Summary         Notes:   I spoke with the on-call urologist about findings today. I'm fearful patient may be developing urosepsis from bilateral ureteral calculi. Urinalysis suspicious for infection and she has subjective signs indicating this as well. Discussed risks, benefits, and advantage of proceeding with bilateral ureteral stenting with patient and her daughter. They're agreeable with plan of care moving forward. She will need to follow up with her urologist one to 2 weeks after tonight's procedure for further planning of care.    Signed by Jiles Crocker on 08/11/17 at 1:39 PM (EST

## 2017-08-11 NOTE — H&P (View-Only) (Signed)
CC: I have kidney stones.  HPI: Hailey Cobb is a 83 year-old female established patient who is here for renal calculi.  08/04/17: 83 year old female with a distant history of urolithiasis presents urgently today with sudden onset of right flank pain yesterday. She went to the emergency room at [facility] and was found to have a 4 mm right distal ureteral stone with hydronephrosis. Additionally, she had a left UPJ stone that is relatively asymptomatic at this point. She denies fever, she has had frequency and urgency as well as mild dysuria. Currently, she is in moderate discomfort.    08/11/17: Continues to have intermittent right-sided pain and discomfort with associated bothersome lower urinary tract symptoms. She's developed increased nausea as well as what she describes as fever and chills although fever has not been documented. Denies interval passage of any stone material. Patient has bilateral calculi at the UPJ of the left and UVJ on the right.   The problem is on both sides. She first stated noticing pain on approximately 08/03/2017. This is not her first kidney stone. She has had 1 stones prior to getting this one. She is currently having flank pain, back pain, groin pain, nausea, fever, and chills. She denies having vomiting. She has not caught a stone in her urine strainer since her symptoms began.   She has never had surgical treatment for calculi in the past.     ALLERGIES: Codeine - Dizziness glimepiride - hypoglycemia Invokana - weakness, perineal irritation    MEDICATIONS: Levothyroxine Sodium 100 mcg tablet  Metformin Hcl 1,000 mg tablet  Nexium 40 mg capsule,delayed release  Tamsulosin Hcl 0.4 mg capsule  Alprazolam 0.5 mg tablet  Amlodipine Besylate 10 mg tablet  Aspir 81  Ciclopirox 0.77 % cream  Doxazosin Mesylate 2 mg tablet  Furosemide 40 mg tablet  Hydroxyzine Hcl 25 mg tablet  Irbesartan 150 mg tablet  Januvia 100 mg tablet  Levetiracetam 500 mg  tablet  Morphine Sulfate 15 mg tablet  Morphine Sulfate Er 15 mg tablet, extended release 1 tablet PO Q 12 H prn pain  Norco 5 mg-325 mg tablet  Rosuvastatin Calcium 20 mg tablet  Zofran 4 mg tablet     GU PSH: Hysterectomy    NON-GU PSH: Knee Arthroscopy Remove Thyroid Shoulder Arthroscopy/surgery    GU PMH: Ureteral calculus, Bilateral, Right distal ureteral calculus, left UPJ stone (this is asymptomatic). Currently, no evidence of infection and patient fairly comfortable. - 08/04/2017    NON-GU PMH: Anxiety Arthritis Diabetes Type 2 GERD Hypercholesterolemia Hypertension Hyperthyroidism Hypothyroidism Seizure disorder Sleep Apnea    FAMILY HISTORY: Cancer - Mother Heart Disease - Sister Hypercholesterolemia - Brother Hypertension - Brother nephrolithiasis - Runs in Family stroke - Brother   SOCIAL HISTORY: Marital Status: Unknown Preferred Language: English; Ethnicity: Not Hispanic Or Latino; Race: White Current Smoking Status: Patient has never smoked.   Tobacco Use Assessment Completed: Used Tobacco in last 30 days? Has never drank.  Drinks 1 caffeinated drink per day. 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    REVIEW OF SYSTEMS:    GU Review Female:   Patient reports frequent urination, hard to postpone urination, get up at night to urinate, leakage of urine, and stream starts and stops. Patient denies burning /pain with urination, trouble starting your stream, have to strain to urinate, and being pregnant.  Gastrointestinal (Upper):   Patient reports nausea. Patient denies vomiting and indigestion/ heartburn.  Gastrointestinal (Lower):   Patient reports diarrhea. Patient denies constipation.  Constitutional:   Patient reports fatigue. Patient denies fever, night sweats, and weight loss.  Skin:   Patient denies skin rash/ lesion and itching.  Eyes:   Patient denies blurred vision and double vision.  Ears/ Nose/ Throat:   Patient denies sore  throat and sinus problems.  Hematologic/Lymphatic:   Patient denies swollen glands and easy bruising.  Cardiovascular:   Patient denies chest pains and leg swelling.  Respiratory:   Patient reports shortness of breath. Patient denies cough.  Endocrine:   Patient denies excessive thirst.  Musculoskeletal:   Patient reports back pain and joint pain.   Neurological:   Patient denies headaches and dizziness.  Psychologic:   Patient reports depression and anxiety.    VITAL SIGNS:      08/11/2017 01:05 PM  BP 123/78 mmHg  Pulse 93 /min  Temperature 98.0 F / 36.6 C   MULTI-SYSTEM PHYSICAL EXAMINATION:    Constitutional: Well-nourished. No physical deformities. Normally developed. Good grooming.  Neck: Neck symmetrical, not swollen. Normal tracheal position.  Respiratory: Normal breath sounds. No labored breathing, no use of accessory muscles.   Cardiovascular: Regular rate and rhythm. No murmur, no gallop. Normal temperature, normal extremity pulses, no swelling, no varicosities.   Skin: No paleness, no jaundice, no cyanosis. No lesion, no ulcer, no rash.  Neurologic / Psychiatric: Oriented to time, oriented to place, oriented to person. No depression, no anxiety, no agitation.  Gastrointestinal: No mass, no tenderness, no rigidity, non obese abdomen. No CVAT. No flank or suprapubic tenderness.     PAST DATA REVIEWED:  Source Of History:  Patient, Family/Caregiver  Records Review:   Previous Patient Records  Urine Test Review:   Urinalysis  X-Ray Review: KUB: Reviewed Films. Discussed With Patient.     08/11/17  Urinalysis  Urine Appearance Cloudy   Urine Color Yellow   Urine Glucose Neg   Urine Bilirubin Neg   Urine Ketones Neg   Urine Specific Gravity 1.020   Urine Blood 1+   Urine pH 6.5   Urine Protein 1+   Urine Urobilinogen 0.2   Urine Nitrites Neg   Urine Leukocyte Esterase 3+   Urine WBC/hpf >60/hpf   Urine RBC/hpf 3 - 10/hpf   Urine Epithelial Cells 0 - 5/hpf   Urine  Bacteria Mod (26-50/hpf)   Urine Mucous Not Present   Urine Yeast NS (Not Seen)   Urine Trichomonas Not Present   Urine Cystals NS (Not Seen)   Urine Casts NS (Not Seen)   Urine Sperm Not Present    PROCEDURES:         KUB - [id number]  A single view of the abdomen is obtained. The right renal shadow was not clearly visualized due to overlying bowel gas and soft tissue structures. I can't make out a definitive opacity that suggest obvious renal or ureteral obstruction. There is a small 3-4 mm opacity at the anatomical location of the distal right uvj although quite low within the pelvic rim but does correlate with the right ureteral calculi's location on CT imaging. There is a linear-shaped calcification stretching from the UPJ down the left proximal ureter consistent with the stone noted on CT imaging. Measuring proximal to distal ends, the opacity stretches approximately 30 mm long. It was measured close to 1 cm in size on CT imaging. No other renal or ureteral calculi noted on the left side. Bladder grossly appears free of obstruction but again there is a prominent overlying bowel gas pattern.  Urinalysis w/Scope Dipstick Dipstick Cont'd Micro  Color: Yellow Bilirubin: Neg WBC/hpf: >60/hpf  Appearance: Cloudy Ketones: Neg RBC/hpf: 3 - 10/hpf  Specific Gravity: 1.020 Blood: 1+ Bacteria: Mod (26-50/hpf)  pH: 6.5 Protein: 1+ Cystals: NS (Not Seen)  Glucose: Neg Urobilinogen: 0.2 Casts: NS (Not Seen)    Nitrites: Neg Trichomonas: Not Present    Leukocyte Esterase: 3+ Mucous: Not Present      Epithelial Cells: 0 - 5/hpf      Yeast: NS (Not Seen)      Sperm: Not Present    ASSESSMENT:      ICD-10 Details  1 GU:   Ureteral calculus - N20.1 Bilateral  2   Acute Cystitis/UTI - N30.00    PLAN:           Orders Labs Urine Culture          Schedule Return Visit/Planned Activity: ASAP - Schedule Surgery  Return Visit/Planned Activity: 1-2 Weeks - Follow up MD           Document Letter(s):  Created for Patient: Clinical Summary         Notes:   I spoke with the on-call urologist about findings today. I'm fearful patient may be developing urosepsis from bilateral ureteral calculi. Urinalysis suspicious for infection and she has subjective signs indicating this as well. Discussed risks, benefits, and advantage of proceeding with bilateral ureteral stenting with patient and her daughter. They're agreeable with plan of care moving forward. She will need to follow up with her urologist one to 2 weeks after tonight's procedure for further planning of care.    Signed by Jiles Crocker on 08/11/17 at 1:39 PM (EST

## 2017-08-11 NOTE — Transfer of Care (Signed)
Immediate Anesthesia Transfer of Care Note  Patient: Hailey Cobb  Procedure(s) Performed: CYSTOSCOPY/BILATERAL RETROGRADE AND BILATERAL STENT PLACEMENT (Bilateral Ureter)  Patient Location: PACU  Anesthesia Type:General  Level of Consciousness: awake, alert  and patient cooperative  Airway & Oxygen Therapy: Patient Spontanous Breathing and Patient connected to face mask oxygen  Post-op Assessment: Report given to RN and Post -op Vital signs reviewed and stable  Post vital signs: Reviewed and stable  Last Vitals:  Vitals:   08/11/17 1423  BP: (!) 148/75  Pulse: 82  Resp: 18  Temp: (!) 36.3 C  SpO2: 99%    Last Pain:  Vitals:   08/11/17 1423  TempSrc: Oral  PainSc: 3       Patients Stated Pain Goal: 4 (10/15/29 4388)  Complications: No apparent anesthesia complications

## 2017-08-12 ENCOUNTER — Encounter (HOSPITAL_COMMUNITY): Payer: Self-pay | Admitting: Urology

## 2017-08-12 ENCOUNTER — Other Ambulatory Visit: Payer: Self-pay

## 2017-08-12 DIAGNOSIS — K219 Gastro-esophageal reflux disease without esophagitis: Secondary | ICD-10-CM | POA: Diagnosis not present

## 2017-08-12 DIAGNOSIS — N3000 Acute cystitis without hematuria: Secondary | ICD-10-CM | POA: Diagnosis not present

## 2017-08-12 DIAGNOSIS — E119 Type 2 diabetes mellitus without complications: Secondary | ICD-10-CM | POA: Diagnosis not present

## 2017-08-12 DIAGNOSIS — I10 Essential (primary) hypertension: Secondary | ICD-10-CM | POA: Diagnosis not present

## 2017-08-12 DIAGNOSIS — N201 Calculus of ureter: Secondary | ICD-10-CM | POA: Diagnosis not present

## 2017-08-12 DIAGNOSIS — N132 Hydronephrosis with renal and ureteral calculous obstruction: Secondary | ICD-10-CM | POA: Diagnosis not present

## 2017-08-12 DIAGNOSIS — E039 Hypothyroidism, unspecified: Secondary | ICD-10-CM | POA: Diagnosis not present

## 2017-08-12 LAB — GLUCOSE, CAPILLARY: Glucose-Capillary: 200 mg/dL — ABNORMAL HIGH (ref 65–99)

## 2017-08-12 MED ORDER — CIPROFLOXACIN HCL 500 MG PO TABS: 500.0000 mg | ORAL_TABLET | Freq: Two times a day (BID) | ORAL | 0 refills | Status: DC

## 2017-08-12 MED ORDER — OXYBUTYNIN CHLORIDE 5 MG PO TABS: 5.0000 mg | ORAL_TABLET | Freq: Three times a day (TID) | ORAL | 3 refills | Status: DC | PRN

## 2017-08-12 MED ORDER — OXYCODONE HCL 5 MG PO TABS: 5.0000 mg | ORAL_TABLET | ORAL | 0 refills | Status: DC | PRN

## 2017-08-12 NOTE — Discharge Instructions (Signed)
[facility] [phone] Post Ureteroscopy With or Without Stent Instructions  Definitions:  Ureter: The duct that transports urine from the kidney to the bladder. Stent:   A plastic hollow tube that is placed into the ureter, from the kidney to the                 bladder to prevent the ureter from swelling shut.  GENERAL INSTRUCTIONS:  Complete your antibiotics as scheduled/directed  Despite the fact that no skin incisions were used, the area around the ureter and bladder is raw and irritated. The stent is a foreign body which will further irritate the bladder wall. This irritation is manifested by increased frequency of urination, both day and night, and by an increase in the urge to urinate. In some, the urge to urinate is present almost always. Sometimes the urge is strong enough that you may not be able to stop yourself from urinating. The only real cure is to remove the stent and then give time for the bladder wall to heal which can't be done until the danger of the ureter swelling shut has passed, which varies.  You may see some blood in your urine while the stent is in place and a few days afterwards. Do not be alarmed, even if the urine was clear for a while. Get off your feet and drink lots of fluids until clearing occurs. If you start to pass clots or don't improve, call us.  DIET: You may return to your normal diet immediately. Because of the raw surface of your bladder, alcohol, spicy foods, acid type foods and drinks with caffeine may cause irritation or frequency and should be used in moderation. To keep your urine flowing freely and to avoid constipation, drink plenty of fluids during the day ( 8-10 glasses ). Tip: Avoid cranberry juice because it is very acidic.  ACTIVITY: Your physical activity doesn't need to be restricted. However, if you are very active, you may see some blood in your urine. We suggest that you reduce your activity under these  circumstances until the bleeding has stopped.  BOWELS: It is important to keep your bowels regular during the postoperative period. Straining with bowel movements can cause bleeding. A bowel movement every other day is reasonable. Use a mild laxative if needed, such as Milk of Magnesia 2-3 tablespoons, or 2 Dulcolax tablets. Call if you continue to have problems. If you have been taking narcotics for pain, before, during or after your surgery, you may be constipated. Take a laxative if necessary.   MEDICATION: You should resume your pre-surgery medications unless told not to. You may take oxybutynin or flomax if prescribed for bladder spasms or discomfort from the stent Take pain medication as directed for pain refractory to conservative management  PROBLEMS YOU SHOULD REPORT TO [place]:  Fevers over 100.5 Fahrenheit.  Heavy bleeding, or clots ( See above notes about blood in urine ).  Inability to urinate.  Drug reactions ( hives, rash, nausea, vomiting, diarrhea ).  Severe burning or pain with urination that is not improving.

## 2017-08-12 NOTE — Discharge Summary (Signed)
Physician Discharge Summary  Patient ID: Hailey Cobb MRN: [id number] DOB/AGE: Jan 08, 1932 83 y.o.  Admit date: 08/11/2017 Discharge date: 08/12/2017  Admission Diagnoses:  Discharge Diagnoses:  Active Problems:   Ureteral calculus   Discharged Condition: good  Hospital Course: the patient was admitted with bilateral ureteral stones and underwent bilateral ureteral stent placement.  The following day she was discharged in stable condition.  Consults: None  Significant Diagnostic Studies: none  Treatments: surgery: bilateral ureteral stents  Discharge Exam: Blood pressure (!) 158/61, pulse 89, temperature 97.9 F (36.6 C), temperature source Oral, resp. rate 20, height 5\' 4"  (1.626 m), weight 85 kg (187 lb 6 oz), SpO2 95 %. General appearance: alert, no acute distress Nonlabored respirations, symmetrical chest rise Adequate peripheral perfusion of extremities Abdomen soft, nontender, nondistended Muscle skeletal moves all extremities  Disposition: 01-Home or Self Care We'll schedule her for outpatient bilateral ureteroscopy with laser lithotripsy and ureteral stent placement  Allergies as of 08/12/2017      Reactions   Clonidine Derivatives Other (See Comments)   Dizzy   Glimepiride Other (See Comments)   Hypoglycemia   Invokana [canagliflozin] Other (See Comments)   Weakness, perineal irritation      Medication List    TAKE these medications   ALPRAZolam 0.5 MG tablet Commonly known as:  XANAX Take 0.5 mg by mouth at bedtime.   amLODipine 10 MG tablet Commonly known as:  NORVASC Take 10 mg by mouth daily.   aspirin EC 81 MG tablet Take 81 mg by mouth daily.   ASTEPRO 0.15 % Soln Generic drug:  Azelastine HCl Place 2 sprays into the nose at bedtime.   ciprofloxacin 500 MG tablet Commonly known as:  CIPRO Take 1 tablet (500 mg total) by mouth every 12 (twelve) hours.   doxazosin 2 MG tablet Commonly known as:  CARDURA Take 1 tablet (2 mg total) by  mouth daily. What changed:    how much to take  additional instructions   esomeprazole 40 MG capsule Commonly known as:  NEXIUM Take 40 mg by mouth daily as needed (ACID REFLUX).   furosemide 40 MG tablet Commonly known as:  LASIX Take 20 mg by mouth daily.   irbesartan 150 MG tablet Commonly known as:  AVAPRO Take 150 mg by mouth 2 (two) times daily.   levETIRAcetam 500 MG tablet Commonly known as:  KEPPRA Take 1 tablet (500 mg total) by mouth 2 (two) times daily. What changed:  when to take this   levothyroxine 100 MCG tablet Commonly known as:  SYNTHROID, LEVOTHROID Take 100 mcg by mouth every morning.   metFORMIN 1000 MG tablet Commonly known as:  GLUCOPHAGE Take 1,000 mg by mouth 2 (two) times daily with a meal.   morphine 15 MG tablet Commonly known as:  MSIR Take 1 tablet (15 mg total) by mouth every 4 (four) hours as needed for severe pain.   NORCO 5-325 MG tablet Generic drug:  HYDROcodone-acetaminophen Take 0.5-1 tablets by mouth See admin instructions. Take 1 tablet every night at bedtime, can take one during the day as needed for pain   ondansetron 4 MG disintegrating tablet Commonly known as:  ZOFRAN ODT 4mg  ODT q4 hours prn nausea/vomit   oxybutynin 5 MG tablet Commonly known as:  DITROPAN Take 1 tablet (5 mg total) by mouth every 8 (eight) hours as needed for bladder spasms.   oxyCODONE 5 MG immediate release tablet Commonly known as:  Oxy IR/ROXICODONE Take 1 tablet (5 mg total) by  mouth every 4 (four) hours as needed for moderate pain.   rosuvastatin 20 MG tablet Commonly known as:  CRESTOR Take 20 mg by mouth daily.   sitaGLIPtin 100 MG tablet Commonly known as:  JANUVIA Take 100 mg by mouth daily.   tamsulosin 0.4 MG Caps capsule Commonly known as:  FLOMAX Take 1 capsule (0.4 mg total) by mouth daily after supper.   VISION VITAMINS PO Take 1 tablet by mouth daily.        Signed: Marton Redwood, III 08/12/2017, 1:05 PM

## 2017-08-12 NOTE — Progress Notes (Signed)
Patient is a&ox4, ambulatory without assist.Discharge instructions reviewed, questions concerns denied. Hard prescriptions given.

## 2017-08-13 NOTE — Anesthesia Postprocedure Evaluation (Signed)
Anesthesia Post Note  Patient: Hailey Cobb  Procedure(s) Performed: CYSTOSCOPY/BILATERAL RETROGRADE AND BILATERAL STENT PLACEMENT (Bilateral Ureter)     Patient location during evaluation: PACU Anesthesia Type: General Level of consciousness: awake and alert Pain management: pain level controlled Vital Signs Assessment: post-procedure vital signs reviewed and stable Respiratory status: spontaneous breathing, nonlabored ventilation, respiratory function stable and patient connected to nasal cannula oxygen Cardiovascular status: blood pressure returned to baseline and stable Postop Assessment: no apparent nausea or vomiting Anesthetic complications: no    Last Vitals:  Vitals:   08/12/17 0500 08/12/17 0916  BP: (!) 159/92 (!) 158/61  Pulse: 89   Resp: 20   Temp: 36.6 C   SpO2: 95%     Last Pain:  Vitals:   08/12/17 0916  TempSrc:   PainSc: 5                  Kemi Gell

## 2017-08-23 NOTE — Patient Instructions (Addendum)
NIDA MANFREDI  08/23/2017   Your procedure is scheduled on: 08-25-17  Report to [facility] Main  Entrance    Report to admitting at 11:30AM   Call this number if you have problems the morning of surgery [phone]     Remember: Do not eat food After Midnight. YOU MAY HAVE CLEAR LIQUIDS FROM MIDNIGHT UNTIL 7:30AM DAY OF SURGERY. NOTHING BY MOUTH AFTER 7:30AM!     Take these medicines the morning of surgery with A SIP OF WATER: AMLODIPINE, DOXAZOSIN(CARDURA), KEPPRA, LEVOTHYROXINE, OXYCODONE IF NEEDED                                You may not have any metal on your body including hair pins and              piercings  Do not wear jewelry, make-up, lotions, powders or perfumes, deodorant             Do not wear nail polish.  Do not shave  48 hours prior to surgery.           Do not bring valuables to the hospital. [place].  Contacts, dentures or bridgework may not be worn into surgery.      Patients discharged the day of surgery will not be allowed to drive home.  Name and phone number of your driver:  Special Instructions: N/A              Please read over the following fact sheets you were given: _____________________________________________________________________           [facility] - Preparing for Surgery Before surgery, you can play an important role.  Because skin is not sterile, your skin needs to be as free of germs as possible.  You can reduce the number of germs on your skin by washing with CHG (chlorahexidine gluconate) soap before surgery.  CHG is an antiseptic cleaner which kills germs and bonds with the skin to continue killing germs even after washing. Please DO NOT use if you have an allergy to CHG or antibacterial soaps.  If your skin becomes reddened/irritated stop using the CHG and inform your nurse when you arrive at Short Stay. Do not shave (including legs and underarms) for at  least 48 hours prior to the first CHG shower.  You may shave your face/neck. Please follow these instructions carefully:  1.  Shower with CHG Soap the night before surgery and the  morning of Surgery.  2.  If you choose to wash your hair, wash your hair first as usual with your  normal  shampoo.  3.  After you shampoo, rinse your hair and body thoroughly to remove the  shampoo.                           4.  Use CHG as you would any other liquid soap.  You can apply chg directly  to the skin and wash                       Gently with a scrungie or clean washcloth.  5.  Apply the CHG Soap to your body ONLY FROM  THE NECK DOWN.   Do not use on face/ open                           Wound or open sores. Avoid contact with eyes, ears mouth and genitals (private parts).                       Wash face,  Genitals (private parts) with your normal soap.             6.  Wash thoroughly, paying special attention to the area where your surgery  will be performed.  7.  Thoroughly rinse your body with warm water from the neck down.  8.  DO NOT shower/wash with your normal soap after using and rinsing off  the CHG Soap.                9.  Pat yourself dry with a clean towel.            10.  Wear clean pajamas.            11.  Place clean sheets on your bed the night of your first shower and do not  sleep with pets. Day of Surgery : Do not apply any lotions/deodorants the morning of surgery.  Please wear clean clothes to the hospital/surgery center.  FAILURE TO FOLLOW THESE INSTRUCTIONS MAY RESULT IN THE CANCELLATION OF YOUR SURGERY PATIENT SIGNATURE_________________________________  NURSE SIGNATURE__________________________________  ________________________________________________________________________    CLEAR LIQUID DIET   Foods Allowed                                                                     Foods Excluded  Coffee and tea, regular and decaf                             liquids that you  cannot  Plain Jell-O in any flavor                                             see through such as: Fruit ices (not with fruit pulp)                                     milk, soups, orange juice  Iced Popsicles                                    All solid food Carbonated beverages, regular and diet                                    Cranberry, grape and apple juices Sports drinks like Gatorade Lightly seasoned clear broth or consume(fat free) Sugar, honey syrup  Sample Menu Breakfast  Lunch                                     Supper Cranberry juice                    Beef broth                            Chicken broth Jell-O                                     Grape juice                           Apple juice Coffee or tea                        Jell-O                                      Popsicle                                                Coffee or tea                        Coffee or tea  _____________________________________________________________________  How to Manage Your Diabetes Before and After Surgery  Why is it important to control my blood sugar before and after surgery? . Improving blood sugar levels before and after surgery helps healing and can limit problems. . A way of improving blood sugar control is eating a healthy diet by: o  Eating less sugar and carbohydrates o  Increasing activity/exercise o  Talking with your doctor about reaching your blood sugar goals . High blood sugars (greater than 180 mg/dL) can raise your risk of infections and slow your recovery, so you will need to focus on controlling your diabetes during the weeks before surgery. . Make sure that the doctor who takes care of your diabetes knows about your planned surgery including the date and location.  How do I manage my blood sugar before surgery? . Check your blood sugar at least 4 times a day, starting 2 days before surgery, to make sure that the level is not  too high or low. o Check your blood sugar the morning of your surgery when you wake up and every 2 hours until you get to the Short Stay unit. . If your blood sugar is less than 70 mg/dL, you will need to treat for low blood sugar: o Do not take insulin. o Treat a low blood sugar (less than 70 mg/dL) with  cup of clear juice (cranberry or apple), 4 glucose tablets, OR glucose gel. o Recheck blood sugar in 15 minutes after treatment (to make sure it is greater than 70 mg/dL). If your blood sugar is not greater than 70 mg/dL on recheck, call [phone] for further instructions. . Report your blood sugar to the short stay nurse when you get to Short Stay.  . If you  are admitted to the hospital after surgery: o Your blood sugar will be checked by the staff and you will probably be given insulin after surgery (instead of oral diabetes medicines) to make sure you have good blood sugar levels. o The goal for blood sugar control after surgery is 80-180 mg/dL.   WHAT DO I DO ABOUT MY DIABETES MEDICATION?   . THE DAY BEFORE SURGERY, take METFORMIN and JANUVIA as normal.       . THE MORNING OF SURGERY,   Do not take nay diabetes medication     Patient Signature:  Date:   Nurse Signature:  Date:   Reviewed and Endorsed by [place] Patient Education Committee, August 2015    [facility] - Preparing for Surgery Before surgery, you can play an important role.  Because skin is not sterile, your skin needs to be as free of germs as possible.  You can reduce the number of germs on your skin by washing with CHG (chlorahexidine gluconate) soap before surgery.  CHG is an antiseptic cleaner which kills germs and bonds with the skin to continue killing germs even after washing. Please DO NOT use if you have an allergy to CHG or antibacterial soaps.  If your skin becomes reddened/irritated stop using the CHG and inform your nurse when you arrive at Short Stay. Do not shave (including legs and  underarms) for at least 48 hours prior to the first CHG shower.  You may shave your face/neck. Please follow these instructions carefully:  1.  Shower with CHG Soap the night before surgery and the  morning of Surgery.  2.  If you choose to wash your hair, wash your hair first as usual with your  normal  shampoo.  3.  After you shampoo, rinse your hair and body thoroughly to remove the  shampoo.                           4.  Use CHG as you would any other liquid soap.  You can apply chg directly  to the skin and wash                       Gently with a scrungie or clean washcloth.  5.  Apply the CHG Soap to your body ONLY FROM THE NECK DOWN.   Do not use on face/ open                           Wound or open sores. Avoid contact with eyes, ears mouth and genitals (private parts).                       Wash face,  Genitals (private parts) with your normal soap.             6.  Wash thoroughly, paying special attention to the area where your surgery  will be performed.  7.  Thoroughly rinse your body with warm water from the neck down.  8.  DO NOT shower/wash with your normal soap after using and rinsing off  the CHG Soap.                9.  Pat yourself dry with a clean towel.            10.  Wear clean pajamas.  11.  Place clean sheets on your bed the night of your first shower and do not  sleep with pets. Day of Surgery : Do not apply any lotions/deodorants the morning of surgery.  Please wear clean clothes to the hospital/surgery center.  FAILURE TO FOLLOW THESE INSTRUCTIONS MAY RESULT IN THE CANCELLATION OF YOUR SURGERY PATIENT SIGNATURE_________________________________  NURSE SIGNATURE__________________________________  ________________________________________________________________________

## 2017-08-23 NOTE — Progress Notes (Signed)
[facility]OV CARDIOLOGY DR Daneen Schick 05-03-17 Epic  EKG 02-05-17 Epic  ECHO 01-27-17 Epic  CXR 02-07-17 Epic  MRSA PCR 08-11-17 Epic

## 2017-08-23 NOTE — Progress Notes (Signed)
PRE-OP APPT TOMORROW. PLEASE PLACE ORDERS IN Epic ASAP. THANK YOU !

## 2017-08-24 ENCOUNTER — Encounter (HOSPITAL_COMMUNITY): Payer: Self-pay

## 2017-08-24 ENCOUNTER — Other Ambulatory Visit: Payer: Self-pay

## 2017-08-24 ENCOUNTER — Encounter (HOSPITAL_COMMUNITY)
Admission: RE | Admit: 2017-08-24 | Discharge: 2017-08-24 | Disposition: A | Discharge status: Home / Self Care | Payer: Medicare Other | Source: Ambulatory Visit | Attending: Urology | Admitting: Urology

## 2017-08-24 DIAGNOSIS — Z7982 Long term (current) use of aspirin: Secondary | ICD-10-CM | POA: Diagnosis not present

## 2017-08-24 DIAGNOSIS — G4730 Sleep apnea, unspecified: Secondary | ICD-10-CM | POA: Diagnosis not present

## 2017-08-24 DIAGNOSIS — Z885 Allergy status to narcotic agent status: Secondary | ICD-10-CM | POA: Diagnosis not present

## 2017-08-24 DIAGNOSIS — E119 Type 2 diabetes mellitus without complications: Secondary | ICD-10-CM | POA: Diagnosis not present

## 2017-08-24 DIAGNOSIS — K219 Gastro-esophageal reflux disease without esophagitis: Secondary | ICD-10-CM | POA: Diagnosis not present

## 2017-08-24 DIAGNOSIS — N132 Hydronephrosis with renal and ureteral calculous obstruction: Secondary | ICD-10-CM | POA: Diagnosis not present

## 2017-08-24 DIAGNOSIS — Z6831 Body mass index (BMI) 31.0-31.9, adult: Secondary | ICD-10-CM | POA: Diagnosis not present

## 2017-08-24 DIAGNOSIS — Z888 Allergy status to other drugs, medicaments and biological substances status: Secondary | ICD-10-CM | POA: Diagnosis not present

## 2017-08-24 DIAGNOSIS — G40901 Epilepsy, unspecified, not intractable, with status epilepticus: Secondary | ICD-10-CM | POA: Diagnosis not present

## 2017-08-24 DIAGNOSIS — K754 Autoimmune hepatitis: Secondary | ICD-10-CM | POA: Diagnosis not present

## 2017-08-24 DIAGNOSIS — Z79899 Other long term (current) drug therapy: Secondary | ICD-10-CM | POA: Diagnosis not present

## 2017-08-24 DIAGNOSIS — I10 Essential (primary) hypertension: Secondary | ICD-10-CM | POA: Diagnosis not present

## 2017-08-24 DIAGNOSIS — E7800 Pure hypercholesterolemia, unspecified: Secondary | ICD-10-CM | POA: Diagnosis not present

## 2017-08-24 DIAGNOSIS — M1990 Unspecified osteoarthritis, unspecified site: Secondary | ICD-10-CM | POA: Diagnosis not present

## 2017-08-24 DIAGNOSIS — E039 Hypothyroidism, unspecified: Secondary | ICD-10-CM | POA: Diagnosis not present

## 2017-08-24 HISTORY — DX: Unspecified right bundle-branch block: I45.10

## 2017-08-24 LAB — CBC
HCT: 37.7 % (ref 36.0–46.0)
Hemoglobin: 12.2 g/dL (ref 12.0–15.0)
MCH: 28.7 pg (ref 26.0–34.0)
MCHC: 32.4 g/dL (ref 30.0–36.0)
MCV: 88.7 fL (ref 78.0–100.0)
Platelets: 211 10*3/uL (ref 150–400)
RBC: 4.25 MIL/uL (ref 3.87–5.11)
RDW: 14.1 % (ref 11.5–15.5)
WBC: 6 10*3/uL (ref 4.0–10.5)

## 2017-08-24 LAB — COMPREHENSIVE METABOLIC PANEL
ALT: 14 U/L (ref 14–54)
AST: 21 U/L (ref 15–41)
Albumin: 3.8 g/dL (ref 3.5–5.0)
Alkaline Phosphatase: 77 U/L (ref 38–126)
Anion gap: 6 (ref 5–15)
BUN: 13 mg/dL (ref 6–20)
CO2: 27 mmol/L (ref 22–32)
Calcium: 9.9 mg/dL (ref 8.9–10.3)
Chloride: 101 mmol/L (ref 101–111)
Creatinine, Ser: 0.65 mg/dL (ref 0.44–1.00)
GFR calc Af Amer: >60 mL/min (ref >60)
GFR calc non Af Amer: >60 mL/min (ref >60)
Glucose, Bld: 141 mg/dL — ABNORMAL HIGH (ref 65–99)
Potassium: 4.2 mmol/L (ref 3.5–5.1)
Sodium: 134 mmol/L — ABNORMAL LOW (ref 135–145)
Total Bilirubin: 0.5 mg/dL (ref 0.3–1.2)
Total Protein: 7.5 g/dL (ref 6.5–8.1)

## 2017-08-24 LAB — GLUCOSE, CAPILLARY: Glucose-Capillary: 164 mg/dL — ABNORMAL HIGH (ref 65–99)

## 2017-08-24 LAB — SURGICAL PCR SCREEN
MRSA, PCR: NEGATIVE
Staphylococcus aureus: NEGATIVE

## 2017-08-24 LAB — HEMOGLOBIN A1C
Hgb A1c MFr Bld: 7.1 % — ABNORMAL HIGH (ref 4.8–5.6)
Mean Plasma Glucose: 157.07 mg/dL

## 2017-08-25 ENCOUNTER — Ambulatory Visit (HOSPITAL_COMMUNITY)
Admission: RE | Admit: 2017-08-25 | Discharge: 2017-08-25 | Disposition: A | Discharge status: Home / Self Care | Payer: Medicare Other | Source: Ambulatory Visit | Attending: Urology | Admitting: Urology

## 2017-08-25 ENCOUNTER — Ambulatory Visit (HOSPITAL_COMMUNITY): Payer: Medicare Other | Admitting: Anesthesiology

## 2017-08-25 ENCOUNTER — Encounter (HOSPITAL_COMMUNITY): Payer: Self-pay | Admitting: *Deleted

## 2017-08-25 ENCOUNTER — Ambulatory Visit (HOSPITAL_COMMUNITY): Payer: Medicare Other

## 2017-08-25 ENCOUNTER — Encounter (HOSPITAL_COMMUNITY)
Admission: RE | Disposition: A | Discharge status: Home / Self Care | Payer: Self-pay | Source: Ambulatory Visit | Attending: Urology

## 2017-08-25 DIAGNOSIS — E119 Type 2 diabetes mellitus without complications: Secondary | ICD-10-CM | POA: Insufficient documentation

## 2017-08-25 DIAGNOSIS — Z79899 Other long term (current) drug therapy: Secondary | ICD-10-CM | POA: Insufficient documentation

## 2017-08-25 DIAGNOSIS — N132 Hydronephrosis with renal and ureteral calculous obstruction: Secondary | ICD-10-CM | POA: Insufficient documentation

## 2017-08-25 DIAGNOSIS — E7800 Pure hypercholesterolemia, unspecified: Secondary | ICD-10-CM | POA: Insufficient documentation

## 2017-08-25 DIAGNOSIS — E039 Hypothyroidism, unspecified: Secondary | ICD-10-CM | POA: Diagnosis not present

## 2017-08-25 DIAGNOSIS — G4730 Sleep apnea, unspecified: Secondary | ICD-10-CM | POA: Diagnosis not present

## 2017-08-25 DIAGNOSIS — M1990 Unspecified osteoarthritis, unspecified site: Secondary | ICD-10-CM | POA: Insufficient documentation

## 2017-08-25 DIAGNOSIS — Z6831 Body mass index (BMI) 31.0-31.9, adult: Secondary | ICD-10-CM | POA: Diagnosis not present

## 2017-08-25 DIAGNOSIS — N201 Calculus of ureter: Secondary | ICD-10-CM | POA: Diagnosis not present

## 2017-08-25 DIAGNOSIS — K219 Gastro-esophageal reflux disease without esophagitis: Secondary | ICD-10-CM | POA: Insufficient documentation

## 2017-08-25 DIAGNOSIS — I10 Essential (primary) hypertension: Secondary | ICD-10-CM | POA: Diagnosis not present

## 2017-08-25 DIAGNOSIS — N202 Calculus of kidney with calculus of ureter: Secondary | ICD-10-CM | POA: Diagnosis not present

## 2017-08-25 DIAGNOSIS — K754 Autoimmune hepatitis: Secondary | ICD-10-CM | POA: Insufficient documentation

## 2017-08-25 DIAGNOSIS — G40901 Epilepsy, unspecified, not intractable, with status epilepticus: Secondary | ICD-10-CM | POA: Diagnosis not present

## 2017-08-25 DIAGNOSIS — Z885 Allergy status to narcotic agent status: Secondary | ICD-10-CM | POA: Insufficient documentation

## 2017-08-25 DIAGNOSIS — Z7982 Long term (current) use of aspirin: Secondary | ICD-10-CM | POA: Insufficient documentation

## 2017-08-25 DIAGNOSIS — Z888 Allergy status to other drugs, medicaments and biological substances status: Secondary | ICD-10-CM | POA: Insufficient documentation

## 2017-08-25 DIAGNOSIS — F419 Anxiety disorder, unspecified: Secondary | ICD-10-CM | POA: Diagnosis not present

## 2017-08-25 HISTORY — PX: CYSTOSCOPY/URETEROSCOPY/HOLMIUM LASER/STENT PLACEMENT: SHX6546

## 2017-08-25 LAB — GLUCOSE, CAPILLARY
Glucose-Capillary: 167 mg/dL — ABNORMAL HIGH (ref 65–99)
Glucose-Capillary: 138 mg/dL — ABNORMAL HIGH (ref 65–99)

## 2017-08-25 SURGERY — CYSTOSCOPY/URETEROSCOPY/HOLMIUM LASER/STENT PLACEMENT
Anesthesia: General | Site: Ureter | Laterality: Bilateral

## 2017-08-25 MED ORDER — PHENYLEPHRINE 40 MCG/ML (10ML) SYRINGE FOR IV PUSH (FOR BLOOD PRESSURE SUPPORT)
PREFILLED_SYRINGE | INTRAVENOUS | Status: AC
  Filled 2017-08-25: qty 20

## 2017-08-25 MED ORDER — PROPOFOL 10 MG/ML IV BOLUS
INTRAVENOUS | Status: DC | PRN
  Administered 2017-08-25: 130 mg via INTRAVENOUS

## 2017-08-25 MED ORDER — LIDOCAINE 2% (20 MG/ML) 5 ML SYRINGE
INTRAMUSCULAR | Status: DC | PRN
  Administered 2017-08-25: 75 mg via INTRAVENOUS

## 2017-08-25 MED ORDER — LACTATED RINGERS IV SOLN
INTRAVENOUS | Status: DC
  Administered 2017-08-25 (×2): via INTRAVENOUS

## 2017-08-25 MED ORDER — FENTANYL CITRATE (PF) 100 MCG/2ML IJ SOLN
INTRAMUSCULAR | Status: DC | PRN
  Administered 2017-08-25: 50 ug via INTRAVENOUS
  Administered 2017-08-25: 25 ug via INTRAVENOUS
  Administered 2017-08-25 (×2): 50 ug via INTRAVENOUS
  Administered 2017-08-25: 25 ug via INTRAVENOUS

## 2017-08-25 MED ORDER — FENTANYL CITRATE (PF) 100 MCG/2ML IJ SOLN: 25.0000 ug | INTRAMUSCULAR | Status: DC | PRN

## 2017-08-25 MED ORDER — ONDANSETRON HCL 4 MG/2ML IJ SOLN
INTRAMUSCULAR | Status: DC | PRN
  Administered 2017-08-25: 4 mg via INTRAVENOUS

## 2017-08-25 MED ORDER — FENTANYL CITRATE (PF) 100 MCG/2ML IJ SOLN
INTRAMUSCULAR | Status: AC
  Filled 2017-08-25: qty 2

## 2017-08-25 MED ORDER — LIDOCAINE HCL 2 % EX GEL
CUTANEOUS | Status: AC
  Filled 2017-08-25: qty 5

## 2017-08-25 MED ORDER — ONDANSETRON HCL 4 MG/2ML IJ SOLN
INTRAMUSCULAR | Status: AC
  Filled 2017-08-25: qty 2

## 2017-08-25 MED ORDER — EPHEDRINE 5 MG/ML INJ
INTRAVENOUS | Status: AC
  Filled 2017-08-25: qty 10

## 2017-08-25 MED ORDER — CEFAZOLIN SODIUM-DEXTROSE 2-4 GM/100ML-% IV SOLN
2.0000 g | Freq: Once | INTRAVENOUS | Status: AC
  Administered 2017-08-25: 2 g via INTRAVENOUS
  Filled 2017-08-25: qty 100

## 2017-08-25 MED ORDER — DEXAMETHASONE SODIUM PHOSPHATE 10 MG/ML IJ SOLN
INTRAMUSCULAR | Status: AC
  Filled 2017-08-25: qty 1

## 2017-08-25 MED ORDER — HYDROCODONE-ACETAMINOPHEN 5-325 MG PO TABS: 0.5000 | ORAL_TABLET | ORAL | 0 refills | Status: DC

## 2017-08-25 MED ORDER — LIDOCAINE 2% (20 MG/ML) 5 ML SYRINGE
INTRAMUSCULAR | Status: AC
  Filled 2017-08-25: qty 10

## 2017-08-25 MED ORDER — DEXAMETHASONE SODIUM PHOSPHATE 10 MG/ML IJ SOLN
INTRAMUSCULAR | Status: DC | PRN
  Administered 2017-08-25: 8 mg via INTRAVENOUS

## 2017-08-25 SURGICAL SUPPLY — 23 items
BAG URO CATCHER STRL LF (MISCELLANEOUS) ×3 IMPLANT
BASKET LASER NITINOL 1.9FR (BASKET) IMPLANT
BASKET ZERO TIP NITINOL 2.4FR (BASKET) IMPLANT
CATH INTERMIT  6FR 70CM (CATHETERS) IMPLANT
CATH URET 5FR 28IN CONE TIP (BALLOONS)
CATH URET 5FR 70CM CONE TIP (BALLOONS) IMPLANT
CLOTH BEACON ORANGE TIMEOUT ST (SAFETY) ×3 IMPLANT
COVER FOOTSWITCH UNIV (MISCELLANEOUS) ×3 IMPLANT
FIBER LASER FLEXIVA 365 (UROLOGICAL SUPPLIES) IMPLANT
FIBER LASER TRAC TIP (UROLOGICAL SUPPLIES) ×3 IMPLANT
GOWN STRL REUS W/TWL LRG LVL3 (GOWN DISPOSABLE) IMPLANT
GOWN STRL REUS W/TWL XL LVL3 (GOWN DISPOSABLE) ×3 IMPLANT
GUIDEWIRE ANG ZIPWIRE 038X150 (WIRE) IMPLANT
GUIDEWIRE STR DUAL SENSOR (WIRE) ×6 IMPLANT
INFUSOR MANOMETER BAG 3000ML (MISCELLANEOUS) IMPLANT
MANIFOLD NEPTUNE II (INSTRUMENTS) ×3 IMPLANT
PACK CYSTO (CUSTOM PROCEDURE TRAY) ×3 IMPLANT
SHEATH ACCESS URETERAL 24CM (SHEATH) IMPLANT
SHEATH ACCESS URETERAL 54CM (SHEATH) IMPLANT
SHEATH URETERAL 12FRX35CM (MISCELLANEOUS) IMPLANT
STENT URET 6FRX24 CONTOUR (STENTS) ×3 IMPLANT
SYRINGE 10CC LL (SYRINGE) ×3 IMPLANT
TUBE FEEDING 8FR 16IN STR KANG (MISCELLANEOUS) IMPLANT

## 2017-08-25 NOTE — Discharge Instructions (Addendum)
[facility] [phone] Post Ureteroscopy With or Without Stent Instructions  Definitions:  REMOVE YOUR STENT ON Monday MORNING  Ureter: The duct that transports urine from the kidney to the bladder. Stent:   A plastic hollow tube that is placed into the ureter, from the kidney to the                 bladder to prevent the ureter from swelling shut.  GENERAL INSTRUCTIONS:  Despite the fact that no skin incisions were used, the area around the ureter and bladder is raw and irritated. The stent is a foreign body which will further irritate the bladder wall. This irritation is manifested by increased frequency of urination, both day and night, and by an increase in the urge to urinate. In some, the urge to urinate is present almost always. Sometimes the urge is strong enough that you may not be able to stop yourself from urinating. The only real cure is to remove the stent and then give time for the bladder wall to heal which can't be done until the danger of the ureter swelling shut has passed, which varies.  You may see some blood in your urine while the stent is in place and a few days afterwards. Do not be alarmed, even if the urine was clear for a while. Get off your feet and drink lots of fluids until clearing occurs. If you start to pass clots or don't improve, call us.  DIET: You may return to your normal diet immediately. Because of the raw surface of your bladder, alcohol, spicy foods, acid type foods and drinks with caffeine may cause irritation or frequency and should be used in moderation. To keep your urine flowing freely and to avoid constipation, drink plenty of fluids during the day ( 8-10 glasses ). Tip: Avoid cranberry juice because it is very acidic.  ACTIVITY: Your physical activity doesn't need to be restricted. However, if you are very active, you may see some blood in your urine. We suggest that you reduce your activity under these circumstances until the  bleeding has stopped.  BOWELS: It is important to keep your bowels regular during the postoperative period. Straining with bowel movements can cause bleeding. A bowel movement every other day is reasonable. Use a mild laxative if needed, such as Milk of Magnesia 2-3 tablespoons, or 2 Dulcolax tablets. Call if you continue to have problems. If you have been taking narcotics for pain, before, during or after your surgery, you may be constipated. Take a laxative if necessary.   MEDICATION: You should resume your pre-surgery medications unless told not to. You may take oxybutynin or flomax if prescribed for bladder spasms or discomfort from the stent Take pain medication as directed for pain refractory to conservative management  PROBLEMS YOU SHOULD REPORT TO [place]:  Fevers over 100.5 Fahrenheit.  Heavy bleeding, or clots ( See above notes about blood in urine ).  Inability to urinate.  Drug reactions ( hives, rash, nausea, vomiting, diarrhea ).  Severe burning or pain with urination that is not improving.     General Anesthesia, Adult, Care After These instructions provide you with information about caring for yourself after your procedure. Your health care provider may also give you more specific instructions. Your treatment has been planned according to current medical practices, but problems sometimes occur. Call your health care provider if you have any problems or questions after your procedure. What can I expect after the procedure? After the procedure,  it is common to have:  Vomiting.  A sore throat.  Mental slowness.  It is common to feel:  Nauseous.  Cold or shivery.  Sleepy.  Tired.  Sore or achy, even in parts of your body where you did not have surgery.  Follow these instructions at home: For at least 24 hours after the procedure:  Do not: ? Participate in activities where you could fall or become injured. ? Drive. ? Use heavy machinery. ? Drink  alcohol. ? Take sleeping pills or medicines that cause drowsiness. ? Make important decisions or sign legal documents. ? Take care of children on your own.  Rest. Eating and drinking  If you vomit, drink water, juice, or soup when you can drink without vomiting.  Drink enough fluid to keep your urine clear or pale yellow.  Make sure you have little or no nausea before eating solid foods.  Follow the diet recommended by your health care provider. General instructions  Have a responsible adult stay with you until you are awake and alert.  Return to your normal activities as told by your health care provider. Ask your health care provider what activities are safe for you.  Take over-the-counter and prescription medicines only as told by your health care provider.  If you smoke, do not smoke without supervision.  Keep all follow-up visits as told by your health care provider. This is important. Contact a health care provider if:  You continue to have nausea or vomiting at home, and medicines are not helpful.  You cannot drink fluids or start eating again.  You cannot urinate after 8-12 hours.  You develop a skin rash.  You have fever.  You have increasing redness at the site of your procedure. Get help right away if:  You have difficulty breathing.  You have chest pain.  You have unexpected bleeding.  You feel that you are having a life-threatening or urgent problem. This information is not intended to replace advice given to you by your health care provider. Make sure you discuss any questions you have with your health care provider. Document Released: 11/09/2000 Document Revised: 01/06/2016 Document Reviewed: 07/18/2015 Elsevier Interactive Patient Education  [employer].

## 2017-08-25 NOTE — Op Note (Signed)
Operative Note  Preoperative diagnosis:  1.  Bilateral ureteral calculi  Postoperative diagnosis: 1.  Left renal calculus  Procedure(s): 1.  Cystoscopy with right diagnostic ureteroscopy and ureteral stent removal, left ureteroscopy with laser lithotripsy and ureteral stent exchange, fluoroscopy less than 1 hour  Surgeon: Link Snuffer, MD  Assistants: None  Anesthesia: General  Complications: None immediate  EBL: Minimal  Specimens: 1.  None  Drains/Catheters: 1.  6 x 24 double-J ureteral stent on the left  Intraoperative findings: Right diagnostic ureteroscopy revealed no stones along the ureter or in the kidney.  Left ureteroscopy revealed a 1 cm renal calculus there was fragmented to less than 1 mm fragments.  There was a very soft stone.  Bladder and urethra were normal.  Indication: 84 year old female who recently underwent a bilateral ureteral stent placement for a right distal ureteral calculus and a left ureteropelvic junction calculus presents for the after mentioned operation.  Description of procedure:  The patient was identified and consent was obtained.  The patient was taken to the operating room and placed in the supine position.  The patient was placed under general anesthesia.  Perioperative antibiotics were administered.  The patient was placed in dorsal lithotomy.  Patient was prepped and draped in a standard sterile fashion and a timeout was performed.  A 21 French rigid cystoscope was advanced into the urethra and into the bladder.  Cystoscopy revealed no abnormal findings.  The right stent was removed just beyond the urethral meatus and a sensor wire was advanced up into the kidney under fluoroscopic guidance.  The remainder of the stent was removed.  Semirigid ureteroscopy was performed alongside the wire which revealed no ureteral calculi.  A flexible ureteroscope was advanced over the wire up to the kidney under continuous fluoroscopic guidance.  The wire  was withdrawn.  Complete pyeloscopy was performed which revealed no stones in the kidney.  The ureter was wide open.  I removed the flexible ureteroscope and there was no injury to the ureter and there were no ureteral calculi seen.  The ureter was wide open.  I therefore decided not to leave a stent on the right side.  I reinserted the rigid cystoscope and grasped the left-sided stent and pulled it just beyond the urethral meatus.  A sensor wire was advanced up into the kidney.  The remainder of the stent was removed and semirigid ureteroscopy revealed no stones along the course of the left ureter.  I removed the semirigid ureteroscope and advanced the flexible ureteroscope over the wire under continuous fluoroscopic guidance up into the kidney and remove the wire.  Identified the 1 cm stone of interest and used the laser fiber to fragment this to less than 1 mm fragments.  Complete pyeloscopy revealed no other clinically significant stone fragments.  I replaced the sensor wire and withdrew the scope and visualize the entire ureter upon removal.  There were no ureteral calculi or ureteral injury seen.  Under fluoroscopic guidance I advanced a 6 x 24 double-J ureteral stent over the wire and remove the wire.  Fluoroscopy confirmed proximal placement and I inserted the cystoscope and visualized a good coil within the bladder.  I left the string in place.  I drained the bladder and withdrew the scope and this concluded the operation.  Patient tolerated procedure well and stable postoperatively.  Plan: The patient will remove her stent on Monday morning.  She will return to see me in 4-6 weeks for a renal ultrasound and KUB.

## 2017-08-25 NOTE — Anesthesia Procedure Notes (Signed)
Procedure Name: LMA Insertion Date/Time: 08/25/2017 3:57 PM Performed by: Lavina Hamman, CRNA Pre-anesthesia Checklist: Patient identified, Emergency Drugs available, Suction available and Patient being monitored Patient Re-evaluated:Patient Re-evaluated prior to induction Oxygen Delivery Method: Circle System Utilized Preoxygenation: Pre-oxygenation with 100% oxygen Induction Type: IV induction Ventilation: Mask ventilation without difficulty LMA: LMA inserted LMA Size: 4.0 Number of attempts: 1 Airway Equipment and Method: Bite block Placement Confirmation: positive ETCO2 Tube secured with: Tape Dental Injury: Teeth and Oropharynx as per pre-operative assessment

## 2017-08-25 NOTE — Anesthesia Preprocedure Evaluation (Signed)
Anesthesia Evaluation  Patient identified by MRN, date of birth, ID band Patient awake    Reviewed: Allergy & Precautions, NPO status , Patient's Chart, lab work & pertinent test results  History of Anesthesia Complications Negative for: history of anesthetic complications  Airway Mallampati: II  TM Distance: >3 FB Neck ROM: Full    Dental  (+) Chipped, Missing, Poor Dentition,    Pulmonary shortness of breath, neg sleep apnea, neg COPD, neg recent URI,    breath sounds clear to auscultation       Cardiovascular hypertension, Pt. on medications (-) angina(-) Past MI and (-) CHF + dysrhythmias + Valvular Problems/Murmurs AS  Rhythm:Regular Rate:Normal  Moderate AS on last echo   Neuro/Psych Seizures -, Well Controlled,  PSYCHIATRIC DISORDERS Anxiety    GI/Hepatic GERD  Medicated and Controlled,(+) Hepatitis -, Autoimmune  Endo/Other  diabetes, Type 2Hypothyroidism Morbid obesity  Renal/GU      Musculoskeletal  (+) Arthritis ,   Abdominal   Peds  Hematology negative hematology ROS (+)   Anesthesia Other Findings   Reproductive/Obstetrics                             Lab Results  Component Value Date   WBC 6.0 08/24/2017   HGB 12.2 08/24/2017   HCT 37.7 08/24/2017   MCV 88.7 08/24/2017   PLT 211 08/24/2017   Lab Results  Component Value Date   CREATININE 0.65 08/24/2017   BUN 13 08/24/2017   NA 134 (L) 08/24/2017   K 4.2 08/24/2017   CL 101 08/24/2017   CO2 27 08/24/2017    Anesthesia Physical  Anesthesia Plan  ASA: III  Anesthesia Plan: General   Post-op Pain Management:    Induction: Intravenous  PONV Risk Score and Plan: 3 and Ondansetron, Dexamethasone and Treatment may vary due to age or medical condition  Airway Management Planned: LMA  Additional Equipment: None  Intra-op Plan:   Post-operative Plan: Extubation in OR  Informed Consent: I have reviewed the  patients History and Physical, chart, labs and discussed the procedure including the risks, benefits and alternatives for the proposed anesthesia with the patient or authorized representative who has indicated his/her understanding and acceptance.   Dental advisory given  Plan Discussed with: CRNA and Surgeon  Anesthesia Plan Comments:         Anesthesia Quick Evaluation

## 2017-08-25 NOTE — Transfer of Care (Signed)
Immediate Anesthesia Transfer of Care Note  Patient: Hailey Cobb  Procedure(s) Performed: CYSTOSCOPY/URETEROSCOPY/HOLMIUM LASER/STENT PLACEMENT, diagnosic right urteral stent removal (Bilateral Ureter)  Patient Location: PACU  Anesthesia Type:General  Level of Consciousness: awake, alert  and oriented  Airway & Oxygen Therapy: Patient Spontanous Breathing and Patient connected to face mask oxygen  Post-op Assessment: Report given to RN  Post vital signs: Reviewed and stable  Last Vitals:  Vitals:   08/25/17 1150 08/25/17 1655  BP: (!) 148/81   Pulse: 87 78  Resp: 16 15  Temp: 36.6 C 37.2 C  SpO2: 98% 100%    Last Pain:  Vitals:   08/25/17 1211  TempSrc:   PainSc: 3          Complications: No apparent anesthesia complications

## 2017-08-25 NOTE — Interval H&P Note (Signed)
History and Physical Interval Note:  08/25/2017 3:17 PM  Hailey Cobb  has presented today for surgery, with the diagnosis of BILATERAL RENAL CALCULI  The various methods of treatment have been discussed with the patient and family. After consideration of risks, benefits and other options for treatment, the patient has consented to  Procedure(s): CYSTOSCOPY/URETEROSCOPY/HOLMIUM LASER/STENT PLACEMENT (Bilateral) as a surgical intervention .  The patient's history has been reviewed, patient examined, no change in status, stable for surgery.  I have reviewed the patient's chart and labs.  Questions were answered to the patient's satisfaction.     Marton Redwood, III

## 2017-08-25 NOTE — Anesthesia Postprocedure Evaluation (Signed)
Anesthesia Post Note  Patient: Hailey Cobb  Procedure(s) Performed: CYSTOSCOPY/URETEROSCOPY/HOLMIUM LASER/STENT PLACEMENT, diagnosic right urteral stent removal (Bilateral Ureter)     Patient location during evaluation: PACU Anesthesia Type: General Level of consciousness: awake and alert Pain management: pain level controlled Vital Signs Assessment: post-procedure vital signs reviewed and stable Respiratory status: spontaneous breathing, nonlabored ventilation, respiratory function stable and patient connected to nasal cannula oxygen Cardiovascular status: blood pressure returned to baseline and stable Postop Assessment: no apparent nausea or vomiting Anesthetic complications: no    Last Vitals:  Vitals:   08/25/17 1723 08/25/17 1738  BP: (!) 154/65 (!) 154/66  Pulse: 74 66  Resp: 17   Temp: 36.6 C 36.7 C  SpO2: 93%     Last Pain:  Vitals:   08/25/17 1738  TempSrc: Oral  PainSc: 1                  Barnet Glasgow

## 2017-08-26 ENCOUNTER — Encounter (HOSPITAL_COMMUNITY): Payer: Self-pay | Admitting: Urology

## 2017-09-23 DIAGNOSIS — E11319 Type 2 diabetes mellitus with unspecified diabetic retinopathy without macular edema: Secondary | ICD-10-CM | POA: Diagnosis not present

## 2017-09-23 DIAGNOSIS — Z7984 Long term (current) use of oral hypoglycemic drugs: Secondary | ICD-10-CM | POA: Diagnosis not present

## 2017-09-23 DIAGNOSIS — E782 Mixed hyperlipidemia: Secondary | ICD-10-CM | POA: Diagnosis not present

## 2017-09-23 DIAGNOSIS — I10 Essential (primary) hypertension: Secondary | ICD-10-CM | POA: Diagnosis not present

## 2017-09-24 DIAGNOSIS — E11319 Type 2 diabetes mellitus with unspecified diabetic retinopathy without macular edema: Secondary | ICD-10-CM | POA: Diagnosis not present

## 2017-09-24 DIAGNOSIS — F419 Anxiety disorder, unspecified: Secondary | ICD-10-CM | POA: Diagnosis not present

## 2017-09-24 DIAGNOSIS — Z7984 Long term (current) use of oral hypoglycemic drugs: Secondary | ICD-10-CM | POA: Diagnosis not present

## 2017-09-24 DIAGNOSIS — K754 Autoimmune hepatitis: Secondary | ICD-10-CM | POA: Diagnosis not present

## 2017-09-24 DIAGNOSIS — Z87898 Personal history of other specified conditions: Secondary | ICD-10-CM | POA: Diagnosis not present

## 2017-09-24 DIAGNOSIS — E039 Hypothyroidism, unspecified: Secondary | ICD-10-CM | POA: Diagnosis not present

## 2017-09-24 DIAGNOSIS — Q253 Supravalvular aortic stenosis: Secondary | ICD-10-CM | POA: Diagnosis not present

## 2017-09-24 DIAGNOSIS — M47816 Spondylosis without myelopathy or radiculopathy, lumbar region: Secondary | ICD-10-CM | POA: Diagnosis not present

## 2017-09-24 DIAGNOSIS — E669 Obesity, unspecified: Secondary | ICD-10-CM | POA: Diagnosis not present

## 2017-09-24 DIAGNOSIS — Z6832 Body mass index (BMI) 32.0-32.9, adult: Secondary | ICD-10-CM | POA: Diagnosis not present

## 2017-09-24 DIAGNOSIS — L931 Subacute cutaneous lupus erythematosus: Secondary | ICD-10-CM | POA: Diagnosis not present

## 2017-09-24 DIAGNOSIS — I10 Essential (primary) hypertension: Secondary | ICD-10-CM | POA: Diagnosis not present

## 2017-10-14 DIAGNOSIS — G894 Chronic pain syndrome: Secondary | ICD-10-CM | POA: Diagnosis not present

## 2017-10-14 DIAGNOSIS — Z79899 Other long term (current) drug therapy: Secondary | ICD-10-CM | POA: Diagnosis not present

## 2017-10-14 DIAGNOSIS — F419 Anxiety disorder, unspecified: Secondary | ICD-10-CM | POA: Diagnosis not present

## 2017-10-14 DIAGNOSIS — Z6832 Body mass index (BMI) 32.0-32.9, adult: Secondary | ICD-10-CM | POA: Diagnosis not present

## 2017-10-14 DIAGNOSIS — M1990 Unspecified osteoarthritis, unspecified site: Secondary | ICD-10-CM | POA: Diagnosis not present

## 2017-10-14 DIAGNOSIS — E669 Obesity, unspecified: Secondary | ICD-10-CM | POA: Diagnosis not present

## 2017-10-20 DIAGNOSIS — N200 Calculus of kidney: Secondary | ICD-10-CM | POA: Diagnosis not present

## 2017-10-29 DIAGNOSIS — M5136 Other intervertebral disc degeneration, lumbar region: Secondary | ICD-10-CM | POA: Diagnosis not present

## 2017-10-29 DIAGNOSIS — M50322 Other cervical disc degeneration at C5-C6 level: Secondary | ICD-10-CM | POA: Diagnosis not present

## 2017-10-29 DIAGNOSIS — M9903 Segmental and somatic dysfunction of lumbar region: Secondary | ICD-10-CM | POA: Diagnosis not present

## 2017-10-29 DIAGNOSIS — M9901 Segmental and somatic dysfunction of cervical region: Secondary | ICD-10-CM | POA: Diagnosis not present

## 2017-11-17 DIAGNOSIS — M5136 Other intervertebral disc degeneration, lumbar region: Secondary | ICD-10-CM | POA: Diagnosis not present

## 2017-11-17 DIAGNOSIS — M9903 Segmental and somatic dysfunction of lumbar region: Secondary | ICD-10-CM | POA: Diagnosis not present

## 2017-11-17 DIAGNOSIS — M50322 Other cervical disc degeneration at C5-C6 level: Secondary | ICD-10-CM | POA: Diagnosis not present

## 2017-11-17 DIAGNOSIS — M9901 Segmental and somatic dysfunction of cervical region: Secondary | ICD-10-CM | POA: Diagnosis not present

## 2017-12-08 DIAGNOSIS — M9903 Segmental and somatic dysfunction of lumbar region: Secondary | ICD-10-CM | POA: Diagnosis not present

## 2017-12-08 DIAGNOSIS — M50322 Other cervical disc degeneration at C5-C6 level: Secondary | ICD-10-CM | POA: Diagnosis not present

## 2017-12-08 DIAGNOSIS — M5136 Other intervertebral disc degeneration, lumbar region: Secondary | ICD-10-CM | POA: Diagnosis not present

## 2017-12-08 DIAGNOSIS — M9901 Segmental and somatic dysfunction of cervical region: Secondary | ICD-10-CM | POA: Diagnosis not present

## 2017-12-21 DIAGNOSIS — M5136 Other intervertebral disc degeneration, lumbar region: Secondary | ICD-10-CM | POA: Diagnosis not present

## 2017-12-21 DIAGNOSIS — M9901 Segmental and somatic dysfunction of cervical region: Secondary | ICD-10-CM | POA: Diagnosis not present

## 2017-12-21 DIAGNOSIS — M50322 Other cervical disc degeneration at C5-C6 level: Secondary | ICD-10-CM | POA: Diagnosis not present

## 2017-12-21 DIAGNOSIS — M9903 Segmental and somatic dysfunction of lumbar region: Secondary | ICD-10-CM | POA: Diagnosis not present

## 2017-12-23 DIAGNOSIS — M5136 Other intervertebral disc degeneration, lumbar region: Secondary | ICD-10-CM | POA: Diagnosis not present

## 2017-12-23 DIAGNOSIS — M9901 Segmental and somatic dysfunction of cervical region: Secondary | ICD-10-CM | POA: Diagnosis not present

## 2017-12-23 DIAGNOSIS — M50322 Other cervical disc degeneration at C5-C6 level: Secondary | ICD-10-CM | POA: Diagnosis not present

## 2017-12-23 DIAGNOSIS — M9903 Segmental and somatic dysfunction of lumbar region: Secondary | ICD-10-CM | POA: Diagnosis not present

## 2017-12-28 DIAGNOSIS — M50322 Other cervical disc degeneration at C5-C6 level: Secondary | ICD-10-CM | POA: Diagnosis not present

## 2017-12-28 DIAGNOSIS — M5136 Other intervertebral disc degeneration, lumbar region: Secondary | ICD-10-CM | POA: Diagnosis not present

## 2017-12-28 DIAGNOSIS — M9903 Segmental and somatic dysfunction of lumbar region: Secondary | ICD-10-CM | POA: Diagnosis not present

## 2017-12-28 DIAGNOSIS — M9901 Segmental and somatic dysfunction of cervical region: Secondary | ICD-10-CM | POA: Diagnosis not present

## 2017-12-31 DIAGNOSIS — M9902 Segmental and somatic dysfunction of thoracic region: Secondary | ICD-10-CM | POA: Diagnosis not present

## 2017-12-31 DIAGNOSIS — M9903 Segmental and somatic dysfunction of lumbar region: Secondary | ICD-10-CM | POA: Diagnosis not present

## 2017-12-31 DIAGNOSIS — M5136 Other intervertebral disc degeneration, lumbar region: Secondary | ICD-10-CM | POA: Diagnosis not present

## 2017-12-31 DIAGNOSIS — M5134 Other intervertebral disc degeneration, thoracic region: Secondary | ICD-10-CM | POA: Diagnosis not present

## 2018-01-06 DIAGNOSIS — M5136 Other intervertebral disc degeneration, lumbar region: Secondary | ICD-10-CM | POA: Diagnosis not present

## 2018-01-06 DIAGNOSIS — M9902 Segmental and somatic dysfunction of thoracic region: Secondary | ICD-10-CM | POA: Diagnosis not present

## 2018-01-06 DIAGNOSIS — M9903 Segmental and somatic dysfunction of lumbar region: Secondary | ICD-10-CM | POA: Diagnosis not present

## 2018-01-06 DIAGNOSIS — M5134 Other intervertebral disc degeneration, thoracic region: Secondary | ICD-10-CM | POA: Diagnosis not present

## 2018-03-08 DIAGNOSIS — H35371 Puckering of macula, right eye: Secondary | ICD-10-CM | POA: Diagnosis not present

## 2018-03-08 DIAGNOSIS — E113293 Type 2 diabetes mellitus with mild nonproliferative diabetic retinopathy without macular edema, bilateral: Secondary | ICD-10-CM | POA: Diagnosis not present

## 2018-03-08 DIAGNOSIS — H15833 Staphyloma posticum, bilateral: Secondary | ICD-10-CM | POA: Diagnosis not present

## 2018-03-08 DIAGNOSIS — H43813 Vitreous degeneration, bilateral: Secondary | ICD-10-CM | POA: Diagnosis not present

## 2018-03-16 ENCOUNTER — Telehealth: Payer: Self-pay | Admitting: Interventional Cardiology

## 2018-03-16 DIAGNOSIS — R001 Bradycardia, unspecified: Secondary | ICD-10-CM

## 2018-03-16 NOTE — Telephone Encounter (Signed)
Pt's daughter calling and stated  Pt's MD changed BP medication due topt's pulse was 90. Need call back to discuss.

## 2018-03-16 NOTE — Telephone Encounter (Signed)
Spoke with daughter, DPR on file.  Pt c/o fatigue and weakness recently so reached out to PCP, Dr. Rex Kras.  Pt seen PCP today and HR was 90.  Amlodipine was dc'ed and pt was started on Diltiazem 180mg  QD to help with HR.  Daughter says pt has not complained of SOB, CP, sweating, dizziness or lightheadedness.  Daughter wanted to know if Dr. Tamala Julian felt this med change was appropriate.  Advised I would send to Dr. Tamala Julian for review.

## 2018-03-17 NOTE — Telephone Encounter (Signed)
Left message to call back  

## 2018-03-17 NOTE — Telephone Encounter (Signed)
Spoke with daughter and advised her of recommendations.  Daughter verbalized understanding and was in agreement with this plan.

## 2018-03-17 NOTE — Telephone Encounter (Signed)
Pt's daughter returning call to nurse. Please call daughter

## 2018-03-17 NOTE — Telephone Encounter (Signed)
Previous medication was stopped because heart rate was too slow.  Diltiazem has potential to do the same thing.  Will need to monitor heart rate closely.  She probably needs to have a repeat 48-hour Holter in approximately 1 week.

## 2018-03-24 ENCOUNTER — Ambulatory Visit (INDEPENDENT_AMBULATORY_CARE_PROVIDER_SITE_OTHER): Payer: Medicare Other

## 2018-03-24 DIAGNOSIS — R001 Bradycardia, unspecified: Secondary | ICD-10-CM | POA: Diagnosis not present

## 2018-03-28 ENCOUNTER — Telehealth: Payer: Self-pay | Admitting: Interventional Cardiology

## 2018-03-28 DIAGNOSIS — M50321 Other cervical disc degeneration at C4-C5 level: Secondary | ICD-10-CM | POA: Diagnosis not present

## 2018-03-28 DIAGNOSIS — M5136 Other intervertebral disc degeneration, lumbar region: Secondary | ICD-10-CM | POA: Diagnosis not present

## 2018-03-28 DIAGNOSIS — M9903 Segmental and somatic dysfunction of lumbar region: Secondary | ICD-10-CM | POA: Diagnosis not present

## 2018-03-28 DIAGNOSIS — M9901 Segmental and somatic dysfunction of cervical region: Secondary | ICD-10-CM | POA: Diagnosis not present

## 2018-03-28 NOTE — Telephone Encounter (Signed)
New problem   Pt's daughter is calling and want you to call the pt to discuss her medication she is currently taking, because she isn't feeling well and she think her BP is low. Please call pt.

## 2018-03-28 NOTE — Telephone Encounter (Signed)
Pt's PCP switched her from Amlodipine to Diltiazem 180mg  QD at the end of July.  Pt states since starting Dilt she has felt bad- wobbly legs, fatigue, dizziness and HA.  Pt states she feels the worst when her HR is in the 90's.  Recent vitals are as follows: 111/75, 87- yesterday 1pm 134/87, 92- yesterday 2pm 107/66, 68- yesterday at night. 95/65, 94 108/68, 97 110/60, 80  Pt wore 48 hr holter monitor this weekend and is turning it back in today.  Advised pt I would send message to Dr. Tamala Julian for review.  Advised he may wait for monitor results before giving recommendations.  Advised I will call as soon as I hear back from Dr. Tamala Julian.

## 2018-03-29 NOTE — Telephone Encounter (Signed)
Will wait to review monitor. We must verify the patient is not taking both amlodipine and diltiazem.  Diltiazem is not noted on the medication list. Current recorded blood pressures are okay.

## 2018-03-30 NOTE — Telephone Encounter (Signed)
Pt is not taking Amlodipine.

## 2018-03-31 NOTE — Telephone Encounter (Signed)
Hailey Crome, MD  Loren Racer, LPN        Please schedule the patient for repeat echocardiogram to follow-up on aortic stenosis. Last study was 1 year ago. Hopefully she has an upcoming office visit with me to discuss results of all studies including the Holter which did not show any significant arrhythmia other than PVCs. No A. fib was noted   Spoke with pt and went over results and recommendations per Dr. Tamala Julian.  Pt is scheduled for echo 9/9 and appt with Dr. Tamala Julian 9/19.  Pt verbalized understanding and was in agreement with this plan.

## 2018-04-20 DIAGNOSIS — Z87442 Personal history of urinary calculi: Secondary | ICD-10-CM | POA: Diagnosis not present

## 2018-04-20 DIAGNOSIS — E11319 Type 2 diabetes mellitus with unspecified diabetic retinopathy without macular edema: Secondary | ICD-10-CM | POA: Diagnosis not present

## 2018-04-20 DIAGNOSIS — I350 Nonrheumatic aortic (valve) stenosis: Secondary | ICD-10-CM | POA: Diagnosis not present

## 2018-04-20 DIAGNOSIS — R8290 Unspecified abnormal findings in urine: Secondary | ICD-10-CM | POA: Diagnosis not present

## 2018-04-20 DIAGNOSIS — M47816 Spondylosis without myelopathy or radiculopathy, lumbar region: Secondary | ICD-10-CM | POA: Diagnosis not present

## 2018-04-20 DIAGNOSIS — E890 Postprocedural hypothyroidism: Secondary | ICD-10-CM | POA: Diagnosis not present

## 2018-04-20 DIAGNOSIS — E782 Mixed hyperlipidemia: Secondary | ICD-10-CM | POA: Diagnosis not present

## 2018-04-20 DIAGNOSIS — R197 Diarrhea, unspecified: Secondary | ICD-10-CM | POA: Diagnosis not present

## 2018-04-20 DIAGNOSIS — I10 Essential (primary) hypertension: Secondary | ICD-10-CM | POA: Diagnosis not present

## 2018-04-20 DIAGNOSIS — K754 Autoimmune hepatitis: Secondary | ICD-10-CM | POA: Diagnosis not present

## 2018-04-20 DIAGNOSIS — Z23 Encounter for immunization: Secondary | ICD-10-CM | POA: Diagnosis not present

## 2018-04-20 DIAGNOSIS — Z0000 Encounter for general adult medical examination without abnormal findings: Secondary | ICD-10-CM | POA: Diagnosis not present

## 2018-04-20 DIAGNOSIS — Z87898 Personal history of other specified conditions: Secondary | ICD-10-CM | POA: Diagnosis not present

## 2018-04-20 DIAGNOSIS — Z1331 Encounter for screening for depression: Secondary | ICD-10-CM | POA: Diagnosis not present

## 2018-04-25 ENCOUNTER — Other Ambulatory Visit (HOSPITAL_COMMUNITY): Payer: Medicare Other

## 2018-04-27 DIAGNOSIS — H52223 Regular astigmatism, bilateral: Secondary | ICD-10-CM | POA: Diagnosis not present

## 2018-04-27 DIAGNOSIS — E113293 Type 2 diabetes mellitus with mild nonproliferative diabetic retinopathy without macular edema, bilateral: Secondary | ICD-10-CM | POA: Diagnosis not present

## 2018-04-27 DIAGNOSIS — H5213 Myopia, bilateral: Secondary | ICD-10-CM | POA: Diagnosis not present

## 2018-04-27 DIAGNOSIS — H524 Presbyopia: Secondary | ICD-10-CM | POA: Diagnosis not present

## 2018-04-27 DIAGNOSIS — H1859 Other hereditary corneal dystrophies: Secondary | ICD-10-CM | POA: Diagnosis not present

## 2018-04-27 DIAGNOSIS — H02834 Dermatochalasis of left upper eyelid: Secondary | ICD-10-CM | POA: Diagnosis not present

## 2018-04-29 ENCOUNTER — Ambulatory Visit (HOSPITAL_COMMUNITY): Payer: Medicare Other | Attending: Interventional Cardiology

## 2018-04-29 ENCOUNTER — Other Ambulatory Visit: Payer: Self-pay

## 2018-04-29 DIAGNOSIS — I083 Combined rheumatic disorders of mitral, aortic and tricuspid valves: Secondary | ICD-10-CM | POA: Insufficient documentation

## 2018-04-29 DIAGNOSIS — I350 Nonrheumatic aortic (valve) stenosis: Secondary | ICD-10-CM

## 2018-04-29 DIAGNOSIS — I6200 Nontraumatic subdural hemorrhage, unspecified: Secondary | ICD-10-CM | POA: Insufficient documentation

## 2018-05-03 DIAGNOSIS — K754 Autoimmune hepatitis: Secondary | ICD-10-CM | POA: Diagnosis not present

## 2018-05-03 DIAGNOSIS — Z79899 Other long term (current) drug therapy: Secondary | ICD-10-CM | POA: Diagnosis not present

## 2018-05-03 DIAGNOSIS — Q253 Supravalvular aortic stenosis: Secondary | ICD-10-CM | POA: Diagnosis not present

## 2018-05-03 DIAGNOSIS — G894 Chronic pain syndrome: Secondary | ICD-10-CM | POA: Diagnosis not present

## 2018-05-03 DIAGNOSIS — I350 Nonrheumatic aortic (valve) stenosis: Secondary | ICD-10-CM | POA: Diagnosis not present

## 2018-05-03 DIAGNOSIS — I10 Essential (primary) hypertension: Secondary | ICD-10-CM | POA: Diagnosis not present

## 2018-05-03 DIAGNOSIS — E11319 Type 2 diabetes mellitus with unspecified diabetic retinopathy without macular edema: Secondary | ICD-10-CM | POA: Diagnosis not present

## 2018-05-03 DIAGNOSIS — E890 Postprocedural hypothyroidism: Secondary | ICD-10-CM | POA: Diagnosis not present

## 2018-05-03 DIAGNOSIS — M1990 Unspecified osteoarthritis, unspecified site: Secondary | ICD-10-CM | POA: Diagnosis not present

## 2018-05-03 DIAGNOSIS — L931 Subacute cutaneous lupus erythematosus: Secondary | ICD-10-CM | POA: Diagnosis not present

## 2018-05-03 DIAGNOSIS — M47816 Spondylosis without myelopathy or radiculopathy, lumbar region: Secondary | ICD-10-CM | POA: Diagnosis not present

## 2018-05-03 DIAGNOSIS — E782 Mixed hyperlipidemia: Secondary | ICD-10-CM | POA: Diagnosis not present

## 2018-05-04 NOTE — Progress Notes (Signed)
Cardiology Office Note:    Date:  05/05/2018   ID:  Hailey Cobb, DOB 1932/04/08, MRN [id number]  PCP:  Hulan Fess, MD  Cardiologist:  No primary care provider on file.   Referring MD: Hulan Fess, MD   Chief Complaint  Patient presents with  . Coronary Artery Disease  . Cardiac Valve Problem    History of Present Illness:    Hailey Cobb is a 84 y.o. female with a hx of a hx of moderate aortic stenosis. She denies significant dyspnea, angina, and syncope.  She has pain in both feet and legs.  Minimal if any lower extremity swelling.  Decreased energy overall.  Continues to have occasional palpitations although not as frequent or severe as before.  She denies angina.  She has not had orthopnea or syncope.  She states that she has lower extremity swelling but it is never severe and always late in the afternoon.    Past Medical History:  Diagnosis Date  . Anxiety   . Autoimmune hepatitis ([place])   . Complication of anesthesia    has plastic on her vocal  cords due to difficult thyroidectomy  . Diabetes mellitus without complication (HCC)    Type 2   . Environmental allergies   . GERD (gastroesophageal reflux disease)   . Heart murmur    mild AS by 01/2010 echo [facility]); moderate AS 07/24/14 echo [facility])  . Hepatitis    Autoimmune   . Hypercholesteremia   . Hypertension   . Hypothyroidism   . IBS (irritable bowel syndrome)   . Mucus pooling in larynx    Begin after having thyroidectomy; pt takes Mucus medication nightly  . Osteoarthritis   . RBBB (right bundle branch block)    SEE EKG   . S/P dilatation of esophageal stricture   . Seizures ([place])    subdural hematoma 2015  . Shortness of breath    with exertion  . Subdural hematoma ([place])   . Unilateral vocal cord paralysis   . Vitiligo     Past Surgical History:  Procedure Laterality Date  . ABDOMINAL HYSTERECTOMY    . APPENDECTOMY    . APPLICATION OF A-CELL OF EXTREMITY Left 12/27/2014   Procedure:  PLACEMENT OF A-CELL ;  Surgeon: Theodoro Kos, DO;  Location: [place];  Service: Plastics;  Laterality: Left;  . CATARACT EXTRACTION W/ INTRAOCULAR LENS  IMPLANT, BILATERAL Bilateral   . CYSTOSCOPY/RETROGRADE/URETEROSCOPY Bilateral 08/11/2017   Procedure: CYSTOSCOPY/BILATERAL RETROGRADE AND BILATERAL STENT PLACEMENT;  Surgeon: Lucas Mallow, MD;  Location: [facility];  Service: Urology;  Laterality: Bilateral;  . CYSTOSCOPY/URETEROSCOPY/HOLMIUM LASER/STENT PLACEMENT Bilateral 08/25/2017   Procedure: CYSTOSCOPY/URETEROSCOPY/HOLMIUM LASER/STENT PLACEMENT, diagnosic right urteral stent removal;  Surgeon: Lucas Mallow, MD;  Location: [facility];  Service: Urology;  Laterality: Bilateral;  . ELBOW BURSA SURGERY Left   . EYE SURGERY    . [name] 12/27/2014   Procedure: IRRIGATION AND DEBRIDEMENT OF LEFT FOOT AND ANKLE BURN WOUNDS WITH SURGICAL PREPS ;  Surgeon: Theodoro Kos, DO;  Location: [place];  Service: Plastics;  Laterality: Left;  . INCISION AND DRAINAGE OF WOUND Left 02/20/2015   Procedure: LEFT LEG WOUND IRRIGATION AND DEBRIDEMENT WITH ACELL/VAC PLACEMENT;  Surgeon: Theodoro Kos, DO;  Location: [place];  Service: Plastics;  Laterality: Left;  . JOINT REPLACEMENT Left    Knee  . JOINT REPLACEMENT Left    Shoulder  . LARYNGOPLASTY  2011   @ [facility]    .  THYROIDECTOMY  11-2008  . TOTAL SHOULDER ARTHROPLASTY Right 05/16/2014   Procedure: TOTAL SHOULDER ARTHROPLASTY;  Surgeon: Ninetta Lights, MD;  Location: [place];  Service: Orthopedics;  Laterality: Right;  . vocal laryngoplasty     s/p left vocal fold medialization laryngoplasty with Goretex 02/07/10 (Dr. Wonda Amis)    Current Medications: Current Meds  Medication Sig  . ALPRAZolam (XANAX) 0.5 MG tablet Take 0.5 mg by mouth at bedtime.   Marland Kitchen amLODipine (NORVASC) 10 MG tablet Take 10 mg by mouth daily.  Marland Kitchen aspirin EC 81 MG tablet Take 81 mg by mouth daily.  . Azelastine HCl (ASTEPRO) 0.15 % SOLN  Place 2 sprays into the nose at bedtime.  Marland Kitchen doxazosin (CARDURA) 2 MG tablet Take two 2) tablets (4 mg) by mouth each morning and four (4) tablets (8 mg) by mouth each evening.  Marland Kitchen esomeprazole (NEXIUM) 40 MG capsule Take 40 mg by mouth daily as needed (ACID REFLUX).   . furosemide (LASIX) 40 MG tablet Take 20 mg by mouth daily.  Marland Kitchen HYDROcodone-acetaminophen (NORCO) 5-325 MG tablet Take 0.5-1 tablets by mouth See admin instructions. Take 1 tablet every night at bedtime, can take one during the day as needed for pain  . irbesartan (AVAPRO) 150 MG tablet Take 150 mg by mouth 2 (two) times daily.  Marland Kitchen levETIRAcetam (KEPPRA) 500 MG tablet Take 500 mg by mouth daily.  Marland Kitchen levothyroxine (SYNTHROID, LEVOTHROID) 100 MCG tablet Take 100 mcg by mouth every morning.  . metFORMIN (GLUCOPHAGE) 1000 MG tablet Take 1,000 mg by mouth 2 (two) times daily with a meal.  . morphine (MSIR) 15 MG tablet Take 1 tablet (15 mg total) by mouth every 4 (four) hours as needed for severe pain.  . Multiple Vitamins-Minerals (VISION VITAMINS PO) Take 1 tablet by mouth daily.  . ondansetron (ZOFRAN-ODT) 4 MG disintegrating tablet Take 4 mg by mouth every 4 (four) hours as needed for nausea or vomiting.  . rosuvastatin (CRESTOR) 20 MG tablet Take 20 mg by mouth daily.  . sitaGLIPtin (JANUVIA) 100 MG tablet Take 100 mg by mouth daily.     Allergies:   Clonidine derivatives; Glimepiride; and Invokana [canagliflozin]   Social History   Socioeconomic History  . Marital status: Married    Spouse name: Not on file  . Number of children: 3  . Years of education: 54  . Highest education level: Not on file  Occupational History    Comment: retired  Scientific laboratory technician  . Financial resource strain: Not on file  . Food insecurity:    Worry: Not on file    Inability: Not on file  . Transportation needs:    Medical: Not on file    Non-medical: Not on file  Tobacco Use  . Smoking status: Never Smoker  . Smokeless tobacco: Never Used    Substance and Sexual Activity  . Alcohol use: No  . Drug use: No  . Sexual activity: Not on file  Lifestyle  . Physical activity:    Days per week: Not on file    Minutes per session: Not on file  . Stress: Not on file  Relationships  . Social connections:    Talks on phone: Not on file    Gets together: Not on file    Attends religious service: Not on file    Active member of club or organization: Not on file    Attends meetings of clubs or organizations: Not on file    Relationship status: Not on  file  Other Topics Concern  . Not on file  Social History Narrative   Lives with husband- his caregiver     Family History: The patient's family history includes Multiple sclerosis in her sister; Prostate cancer in her brother; Stomach cancer in her mother; Stroke in her brother and brother.  ROS:   Please see the history of present illness.    Cough, low back pain, joint swelling, difficulty with balance, anxiety, and history of snoring.  All other systems reviewed and are negative.  EKGs/Labs/Other Studies Reviewed:    The following studies were reviewed today: 2D Doppler echocardiogram 04/29/2018: Study Conclusions   - Left ventricle: The cavity size was normal. Wall thickness was   increased in a pattern of mild LVH. Systolic function was   vigorous. The estimated ejection fraction was in the range of 65%   to 70%. Wall motion was normal; there were no regional wall   motion abnormalities. Doppler parameters are consistent with   abnormal left ventricular relaxation (grade 1 diastolic   dysfunction). - Aortic valve: Trileaflet; moderately thickened, moderately   calcified leaflets. Valve mobility was restricted. There was   moderate stenosis. There was mild regurgitation. Peak velocity   (S): 386 cm/s. Mean gradient (S): 36 mm Hg. - Mitral valve: There was mild regurgitation. - Tricuspid valve: There was trivial regurgitation. - Pulmonary arteries: Systolic pressure  was mildly increased. PA   peak pressure: 32 mm Hg (S).   Impressions:   - Aortic stenosis remains moderate but has advanced from prior   study.  48-hour Holter monitor March 24, 2018: Study Highlights      Normal sinus rhythm  Frequent PVCs with occasional bigeminy and trigeminy. 16% of all activity is premature ventricular ectopic in origin.  Occasional supraventricular runs up to 11 beats. No evidence of atrial fibrillation.  No sustained tachy- or bradycardia arrhythmias   HEART RATE EPISODES Minimum HR: 52 BPM at 4:10:59 AM(2) Maximum HR: 119 BPM at 9:27:30 PM Average HR: 72 BPM       EKG:  EKG is not ordered today.   Recent Labs: 08/24/2017: ALT 14; BUN 13; Creatinine, Ser 0.65; Hemoglobin 12.2; Platelets 211; Potassium 4.2; Sodium 134  Recent Lipid Panel    Component Value Date/Time   CHOL 132 07/24/2014 0450   TRIG 113 07/24/2014 0450   HDL 48 07/24/2014 0450   CHOLHDL 2.8 07/24/2014 0450   VLDL 23 07/24/2014 0450   LDLCALC 61 07/24/2014 0450    Physical Exam:    VS:  BP 128/74   Pulse 77   Ht 5\' 4"  (1.626 m)   Wt 188 lb (85.3 kg)   LMP  (LMP Unknown)   BMI 32.27 kg/m     Wt Readings from Last 3 Encounters:  05/05/18 188 lb (85.3 kg)  08/25/17 186 lb (84.4 kg)  08/24/17 186 lb (84.4 kg)     GEN:  Well nourished, well developed in no acute distress HEENT: Normal NECK: No JVD. LYMPHATICS: No lymphadenopathy CARDIAC: RRR, 3/6 crescendo decrescendo systolic aortic stenosis murmur, no gallop, trace bilateral ankle edema. VASCULAR: 2+ radial and carotid bilateral pulses.  Transmitted from aortic valve --> bruits. RESPIRATORY:  Clear to auscultation without rales, wheezing or rhonchi  ABDOMEN: Soft, non-tender, non-distended, No pulsatile mass, MUSCULOSKELETAL: No deformity  SKIN: Warm and dry NEUROLOGIC:  Alert and oriented x 3 PSYCHIATRIC:  Normal affect   ASSESSMENT:    1. Calcific aortic stenosis   2. Tachycardia-bradycardia syndrome  ([place])  3. Essential hypertension   4. Right bundle branch block   5. Subdural hematoma (HCC)    PLAN:    In order of problems listed above:  1. Moderate in severity with progression year over year.  Most recent aortic valve velocity is 3.6 m/s. 2. Clinical diagnosis but without indication for pacemaker at this point.  May need continuous ambulatory monitoring and she will notify us if progressive symptoms.  Atrial fibrillation is not yet been identified. 3. Blood pressure is adequately controlled.  She was mistakenly placed on diltiazem and had significant bradycardia.  That medication was subsequently discontinued. 4. EKG not repeated. 5. Not addressed.  Long discussion concerning aortic valve stenosis, treatment modalities, and natural history.  Also discussed symptom complex.  Clinical follow-up in 1 year with echo.  Call if prolonged palpitations.  Alerted that she may need to have monitoring for a longer period of time to identify significant underlying arrhythmia.   Medication Adjustments/Labs and Tests Ordered: Current medicines are reviewed at length with the patient today.  Concerns regarding medicines are outlined above.  Orders Placed This Encounter  Procedures  . ECHOCARDIOGRAM COMPLETE   No orders of the defined types were placed in this encounter.   Patient Instructions  Medication Instructions:  Your physician recommends that you continue on your current medications as directed. Please refer to the Current Medication list given to you today.   Labwork: None   Testing/Procedures: Your physician has requested that you have an echocardiogram in 1 year 2-3 weeks prior to your annual follow-up. Echocardiography is a painless test that uses sound waves to create images of your heart. It provides your doctor with information about the size and shape of your heart and how well your heart's chambers and valves are working. This procedure takes approximately one hour. There  are no restrictions for this procedure.    Follow-Up: Your physician wants you to follow-up in 1 year with Dr. Tamala Julian. You will receive a reminder letter in the mail two months in advance. If you don't receive a letter, please call our office to schedule the follow-up appointment.   Any Other Special Instructions Will Be Listed Below (If Applicable).     If you need a refill on your cardiac medications before your next appointment, please call your pharmacy.      Signed, Sinclair Grooms, MD  05/05/2018 2:24 PM    [place]

## 2018-05-05 ENCOUNTER — Encounter: Payer: Self-pay | Admitting: Interventional Cardiology

## 2018-05-05 ENCOUNTER — Ambulatory Visit (INDEPENDENT_AMBULATORY_CARE_PROVIDER_SITE_OTHER): Payer: Medicare Other | Admitting: Interventional Cardiology

## 2018-05-05 VITALS — BP 128/74 | HR 77 | Ht 64.0 in | Wt 188.0 lb

## 2018-05-05 DIAGNOSIS — I495 Sick sinus syndrome: Secondary | ICD-10-CM

## 2018-05-05 DIAGNOSIS — I10 Essential (primary) hypertension: Secondary | ICD-10-CM

## 2018-05-05 DIAGNOSIS — I350 Nonrheumatic aortic (valve) stenosis: Secondary | ICD-10-CM

## 2018-05-05 DIAGNOSIS — I4510 Unspecified right bundle-branch block: Secondary | ICD-10-CM | POA: Diagnosis not present

## 2018-05-05 DIAGNOSIS — S065XAA Traumatic subdural hemorrhage with loss of consciousness status unknown, initial encounter: Secondary | ICD-10-CM

## 2018-05-05 DIAGNOSIS — S065X9A Traumatic subdural hemorrhage with loss of consciousness of unspecified duration, initial encounter: Secondary | ICD-10-CM

## 2018-05-05 NOTE — Patient Instructions (Signed)
Medication Instructions:  Your physician recommends that you continue on your current medications as directed. Please refer to the Current Medication list given to you today.   Labwork: None   Testing/Procedures: Your physician has requested that you have an echocardiogram in 1 year 2-3 weeks prior to your annual follow-up. Echocardiography is a painless test that uses sound waves to create images of your heart. It provides your doctor with information about the size and shape of your heart and how well your heart's chambers and valves are working. This procedure takes approximately one hour. There are no restrictions for this procedure.    Follow-Up: Your physician wants you to follow-up in 1 year with Dr. Tamala Julian. You will receive a reminder letter in the mail two months in advance. If you don't receive a letter, please call our office to schedule the follow-up appointment.   Any Other Special Instructions Will Be Listed Below (If Applicable).     If you need a refill on your cardiac medications before your next appointment, please call your pharmacy.

## 2018-05-20 DIAGNOSIS — M25551 Pain in right hip: Secondary | ICD-10-CM | POA: Diagnosis not present

## 2018-05-20 DIAGNOSIS — G894 Chronic pain syndrome: Secondary | ICD-10-CM | POA: Diagnosis not present

## 2018-05-20 DIAGNOSIS — M47816 Spondylosis without myelopathy or radiculopathy, lumbar region: Secondary | ICD-10-CM | POA: Diagnosis not present

## 2018-05-20 DIAGNOSIS — R1013 Epigastric pain: Secondary | ICD-10-CM | POA: Diagnosis not present

## 2018-05-30 DIAGNOSIS — M5134 Other intervertebral disc degeneration, thoracic region: Secondary | ICD-10-CM | POA: Diagnosis not present

## 2018-05-30 DIAGNOSIS — M9901 Segmental and somatic dysfunction of cervical region: Secondary | ICD-10-CM | POA: Diagnosis not present

## 2018-05-30 DIAGNOSIS — M5031 Other cervical disc degeneration,  high cervical region: Secondary | ICD-10-CM | POA: Diagnosis not present

## 2018-05-30 DIAGNOSIS — M9902 Segmental and somatic dysfunction of thoracic region: Secondary | ICD-10-CM | POA: Diagnosis not present

## 2018-06-22 DIAGNOSIS — K219 Gastro-esophageal reflux disease without esophagitis: Secondary | ICD-10-CM | POA: Diagnosis not present

## 2018-06-22 DIAGNOSIS — R1013 Epigastric pain: Secondary | ICD-10-CM | POA: Diagnosis not present

## 2018-06-22 DIAGNOSIS — Z86010 Personal history of colonic polyps: Secondary | ICD-10-CM | POA: Diagnosis not present

## 2018-06-24 ENCOUNTER — Other Ambulatory Visit: Payer: Self-pay | Admitting: Gastroenterology

## 2018-06-24 DIAGNOSIS — R1013 Epigastric pain: Secondary | ICD-10-CM

## 2018-07-04 DIAGNOSIS — M9901 Segmental and somatic dysfunction of cervical region: Secondary | ICD-10-CM | POA: Diagnosis not present

## 2018-07-04 DIAGNOSIS — M9902 Segmental and somatic dysfunction of thoracic region: Secondary | ICD-10-CM | POA: Diagnosis not present

## 2018-07-04 DIAGNOSIS — M5031 Other cervical disc degeneration,  high cervical region: Secondary | ICD-10-CM | POA: Diagnosis not present

## 2018-07-04 DIAGNOSIS — M5134 Other intervertebral disc degeneration, thoracic region: Secondary | ICD-10-CM | POA: Diagnosis not present

## 2018-07-05 ENCOUNTER — Ambulatory Visit
Admission: RE | Admit: 2018-07-05 | Discharge: 2018-07-05 | Disposition: A | Discharge status: Home / Self Care | Payer: Medicare Other | Source: Ambulatory Visit | Attending: Gastroenterology | Admitting: Gastroenterology

## 2018-07-05 DIAGNOSIS — R1013 Epigastric pain: Secondary | ICD-10-CM

## 2018-07-05 DIAGNOSIS — K449 Diaphragmatic hernia without obstruction or gangrene: Secondary | ICD-10-CM | POA: Diagnosis not present

## 2018-07-05 MED ORDER — IOPAMIDOL (ISOVUE-300) INJECTION 61%
100.0000 mL | Freq: Once | INTRAVENOUS | Status: AC | PRN
  Administered 2018-07-05: 100 mL via INTRAVENOUS

## 2018-07-07 DIAGNOSIS — M5134 Other intervertebral disc degeneration, thoracic region: Secondary | ICD-10-CM | POA: Diagnosis not present

## 2018-07-07 DIAGNOSIS — M9902 Segmental and somatic dysfunction of thoracic region: Secondary | ICD-10-CM | POA: Diagnosis not present

## 2018-07-07 DIAGNOSIS — M9901 Segmental and somatic dysfunction of cervical region: Secondary | ICD-10-CM | POA: Diagnosis not present

## 2018-07-07 DIAGNOSIS — M5031 Other cervical disc degeneration,  high cervical region: Secondary | ICD-10-CM | POA: Diagnosis not present

## 2018-07-08 ENCOUNTER — Other Ambulatory Visit: Payer: Self-pay | Admitting: Gastroenterology

## 2018-07-08 DIAGNOSIS — K8689 Other specified diseases of pancreas: Secondary | ICD-10-CM

## 2018-07-11 DIAGNOSIS — M5134 Other intervertebral disc degeneration, thoracic region: Secondary | ICD-10-CM | POA: Diagnosis not present

## 2018-07-11 DIAGNOSIS — M9901 Segmental and somatic dysfunction of cervical region: Secondary | ICD-10-CM | POA: Diagnosis not present

## 2018-07-11 DIAGNOSIS — M9902 Segmental and somatic dysfunction of thoracic region: Secondary | ICD-10-CM | POA: Diagnosis not present

## 2018-07-11 DIAGNOSIS — M5031 Other cervical disc degeneration,  high cervical region: Secondary | ICD-10-CM | POA: Diagnosis not present

## 2018-07-12 DIAGNOSIS — M9902 Segmental and somatic dysfunction of thoracic region: Secondary | ICD-10-CM | POA: Diagnosis not present

## 2018-07-12 DIAGNOSIS — M9901 Segmental and somatic dysfunction of cervical region: Secondary | ICD-10-CM | POA: Diagnosis not present

## 2018-07-12 DIAGNOSIS — M5134 Other intervertebral disc degeneration, thoracic region: Secondary | ICD-10-CM | POA: Diagnosis not present

## 2018-07-12 DIAGNOSIS — M5031 Other cervical disc degeneration,  high cervical region: Secondary | ICD-10-CM | POA: Diagnosis not present

## 2018-07-19 DIAGNOSIS — M5134 Other intervertebral disc degeneration, thoracic region: Secondary | ICD-10-CM | POA: Diagnosis not present

## 2018-07-19 DIAGNOSIS — M9902 Segmental and somatic dysfunction of thoracic region: Secondary | ICD-10-CM | POA: Diagnosis not present

## 2018-07-19 DIAGNOSIS — M9901 Segmental and somatic dysfunction of cervical region: Secondary | ICD-10-CM | POA: Diagnosis not present

## 2018-07-19 DIAGNOSIS — M5031 Other cervical disc degeneration,  high cervical region: Secondary | ICD-10-CM | POA: Diagnosis not present

## 2018-07-21 DIAGNOSIS — M9901 Segmental and somatic dysfunction of cervical region: Secondary | ICD-10-CM | POA: Diagnosis not present

## 2018-07-21 DIAGNOSIS — M5031 Other cervical disc degeneration,  high cervical region: Secondary | ICD-10-CM | POA: Diagnosis not present

## 2018-07-21 DIAGNOSIS — M5134 Other intervertebral disc degeneration, thoracic region: Secondary | ICD-10-CM | POA: Diagnosis not present

## 2018-07-21 DIAGNOSIS — M9902 Segmental and somatic dysfunction of thoracic region: Secondary | ICD-10-CM | POA: Diagnosis not present

## 2018-07-29 ENCOUNTER — Ambulatory Visit
Admission: RE | Admit: 2018-07-29 | Discharge: 2018-07-29 | Disposition: A | Discharge status: Home / Self Care | Payer: Medicare Other | Source: Ambulatory Visit | Attending: Gastroenterology | Admitting: Gastroenterology

## 2018-07-29 DIAGNOSIS — K869 Disease of pancreas, unspecified: Secondary | ICD-10-CM | POA: Diagnosis not present

## 2018-07-29 DIAGNOSIS — K760 Fatty (change of) liver, not elsewhere classified: Secondary | ICD-10-CM | POA: Diagnosis not present

## 2018-07-29 DIAGNOSIS — K8689 Other specified diseases of pancreas: Secondary | ICD-10-CM

## 2018-07-29 MED ORDER — GADOBENATE DIMEGLUMINE 529 MG/ML IV SOLN
17.0000 mL | Freq: Once | INTRAVENOUS | Status: AC | PRN
  Administered 2018-07-29: 17 mL via INTRAVENOUS

## 2018-08-01 ENCOUNTER — Other Ambulatory Visit: Payer: Self-pay | Admitting: Orthopedic Surgery

## 2018-08-01 DIAGNOSIS — M25561 Pain in right knee: Secondary | ICD-10-CM | POA: Diagnosis not present

## 2018-08-01 DIAGNOSIS — M545 Low back pain, unspecified: Secondary | ICD-10-CM

## 2018-08-03 ENCOUNTER — Other Ambulatory Visit: Payer: Medicare Other

## 2018-08-23 ENCOUNTER — Ambulatory Visit
Admission: RE | Admit: 2018-08-23 | Discharge: 2018-08-23 | Disposition: A | Discharge status: Home / Self Care | Payer: Medicare Other | Source: Ambulatory Visit | Attending: Orthopedic Surgery | Admitting: Orthopedic Surgery

## 2018-08-23 DIAGNOSIS — M545 Low back pain, unspecified: Secondary | ICD-10-CM

## 2018-08-23 DIAGNOSIS — M48061 Spinal stenosis, lumbar region without neurogenic claudication: Secondary | ICD-10-CM | POA: Diagnosis not present

## 2018-08-24 ENCOUNTER — Emergency Department (HOSPITAL_COMMUNITY)
Admission: EM | Admit: 2018-08-24 | Discharge: 2018-08-25 | Disposition: A | Discharge status: Home / Self Care | Payer: Medicare Other | Attending: Emergency Medicine | Admitting: Emergency Medicine

## 2018-08-24 ENCOUNTER — Encounter (HOSPITAL_COMMUNITY): Payer: Self-pay

## 2018-08-24 ENCOUNTER — Emergency Department (HOSPITAL_COMMUNITY): Payer: Medicare Other

## 2018-08-24 DIAGNOSIS — Z96612 Presence of left artificial shoulder joint: Secondary | ICD-10-CM | POA: Insufficient documentation

## 2018-08-24 DIAGNOSIS — Z7984 Long term (current) use of oral hypoglycemic drugs: Secondary | ICD-10-CM | POA: Insufficient documentation

## 2018-08-24 DIAGNOSIS — R42 Dizziness and giddiness: Secondary | ICD-10-CM | POA: Diagnosis not present

## 2018-08-24 DIAGNOSIS — E039 Hypothyroidism, unspecified: Secondary | ICD-10-CM | POA: Insufficient documentation

## 2018-08-24 DIAGNOSIS — I10 Essential (primary) hypertension: Secondary | ICD-10-CM | POA: Insufficient documentation

## 2018-08-24 DIAGNOSIS — R002 Palpitations: Secondary | ICD-10-CM | POA: Diagnosis present

## 2018-08-24 DIAGNOSIS — R918 Other nonspecific abnormal finding of lung field: Secondary | ICD-10-CM | POA: Diagnosis not present

## 2018-08-24 DIAGNOSIS — Z96611 Presence of right artificial shoulder joint: Secondary | ICD-10-CM | POA: Diagnosis not present

## 2018-08-24 DIAGNOSIS — I493 Ventricular premature depolarization: Secondary | ICD-10-CM

## 2018-08-24 DIAGNOSIS — E119 Type 2 diabetes mellitus without complications: Secondary | ICD-10-CM | POA: Diagnosis not present

## 2018-08-24 DIAGNOSIS — Z7982 Long term (current) use of aspirin: Secondary | ICD-10-CM | POA: Insufficient documentation

## 2018-08-24 DIAGNOSIS — Z79899 Other long term (current) drug therapy: Secondary | ICD-10-CM | POA: Insufficient documentation

## 2018-08-24 DIAGNOSIS — Z96652 Presence of left artificial knee joint: Secondary | ICD-10-CM | POA: Diagnosis not present

## 2018-08-24 LAB — CBC
HCT: 37.7 % (ref 36.0–46.0)
Hemoglobin: 12.4 g/dL (ref 12.0–15.0)
MCH: 30 pg (ref 26.0–34.0)
MCHC: 32.9 g/dL (ref 30.0–36.0)
MCV: 91.3 fL (ref 80.0–100.0)
Platelets: 138 10*3/uL — ABNORMAL LOW (ref 150–400)
RBC: 4.13 MIL/uL (ref 3.87–5.11)
RDW: 13.6 % (ref 11.5–15.5)
WBC: 4.8 10*3/uL (ref 4.0–10.5)
nRBC: 0 % (ref 0.0–0.2)

## 2018-08-24 LAB — BASIC METABOLIC PANEL
Anion gap: 10 (ref 5–15)
BUN: 11 mg/dL (ref 8–23)
CO2: 25 mmol/L (ref 22–32)
Calcium: 10.2 mg/dL (ref 8.9–10.3)
Chloride: 98 mmol/L (ref 98–111)
Creatinine, Ser: 0.75 mg/dL (ref 0.44–1.00)
GFR calc Af Amer: >60 mL/min (ref >60)
GFR calc non Af Amer: >60 mL/min (ref >60)
Glucose, Bld: 119 mg/dL — ABNORMAL HIGH (ref 70–99)
Potassium: 4.2 mmol/L (ref 3.5–5.1)
Sodium: 133 mmol/L — ABNORMAL LOW (ref 135–145)

## 2018-08-24 LAB — I-STAT TROPONIN, ED: Troponin i, poc: 0.01 ng/mL (ref 0.00–0.08)

## 2018-08-24 NOTE — Discharge Instructions (Addendum)
Continue your current meds list, avoid any caffeine, follow up with your cardiologist if the symptoms persist

## 2018-08-24 NOTE — ED Notes (Signed)
Pt in radiology 

## 2018-08-24 NOTE — ED Provider Notes (Signed)
[facility] EMERGENCY DEPARTMENT Provider Note   CSN: [id number] Arrival date & time: 08/24/18  2057     History   Chief Complaint Chief Complaint  Patient presents with  . Irregular Heart Beat    HPI Hailey Cobb is a 85 y.o. female.  HPI Patient presents to the emergency room for evaluation of palpitations.  Patient states she has had an odd sensation in the center of her chest.  It comes and goes and it feels like her heart skips a beat.  She has not had any trouble with any chest pain or dizziness but this sensation is uncomfortable.  Patient states she  used her blood pressure cuff to check her pulse and blood pressure and it has read her heart rate ranging from the 70s to the 40s.  Patient went to an outpatient clinic and they suggested she come to the ED.  Patient does not have any history of irregular heart rhythms.  She does have a history of aortic stenosis. Past Medical History:  Diagnosis Date  . Anxiety   . Autoimmune hepatitis ([place])   . Complication of anesthesia    has plastic on her vocal  cords due to difficult thyroidectomy  . Diabetes mellitus without complication (HCC)    Type 2   . Environmental allergies   . GERD (gastroesophageal reflux disease)   . Heart murmur    mild AS by 01/2010 echo [facility]); moderate AS 07/24/14 echo [facility])  . Hepatitis    Autoimmune   . Hypercholesteremia   . Hypertension   . Hypothyroidism   . IBS (irritable bowel syndrome)   . Mucus pooling in larynx    Begin after having thyroidectomy; pt takes Mucus medication nightly  . Osteoarthritis   . RBBB (right bundle branch block)    SEE EKG   . S/P dilatation of esophageal stricture   . Seizures ([place])    subdural hematoma 2015  . Shortness of breath    with exertion  . Subdural hematoma ([place])   . Unilateral vocal cord paralysis   . Vitiligo     Patient Active Problem List   Diagnosis Date Noted  . Ureteral calculus 08/11/2017  . Tachycardia-bradycardia  syndrome ([place]) 05/01/2017  . Symptomatic bradycardia 02/06/2017  . Dehydration 02/06/2017  . Hyperkalemia 02/06/2017  . Hypercalcemia 02/06/2017  . Right bundle branch block 07/30/2015  . Aortic stenosis 07/29/2015  . Cellulitis of left leg 12/20/2014  . Diabetes mellitus type 2, controlled ([place]) 12/20/2014  . Cellulitis 12/20/2014  . Acute left-sided weakness   . Cerebral infarction ([place]) 07/23/2014  . Diabetes mellitus ([place]) 07/23/2014  . Anxiety 07/23/2014  . Hypothyroidism 07/23/2014  . Essential hypertension 07/23/2014  . Subdural hematoma ([place]) 07/07/2014  . Arthritis of shoulder region, right 05/16/2014    Past Surgical History:  Procedure Laterality Date  . ABDOMINAL HYSTERECTOMY    . APPENDECTOMY    . APPLICATION OF A-CELL OF EXTREMITY Left 12/27/2014   Procedure: PLACEMENT OF A-CELL ;  Surgeon: Theodoro Kos, DO;  Location: [place];  Service: Plastics;  Laterality: Left;  . CATARACT EXTRACTION W/ INTRAOCULAR LENS  IMPLANT, BILATERAL Bilateral   . CYSTOSCOPY/RETROGRADE/URETEROSCOPY Bilateral 08/11/2017   Procedure: CYSTOSCOPY/BILATERAL RETROGRADE AND BILATERAL STENT PLACEMENT;  Surgeon: Lucas Mallow, MD;  Location: [facility];  Service: Urology;  Laterality: Bilateral;  . CYSTOSCOPY/URETEROSCOPY/HOLMIUM LASER/STENT PLACEMENT Bilateral 08/25/2017   Procedure: CYSTOSCOPY/URETEROSCOPY/HOLMIUM LASER/STENT PLACEMENT, diagnosic right urteral stent removal;  Surgeon: Marton Redwood III,  MD;  Location: [facility];  Service: Urology;  Laterality: Bilateral;  . ELBOW BURSA SURGERY Left   . EYE SURGERY    . [name] 12/27/2014   Procedure: IRRIGATION AND DEBRIDEMENT OF LEFT FOOT AND ANKLE BURN WOUNDS WITH SURGICAL PREPS ;  Surgeon: Theodoro Kos, DO;  Location: [place];  Service: Plastics;  Laterality: Left;  . INCISION AND DRAINAGE OF WOUND Left 02/20/2015   Procedure: LEFT LEG WOUND IRRIGATION AND DEBRIDEMENT WITH ACELL/VAC PLACEMENT;   Surgeon: Theodoro Kos, DO;  Location: [place];  Service: Plastics;  Laterality: Left;  . JOINT REPLACEMENT Left    Knee  . JOINT REPLACEMENT Left    Shoulder  . LARYNGOPLASTY  2011   @ [facility]    . THYROIDECTOMY  11-2008  . TOTAL SHOULDER ARTHROPLASTY Right 05/16/2014   Procedure: TOTAL SHOULDER ARTHROPLASTY;  Surgeon: Ninetta Lights, MD;  Location: [place];  Service: Orthopedics;  Laterality: Right;  . vocal laryngoplasty     s/p left vocal fold medialization laryngoplasty with Goretex 02/07/10 (Dr. Wonda Amis)     OB History   No obstetric history on file.      Home Medications    Prior to Admission medications   Medication Sig Start Date End Date Taking? Authorizing Provider  ALPRAZolam Duanne Moron) 0.5 MG tablet Take 0.5 mg by mouth at bedtime.     [provider]  amLODipine (NORVASC) 10 MG tablet Take 10 mg by mouth daily.    [provider]  aspirin EC 81 MG tablet Take 81 mg by mouth daily.    [provider]  Azelastine HCl (ASTEPRO) 0.15 % SOLN Place 2 sprays into the nose at bedtime. 05/12/10   [provider]  doxazosin (CARDURA) 2 MG tablet Take two 2) tablets (4 mg) by mouth each morning and four (4) tablets (8 mg) by mouth each evening.    [provider]  esomeprazole (NEXIUM) 40 MG capsule Take 40 mg by mouth daily as needed (ACID REFLUX).     [provider]  furosemide (LASIX) 40 MG tablet Take 20 mg by mouth daily.    [provider]  HYDROcodone-acetaminophen (NORCO) 5-325 MG tablet Take 0.5-1 tablets by mouth See admin instructions. Take 1 tablet every night at bedtime, can take one during the day as needed for pain 08/25/17   Lucas Mallow, MD  irbesartan (AVAPRO) 150 MG tablet Take 150 mg by mouth 2 (two) times daily.    [provider]  levETIRAcetam (KEPPRA) 500 MG tablet Take 500 mg by mouth daily.    [provider]  levothyroxine (SYNTHROID, LEVOTHROID) 100 MCG tablet Take 100 mcg by  mouth every morning. 12/21/16   [provider]  metFORMIN (GLUCOPHAGE) 1000 MG tablet Take 1,000 mg by mouth 2 (two) times daily with a meal.    [provider]  morphine (MSIR) 15 MG tablet Take 1 tablet (15 mg total) by mouth every 4 (four) hours as needed for severe pain. 08/03/17   Deno Etienne, DO  Multiple Vitamins-Minerals (VISION VITAMINS PO) Take 1 tablet by mouth daily.    [provider]  ondansetron (ZOFRAN-ODT) 4 MG disintegrating tablet Take 4 mg by mouth every 4 (four) hours as needed for nausea or vomiting.    [provider]  rosuvastatin (CRESTOR) 20 MG tablet Take 20 mg by mouth daily.    [provider]  sitaGLIPtin (JANUVIA) 100 MG tablet Take 100 mg by mouth daily.  [provider]    Family History Family History  Problem Relation Age of Onset  . Stomach cancer Mother   . Stroke Brother   . Prostate cancer Brother   . Stroke Brother   . Multiple sclerosis Sister     Social History Social History   Tobacco Use  . Smoking status: Never Smoker  . Smokeless tobacco: Never Used  Substance Use Topics  . Alcohol use: No  . Drug use: No     Allergies   Metoprolol; Clonidine derivatives; Glimepiride; and Invokana [canagliflozin]   Review of Systems Review of Systems  All other systems reviewed and are negative.    Physical Exam Updated Vital Signs BP (!) 167/80 (BP Location: Right Arm)   Pulse 64   Temp 97.7 F (36.5 C) (Oral)   Resp 16   LMP  (LMP Unknown)   SpO2 96%   Physical Exam Vitals signs and nursing note reviewed.  Constitutional:      General: She is not in acute distress.    Appearance: She is well-developed.  HENT:     Head: Normocephalic and atraumatic.     Right Ear: External ear normal.     Left Ear: External ear normal.  Eyes:     General: No scleral icterus.       Right eye: No discharge.        Left eye: No discharge.     Conjunctiva/sclera: Conjunctivae normal.  Neck:      Musculoskeletal: Neck supple.     Trachea: No tracheal deviation.  Cardiovascular:     Rate and Rhythm: Normal rate and regular rhythm. Frequent extrasystoles are present.    Heart sounds: Murmur present. Systolic murmur present.  Pulmonary:     Effort: Pulmonary effort is normal. No respiratory distress.     Breath sounds: Normal breath sounds. No stridor. No wheezing or rales.  Abdominal:     General: Bowel sounds are normal. There is no distension.     Palpations: Abdomen is soft.     Tenderness: There is no abdominal tenderness. There is no guarding or rebound.  Musculoskeletal:        General: No tenderness.  Skin:    General: Skin is warm and dry.     Findings: No rash.  Neurological:     Mental Status: She is alert.     Cranial Nerves: No cranial nerve deficit (no facial droop, extraocular movements intact, no slurred speech).     Sensory: No sensory deficit.     Motor: No abnormal muscle tone or seizure activity.     Coordination: Coordination normal.      ED Treatments / Results  Labs (all labs ordered are listed, but only abnormal results are displayed) Labs Reviewed  BASIC METABOLIC PANEL - Abnormal; Notable for the following components:      Result Value   Sodium 133 (*)    Glucose, Bld 119 (*)    All other components within normal limits  CBC - Abnormal; Notable for the following components:   Platelets 138 (*)    All other components within normal limits  I-STAT TROPONIN, ED    EKG EKG Interpretation  Date/Time:  Wednesday August 24 2018 21:17:43 EST Ventricular Rate:  68 PR Interval:  178 QRS Duration: 130 QT Interval:  414 QTC Calculation: 440 R Axis:   -39 Text Interpretation:  Normal sinus rhythm Left axis deviation Right bundle branch block Abnormal ECG No significant change since last tracing Confirmed  by Dorie Rank [id number]) on 08/24/2018 10:08:57 PM   Radiology Dg Chest 2 View  Result Date: 08/24/2018 CLINICAL DATA:  Chest pressure.  EXAM: CHEST - 2 VIEW COMPARISON:  Radiograph 02/07/2017 FINDINGS: Borderline cardiomegaly with normal mediastinal contours. Aortic atherosclerosis. Interstitial opacities which has slightly increased from prior. Mild bibasilar atelectasis. Chronic blunting of left costophrenic angle. No large pleural effusion. No pneumothorax. Bilateral humeral arthroplasties. Degenerative change in the spine. Prior thyroidectomy with surgical clips at the thoracic inlet. IMPRESSION: 1. Borderline cardiomegaly with Aortic Atherosclerosis (ICD10-I70.0). 2. Increased interstitial opacities, may be interstitial edema or atypical infection. Electronically Signed   By: Keith Rake M.D.   On: 08/24/2018 22:19      Procedures Procedures (including critical care time)  Medications Ordered in ED Medications - No data to display   Initial Impression / Assessment and Plan / ED Course  I have reviewed the triage vital signs and the nursing notes.  Pertinent labs & imaging results that were available during my care of the patient were reviewed by me and considered in my medical decision making (see chart for details).  Clinical Course as of Aug 24 2330  Wed Aug 24, 2018  2328 rhythm was observed on the cardiac monitor.  She was noted to have PVCs.   [JK]  2328 Labs reviewed.  Unremarkable   [JK]  2330 Patient is not having any respiratory symptoms.  I doubt pulmonary edema or infection   [JK]    Clinical Course User Index [JK] Dorie Rank, MD   Patient presented to the emergency room for evaluation of palpitations.  Patient has PVCs on the cardiac monitor.  She was using electronic device to measure her pulse.  I suspect the device was getting inaccurate readings because of her PVCs.  Patient was monitored in the emergency room for period time.  She had no bradycardia.  She has not had any trouble with her chest pain or shortness of breath.  No other dysrhythmias noted.  Final Clinical Impressions(s) / ED  Diagnoses   Final diagnoses:  PVC's (premature ventricular contractions)    ED Discharge Orders    None       Dorie Rank, MD 08/24/18 2333

## 2018-08-24 NOTE — ED Triage Notes (Signed)
Pt here from PCP for irregular heart rhythm. Pt hr fluctuating from 70s to 40s. Pt states "I can feel it dropping" pt denies any dizziness or chest pain but states there is a pressure in the center of her chest.

## 2018-08-25 ENCOUNTER — Telehealth: Payer: Self-pay | Admitting: Interventional Cardiology

## 2018-08-25 NOTE — Telephone Encounter (Signed)
Spoke with daughter, DPR on file.  She states pt felt PVCs/palps more yesterday and went to after hours PCP and they told her to go to ER.  Pt did not get much sleep the night before because her husband has dementia and she was up and down with him.  Denies any other issues and feels fine today.  Advised daughter for her or pt to contact the office if she begins to notice the PVCs more often.  Daughter appreciative for call.

## 2018-08-25 NOTE — Telephone Encounter (Signed)
New Message:    Patient daughter calling concerning her mother. Patient went to the ER last night. Patient daughter would like for some one to call her back, she has a few questions.

## 2018-09-07 DIAGNOSIS — M48061 Spinal stenosis, lumbar region without neurogenic claudication: Secondary | ICD-10-CM | POA: Diagnosis not present

## 2018-09-07 DIAGNOSIS — M5416 Radiculopathy, lumbar region: Secondary | ICD-10-CM | POA: Diagnosis not present

## 2018-09-07 DIAGNOSIS — M545 Low back pain: Secondary | ICD-10-CM | POA: Diagnosis not present

## 2018-09-19 DIAGNOSIS — M5416 Radiculopathy, lumbar region: Secondary | ICD-10-CM | POA: Diagnosis not present

## 2018-09-19 DIAGNOSIS — M545 Low back pain: Secondary | ICD-10-CM | POA: Diagnosis not present

## 2018-09-19 DIAGNOSIS — M48061 Spinal stenosis, lumbar region without neurogenic claudication: Secondary | ICD-10-CM | POA: Diagnosis not present

## 2018-10-06 DIAGNOSIS — M1990 Unspecified osteoarthritis, unspecified site: Secondary | ICD-10-CM | POA: Diagnosis not present

## 2018-10-06 DIAGNOSIS — G894 Chronic pain syndrome: Secondary | ICD-10-CM | POA: Diagnosis not present

## 2018-10-06 DIAGNOSIS — F419 Anxiety disorder, unspecified: Secondary | ICD-10-CM | POA: Diagnosis not present

## 2018-10-10 DIAGNOSIS — M9901 Segmental and somatic dysfunction of cervical region: Secondary | ICD-10-CM | POA: Diagnosis not present

## 2018-10-10 DIAGNOSIS — M5416 Radiculopathy, lumbar region: Secondary | ICD-10-CM | POA: Diagnosis not present

## 2018-10-10 DIAGNOSIS — M48061 Spinal stenosis, lumbar region without neurogenic claudication: Secondary | ICD-10-CM | POA: Diagnosis not present

## 2018-10-10 DIAGNOSIS — M545 Low back pain: Secondary | ICD-10-CM | POA: Diagnosis not present

## 2018-10-10 DIAGNOSIS — M50322 Other cervical disc degeneration at C5-C6 level: Secondary | ICD-10-CM | POA: Diagnosis not present

## 2018-10-10 DIAGNOSIS — M9902 Segmental and somatic dysfunction of thoracic region: Secondary | ICD-10-CM | POA: Diagnosis not present

## 2018-10-10 DIAGNOSIS — M5134 Other intervertebral disc degeneration, thoracic region: Secondary | ICD-10-CM | POA: Diagnosis not present

## 2018-10-13 DIAGNOSIS — M5134 Other intervertebral disc degeneration, thoracic region: Secondary | ICD-10-CM | POA: Diagnosis not present

## 2018-10-13 DIAGNOSIS — M50322 Other cervical disc degeneration at C5-C6 level: Secondary | ICD-10-CM | POA: Diagnosis not present

## 2018-10-13 DIAGNOSIS — M9901 Segmental and somatic dysfunction of cervical region: Secondary | ICD-10-CM | POA: Diagnosis not present

## 2018-10-13 DIAGNOSIS — M9902 Segmental and somatic dysfunction of thoracic region: Secondary | ICD-10-CM | POA: Diagnosis not present

## 2018-10-19 DIAGNOSIS — R1013 Epigastric pain: Secondary | ICD-10-CM | POA: Diagnosis not present

## 2018-10-19 DIAGNOSIS — K219 Gastro-esophageal reflux disease without esophagitis: Secondary | ICD-10-CM | POA: Diagnosis not present

## 2018-12-15 DIAGNOSIS — R1033 Periumbilical pain: Secondary | ICD-10-CM | POA: Diagnosis not present

## 2018-12-17 ENCOUNTER — Emergency Department (HOSPITAL_COMMUNITY): Payer: Medicare Other

## 2018-12-17 ENCOUNTER — Inpatient Hospital Stay (HOSPITAL_COMMUNITY)
Admission: EM | Admit: 2018-12-17 | Discharge: 2018-12-19 | DRG: 392 | Disposition: A | Discharge status: Home / Self Care | Payer: Medicare Other | Attending: Internal Medicine | Admitting: Internal Medicine

## 2018-12-17 ENCOUNTER — Encounter (HOSPITAL_COMMUNITY): Payer: Self-pay

## 2018-12-17 ENCOUNTER — Other Ambulatory Visit: Payer: Self-pay

## 2018-12-17 DIAGNOSIS — J9811 Atelectasis: Secondary | ICD-10-CM | POA: Diagnosis present

## 2018-12-17 DIAGNOSIS — Z7984 Long term (current) use of oral hypoglycemic drugs: Secondary | ICD-10-CM

## 2018-12-17 DIAGNOSIS — I4510 Unspecified right bundle-branch block: Secondary | ICD-10-CM | POA: Diagnosis present

## 2018-12-17 DIAGNOSIS — R569 Unspecified convulsions: Secondary | ICD-10-CM | POA: Diagnosis present

## 2018-12-17 DIAGNOSIS — I110 Hypertensive heart disease with heart failure: Secondary | ICD-10-CM | POA: Diagnosis present

## 2018-12-17 DIAGNOSIS — Z888 Allergy status to other drugs, medicaments and biological substances status: Secondary | ICD-10-CM

## 2018-12-17 DIAGNOSIS — K5730 Diverticulosis of large intestine without perforation or abscess without bleeding: Secondary | ICD-10-CM | POA: Diagnosis not present

## 2018-12-17 DIAGNOSIS — R7989 Other specified abnormal findings of blood chemistry: Secondary | ICD-10-CM | POA: Diagnosis present

## 2018-12-17 DIAGNOSIS — I350 Nonrheumatic aortic (valve) stenosis: Secondary | ICD-10-CM | POA: Diagnosis present

## 2018-12-17 DIAGNOSIS — Z9841 Cataract extraction status, right eye: Secondary | ICD-10-CM

## 2018-12-17 DIAGNOSIS — K219 Gastro-esophageal reflux disease without esophagitis: Secondary | ICD-10-CM | POA: Diagnosis not present

## 2018-12-17 DIAGNOSIS — Z8042 Family history of malignant neoplasm of prostate: Secondary | ICD-10-CM

## 2018-12-17 DIAGNOSIS — Z96611 Presence of right artificial shoulder joint: Secondary | ICD-10-CM | POA: Diagnosis present

## 2018-12-17 DIAGNOSIS — Z90710 Acquired absence of both cervix and uterus: Secondary | ICD-10-CM

## 2018-12-17 DIAGNOSIS — K589 Irritable bowel syndrome without diarrhea: Secondary | ICD-10-CM | POA: Diagnosis not present

## 2018-12-17 DIAGNOSIS — Z9842 Cataract extraction status, left eye: Secondary | ICD-10-CM

## 2018-12-17 DIAGNOSIS — I10 Essential (primary) hypertension: Secondary | ICD-10-CM

## 2018-12-17 DIAGNOSIS — E785 Hyperlipidemia, unspecified: Secondary | ICD-10-CM | POA: Diagnosis not present

## 2018-12-17 DIAGNOSIS — E871 Hypo-osmolality and hyponatremia: Secondary | ICD-10-CM | POA: Diagnosis not present

## 2018-12-17 DIAGNOSIS — E039 Hypothyroidism, unspecified: Secondary | ICD-10-CM | POA: Diagnosis not present

## 2018-12-17 DIAGNOSIS — F419 Anxiety disorder, unspecified: Secondary | ICD-10-CM | POA: Diagnosis not present

## 2018-12-17 DIAGNOSIS — Z20828 Contact with and (suspected) exposure to other viral communicable diseases: Secondary | ICD-10-CM | POA: Diagnosis not present

## 2018-12-17 DIAGNOSIS — I5032 Chronic diastolic (congestive) heart failure: Secondary | ICD-10-CM | POA: Diagnosis present

## 2018-12-17 DIAGNOSIS — R1033 Periumbilical pain: Secondary | ICD-10-CM

## 2018-12-17 DIAGNOSIS — I495 Sick sinus syndrome: Secondary | ICD-10-CM | POA: Diagnosis not present

## 2018-12-17 DIAGNOSIS — R109 Unspecified abdominal pain: Secondary | ICD-10-CM | POA: Diagnosis not present

## 2018-12-17 DIAGNOSIS — E7800 Pure hypercholesterolemia, unspecified: Secondary | ICD-10-CM | POA: Diagnosis present

## 2018-12-17 DIAGNOSIS — Z79891 Long term (current) use of opiate analgesic: Secondary | ICD-10-CM

## 2018-12-17 DIAGNOSIS — R0602 Shortness of breath: Secondary | ICD-10-CM | POA: Diagnosis not present

## 2018-12-17 DIAGNOSIS — Z800 Family history of malignant neoplasm of digestive organs: Secondary | ICD-10-CM

## 2018-12-17 DIAGNOSIS — Z7982 Long term (current) use of aspirin: Secondary | ICD-10-CM

## 2018-12-17 DIAGNOSIS — Z961 Presence of intraocular lens: Secondary | ICD-10-CM | POA: Diagnosis present

## 2018-12-17 DIAGNOSIS — K754 Autoimmune hepatitis: Secondary | ICD-10-CM | POA: Diagnosis present

## 2018-12-17 DIAGNOSIS — E119 Type 2 diabetes mellitus without complications: Secondary | ICD-10-CM | POA: Diagnosis not present

## 2018-12-17 DIAGNOSIS — R1084 Generalized abdominal pain: Secondary | ICD-10-CM | POA: Diagnosis not present

## 2018-12-17 DIAGNOSIS — R778 Other specified abnormalities of plasma proteins: Secondary | ICD-10-CM | POA: Diagnosis present

## 2018-12-17 DIAGNOSIS — Z8711 Personal history of peptic ulcer disease: Secondary | ICD-10-CM

## 2018-12-17 DIAGNOSIS — Z823 Family history of stroke: Secondary | ICD-10-CM

## 2018-12-17 DIAGNOSIS — E890 Postprocedural hypothyroidism: Secondary | ICD-10-CM | POA: Diagnosis present

## 2018-12-17 DIAGNOSIS — R1314 Dysphagia, pharyngoesophageal phase: Secondary | ICD-10-CM | POA: Diagnosis present

## 2018-12-17 DIAGNOSIS — J3801 Paralysis of vocal cords and larynx, unilateral: Secondary | ICD-10-CM | POA: Diagnosis present

## 2018-12-17 DIAGNOSIS — I5033 Acute on chronic diastolic (congestive) heart failure: Secondary | ICD-10-CM | POA: Diagnosis present

## 2018-12-17 DIAGNOSIS — Z79899 Other long term (current) drug therapy: Secondary | ICD-10-CM

## 2018-12-17 DIAGNOSIS — Z820 Family history of epilepsy and other diseases of the nervous system: Secondary | ICD-10-CM

## 2018-12-17 HISTORY — DX: Burn of unspecified body region, unspecified degree: T30.0

## 2018-12-17 LAB — CBC WITH DIFFERENTIAL/PLATELET
Abs Immature Granulocytes: 0.02 10*3/uL (ref 0.00–0.07)
Basophils Absolute: 0 10*3/uL (ref 0.0–0.1)
Basophils Relative: 1 %
Eosinophils Absolute: 0.1 10*3/uL (ref 0.0–0.5)
Eosinophils Relative: 1 %
HCT: 35.9 % — ABNORMAL LOW (ref 36.0–46.0)
Hemoglobin: 11.8 g/dL — ABNORMAL LOW (ref 12.0–15.0)
Immature Granulocytes: 0 %
Lymphocytes Relative: 8 %
Lymphs Abs: 0.5 10*3/uL — ABNORMAL LOW (ref 0.7–4.0)
MCH: 29.2 pg (ref 26.0–34.0)
MCHC: 32.9 g/dL (ref 30.0–36.0)
MCV: 88.9 fL (ref 80.0–100.0)
Monocytes Absolute: 0.5 10*3/uL (ref 0.1–1.0)
Monocytes Relative: 8 %
Neutro Abs: 5.4 10*3/uL (ref 1.7–7.7)
Neutrophils Relative %: 82 %
Platelets: 129 10*3/uL — ABNORMAL LOW (ref 150–400)
RBC: 4.04 MIL/uL (ref 3.87–5.11)
RDW: 13.7 % (ref 11.5–15.5)
WBC: 6.5 10*3/uL (ref 4.0–10.5)
nRBC: 0 % (ref 0.0–0.2)

## 2018-12-17 LAB — COMPREHENSIVE METABOLIC PANEL
ALT: 27 U/L (ref 0–44)
AST: 39 U/L (ref 15–41)
Albumin: 4 g/dL (ref 3.5–5.0)
Alkaline Phosphatase: 77 U/L (ref 38–126)
Anion gap: 12 (ref 5–15)
BUN: 17 mg/dL (ref 8–23)
CO2: 21 mmol/L — ABNORMAL LOW (ref 22–32)
Calcium: 9.8 mg/dL (ref 8.9–10.3)
Chloride: 89 mmol/L — ABNORMAL LOW (ref 98–111)
Creatinine, Ser: 0.74 mg/dL (ref 0.44–1.00)
GFR calc Af Amer: >60 mL/min (ref >60)
GFR calc non Af Amer: >60 mL/min (ref >60)
Glucose, Bld: 136 mg/dL — ABNORMAL HIGH (ref 70–99)
Potassium: 5.1 mmol/L (ref 3.5–5.1)
Sodium: 122 mmol/L — ABNORMAL LOW (ref 135–145)
Total Bilirubin: 0.6 mg/dL (ref 0.3–1.2)
Total Protein: 7.2 g/dL (ref 6.5–8.1)

## 2018-12-17 LAB — LIPASE, BLOOD: Lipase: 41 U/L (ref 11–51)

## 2018-12-17 LAB — BRAIN NATRIURETIC PEPTIDE: B Natriuretic Peptide: 224.2 pg/mL — ABNORMAL HIGH (ref 0.0–100.0)

## 2018-12-17 LAB — SARS CORONAVIRUS 2 BY RT PCR (HOSPITAL ORDER, PERFORMED IN ~~LOC~~ HOSPITAL LAB): SARS Coronavirus 2: NEGATIVE

## 2018-12-17 LAB — TROPONIN I: Troponin I: <0.03 ng/mL (ref <0.03)

## 2018-12-17 MED ORDER — ONDANSETRON HCL 4 MG/2ML IJ SOLN: 4.0000 mg | Freq: Four times a day (QID) | INTRAMUSCULAR | Status: DC | PRN

## 2018-12-17 MED ORDER — ALPRAZOLAM 0.25 MG PO TABS
0.2500 mg | ORAL_TABLET | Freq: Once | ORAL | Status: AC
  Administered 2018-12-17: 0.25 mg via ORAL
  Filled 2018-12-17: qty 1

## 2018-12-17 MED ORDER — INSULIN ASPART 100 UNIT/ML ~~LOC~~ SOLN
0.0000 [IU] | Freq: Three times a day (TID) | SUBCUTANEOUS | Status: DC
  Administered 2018-12-18: 1 [IU] via SUBCUTANEOUS
  Administered 2018-12-18: 3 [IU] via SUBCUTANEOUS
  Administered 2018-12-19 (×2): 1 [IU] via SUBCUTANEOUS

## 2018-12-17 MED ORDER — ALBUTEROL SULFATE HFA 108 (90 BASE) MCG/ACT IN AERS
2.0000 | INHALATION_SPRAY | RESPIRATORY_TRACT | Status: DC | PRN
  Filled 2018-12-17: qty 6.7

## 2018-12-17 MED ORDER — ALPRAZOLAM 0.5 MG PO TABS
0.5000 mg | ORAL_TABLET | Freq: Every day | ORAL | Status: DC | PRN
  Administered 2018-12-19: 0.5 mg via ORAL
  Filled 2018-12-17: qty 1

## 2018-12-17 MED ORDER — FAMOTIDINE IN NACL 20-0.9 MG/50ML-% IV SOLN
20.0000 mg | Freq: Two times a day (BID) | INTRAVENOUS | Status: DC
  Administered 2018-12-18 – 2018-12-19 (×3): 20 mg via INTRAVENOUS
  Filled 2018-12-17 (×3): qty 50

## 2018-12-17 MED ORDER — ALPRAZOLAM 0.5 MG PO TABS
0.5000 mg | ORAL_TABLET | Freq: Every day | ORAL | Status: DC
  Administered 2018-12-18: 0.5 mg via ORAL
  Filled 2018-12-17: qty 1

## 2018-12-17 MED ORDER — AZELASTINE HCL 0.1 % NA SOLN
2.0000 | Freq: Every day | NASAL | Status: DC
  Filled 2018-12-17: qty 30

## 2018-12-17 MED ORDER — PANTOPRAZOLE SODIUM 40 MG PO TBEC
40.0000 mg | DELAYED_RELEASE_TABLET | Freq: Every day | ORAL | Status: DC
  Administered 2018-12-18 – 2018-12-19 (×2): 40 mg via ORAL
  Filled 2018-12-17 (×2): qty 1

## 2018-12-17 MED ORDER — ASPIRIN EC 81 MG PO TBEC
81.0000 mg | DELAYED_RELEASE_TABLET | Freq: Every day | ORAL | Status: DC
  Administered 2018-12-18 – 2018-12-19 (×2): 81 mg via ORAL
  Filled 2018-12-17 (×2): qty 1

## 2018-12-17 MED ORDER — HEPARIN SODIUM (PORCINE) 5000 UNIT/ML IJ SOLN
5000.0000 [IU] | Freq: Three times a day (TID) | INTRAMUSCULAR | Status: DC
  Administered 2018-12-18 – 2018-12-19 (×5): 5000 [IU] via SUBCUTANEOUS
  Filled 2018-12-17 (×5): qty 1

## 2018-12-17 MED ORDER — HYDRALAZINE HCL 20 MG/ML IJ SOLN: 5.0000 mg | INTRAMUSCULAR | Status: DC | PRN

## 2018-12-17 MED ORDER — LEVETIRACETAM 500 MG PO TABS
500.0000 mg | ORAL_TABLET | Freq: Every day | ORAL | Status: DC
  Administered 2018-12-18 – 2018-12-19 (×2): 500 mg via ORAL
  Filled 2018-12-17 (×2): qty 1

## 2018-12-17 MED ORDER — ONDANSETRON HCL 4 MG PO TABS: 4.0000 mg | ORAL_TABLET | Freq: Four times a day (QID) | ORAL | Status: DC | PRN

## 2018-12-17 MED ORDER — AMLODIPINE BESYLATE 10 MG PO TABS
10.0000 mg | ORAL_TABLET | Freq: Every day | ORAL | Status: DC
  Administered 2018-12-18 – 2018-12-19 (×2): 10 mg via ORAL
  Filled 2018-12-17 (×2): qty 1

## 2018-12-17 MED ORDER — IRBESARTAN 150 MG PO TABS: 150.0000 mg | ORAL_TABLET | Freq: Two times a day (BID) | ORAL | Status: DC

## 2018-12-17 MED ORDER — INSULIN ASPART 100 UNIT/ML ~~LOC~~ SOLN: 0.0000 [IU] | Freq: Every day | SUBCUTANEOUS | Status: DC

## 2018-12-17 MED ORDER — ACETAMINOPHEN 650 MG RE SUPP: 650.0000 mg | Freq: Four times a day (QID) | RECTAL | Status: DC | PRN

## 2018-12-17 MED ORDER — DM-GUAIFENESIN ER 30-600 MG PO TB12: 1.0000 | ORAL_TABLET | Freq: Two times a day (BID) | ORAL | Status: DC | PRN

## 2018-12-17 MED ORDER — IOHEXOL 300 MG/ML  SOLN
100.0000 mL | Freq: Once | INTRAMUSCULAR | Status: AC | PRN
  Administered 2018-12-17: 100 mL via INTRAVENOUS

## 2018-12-17 MED ORDER — SODIUM CHLORIDE 0.9 % IV BOLUS
250.0000 mL | Freq: Once | INTRAVENOUS | Status: AC
  Administered 2018-12-18: 250 mL via INTRAVENOUS

## 2018-12-17 MED ORDER — MORPHINE SULFATE (PF) 2 MG/ML IV SOLN
0.5000 mg | INTRAVENOUS | Status: DC | PRN
  Administered 2018-12-18: 0.5 mg via INTRAVENOUS
  Filled 2018-12-17: qty 1

## 2018-12-17 MED ORDER — ROSUVASTATIN CALCIUM 20 MG PO TABS
20.0000 mg | ORAL_TABLET | Freq: Every day | ORAL | Status: DC
  Administered 2018-12-18: 20 mg via ORAL
  Filled 2018-12-17: qty 1

## 2018-12-17 MED ORDER — FENTANYL CITRATE (PF) 100 MCG/2ML IJ SOLN
25.0000 ug | Freq: Once | INTRAMUSCULAR | Status: AC
  Administered 2018-12-17: 25 ug via INTRAVENOUS
  Filled 2018-12-17: qty 2

## 2018-12-17 MED ORDER — LEVOTHYROXINE SODIUM 100 MCG PO TABS
100.0000 ug | ORAL_TABLET | Freq: Every day | ORAL | Status: DC
  Administered 2018-12-18: 100 ug via ORAL
  Filled 2018-12-17: qty 1

## 2018-12-17 MED ORDER — DOXAZOSIN MESYLATE 4 MG PO TABS: 4.0000 mg | ORAL_TABLET | ORAL | Status: DC

## 2018-12-17 MED ORDER — ACETAMINOPHEN 325 MG PO TABS: 650.0000 mg | ORAL_TABLET | Freq: Four times a day (QID) | ORAL | Status: DC | PRN

## 2018-12-17 MED ORDER — SODIUM CHLORIDE 0.9 % IV BOLUS: 500.0000 mL | Freq: Once | INTRAVENOUS | Status: DC

## 2018-12-17 MED ORDER — HYDROCODONE-ACETAMINOPHEN 5-325 MG PO TABS
1.0000 | ORAL_TABLET | Freq: Four times a day (QID) | ORAL | Status: DC | PRN
  Administered 2018-12-18 – 2018-12-19 (×2): 1 via ORAL
  Filled 2018-12-17 (×2): qty 1

## 2018-12-17 MED ORDER — LORAZEPAM 2 MG/ML IJ SOLN: 1.0000 mg | INTRAMUSCULAR | Status: DC | PRN

## 2018-12-17 MED ORDER — FENTANYL CITRATE (PF) 100 MCG/2ML IJ SOLN
50.0000 ug | Freq: Once | INTRAMUSCULAR | Status: AC
  Administered 2018-12-17: 50 ug via INTRAVENOUS
  Filled 2018-12-17: qty 2

## 2018-12-17 NOTE — ED Notes (Signed)
Purple top clotted, need another sent to lab for CBCD.

## 2018-12-17 NOTE — ED Notes (Signed)
EDP at bedside  
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
## 2018-12-17 NOTE — ED Notes (Signed)
Recollected lavender top
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
## 2018-12-17 NOTE — ED Notes (Signed)
Hailey Cobb pts daughter @ [phone]

## 2018-12-17 NOTE — ED Triage Notes (Signed)
Pt stated her abd pain became bad enough to come to the ED as of an hr ago, but originally had an episode of this pain on Tuesday. Feels like a stabbing pain 2 in. above naval (per pt).

## 2018-12-17 NOTE — ED Notes (Signed)
Nurse starting IV, will get 2nd set of blood cultures.
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
## 2018-12-18 ENCOUNTER — Other Ambulatory Visit: Payer: Self-pay

## 2018-12-18 ENCOUNTER — Encounter (HOSPITAL_COMMUNITY): Payer: Self-pay | Admitting: *Deleted

## 2018-12-18 DIAGNOSIS — Z90710 Acquired absence of both cervix and uterus: Secondary | ICD-10-CM | POA: Diagnosis not present

## 2018-12-18 DIAGNOSIS — Z20828 Contact with and (suspected) exposure to other viral communicable diseases: Secondary | ICD-10-CM | POA: Diagnosis present

## 2018-12-18 DIAGNOSIS — I4510 Unspecified right bundle-branch block: Secondary | ICD-10-CM | POA: Diagnosis present

## 2018-12-18 DIAGNOSIS — R778 Other specified abnormalities of plasma proteins: Secondary | ICD-10-CM | POA: Diagnosis present

## 2018-12-18 DIAGNOSIS — R7989 Other specified abnormal findings of blood chemistry: Secondary | ICD-10-CM

## 2018-12-18 DIAGNOSIS — R1013 Epigastric pain: Secondary | ICD-10-CM | POA: Diagnosis not present

## 2018-12-18 DIAGNOSIS — R1314 Dysphagia, pharyngoesophageal phase: Secondary | ICD-10-CM | POA: Diagnosis present

## 2018-12-18 DIAGNOSIS — R109 Unspecified abdominal pain: Secondary | ICD-10-CM | POA: Diagnosis not present

## 2018-12-18 DIAGNOSIS — E785 Hyperlipidemia, unspecified: Secondary | ICD-10-CM | POA: Diagnosis present

## 2018-12-18 DIAGNOSIS — R1010 Upper abdominal pain, unspecified: Secondary | ICD-10-CM | POA: Diagnosis not present

## 2018-12-18 DIAGNOSIS — J3801 Paralysis of vocal cords and larynx, unilateral: Secondary | ICD-10-CM | POA: Diagnosis present

## 2018-12-18 DIAGNOSIS — E871 Hypo-osmolality and hyponatremia: Secondary | ICD-10-CM | POA: Diagnosis not present

## 2018-12-18 DIAGNOSIS — I495 Sick sinus syndrome: Secondary | ICD-10-CM | POA: Diagnosis present

## 2018-12-18 DIAGNOSIS — R569 Unspecified convulsions: Secondary | ICD-10-CM | POA: Diagnosis present

## 2018-12-18 DIAGNOSIS — I110 Hypertensive heart disease with heart failure: Secondary | ICD-10-CM | POA: Diagnosis present

## 2018-12-18 DIAGNOSIS — Z8711 Personal history of peptic ulcer disease: Secondary | ICD-10-CM | POA: Diagnosis not present

## 2018-12-18 DIAGNOSIS — I10 Essential (primary) hypertension: Secondary | ICD-10-CM | POA: Diagnosis not present

## 2018-12-18 DIAGNOSIS — E890 Postprocedural hypothyroidism: Secondary | ICD-10-CM | POA: Diagnosis present

## 2018-12-18 DIAGNOSIS — I5032 Chronic diastolic (congestive) heart failure: Secondary | ICD-10-CM | POA: Diagnosis present

## 2018-12-18 DIAGNOSIS — J9811 Atelectasis: Secondary | ICD-10-CM | POA: Diagnosis present

## 2018-12-18 DIAGNOSIS — Z9841 Cataract extraction status, right eye: Secondary | ICD-10-CM | POA: Diagnosis not present

## 2018-12-18 DIAGNOSIS — K219 Gastro-esophageal reflux disease without esophagitis: Secondary | ICD-10-CM | POA: Diagnosis present

## 2018-12-18 DIAGNOSIS — Z961 Presence of intraocular lens: Secondary | ICD-10-CM | POA: Diagnosis present

## 2018-12-18 DIAGNOSIS — I493 Ventricular premature depolarization: Secondary | ICD-10-CM | POA: Diagnosis not present

## 2018-12-18 DIAGNOSIS — K589 Irritable bowel syndrome without diarrhea: Secondary | ICD-10-CM | POA: Diagnosis present

## 2018-12-18 DIAGNOSIS — E7800 Pure hypercholesterolemia, unspecified: Secondary | ICD-10-CM | POA: Diagnosis present

## 2018-12-18 DIAGNOSIS — F419 Anxiety disorder, unspecified: Secondary | ICD-10-CM | POA: Diagnosis present

## 2018-12-18 DIAGNOSIS — I350 Nonrheumatic aortic (valve) stenosis: Secondary | ICD-10-CM | POA: Diagnosis present

## 2018-12-18 DIAGNOSIS — E119 Type 2 diabetes mellitus without complications: Secondary | ICD-10-CM | POA: Diagnosis not present

## 2018-12-18 DIAGNOSIS — K5730 Diverticulosis of large intestine without perforation or abscess without bleeding: Secondary | ICD-10-CM | POA: Diagnosis present

## 2018-12-18 DIAGNOSIS — K754 Autoimmune hepatitis: Secondary | ICD-10-CM | POA: Diagnosis present

## 2018-12-18 LAB — BASIC METABOLIC PANEL
Anion gap: 12 (ref 5–15)
BUN: 12 mg/dL (ref 8–23)
CO2: 22 mmol/L (ref 22–32)
Calcium: 9.5 mg/dL (ref 8.9–10.3)
Chloride: 98 mmol/L (ref 98–111)
Creatinine, Ser: 0.6 mg/dL (ref 0.44–1.00)
GFR calc Af Amer: >60 mL/min (ref >60)
GFR calc non Af Amer: >60 mL/min (ref >60)
Glucose, Bld: 107 mg/dL — ABNORMAL HIGH (ref 70–99)
Potassium: 4.7 mmol/L (ref 3.5–5.1)
Sodium: 132 mmol/L — ABNORMAL LOW (ref 135–145)

## 2018-12-18 LAB — CBC
HCT: 34.9 % — ABNORMAL LOW (ref 36.0–46.0)
Hemoglobin: 11.7 g/dL — ABNORMAL LOW (ref 12.0–15.0)
MCH: 28.9 pg (ref 26.0–34.0)
MCHC: 33.5 g/dL (ref 30.0–36.0)
MCV: 86.2 fL (ref 80.0–100.0)
Platelets: 123 10*3/uL — ABNORMAL LOW (ref 150–400)
RBC: 4.05 MIL/uL (ref 3.87–5.11)
RDW: 13.6 % (ref 11.5–15.5)
WBC: 5 10*3/uL (ref 4.0–10.5)
nRBC: 0 % (ref 0.0–0.2)

## 2018-12-18 LAB — TROPONIN I
Troponin I: 0.07 ng/mL (ref <0.03)
Troponin I: 0.06 ng/mL (ref <0.03)
Troponin I: 0.06 ng/mL (ref <0.03)
Troponin I: 0.03 ng/mL (ref <0.03)

## 2018-12-18 LAB — CBG MONITORING, ED: Glucose-Capillary: 137 mg/dL — ABNORMAL HIGH (ref 70–99)

## 2018-12-18 LAB — GLUCOSE, CAPILLARY
Glucose-Capillary: 110 mg/dL — ABNORMAL HIGH (ref 70–99)
Glucose-Capillary: 220 mg/dL — ABNORMAL HIGH (ref 70–99)
Glucose-Capillary: 122 mg/dL — ABNORMAL HIGH (ref 70–99)
Glucose-Capillary: 147 mg/dL — ABNORMAL HIGH (ref 70–99)

## 2018-12-18 LAB — TSH: TSH: 11.154 u[IU]/mL — ABNORMAL HIGH (ref 0.350–4.500)

## 2018-12-18 MED ORDER — NITROGLYCERIN 0.4 MG SL SUBL: 0.4000 mg | SUBLINGUAL_TABLET | SUBLINGUAL | Status: DC | PRN

## 2018-12-18 MED ORDER — DOXAZOSIN MESYLATE 2 MG PO TABS
8.0000 mg | ORAL_TABLET | Freq: Every evening | ORAL | Status: DC
  Administered 2018-12-18: 8 mg via ORAL
  Filled 2018-12-18: qty 4

## 2018-12-18 MED ORDER — DOXAZOSIN MESYLATE 2 MG PO TABS
4.0000 mg | ORAL_TABLET | Freq: Every day | ORAL | Status: DC
  Administered 2018-12-18 – 2018-12-19 (×2): 4 mg via ORAL
  Filled 2018-12-18 (×2): qty 2

## 2018-12-18 MED ORDER — IRBESARTAN 75 MG PO TABS
150.0000 mg | ORAL_TABLET | Freq: Two times a day (BID) | ORAL | Status: DC
  Administered 2018-12-18 – 2018-12-19 (×3): 150 mg via ORAL
  Filled 2018-12-18 (×3): qty 2

## 2018-12-18 MED ORDER — LEVOTHYROXINE SODIUM 112 MCG PO TABS
112.0000 ug | ORAL_TABLET | Freq: Every day | ORAL | Status: DC
  Administered 2018-12-19: 112 ug via ORAL
  Filled 2018-12-18: qty 1

## 2018-12-18 MED ORDER — FUROSEMIDE 20 MG PO TABS
20.0000 mg | ORAL_TABLET | Freq: Every day | ORAL | Status: DC
  Administered 2018-12-18 – 2018-12-19 (×2): 20 mg via ORAL
  Filled 2018-12-18 (×2): qty 1

## 2018-12-18 NOTE — Consult Note (Signed)
Error

## 2018-12-18 NOTE — Care Management Obs Status (Signed)
[place] NOTIFICATION   Patient Details  Name: Hailey Cobb MRN: [id number] Date of Birth: 1931/11/16   Medicare Observation Status Notification Given:  Yes    Oretha Milch, LCSW 12/18/2018, 1:49 PM
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
## 2018-12-18 NOTE — Progress Notes (Signed)
PROGRESS NOTE    Hailey Cobb  XIP:[id number] DOB: [birth year] DOA: 12/17/2018 PCP: Hulan Fess, MD    Brief Narrative:  85 year old female with history of hypertension, hyperlipidemia, type 2 diabetes, GERD, hypothyroidism, autoimmune hepatitis and tachybradycardia syndrome, history of seizure with subdural hematoma, vocal cord paralysis status post surgery, GERD, esophageal dysphagia presenting to the emergency room with about 1 week of mild intermittent abdominal pain that was worsened on the day of admission.  She also had some issues with constipation that was corrected with laxatives.  Patient also had developed some cough congestion and occasional wheezing.  She does have history of esophageal dysphagia status post dilatation.  He also has history of frequent PVCs in the past. In the emergency room COVID-19 was negative, WBC count was normal.  Sodium was 122.  Lipase was normal.  CT abdomen pelvis was done did not show any acute findings.   Assessment & Plan:   Principal Problem:   Abdominal pain Active Problems:   Anxiety   Hypothyroidism   Essential hypertension   Diabetes mellitus without complication (HCC)   Tachycardia-bradycardia syndrome (HCC)   HLD (hyperlipidemia)   GERD (gastroesophageal reflux disease)   Seizure (HCC)   Chronic diastolic CHF (congestive heart failure) (HCC)   Hyponatremia   Elevated troponin  Abdominal pain: Etiology unclear.  Does have history of IBS as well as esophageal disorder.  Currently with some clinical improvement.  She is hungry and willing to try food.  Advance diet from clear liquid to soft diet.  Continue Protonix. If continues to have pain, will discuss with [facility] for possible upper EGD. Symptomatic treatment with pain medications.  Continue laxatives.  Hyponatremia: Sodium was 122.  Probably due to low solute intake.  Treated with 250 mL of normal saline.  Her sodium has improved and stabilized to 131.  We will continue to  monitor.  Hypothyroidism: TSH is 11.  Will increase her thyroxine from 100-112 mcg.  She will need recheck in about a month.  Chronic diastolic congestive heart failure: With trace pulmonary edema.  Will resume her home dose of Lasix.  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  Apparently chronic findings.  Not a candidate for rate control medications due to severe bradycardia in the past.  Will be seen by cardiology.  Type 2 diabetes on oral hypoglycemics at home: Holding metformin.  He stays on sliding scale insulin.  Seizure disorders: Patient on Keppra with no new seizures.   DVT prophylaxis: Heparin subcu Code Status: Full code Family Communication: Daughter on the phone Disposition Plan: Continue to monitor in the hospital   Consultants:   Cardiology  Procedures:   None  Antimicrobials:   None   Subjective: Patient was seen and examined.  She still has mild abdominal pain but denies any nausea vomiting.  She wants to try food.  Overnight events noted.  She had frequent PVCs on the telemetry otherwise no other acute events.  Feels some cough and congestion.  Afebrile. COVID-19 negative.  Objective: Vitals:   12/18/18 0139 12/18/18 0537 12/18/18 0541 12/18/18 0850  BP:  139/63  115/72  Pulse:  (!) 37 71 89  Resp:  (!) 22  18  Temp:  97.9 F (36.6 C)  97.9 F (36.6 C)  TempSrc:  Oral  Oral  SpO2:  97%  100%  Weight: 89.9 kg     Height:        Intake/Output Summary (Last 24 hours) at 12/18/2018 1140  Last data filed at 12/18/2018 0921 Gross per 24 hour  Intake 370 ml  Output 500 ml  Net -130 ml   Filed Weights   12/17/18 1752 12/18/18 0139  Weight: 86.2 kg 89.9 kg    Examination:  General exam: Appears calm and comfortable, on 1 L of oxygen. Respiratory system: Clear to auscultation.  Occasional expiratory wheezes.  Respiratory effort normal. Cardiovascular system: S1 & S2 heard, RRR. No JVD, murmurs, rubs, gallops or clicks. No  pedal edema. Gastrointestinal system: Abdomen is nondistended, soft and nontender. No organomegaly or masses felt. Normal bowel sounds heard. Central nervous system: Alert and oriented. No focal neurological deficits. Extremities: Symmetric 5 x 5 power. Skin: No rashes, lesions or ulcers Psychiatry: Judgement and insight appear normal. Mood & affect appropriate.     Data Reviewed: I have personally reviewed following labs and imaging studies  CBC: Recent Labs  Lab 12/17/18 1948 12/18/18 0339  WBC 6.5 5.0  NEUTROABS 5.4  --   HGB 11.8* 11.7*  HCT 35.9* 34.9*  MCV 88.9 86.2  PLT 129* 314*   Basic Metabolic Panel: Recent Labs  Lab 12/17/18 1759 12/18/18 0339  NA 122* 132*  K 5.1 4.7  CL 89* 98  CO2 21* 22  GLUCOSE 136* 107*  BUN 17 12  CREATININE 0.74 0.60  CALCIUM 9.8 9.5   GFR: Estimated Creatinine Clearance: 56.9 mL/min (by C-G formula based on SCr of 0.6 mg/dL). Liver Function Tests: Recent Labs  Lab 12/17/18 1759  AST 39  ALT 27  ALKPHOS 77  BILITOT 0.6  PROT 7.2  ALBUMIN 4.0   Recent Labs  Lab 12/17/18 1759  LIPASE 41   No results for input(s): AMMONIA in the last 168 hours. Coagulation Profile: No results for input(s): INR, PROTIME in the last 168 hours. Cardiac Enzymes: Recent Labs  Lab 12/17/18 1759 12/18/18 0339 12/18/18 0526  TROPONINI <0.03 0.07* 0.06*   BNP (last 3 results) No results for input(s): PROBNP in the last 8760 hours. HbA1C: No results for input(s): HGBA1C in the last 72 hours. CBG: Recent Labs  Lab 12/18/18 0027 12/18/18 0623 12/18/18 1122  GLUCAP 137* 110* 220*   Lipid Profile: No results for input(s): CHOL, HDL, LDLCALC, TRIG, CHOLHDL, LDLDIRECT in the last 72 hours. Thyroid Function Tests: Recent Labs    12/18/18 0339  TSH 11.154*   Anemia Panel: No results for input(s): VITAMINB12, FOLATE, FERRITIN, TIBC, IRON, RETICCTPCT in the last 72 hours. Sepsis Labs: No results for input(s): PROCALCITON,  LATICACIDVEN in the last 168 hours.  Recent Results (from the past 240 hour(s))  SARS Coronavirus 2 [facility] order, Performed in [place] hospital lab)     Status: None   Collection Time: 12/17/18  6:16 PM  Result Value Ref Range Status   SARS Coronavirus 2 NEGATIVE NEGATIVE Final    Comment: (NOTE) If result is NEGATIVE SARS-CoV-2 target nucleic acids are NOT DETECTED. The SARS-CoV-2 RNA is generally detectable in upper and lower  respiratory specimens during the acute phase of infection. The lowest  concentration of SARS-CoV-2 viral copies this assay can detect is 250  copies / mL. A negative result does not preclude SARS-CoV-2 infection  and should not be used as the sole basis for treatment or other  patient management decisions.  A negative result may occur with  improper specimen collection / handling, submission of specimen other  than nasopharyngeal swab, presence of viral mutation(s) within the  areas targeted by this assay, and inadequate number of viral  copies  (<250 copies / mL). A negative result must be combined with clinical  observations, patient history, and epidemiological information. If result is POSITIVE SARS-CoV-2 target nucleic acids are DETECTED. The SARS-CoV-2 RNA is generally detectable in upper and lower  respiratory specimens dur ing the acute phase of infection.  Positive  results are indicative of active infection with SARS-CoV-2.  Clinical  correlation with patient history and other diagnostic information is  necessary to determine patient infection status.  Positive results do  not rule out bacterial infection or co-infection with other viruses. If result is PRESUMPTIVE POSTIVE SARS-CoV-2 nucleic acids MAY BE PRESENT.   A presumptive positive result was obtained on the submitted specimen  and confirmed on repeat testing.  While 2019 novel coronavirus  (SARS-CoV-2) nucleic acids may be present in the submitted sample  additional confirmatory  testing may be necessary for epidemiological  and / or clinical management purposes  to differentiate between  SARS-CoV-2 and other Sarbecovirus currently known to infect humans.  If clinically indicated additional testing with an alternate test  methodology [phone]) is advised. The SARS-CoV-2 RNA is generally  detectable in upper and lower respiratory sp ecimens during the acute  phase of infection. The expected result is Negative. Fact Sheet for Patients:  StrictlyIdeas.no Fact Sheet for Healthcare Providers: BankingDealers.co.za This test is not yet approved or cleared by the [place] FDA and has been authorized for detection and/or diagnosis of SARS-CoV-2 by FDA under an Emergency Use Authorization (EUA).  This EUA will remain in effect (meaning this test can be used) for the duration of the COVID-19 declaration under Section 564(b)(1) of the Act, 21 U.S.C. section 360bbb-3(b)(1), unless the authorization is terminated or revoked sooner. Performed at [facility], [address], [place], [place] [address]   Blood Culture (routine x 2)     Status: None (Preliminary result)   Collection Time: 12/17/18  6:52 PM  Result Value Ref Range Status   Specimen Description BLOOD LEFT ARM  Final   Special Requests   Final    BOTTLES DRAWN AEROBIC AND ANAEROBIC Blood Culture adequate volume   Culture   Final    NO GROWTH < 12 HOURS Performed at [facility], [place] [address], [place], [place] [address]    Report Status PENDING  Incomplete  Blood Culture (routine x 2)     Status: None (Preliminary result)   Collection Time: 12/17/18  7:44 PM  Result Value Ref Range Status   Specimen Description BLOOD RIGHT HAND  Final   Special Requests   Final    BOTTLES DRAWN AEROBIC AND ANAEROBIC Blood Culture adequate volume   Culture   Final    NO GROWTH < 12 HOURS Performed at [facility], [place] [address], [place], [place]  [address]    Report Status PENDING  Incomplete         Radiology Studies: Ct Abdomen Pelvis W Contrast  Result Date: 12/17/2018 CLINICAL DATA:  Generalized abdominal pain EXAM: CT ABDOMEN AND PELVIS WITH CONTRAST TECHNIQUE: Multidetector CT imaging of the abdomen and pelvis was performed using the standard protocol following bolus administration of intravenous contrast. CONTRAST:  189mL OMNIPAQUE IOHEXOL 300 MG/ML  SOLN COMPARISON:  07/05/2018 FINDINGS: Lower chest: Small bilateral pleural effusions with dependent atelectasis in the lower lobes. Heart is borderline in size. Hepatobiliary: No focal hepatic abnormality. Gallbladder unremarkable. Pancreas: No focal abnormality or ductal dilatation. Spleen: No focal abnormality.  Normal size. Adrenals/Urinary Tract: Mild fullness  of the right renal pelvis, stable since prior study, possibly extrarenal pelvis. No stones or overt hydronephrosis. Urinary bladder and adrenal glands unremarkable. Stomach/Bowel: Stomach, large and small bowel grossly unremarkable. Few scattered sigmoid diverticula. Vascular/Lymphatic: Aortic atherosclerosis. No enlarged abdominal or pelvic lymph nodes. Reproductive: Prior hysterectomy.  No adnexal masses. Other: No free fluid or free air. Musculoskeletal: No acute bony abnormality. Degenerative changes in the lumbar spine. IMPRESSION: Small bilateral pleural effusions with bibasilar atelectasis. Scattered sigmoid diverticula.  No active diverticulitis. Aortic atherosclerosis. No acute findings in the abdomen or pelvis. Electronically Signed   By: Rolm Baptise M.D.   On: 12/17/2018 21:03   Dg Chest Portable 1 View  Result Date: 12/17/2018 CLINICAL DATA:  Shortness of breath EXAM: PORTABLE CHEST 1 VIEW COMPARISON:  August 24, 2018 FINDINGS: Stable mild cardiomegaly. The hila and mediastinum are stable. No pneumothorax. Increasing interstitial markings in the lungs, particularly the bases, worsened in the interval. IMPRESSION: Findings  are most consistent with mild cardiomegaly and worsening pulmonary edema. Atypical infection could have a similar appearance. Electronically Signed   By: Dorise Bullion III M.D   On: 12/17/2018 19:09        Scheduled Meds: . ALPRAZolam  0.5 mg Oral QHS  . amLODipine  10 mg Oral Daily  . aspirin EC  81 mg Oral Daily  . azelastine  2 spray Each Nare QHS  . doxazosin  4 mg Oral Daily  . doxazosin  8 mg Oral QPM  . heparin  5,000 Units Subcutaneous Q8H  . insulin aspart  0-5 Units Subcutaneous QHS  . insulin aspart  0-9 Units Subcutaneous TID WC  . irbesartan  150 mg Oral BID  . levETIRAcetam  500 mg Oral Daily  . [START ON 12/19/2018] levothyroxine  112 mcg Oral QAC breakfast  . pantoprazole  40 mg Oral Daily  . rosuvastatin  20 mg Oral QHS   Continuous Infusions: . famotidine (PEPCID) IV 20 mg (12/18/18 0914)     LOS: 0 days    Time spent: 25 minutes    Barb Merino, MD [facility] Pager [phone]  If 7PM-7AM, please contact night-coverage [email or web address] Password [email or web address] 12/18/2018, 11:40 AM

## 2018-12-19 DIAGNOSIS — R1010 Upper abdominal pain, unspecified: Secondary | ICD-10-CM

## 2018-12-19 LAB — GLUCOSE, CAPILLARY
Glucose-Capillary: 122 mg/dL — ABNORMAL HIGH (ref 70–99)
Glucose-Capillary: 146 mg/dL — ABNORMAL HIGH (ref 70–99)
Glucose-Capillary: 98 mg/dL (ref 70–99)

## 2018-12-19 LAB — BASIC METABOLIC PANEL
Anion gap: 7 (ref 5–15)
BUN: 14 mg/dL (ref 8–23)
CO2: 26 mmol/L (ref 22–32)
Calcium: 9.4 mg/dL (ref 8.9–10.3)
Chloride: 98 mmol/L (ref 98–111)
Creatinine, Ser: 0.88 mg/dL (ref 0.44–1.00)
GFR calc Af Amer: >60 mL/min (ref >60)
GFR calc non Af Amer: >60 mL/min (ref >60)
Glucose, Bld: 139 mg/dL — ABNORMAL HIGH (ref 70–99)
Potassium: 4 mmol/L (ref 3.5–5.1)
Sodium: 131 mmol/L — ABNORMAL LOW (ref 135–145)

## 2018-12-19 LAB — LIPID PANEL
Cholesterol: 144 mg/dL (ref 0–200)
HDL: 51 mg/dL (ref >40)
LDL Cholesterol: 72 mg/dL (ref 0–99)
Total CHOL/HDL Ratio: 2.8 RATIO
Triglycerides: 103 mg/dL (ref <150)
VLDL: 21 mg/dL (ref 0–40)

## 2018-12-19 LAB — HEMOGLOBIN A1C
Hgb A1c MFr Bld: 6.3 % — ABNORMAL HIGH (ref 4.8–5.6)
Mean Plasma Glucose: 134.11 mg/dL

## 2018-12-19 MED ORDER — LEVOTHYROXINE SODIUM 125 MCG PO TABS: 125.0000 ug | ORAL_TABLET | Freq: Every day | ORAL | 2 refills | Status: DC

## 2018-12-19 MED ORDER — ESOMEPRAZOLE MAGNESIUM 40 MG PO CPDR: 40.0000 mg | DELAYED_RELEASE_CAPSULE | Freq: Two times a day (BID) | ORAL | 0 refills | Status: DC

## 2018-12-19 NOTE — Progress Notes (Signed)
DISCHARGE NOTE HOME Hailey Cobb to be discharged Home per MD order. Discussed prescriptions and follow up appointments with the patient. Prescriptions given to patient; medication list explained in detail. Patient verbalized understanding.  Skin clean, dry and intact without evidence of skin break down, no evidence of skin tears noted. IV catheter discontinued intact. Site without signs and symptoms of complications. Dressing and pressure applied. Pt denies pain at the site currently. No complaints noted.  Patient free of lines, drains, and wounds.   An After Visit Summary (AVS) was printed and given to the patient. Patient escorted via wheelchair, and discharged home via private auto.  [name], RN

## 2018-12-19 NOTE — Progress Notes (Signed)
Received phone call from patient about having received Medicare observation notice. Advised patient that she is currently inpatient. Discussed with patient the difference between observation and inpatient. Patient expressed appreciation.   Manya Silvas, RN CM Transitions of Care 17M CM [phone]

## 2018-12-19 NOTE — Discharge Summary (Signed)
Physician Discharge Summary  Hailey Cobb JSE:[id number] DOB: February 18, 1932 DOA: 12/17/2018  PCP: Hulan Fess, MD  Admit date: 12/17/2018 Discharge date: 12/19/2018  Admitted From: home  Disposition:  Home   Recommendations for Outpatient Follow-up:  1. Follow up with PCP in 1-2 weeks   Home Health:N/A  Equipment/Devices:N/A   Discharge Condition:stable   CODE STATUS:full code  Diet recommendation: cardiac diet   Brief/Interim Summary: 85 year old female with history of hypertension, hyperlipidemia, type 2 diabetes, GERD, hypothyroidism, autoimmune hepatitis and tachybradycardia syndrome, history of seizure with subdural hematoma, vocal cord paralysis status post surgery, GERD, esophageal dysphagia presenting to the emergency room with about 1 week of mild intermittent abdominal pain that was worsened on the day of admission.  She also had some issues with constipation that was corrected with laxatives.  Patient also had developed some cough congestion and occasional wheezing.  She does have history of esophageal dysphagia status post dilatation.  He also has history of frequent PVCs in the past. In the emergency room COVID-19 was negative, WBC count was normal.  Sodium was 122.  Lipase was normal.  CT abdomen pelvis was done did not show any acute findings.  Discharge Diagnoses:  Principal Problem:   Abdominal pain Active Problems:   Anxiety   Hypothyroidism   Essential hypertension   Diabetes mellitus without complication (HCC)   Tachycardia-bradycardia syndrome (HCC)   HLD (hyperlipidemia)   GERD (gastroesophageal reflux disease)   Seizure (HCC)   Chronic diastolic CHF (congestive heart failure) (HCC)   Hyponatremia   Elevated troponin  Abdominal epigastric pain: With history of IBS and peptic ulcer disease as well as esophageal disorder.  She was treated with increasing dose of PPI with good clinical response. Patient had good clinical response with twice daily PPI.  Will  discharge patient on Nexium, changed to twice daily. If continues to have symptoms, she will need EGD.  She will follow-up with [facility].  Hyponatremia: Probably due to low solute intake.  Has been normalizing.  Sodium is 131.  Hypothyroidism: TSH is 11.  Will increase her dose of thyroxine from 132mcg to 125 mcg.  Frequent PVCs in telemetry unit with history of tachybradycardia syndrome: Seen by cardiology.  Recommended expectant management.  No rate controlling medications.  Type 2 diabetes and seizure disorders: Chronic and stable.  Discharge Instructions  Discharge Instructions    Call MD for:  persistant nausea and vomiting   Complete by:  As directed    Call MD for:  severe uncontrolled pain   Complete by:  As directed    Diet - low sodium heart healthy   Complete by:  As directed    Discharge instructions   Complete by:  As directed    Call your gastro doctor if keep having pain .   Increase activity slowly   Complete by:  As directed      Allergies as of 12/19/2018      Reactions   Metoprolol    Bradycardia   Clonidine Derivatives Other (See Comments)   Dizzy   Glimepiride Other (See Comments)   Hypoglycemia   Invokana [canagliflozin] Other (See Comments)   Weakness, perineal irritation      Medication List    TAKE these medications   ALPRAZolam 0.5 MG tablet Commonly known as:  XANAX Take 0.5 mg by mouth See admin instructions. Take 0.5 mg by mouth daily at bedtime, may also take  0.5 mg during the day as needed for anxiety.  amLODipine 10 MG tablet Commonly known as:  NORVASC Take 10 mg by mouth daily.   aspirin EC 81 MG tablet Take 81 mg by mouth daily.   Astepro 0.15 % Soln Generic drug:  Azelastine HCl Place 2 sprays into the nose at bedtime.   doxazosin 2 MG tablet Commonly known as:  CARDURA Take 4-8 mg by mouth See admin instructions. Take two  tablets (4 mg) by mouth each morning and four tablets (8 mg) by mouth each  evening.   esomeprazole 40 MG capsule Commonly known as:  NEXIUM Take 1 capsule (40 mg total) by mouth 2 (two) times daily before a meal for 30 days. What changed:  when to take this   furosemide 40 MG tablet Commonly known as:  LASIX Take 20 mg by mouth daily.   HYDROcodone-acetaminophen 5-325 MG tablet Commonly known as:  Norco Take 0.5-1 tablets by mouth See admin instructions. Take 1 tablet every night at bedtime, can take one during the day as needed for pain What changed:  additional instructions   irbesartan 150 MG tablet Commonly known as:  AVAPRO Take 150 mg by mouth 2 (two) times daily.   levETIRAcetam 500 MG tablet Commonly known as:  KEPPRA Take 500 mg by mouth daily.   levothyroxine 125 MCG tablet Commonly known as:  SYNTHROID Take 1 tablet (125 mcg total) by mouth daily before breakfast for 30 days. What changed:    medication strength  how much to take   metFORMIN 1000 MG tablet Commonly known as:  GLUCOPHAGE Take 1,000 mg by mouth 2 (two) times daily with a meal.   OVER THE COUNTER MEDICATION Take 3 tablets by mouth daily. Thymic factor vitamins   rosuvastatin 20 MG tablet Commonly known as:  CRESTOR Take 20 mg by mouth See admin instructions. Three times a week   sitaGLIPtin 100 MG tablet Commonly known as:  JANUVIA Take 100 mg by mouth daily.       Allergies  Allergen Reactions  . Metoprolol     Bradycardia   . Clonidine Derivatives Other (See Comments)    Dizzy  . Glimepiride Other (See Comments)    Hypoglycemia  . Invokana [Canagliflozin] Other (See Comments)    Weakness, perineal irritation    Consultations: Cardiology    Procedures/Studies: Ct Abdomen Pelvis W Contrast  Result Date: 12/17/2018 CLINICAL DATA:  Generalized abdominal pain EXAM: CT ABDOMEN AND PELVIS WITH CONTRAST TECHNIQUE: Multidetector CT imaging of the abdomen and pelvis was performed using the standard protocol following bolus administration of intravenous  contrast. CONTRAST:  144mL OMNIPAQUE IOHEXOL 300 MG/ML  SOLN COMPARISON:  07/05/2018 FINDINGS: Lower chest: Small bilateral pleural effusions with dependent atelectasis in the lower lobes. Heart is borderline in size. Hepatobiliary: No focal hepatic abnormality. Gallbladder unremarkable. Pancreas: No focal abnormality or ductal dilatation. Spleen: No focal abnormality.  Normal size. Adrenals/Urinary Tract: Mild fullness of the right renal pelvis, stable since prior study, possibly extrarenal pelvis. No stones or overt hydronephrosis. Urinary bladder and adrenal glands unremarkable. Stomach/Bowel: Stomach, large and small bowel grossly unremarkable. Few scattered sigmoid diverticula. Vascular/Lymphatic: Aortic atherosclerosis. No enlarged abdominal or pelvic lymph nodes. Reproductive: Prior hysterectomy.  No adnexal masses. Other: No free fluid or free air. Musculoskeletal: No acute bony abnormality. Degenerative changes in the lumbar spine. IMPRESSION: Small bilateral pleural effusions with bibasilar atelectasis. Scattered sigmoid diverticula.  No active diverticulitis. Aortic atherosclerosis. No acute findings in the abdomen or pelvis. Electronically Signed   By: Rolm Baptise M.D.  On: 12/17/2018 21:03   Dg Chest Portable 1 View  Result Date: 12/17/2018 CLINICAL DATA:  Shortness of breath EXAM: PORTABLE CHEST 1 VIEW COMPARISON:  August 24, 2018 FINDINGS: Stable mild cardiomegaly. The hila and mediastinum are stable. No pneumothorax. Increasing interstitial markings in the lungs, particularly the bases, worsened in the interval. IMPRESSION: Findings are most consistent with mild cardiomegaly and worsening pulmonary edema. Atypical infection could have a similar appearance. Electronically Signed   By: Dorise Bullion III M.D   On: 12/17/2018 19:09     Subjective: Patient was seen and examined on the day of discharge.  Also discussed with patient's daughter over the phone.  She is still has occasional  abdominal pain but was able to eat regular diet.  Congestion and shortness of breath improved.  Walked in the hallway with no additional oxygen needed.   Discharge Exam: Vitals:   12/19/18 0502 12/19/18 0840  BP: (!) 117/53 128/64  Pulse: (!) 43 63  Resp: 16 20  Temp: 97.6 F (36.4 C) 97.6 F (36.4 C)  SpO2: 92% 97%   Vitals:   12/18/18 0850 12/18/18 2048 12/19/18 0502 12/19/18 0840  BP: 115/72 (!) 114/52 (!) 117/53 128/64  Pulse: 89 62 (!) 43 63  Resp: 18 18 16 20   Temp: 97.9 F (36.6 C) 98.1 F (36.7 C) 97.6 F (36.4 C) 97.6 F (36.4 C)  TempSrc: Oral Oral Oral Oral  SpO2: 100% 96% 92% 97%  Weight:  90.2 kg    Height:        General: Pt is alert, awake, not in acute distress Cardiovascular: RRR, S1/S2 +, no rubs, no gallops Respiratory: CTA bilaterally, no wheezing, no rhonchi Abdominal: Soft, NT, ND, bowel sounds + Extremities: no edema, no cyanosis    The results of significant diagnostics from this hospitalization (including imaging, microbiology, ancillary and laboratory) are listed below for reference.     Microbiology: Recent Results (from the past 240 hour(s))  SARS Coronavirus 2 [facility] order, Performed in [place] hospital lab)     Status: None   Collection Time: 12/17/18  6:16 PM  Result Value Ref Range Status   SARS Coronavirus 2 NEGATIVE NEGATIVE Final    Comment: (NOTE) If result is NEGATIVE SARS-CoV-2 target nucleic acids are NOT DETECTED. The SARS-CoV-2 RNA is generally detectable in upper and lower  respiratory specimens during the acute phase of infection. The lowest  concentration of SARS-CoV-2 viral copies this assay can detect is 250  copies / mL. A negative result does not preclude SARS-CoV-2 infection  and should not be used as the sole basis for treatment or other  patient management decisions.  A negative result may occur with  improper specimen collection / handling, submission of specimen other  than nasopharyngeal swab,  presence of viral mutation(s) within the  areas targeted by this assay, and inadequate number of viral copies  (<250 copies / mL). A negative result must be combined with clinical  observations, patient history, and epidemiological information. If result is POSITIVE SARS-CoV-2 target nucleic acids are DETECTED. The SARS-CoV-2 RNA is generally detectable in upper and lower  respiratory specimens dur ing the acute phase of infection.  Positive  results are indicative of active infection with SARS-CoV-2.  Clinical  correlation with patient history and other diagnostic information is  necessary to determine patient infection status.  Positive results do  not rule out bacterial infection or co-infection with other viruses. If result is PRESUMPTIVE POSTIVE SARS-CoV-2 nucleic acids MAY BE PRESENT.  A presumptive positive result was obtained on the submitted specimen  and confirmed on repeat testing.  While 2019 novel coronavirus  (SARS-CoV-2) nucleic acids may be present in the submitted sample  additional confirmatory testing may be necessary for epidemiological  and / or clinical management purposes  to differentiate between  SARS-CoV-2 and other Sarbecovirus currently known to infect humans.  If clinically indicated additional testing with an alternate test  methodology [phone]) is advised. The SARS-CoV-2 RNA is generally  detectable in upper and lower respiratory sp ecimens during the acute  phase of infection. The expected result is Negative. Fact Sheet for Patients:  StrictlyIdeas.no Fact Sheet for Healthcare Providers: BankingDealers.co.za This test is not yet approved or cleared by the [place] FDA and has been authorized for detection and/or diagnosis of SARS-CoV-2 by FDA under an Emergency Use Authorization (EUA).  This EUA will remain in effect (meaning this test can be used) for the duration of the COVID-19 declaration under  Section 564(b)(1) of the Act, 21 U.S.C. section 360bbb-3(b)(1), unless the authorization is terminated or revoked sooner. Performed at [facility], [place] [address] [place], [place] [address]   Blood Culture (routine x 2)     Status: None (Preliminary result)   Collection Time: 12/17/18  6:52 PM  Result Value Ref Range Status   Specimen Description BLOOD LEFT ARM  Final   Special Requests   Final    BOTTLES DRAWN AEROBIC AND ANAEROBIC Blood Culture adequate volume   Culture   Final    NO GROWTH 2 DAYS Performed at [facility], [address], [place], [place] [address]    Report Status PENDING  Incomplete  Blood Culture (routine x 2)     Status: None (Preliminary result)   Collection Time: 12/17/18  7:44 PM  Result Value Ref Range Status   Specimen Description BLOOD RIGHT HAND  Final   Special Requests   Final    BOTTLES DRAWN AEROBIC AND ANAEROBIC Blood Culture adequate volume   Culture   Final    NO GROWTH 2 DAYS Performed at [facility], [place] [address] [place], [place] [address]    Report Status PENDING  Incomplete     Labs: BNP (last 3 results) Recent Labs    12/17/18 1948  BNP 384.5*   Basic Metabolic Panel: Recent Labs  Lab 12/17/18 1759 12/18/18 0339 12/19/18 0347  NA 122* 132* 131*  K 5.1 4.7 4.0  CL 89* 98 98  CO2 21* 22 26  GLUCOSE 136* 107* 139*  BUN 17 12 14   CREATININE 0.74 0.60 0.88  CALCIUM 9.8 9.5 9.4   Liver Function Tests: Recent Labs  Lab 12/17/18 1759  AST 39  ALT 27  ALKPHOS 77  BILITOT 0.6  PROT 7.2  ALBUMIN 4.0   Recent Labs  Lab 12/17/18 1759  LIPASE 41   No results for input(s): AMMONIA in the last 168 hours. CBC: Recent Labs  Lab 12/17/18 1948 12/18/18 0339  WBC 6.5 5.0  NEUTROABS 5.4  --   HGB 11.8* 11.7*  HCT 35.9* 34.9*  MCV 88.9 86.2  PLT 129* 123*   Cardiac Enzymes: Recent Labs  Lab 12/17/18 1759 12/18/18 0339 12/18/18 0526 12/18/18 1057 12/18/18 1619  TROPONINI <0.03 0.07*  0.06* 0.06* 0.03*   BNP: Invalid input(s): POCBNP CBG: Recent Labs  Lab 12/18/18 1122 12/18/18 1616 12/18/18 2049 12/19/18 0701 12/19/18 1139  GLUCAP 220* 122* 147* 122* 146*   D-Dimer No results for  input(s): DDIMER in the last 72 hours. Hgb A1c Recent Labs    12/19/18 0347  HGBA1C 6.3*   Lipid Profile Recent Labs    12/19/18 0347  CHOL 144  HDL 51  LDLCALC 72  TRIG 103  CHOLHDL 2.8   Thyroid function studies Recent Labs    12/18/18 0339  TSH 11.154*   Anemia work up No results for input(s): VITAMINB12, FOLATE, FERRITIN, TIBC, IRON, RETICCTPCT in the last 72 hours. Urinalysis    Component Value Date/Time   COLORURINE YELLOW 08/03/2017 1510   APPEARANCEUR CLEAR 08/03/2017 1510   LABSPEC 1.025 08/03/2017 1510   PHURINE 5.5 08/03/2017 1510   GLUCOSEU NEGATIVE 08/03/2017 1510   HGBUR TRACE (A) 08/03/2017 1510   BILIRUBINUR NEGATIVE 08/03/2017 1510   KETONESUR NEGATIVE 08/03/2017 1510   PROTEINUR NEGATIVE 08/03/2017 1510   UROBILINOGEN 0.2 07/23/2014 1445   NITRITE NEGATIVE 08/03/2017 1510   LEUKOCYTESUR TRACE (A) 08/03/2017 1510   Sepsis Labs Invalid input(s): PROCALCITONIN,  WBC,  LACTICIDVEN Microbiology Recent Results (from the past 240 hour(s))  SARS Coronavirus 2 [facility] order, Performed in [place] hospital lab)     Status: None   Collection Time: 12/17/18  6:16 PM  Result Value Ref Range Status   SARS Coronavirus 2 NEGATIVE NEGATIVE Final    Comment: (NOTE) If result is NEGATIVE SARS-CoV-2 target nucleic acids are NOT DETECTED. The SARS-CoV-2 RNA is generally detectable in upper and lower  respiratory specimens during the acute phase of infection. The lowest  concentration of SARS-CoV-2 viral copies this assay can detect is 250  copies / mL. A negative result does not preclude SARS-CoV-2 infection  and should not be used as the sole basis for treatment or other  patient management decisions.  A negative result may occur with  improper  specimen collection / handling, submission of specimen other  than nasopharyngeal swab, presence of viral mutation(s) within the  areas targeted by this assay, and inadequate number of viral copies  (<250 copies / mL). A negative result must be combined with clinical  observations, patient history, and epidemiological information. If result is POSITIVE SARS-CoV-2 target nucleic acids are DETECTED. The SARS-CoV-2 RNA is generally detectable in upper and lower  respiratory specimens dur ing the acute phase of infection.  Positive  results are indicative of active infection with SARS-CoV-2.  Clinical  correlation with patient history and other diagnostic information is  necessary to determine patient infection status.  Positive results do  not rule out bacterial infection or co-infection with other viruses. If result is PRESUMPTIVE POSTIVE SARS-CoV-2 nucleic acids MAY BE PRESENT.   A presumptive positive result was obtained on the submitted specimen  and confirmed on repeat testing.  While 2019 novel coronavirus  (SARS-CoV-2) nucleic acids may be present in the submitted sample  additional confirmatory testing may be necessary for epidemiological  and / or clinical management purposes  to differentiate between  SARS-CoV-2 and other Sarbecovirus currently known to infect humans.  If clinically indicated additional testing with an alternate test  methodology [phone]) is advised. The SARS-CoV-2 RNA is generally  detectable in upper and lower respiratory sp ecimens during the acute  phase of infection. The expected result is Negative. Fact Sheet for Patients:  StrictlyIdeas.no Fact Sheet for Healthcare Providers: BankingDealers.co.za This test is not yet approved or cleared by the [place] FDA and has been authorized for detection and/or diagnosis of SARS-CoV-2 by FDA under an Emergency Use Authorization (EUA).  This EUA will remain in  effect (meaning this test can be used) for the duration of the COVID-19 declaration under Section 564(b)(1) of the Act, 21 U.S.C. section 360bbb-3(b)(1), unless the authorization is terminated or revoked sooner. Performed at [facility], [place] [address], [place], [place] [address]   Blood Culture (routine x 2)     Status: None (Preliminary result)   Collection Time: 12/17/18  6:52 PM  Result Value Ref Range Status   Specimen Description BLOOD LEFT ARM  Final   Special Requests   Final    BOTTLES DRAWN AEROBIC AND ANAEROBIC Blood Culture adequate volume   Culture   Final    NO GROWTH 2 DAYS Performed at [facility], [address] [place], [place] [address]    Report Status PENDING  Incomplete  Blood Culture (routine x 2)     Status: None (Preliminary result)   Collection Time: 12/17/18  7:44 PM  Result Value Ref Range Status   Specimen Description BLOOD RIGHT HAND  Final   Special Requests   Final    BOTTLES DRAWN AEROBIC AND ANAEROBIC Blood Culture adequate volume   Culture   Final    NO GROWTH 2 DAYS Performed at [facility], [place] [address] [place], [place] [address]    Report Status PENDING  Incomplete     Time coordinating discharge:  25 minutes  SIGNED:   Barb Merino, MD  [facility] 12/19/2018, 3:11 PM Pager [phone]  If 7PM-7AM, please contact night-coverage [email or web address] Password [email or web address]

## 2018-12-19 NOTE — Progress Notes (Signed)
SATURATION QUALIFICATIONS: (This note is used to comply with regulatory documentation for home oxygen)  Patient Saturations on Room Air at Rest = 95%  Patient Saturations on Room Air while Ambulating = 99%  Patient Saturations on 0 Liters of oxygen while Ambulating = 99%  Please briefly explain why patient needs home oxygen: Does not need O2.  [name], RN

## 2018-12-21 DIAGNOSIS — R1312 Dysphagia, oropharyngeal phase: Secondary | ICD-10-CM | POA: Diagnosis not present

## 2018-12-21 DIAGNOSIS — R109 Unspecified abdominal pain: Secondary | ICD-10-CM | POA: Diagnosis not present

## 2018-12-21 DIAGNOSIS — R072 Precordial pain: Secondary | ICD-10-CM | POA: Diagnosis not present

## 2018-12-21 DIAGNOSIS — I350 Nonrheumatic aortic (valve) stenosis: Secondary | ICD-10-CM | POA: Diagnosis not present

## 2018-12-21 DIAGNOSIS — R140 Abdominal distension (gaseous): Secondary | ICD-10-CM | POA: Diagnosis not present

## 2018-12-22 ENCOUNTER — Telehealth: Payer: Self-pay | Admitting: *Deleted

## 2018-12-22 LAB — CULTURE, BLOOD (ROUTINE X 2)
Culture: NO GROWTH
Culture: NO GROWTH
Special Requests: ADEQUATE
Special Requests: ADEQUATE

## 2018-12-22 NOTE — Telephone Encounter (Signed)
REFERRAL SENT TO SCHEDULING AND NOTES ON FILE AND SCANNED IN PT'S CHART FROM DR. PARAG BRAHMBHATT [phone] FOR NEW DIAGNOSIS.

## 2018-12-23 DIAGNOSIS — E871 Hypo-osmolality and hyponatremia: Secondary | ICD-10-CM | POA: Diagnosis not present

## 2018-12-23 DIAGNOSIS — D649 Anemia, unspecified: Secondary | ICD-10-CM | POA: Diagnosis not present

## 2018-12-23 DIAGNOSIS — I350 Nonrheumatic aortic (valve) stenosis: Secondary | ICD-10-CM | POA: Diagnosis not present

## 2018-12-23 DIAGNOSIS — R109 Unspecified abdominal pain: Secondary | ICD-10-CM | POA: Diagnosis not present

## 2018-12-23 DIAGNOSIS — J810 Acute pulmonary edema: Secondary | ICD-10-CM | POA: Diagnosis not present

## 2018-12-26 ENCOUNTER — Other Ambulatory Visit: Payer: Self-pay | Admitting: Gastroenterology

## 2018-12-26 DIAGNOSIS — R1312 Dysphagia, oropharyngeal phase: Secondary | ICD-10-CM

## 2018-12-28 ENCOUNTER — Telehealth: Payer: Self-pay | Admitting: Interventional Cardiology

## 2018-12-28 NOTE — Telephone Encounter (Signed)
Spoke with daughter, DPR on file.  Pt in hospital 5/2-5/4.  Admitting dx hyponatremia and periumbilical abd pain.  Daughter calling with concerns that her mom still isn't doing very well.  Stomach is killing her, nauseous, weak, and hard for her to eat.  Pt seen PCP this past Friday.  At Dr. Eddie Dibbles office BP was 100/??.  Daughter states pt is taking in plenty of fluids (nostly decaf and sugar free gatorade).  She was also concerned that a new dx of HF is not in pt's chart.  She would like Dr. Tamala Julian to review hospitalization and see if HF is a true dx and make any recommendations he felt beneficial for pt?  Also inquired about needing to see Dr. Tamala Julian sooner or if echo appt needed to be moved up?  Pt currently scheduled for echo end of Aug and appt with Dr. Tamala Julian beginning of Sept.

## 2018-12-28 NOTE — Telephone Encounter (Signed)
Patient's daughter is calling she would like to speak to Dr. Thompson Caul nurse.  As her mother recently in the ER she states and she needs to speak to someone about her diet. She just not doing well.

## 2018-12-29 NOTE — Telephone Encounter (Signed)
Reviewed records. No indicator of heart problem during admission

## 2018-12-30 NOTE — Telephone Encounter (Signed)
Spoke with daughter and made her aware of what Dr. Tamala Julian said.  Daughter appreciative for call.

## 2019-01-02 ENCOUNTER — Telehealth: Payer: Self-pay | Admitting: Interventional Cardiology

## 2019-01-02 NOTE — Telephone Encounter (Signed)
Spoke with Pamala Hurry at Dr. Eddie Dibbles office.  Dr. Rex Kras sent her a message on Friday asking for pt to be seen in our office.  Pt's Lasix has been decreased to 20mg  QOD due to hyponatremia.  Pt has gained 2.3lbs.  BNP in hospital was 224.2 on 12/17/2018.  Dyspnea has improved since hospitalization per Dr. Eddie Dibbles note. Dr. Rex Kras checked labs as well:  Na- 128 WBC- 3.9 RBC- 3.94 Hgb- 11.6 Hct- 34.6 Platlets- 129 Ferritin- 25.4  No kidney function or BNP performed at the office.  Dr. Rex Kras is advising pt be seen in person.  Will route to Dr. Tamala Julian for review.    Dr. Tamala Julian- you are not back in the office until 6/11 to see her in person.  I can put her on with DOD if you feel needed.  She is currently scheduled for an echo in August but we can move up if needed.  I will see if I can have the medical records team scan in Dr. Eddie Dibbles note.  It will be in the media tab.

## 2019-01-02 NOTE — Telephone Encounter (Signed)
Dr. Eddie Dibbles office called wanting this patient to been seen in office ASAP, she has abnormal labs, she is going into CHF.

## 2019-01-02 NOTE — Telephone Encounter (Signed)
She had cardiology consult by Dr. Rayann Heman on May 4. Is she short of breath? Is she swellin? If neither, Will see her on 6/11. Otherwise, may need to see DOD (which I would like to avoid unless serious symptoms)

## 2019-01-02 NOTE — Telephone Encounter (Signed)
Spoke with pt and she denies SOB or swelling since d/c from the hospital.  States she feels pretty good.  Scheduled pt to see Dr. Tamala Julian 6/11 and advised to call sooner if SOB or swelling develops.

## 2019-01-05 ENCOUNTER — Ambulatory Visit
Admission: RE | Admit: 2019-01-05 | Discharge: 2019-01-05 | Disposition: A | Discharge status: Home / Self Care | Payer: Medicare Other | Source: Ambulatory Visit | Attending: Gastroenterology | Admitting: Gastroenterology

## 2019-01-05 ENCOUNTER — Other Ambulatory Visit: Payer: Self-pay | Admitting: Gastroenterology

## 2019-01-05 DIAGNOSIS — R1312 Dysphagia, oropharyngeal phase: Secondary | ICD-10-CM

## 2019-01-10 ENCOUNTER — Telehealth: Payer: Self-pay | Admitting: Interventional Cardiology

## 2019-01-10 NOTE — Telephone Encounter (Addendum)
Spoke to pt and moved the time of appt down to 10AM as she needed a later appt if possible.  Pt appreciative for call.

## 2019-01-10 NOTE — Telephone Encounter (Signed)
Follow up:    Patient states some one called her today and she is retuning the call back. I did not see a note.

## 2019-01-12 DIAGNOSIS — N3000 Acute cystitis without hematuria: Secondary | ICD-10-CM | POA: Diagnosis not present

## 2019-01-12 DIAGNOSIS — N390 Urinary tract infection, site not specified: Secondary | ICD-10-CM | POA: Diagnosis not present

## 2019-01-12 DIAGNOSIS — R3915 Urgency of urination: Secondary | ICD-10-CM | POA: Diagnosis not present

## 2019-01-12 DIAGNOSIS — N3941 Urge incontinence: Secondary | ICD-10-CM | POA: Diagnosis not present

## 2019-01-16 DIAGNOSIS — D649 Anemia, unspecified: Secondary | ICD-10-CM | POA: Diagnosis not present

## 2019-01-19 ENCOUNTER — Other Ambulatory Visit: Payer: Self-pay | Admitting: Obstetrics & Gynecology

## 2019-01-19 DIAGNOSIS — L309 Dermatitis, unspecified: Secondary | ICD-10-CM | POA: Diagnosis not present

## 2019-01-19 DIAGNOSIS — N9089 Other specified noninflammatory disorders of vulva and perineum: Secondary | ICD-10-CM | POA: Diagnosis not present

## 2019-01-19 DIAGNOSIS — C519 Malignant neoplasm of vulva, unspecified: Secondary | ICD-10-CM | POA: Diagnosis not present

## 2019-01-24 ENCOUNTER — Telehealth: Payer: Self-pay

## 2019-01-24 ENCOUNTER — Telehealth: Payer: Self-pay | Admitting: *Deleted

## 2019-01-24 DIAGNOSIS — R1312 Dysphagia, oropharyngeal phase: Secondary | ICD-10-CM | POA: Diagnosis not present

## 2019-01-24 DIAGNOSIS — R140 Abdominal distension (gaseous): Secondary | ICD-10-CM | POA: Diagnosis not present

## 2019-01-24 DIAGNOSIS — I350 Nonrheumatic aortic (valve) stenosis: Secondary | ICD-10-CM | POA: Diagnosis not present

## 2019-01-24 DIAGNOSIS — R072 Precordial pain: Secondary | ICD-10-CM | POA: Diagnosis not present

## 2019-01-24 DIAGNOSIS — R1013 Epigastric pain: Secondary | ICD-10-CM | POA: Diagnosis not present

## 2019-01-24 NOTE — Telephone Encounter (Signed)
Called pt to ask COVID screening questions for her appt with Dr Tamala Julian on 6/11.Marland KitchenMarland KitchenNo answer.

## 2019-01-24 NOTE — Telephone Encounter (Signed)
Called and left the patient a message to call the office back. Need to schedule for a new patient appt

## 2019-01-25 ENCOUNTER — Telehealth: Payer: Self-pay | Admitting: *Deleted

## 2019-01-25 NOTE — Telephone Encounter (Signed)
Returned the patients call and scheduled the new patient referral. Gave instructions on parking and visitors/mask

## 2019-01-26 ENCOUNTER — Ambulatory Visit (INDEPENDENT_AMBULATORY_CARE_PROVIDER_SITE_OTHER): Payer: Medicare Other | Admitting: Interventional Cardiology

## 2019-01-26 ENCOUNTER — Other Ambulatory Visit: Payer: Self-pay

## 2019-01-26 ENCOUNTER — Encounter: Payer: Self-pay | Admitting: Interventional Cardiology

## 2019-01-26 VITALS — BP 102/62 | HR 61 | Ht 64.0 in | Wt 188.2 lb

## 2019-01-26 DIAGNOSIS — I4510 Unspecified right bundle-branch block: Secondary | ICD-10-CM

## 2019-01-26 DIAGNOSIS — I10 Essential (primary) hypertension: Secondary | ICD-10-CM

## 2019-01-26 DIAGNOSIS — I350 Nonrheumatic aortic (valve) stenosis: Secondary | ICD-10-CM | POA: Diagnosis not present

## 2019-01-26 DIAGNOSIS — I495 Sick sinus syndrome: Secondary | ICD-10-CM | POA: Diagnosis not present

## 2019-01-26 DIAGNOSIS — I5032 Chronic diastolic (congestive) heart failure: Secondary | ICD-10-CM

## 2019-01-26 NOTE — Patient Instructions (Signed)
Medication Instructions:  Your physician recommends that you continue on your current medications as directed. Please refer to the Current Medication list given to you today.  If you need a refill on your cardiac medications before your next appointment, please call your pharmacy.   Lab work: None If you have labs (blood work) drawn today and your tests are completely normal, you will receive your results only by: Marland Kitchen MyChart Message (if you have MyChart) OR . A paper copy in the mail If you have any lab test that is abnormal or we need to change your treatment, we will call you to review the results.  Testing/Procedures: Your physician has requested that you have an echocardiogram in November. Echocardiography is a painless test that uses sound waves to create images of your heart. It provides your doctor with information about the size and shape of your heart and how well your heart's chambers and valves are working. This procedure takes approximately one hour. There are no restrictions for this procedure.   Follow-Up: At [facility], you and your health needs are our priority.  As part of our continuing mission to provide you with exceptional heart care, we have created designated Provider Care Teams.  These Care Teams include your primary Cardiologist (physician) and Advanced Practice Providers (APPs -  Physician Assistants and Nurse Practitioners) who all work together to provide you with the care you need, when you need it. You will need a follow up appointment in 12 months after echocardiogram.  Please call our office 2 months in advance to schedule this appointment.  You may see Sinclair Grooms, MD or one of the following Advanced Practice Providers on your designated Care Team:   Truitt Merle, NP Cecilie Kicks, NP . Kathyrn Drown, NP  Any Other Special Instructions Will Be Listed Below (If Applicable).
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
## 2019-01-29 ENCOUNTER — Telehealth: Payer: Self-pay | Admitting: Student

## 2019-01-29 NOTE — Telephone Encounter (Signed)
    Patient called the after hours line reporting she believes she accidentally took her husband's AM medications instead of her own with that including Amiodarone, Coreg and Aricept.   She denies any symptoms at this time. Feels well but was concerned about what to monitor for.   I recommended she follow her HR and BP this afternoon and evening then resume her normal medications this evening if HR and BP are stable. She is aware that if she develops any symptoms or reactions she should proceed to the ED for evaluation.   She voiced understanding of the above information and was appreciative of the call.   Signed, Erma Heritage, PA-C 01/29/2019, 1:00 PM Pager: [phone]

## 2019-02-01 ENCOUNTER — Telehealth: Payer: Self-pay | Admitting: *Deleted

## 2019-02-01 ENCOUNTER — Inpatient Hospital Stay: Payer: Medicare Other | Attending: Gynecologic Oncology | Admitting: Gynecologic Oncology

## 2019-02-01 ENCOUNTER — Other Ambulatory Visit: Payer: Self-pay

## 2019-02-01 ENCOUNTER — Encounter: Payer: Self-pay | Admitting: Gynecologic Oncology

## 2019-02-01 VITALS — BP 157/88 | HR 67 | Temp 98.5°F | Resp 18 | Ht 64.0 in | Wt 188.8 lb

## 2019-02-01 DIAGNOSIS — Z90710 Acquired absence of both cervix and uterus: Secondary | ICD-10-CM | POA: Diagnosis not present

## 2019-02-01 DIAGNOSIS — C519 Malignant neoplasm of vulva, unspecified: Secondary | ICD-10-CM | POA: Insufficient documentation
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
## 2019-02-01 NOTE — Patient Instructions (Signed)
Plan for surgery at [facility] with Dr. Margaretmary Bayley on February 13, 2019. Your pre-op appointment will be on February 07, 2019 at 11:15am and they ask that you arrive at 11 am to get checked in.

## 2019-02-01 NOTE — Telephone Encounter (Signed)
Called and got the patient a [facility] medical chart started, MRN [id number]

## 2019-02-02 ENCOUNTER — Telehealth: Payer: Self-pay | Admitting: *Deleted

## 2019-02-02 NOTE — Telephone Encounter (Signed)
Per Lenna Sciara APP I have faxed the records to [facility]

## 2019-02-07 ENCOUNTER — Telehealth: Payer: Self-pay | Admitting: Interventional Cardiology

## 2019-02-07 DIAGNOSIS — Z01812 Encounter for preprocedural laboratory examination: Secondary | ICD-10-CM | POA: Diagnosis not present

## 2019-02-07 DIAGNOSIS — I509 Heart failure, unspecified: Secondary | ICD-10-CM | POA: Diagnosis not present

## 2019-02-07 DIAGNOSIS — Z7982 Long term (current) use of aspirin: Secondary | ICD-10-CM | POA: Diagnosis not present

## 2019-02-07 DIAGNOSIS — E1139 Type 2 diabetes mellitus with other diabetic ophthalmic complication: Secondary | ICD-10-CM | POA: Diagnosis not present

## 2019-02-07 DIAGNOSIS — Z96659 Presence of unspecified artificial knee joint: Secondary | ICD-10-CM | POA: Diagnosis not present

## 2019-02-07 DIAGNOSIS — I110 Hypertensive heart disease with heart failure: Secondary | ICD-10-CM | POA: Diagnosis not present

## 2019-02-07 DIAGNOSIS — C519 Malignant neoplasm of vulva, unspecified: Secondary | ICD-10-CM | POA: Diagnosis not present

## 2019-02-07 DIAGNOSIS — Z96619 Presence of unspecified artificial shoulder joint: Secondary | ICD-10-CM | POA: Diagnosis not present

## 2019-02-07 DIAGNOSIS — Z01818 Encounter for other preprocedural examination: Secondary | ICD-10-CM | POA: Diagnosis not present

## 2019-02-07 NOTE — Telephone Encounter (Signed)
   Primary Cardiologist:Henry Nicholes Stairs III, MD  Chart reviewed as part of pre-operative protocol coverage. Dr. Tamala Julian recently commented on pre-operative clearance in his 01/26/19 office note. He states, "To this point she has had no cardiac issues that would prevent general anesthesia and surgery." I spoke with patient who affirms no new symptoms therefore this clearance still applies.  Will route this bundled recommendation to requesting provider via Epic fax function. Please call with questions.  Charlie Pitter, PA-C 02/07/2019, 12:10 PM

## 2019-02-07 NOTE — Telephone Encounter (Signed)
New Message        [facility] Pre-operative Risk Assessment    Request for surgical clearance:  1. What type of surgery is being performed? Reticle Vulvectomy   2. When is this surgery scheduled? 02/13/19  3. What type of clearance is required (medical clearance vs. Pharmacy clearance to hold med vs. Both)? Medical   4. Are there any medications that need to be held prior to surgery and how long?  No   5. Practice name and name of physician performing surgery? [facility]   6. What is your office phone number [phone]    7.   What is your office fax number [phone]   8.   Anesthesia type (None, local, MAC, general) ? General    Marca Ancona 02/07/2019, 11:20 AM  _________________________________________________________________   (provider comments below)

## 2019-02-09 DIAGNOSIS — E785 Hyperlipidemia, unspecified: Secondary | ICD-10-CM | POA: Diagnosis not present

## 2019-02-09 DIAGNOSIS — E119 Type 2 diabetes mellitus without complications: Secondary | ICD-10-CM | POA: Diagnosis not present

## 2019-02-09 DIAGNOSIS — C519 Malignant neoplasm of vulva, unspecified: Secondary | ICD-10-CM | POA: Diagnosis not present

## 2019-02-09 DIAGNOSIS — I350 Nonrheumatic aortic (valve) stenosis: Secondary | ICD-10-CM | POA: Diagnosis not present

## 2019-02-09 DIAGNOSIS — I10 Essential (primary) hypertension: Secondary | ICD-10-CM | POA: Diagnosis not present

## 2019-02-09 DIAGNOSIS — E039 Hypothyroidism, unspecified: Secondary | ICD-10-CM | POA: Diagnosis not present

## 2019-02-09 NOTE — Telephone Encounter (Signed)
I have efax this note again to the requesting provider. Callback pool to call the requesting provider tomorrow to verify if they have received it.

## 2019-02-09 NOTE — Telephone Encounter (Signed)
Follow up   Per [facility]  Oncology has not received the fax can you please refax information to [phone]. Please call if have any questions.

## 2019-02-10 NOTE — Telephone Encounter (Signed)
Called the requesting office spoke with Hailey Cobb and she confirmed that the fax was received.

## 2019-02-13 DIAGNOSIS — Z1159 Encounter for screening for other viral diseases: Secondary | ICD-10-CM | POA: Diagnosis not present

## 2019-02-13 DIAGNOSIS — Z01818 Encounter for other preprocedural examination: Secondary | ICD-10-CM | POA: Diagnosis not present

## 2019-02-14 NOTE — Telephone Encounter (Signed)
Forwarded to requesting party via EPIC fax function 

## 2019-02-14 NOTE — Telephone Encounter (Signed)
  Arnold Line is calling because they cannot find the fax. Can we please refax medical clearance to [phone]

## 2019-02-16 ENCOUNTER — Telehealth: Payer: Self-pay | Admitting: Interventional Cardiology

## 2019-02-16 DIAGNOSIS — Z7984 Long term (current) use of oral hypoglycemic drugs: Secondary | ICD-10-CM | POA: Diagnosis not present

## 2019-02-16 DIAGNOSIS — E785 Hyperlipidemia, unspecified: Secondary | ICD-10-CM | POA: Diagnosis not present

## 2019-02-16 DIAGNOSIS — Z7982 Long term (current) use of aspirin: Secondary | ICD-10-CM | POA: Diagnosis not present

## 2019-02-16 DIAGNOSIS — I493 Ventricular premature depolarization: Secondary | ICD-10-CM

## 2019-02-16 DIAGNOSIS — M1610 Unilateral primary osteoarthritis, unspecified hip: Secondary | ICD-10-CM | POA: Diagnosis not present

## 2019-02-16 DIAGNOSIS — I110 Hypertensive heart disease with heart failure: Secondary | ICD-10-CM | POA: Diagnosis not present

## 2019-02-16 DIAGNOSIS — E119 Type 2 diabetes mellitus without complications: Secondary | ICD-10-CM | POA: Diagnosis not present

## 2019-02-16 DIAGNOSIS — Z5309 Procedure and treatment not carried out because of other contraindication: Secondary | ICD-10-CM | POA: Diagnosis not present

## 2019-02-16 DIAGNOSIS — Z79899 Other long term (current) drug therapy: Secondary | ICD-10-CM | POA: Diagnosis not present

## 2019-02-16 DIAGNOSIS — M170 Bilateral primary osteoarthritis of knee: Secondary | ICD-10-CM | POA: Diagnosis not present

## 2019-02-16 DIAGNOSIS — C519 Malignant neoplasm of vulva, unspecified: Secondary | ICD-10-CM | POA: Diagnosis not present

## 2019-02-16 DIAGNOSIS — E890 Postprocedural hypothyroidism: Secondary | ICD-10-CM | POA: Diagnosis not present

## 2019-02-16 DIAGNOSIS — K219 Gastro-esophageal reflux disease without esophagitis: Secondary | ICD-10-CM | POA: Diagnosis not present

## 2019-02-16 DIAGNOSIS — M4690 Unspecified inflammatory spondylopathy, site unspecified: Secondary | ICD-10-CM | POA: Diagnosis not present

## 2019-02-16 DIAGNOSIS — I509 Heart failure, unspecified: Secondary | ICD-10-CM | POA: Diagnosis not present

## 2019-02-16 DIAGNOSIS — F419 Anxiety disorder, unspecified: Secondary | ICD-10-CM | POA: Diagnosis not present

## 2019-02-16 NOTE — Telephone Encounter (Signed)
She needs 48 hour Holter and repeat 2 D doppler Echocardiogram done ASAP, to get pre-op clearance.

## 2019-02-16 NOTE — Telephone Encounter (Signed)
I spoke to the patient's daughter [name]) who said that the patient went to [facility] for Reticle Vulvectomy, but during anesthesia induction the procedure was stopped because of a cardiac arrhythmia.    She has hx of PVCs and Dr Tamala Julian had cleared her for the surgery.  The daughter was calling for further advisement from Dr Tamala Julian.

## 2019-02-16 NOTE — Telephone Encounter (Signed)
I spoke to the patient's daughter and informed her of Dr Thompson Caul recommendations for clearance.

## 2019-02-16 NOTE — Telephone Encounter (Signed)
New Message   Patients daughter is calling because she went to [facility] today to have a procedure done but they were not able to do the procedure due to patients heart having a abnormal rhythm. Please call to discuss.

## 2019-02-16 NOTE — Telephone Encounter (Signed)
Dr. Daphene Jaeger from anesthesia at [facility] called, she would like to talk to Dr. Tamala Julian to discuss what happened today about patient's surgery.  Dr. Clovis Fredrickson can be reached at [phone]

## 2019-02-20 ENCOUNTER — Telehealth: Payer: Self-pay | Admitting: *Deleted

## 2019-02-20 ENCOUNTER — Telehealth: Payer: Self-pay | Admitting: Interventional Cardiology

## 2019-02-20 NOTE — Telephone Encounter (Signed)
3 day ZIO XT long term holter monitor to be mailed to patients home.  Instructions reviewed briefly as they are also included in the monitor kit.  Patient aware Dr. Tamala Julian would like her to wear at least 48 hours.

## 2019-02-20 NOTE — Telephone Encounter (Signed)
Spoke with daughter and she was wanting to see what was the quickest way to get her mother cleared, if she is able to be cleared, for her surgery.  Advised the plan for the monitor and echo, as scheduled, is what Dr. Tamala Julian is wanting so we can get her cleared if possible.  Daughter was also concerned because she said pt had received injections for pain prior to going into OR and she was afraid this could have caused the episode with the heart and pt will require them again if they proceed with procedure.  Advised we need to do the testing as ordered and go from there.  Daughter appreciative for call.

## 2019-02-20 NOTE — Telephone Encounter (Signed)
  Daughter is calling because the patient was supposed to have surgery and her heart caused them to cancel the surgery. Her daughter would like to speak to the nurse to ask some questions.

## 2019-02-22 ENCOUNTER — Ambulatory Visit (HOSPITAL_COMMUNITY): Payer: Medicare Other | Attending: Cardiovascular Disease

## 2019-02-22 ENCOUNTER — Other Ambulatory Visit: Payer: Self-pay

## 2019-02-22 DIAGNOSIS — I350 Nonrheumatic aortic (valve) stenosis: Secondary | ICD-10-CM | POA: Diagnosis not present

## 2019-02-22 DIAGNOSIS — I340 Nonrheumatic mitral (valve) insufficiency: Secondary | ICD-10-CM | POA: Insufficient documentation

## 2019-02-22 DIAGNOSIS — I493 Ventricular premature depolarization: Secondary | ICD-10-CM | POA: Diagnosis not present

## 2019-02-23 ENCOUNTER — Telehealth: Payer: Self-pay | Admitting: Interventional Cardiology

## 2019-02-23 NOTE — Telephone Encounter (Signed)
New Message          Patient's daughter is calling to see if we can expedite her mom's holter monitor, she states they were told they would get it on Monday as of today they have not rec'vd it. Pls call to advise.

## 2019-02-23 NOTE — Telephone Encounter (Signed)
Spoke to daughter her moms monitor was enrolled on Monday 7-6. It generally takes 3-4 days on average for the monitor to arrive. I did call [employer] and they were able to track her moms monitor and it is scheduled to arrive tom 7-10 by 8pm

## 2019-02-24 ENCOUNTER — Telehealth: Payer: Self-pay | Admitting: Gynecologic Oncology

## 2019-02-24 ENCOUNTER — Telehealth: Payer: Self-pay | Admitting: *Deleted

## 2019-02-24 NOTE — Telephone Encounter (Signed)
Patient's daughter called and stated "Dr. Thurston Pounds was going to do surgery, but mom had some heart issues. So Dr. Thurston Pounds stated that mom would need radiation treatments and come do that here in [place]. Would that be with Dr. Denman George."  Explained that I would give the message to [facility] APP and that we will call her back. Also explained that Dr. Sondra Come does the radiation treatments.

## 2019-02-25 ENCOUNTER — Ambulatory Visit (INDEPENDENT_AMBULATORY_CARE_PROVIDER_SITE_OTHER): Payer: Medicare Other

## 2019-02-25 DIAGNOSIS — I493 Ventricular premature depolarization: Secondary | ICD-10-CM | POA: Diagnosis not present

## 2019-02-27 ENCOUNTER — Telehealth: Payer: Self-pay | Admitting: *Deleted

## 2019-02-27 NOTE — Telephone Encounter (Signed)
Patient's daughter called regarding her mother's appt at [facility] tomorrow with Dr. Thurston Pounds. Returned the call and spoke with the daughter, she stated "We really just need answers, we feel we really don't need to keep this appt. Dr. Lucianne Lei LE and just made to have. Mom can't even see cardiology until 7/27. Mom stated she feels like the cancer is growing. We really need to speak to a nurse or a doctor."  Explained that per Melissa APP from Friday she needs to keep the appt tomorrow. Daughter still wanted to speak with a nurse, patient's daughter forwarded to the desk RN.   Called and left Dr. Davene Costain nurse Deneise Lever a message, asking her to call the patient's .

## 2019-02-27 NOTE — Telephone Encounter (Signed)
Returned call to the patient's daughter and left message.  According to Dr. Thurston Pounds at [facility], the pt is to follow up this week with an appt to still discuss potential surgery and cardiac clearance. Advised her to call for any further needs.

## 2019-03-02 ENCOUNTER — Telehealth: Payer: Self-pay | Admitting: Oncology

## 2019-03-02 DIAGNOSIS — C519 Malignant neoplasm of vulva, unspecified: Secondary | ICD-10-CM

## 2019-03-02 NOTE — Telephone Encounter (Signed)
Called patient's daughter, [name] and discussed that the treatment recommendation per Dr. Denman George is for primary radiation.  Informed her of appointment with Dr. Sondra Come (2:30 pm nurse eval, 3:00 pm consult) on Monday, 03/06/19.  [name] verbalized agreement and understanding.

## 2019-03-02 NOTE — Progress Notes (Signed)
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  Hailey Cobb presented with symptoms of: The patient reports vulvar irritation for approximately 1 month.  She was seen for evaluation for this vulvar irritation by Dr. Nelda Marseille on January 19, 2019.  Inspection of the vulva revealed a posterior lesion that was biopsied.  Additionally there was atrophy and lichen sclerotic changes to the anterior vulva.  The biopsy from the posterior introitus revealed invasive squamous cell carcinoma.  Biopsies revealed: 01/19/19: FINAL DIAGNOSIS Diagnosis Vulva, biopsy, lesion -INVASIVE SQUAMOUS CELL CARCINOMA.   Past/Anticipated interventions by Gyn/Onc surgery, if any: None at this time.  Past/Anticipated interventions by medical oncology, if any: TBD at Tumor Board  Weight changes, if any:  Wt Readings from Last 3 Encounters:  03/06/19 188 lb (85.3 kg)  02/01/19 188 lb 12.8 oz (85.6 kg)  01/26/19 188 lb 3.2 oz (85.4 kg)     Bowel/Bladder complaints, if any: Pt denies dysuria/hematuria. Pt denies vaginal bleeding/discharge. Pt denies rectal bleeding. Pt reports taking OTC medications for regularity.   Nausea/Vomiting, if any: Pt reports abdominal bloating that began approximately 8 months ago with associated nausea. No vomiting.  Pain issues, if any:  Pt denies c/o pain while sitting in exam room but with touch, vulva is painful. Pt describes vulva as "raw".   SAFETY ISSUES:  Prior radiation? No  Pacemaker/ICD? No  Possible current pregnancy? No  Is the patient on methotrexate? No   Current Complaints / other details:  Pt presents today for initial consult with Dr. Sondra Come for Radiation Oncology. Pt is accompanied by daughter due to cognitive concerns.   BP (!) 150/64 (BP Location: Left Arm, Patient Position: Sitting)   Pulse 76   Temp 97.7 F (36.5 C) (Temporal)   Resp 18   Ht 5\' 4"  (1.626 m)   Wt 188 lb (85.3 kg)   LMP  (LMP Unknown)    SpO2 97%   BMI 32.27 kg/m   Loma Sousa, RN BSN     Called patient's daughter, [name] and discussed that the treatment recommendation per Dr. Denman George is for primary radiation.  Informed her of appointment with Dr. Sondra Come (2:30 pm nurse eval, 3:00 pm consult) on Monday, 03/06/19.  [name] verbalized agreement and understanding.  Electronically signed by Jacqulyn Liner, RN at 03/02/2019 2:03 PM

## 2019-03-06 ENCOUNTER — Encounter: Payer: Self-pay | Admitting: Radiation Oncology

## 2019-03-06 ENCOUNTER — Ambulatory Visit
Admission: RE | Admit: 2019-03-06 | Discharge: 2019-03-06 | Disposition: A | Discharge status: Home / Self Care | Payer: Medicare Other | Source: Ambulatory Visit | Attending: Radiation Oncology | Admitting: Radiation Oncology

## 2019-03-06 ENCOUNTER — Other Ambulatory Visit: Payer: Self-pay

## 2019-03-06 VITALS — BP 150/64 | HR 76 | Temp 97.7°F | Resp 18 | Ht 64.0 in | Wt 188.0 lb

## 2019-03-06 DIAGNOSIS — I493 Ventricular premature depolarization: Secondary | ICD-10-CM | POA: Diagnosis not present

## 2019-03-06 DIAGNOSIS — R140 Abdominal distension (gaseous): Secondary | ICD-10-CM | POA: Insufficient documentation

## 2019-03-06 DIAGNOSIS — R008 Other abnormalities of heart beat: Secondary | ICD-10-CM | POA: Diagnosis not present

## 2019-03-06 DIAGNOSIS — Z7984 Long term (current) use of oral hypoglycemic drugs: Secondary | ICD-10-CM | POA: Diagnosis not present

## 2019-03-06 DIAGNOSIS — R011 Cardiac murmur, unspecified: Secondary | ICD-10-CM | POA: Diagnosis not present

## 2019-03-06 DIAGNOSIS — E119 Type 2 diabetes mellitus without complications: Secondary | ICD-10-CM | POA: Diagnosis not present

## 2019-03-06 DIAGNOSIS — J90 Pleural effusion, not elsewhere classified: Secondary | ICD-10-CM | POA: Diagnosis not present

## 2019-03-06 DIAGNOSIS — Z800 Family history of malignant neoplasm of digestive organs: Secondary | ICD-10-CM | POA: Insufficient documentation

## 2019-03-06 DIAGNOSIS — E7800 Pure hypercholesterolemia, unspecified: Secondary | ICD-10-CM | POA: Diagnosis not present

## 2019-03-06 DIAGNOSIS — I10 Essential (primary) hypertension: Secondary | ICD-10-CM | POA: Diagnosis not present

## 2019-03-06 DIAGNOSIS — Z8042 Family history of malignant neoplasm of prostate: Secondary | ICD-10-CM | POA: Diagnosis not present

## 2019-03-06 DIAGNOSIS — F419 Anxiety disorder, unspecified: Secondary | ICD-10-CM | POA: Diagnosis not present

## 2019-03-06 DIAGNOSIS — K219 Gastro-esophageal reflux disease without esophagitis: Secondary | ICD-10-CM | POA: Insufficient documentation

## 2019-03-06 DIAGNOSIS — M1990 Unspecified osteoarthritis, unspecified site: Secondary | ICD-10-CM | POA: Insufficient documentation

## 2019-03-06 DIAGNOSIS — C518 Malignant neoplasm of overlapping sites of vulva: Secondary | ICD-10-CM | POA: Diagnosis not present

## 2019-03-06 DIAGNOSIS — C519 Malignant neoplasm of vulva, unspecified: Secondary | ICD-10-CM | POA: Diagnosis not present

## 2019-03-06 DIAGNOSIS — E039 Hypothyroidism, unspecified: Secondary | ICD-10-CM | POA: Diagnosis not present

## 2019-03-06 DIAGNOSIS — G40909 Epilepsy, unspecified, not intractable, without status epilepticus: Secondary | ICD-10-CM | POA: Insufficient documentation

## 2019-03-06 DIAGNOSIS — K754 Autoimmune hepatitis: Secondary | ICD-10-CM | POA: Insufficient documentation

## 2019-03-06 DIAGNOSIS — R110 Nausea: Secondary | ICD-10-CM | POA: Insufficient documentation

## 2019-03-06 DIAGNOSIS — R590 Localized enlarged lymph nodes: Secondary | ICD-10-CM | POA: Diagnosis not present

## 2019-03-06 DIAGNOSIS — Z79899 Other long term (current) drug therapy: Secondary | ICD-10-CM | POA: Insufficient documentation

## 2019-03-06 NOTE — Patient Instructions (Signed)
Coronavirus (COVID-19) Are you at risk?  Are you at risk for the Coronavirus (COVID-19)?  To be considered HIGH RISK for Coronavirus (COVID-19), you have to meet the following criteria:  . Traveled to [place], [place], [place], [place] or [place]; or in the [place] to [place], [place], [place], or [place]; and have fever, cough, and shortness of breath within the last 2 weeks of travel OR . Been in close contact with a person diagnosed with COVID-19 within the last 2 weeks and have fever, cough, and shortness of breath . IF YOU DO NOT MEET THESE CRITERIA, YOU ARE CONSIDERED LOW RISK FOR COVID-19.  What to do if you are HIGH RISK for COVID-19?  . If you are having a medical emergency, call 911. . Seek medical care right away. Before you go to a doctor's office, urgent care or emergency department, call ahead and tell them about your recent travel, contact with someone diagnosed with COVID-19, and your symptoms. You should receive instructions from your physician's office regarding next steps of care.  . When you arrive at healthcare provider, tell the healthcare staff immediately you have returned from visiting [place], [place], [place], [place] or [place]; or traveled in the [place] to [place], [place], [place], or [place]; in the last two weeks or you have been in close contact with a person diagnosed with COVID-19 in the last 2 weeks.   . Tell the health care staff about your symptoms: fever, cough and shortness of breath. . After you have been seen by a medical provider, you will be either: o Tested for (COVID-19) and discharged home on quarantine except to seek medical care if symptoms worsen, and asked to  - Stay home and avoid contact with others until you get your results (4-5 days)  - Avoid travel on public transportation if possible (such as bus, train, or airplane) or o Sent to the Emergency Department by EMS for evaluation, COVID-19 testing, and possible  admission depending on your condition and test results.  What to do if you are LOW RISK for COVID-19?  Reduce your risk of any infection by using the same precautions used for avoiding the common cold or flu:  . Wash your hands often with soap and warm water for at least 20 seconds.  If soap and water are not readily available, use an alcohol-based hand sanitizer with at least 60% alcohol.  . If coughing or sneezing, cover your mouth and nose by coughing or sneezing into the elbow areas of your shirt or coat, into a tissue or into your sleeve (not your hands). . Avoid shaking hands with others and consider head nods or verbal greetings only. . Avoid touching your eyes, nose, or mouth with unwashed hands.  . Avoid close contact with people who are sick. . Avoid places or events with large numbers of people in one location, like concerts or sporting events. . Carefully consider travel plans you have or are making. . If you are planning any travel outside or inside the [place], visit the CDC's Travelers' Health webpage for the latest health notices. . If you have some symptoms but not all symptoms, continue to monitor at home and seek medical attention if your symptoms worsen. . If you are having a medical emergency, call 911.   ADDITIONAL HEALTHCARE OPTIONS FOR PATIENTS  [facility] / e-Visit: [email or web address]         [facility]: [phone]  [facility]: [phone]                   [facility]: [phone]   
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
## 2019-03-07 ENCOUNTER — Other Ambulatory Visit: Payer: Self-pay | Admitting: Interventional Cardiology

## 2019-03-07 DIAGNOSIS — H15833 Staphyloma posticum, bilateral: Secondary | ICD-10-CM | POA: Diagnosis not present

## 2019-03-07 DIAGNOSIS — H43813 Vitreous degeneration, bilateral: Secondary | ICD-10-CM | POA: Diagnosis not present

## 2019-03-07 DIAGNOSIS — I493 Ventricular premature depolarization: Secondary | ICD-10-CM

## 2019-03-07 DIAGNOSIS — E113291 Type 2 diabetes mellitus with mild nonproliferative diabetic retinopathy without macular edema, right eye: Secondary | ICD-10-CM | POA: Diagnosis not present

## 2019-03-07 DIAGNOSIS — E113212 Type 2 diabetes mellitus with mild nonproliferative diabetic retinopathy with macular edema, left eye: Secondary | ICD-10-CM | POA: Diagnosis not present

## 2019-03-07 DIAGNOSIS — H35371 Puckering of macula, right eye: Secondary | ICD-10-CM | POA: Diagnosis not present

## 2019-03-09 ENCOUNTER — Telehealth: Payer: Self-pay | Admitting: *Deleted

## 2019-03-09 NOTE — Telephone Encounter (Signed)
Called patient's daughter, [name] to inform of Pet Scan for 7-28/20 - arrival time - 11:30 am @ [facility], pt. to be NPO- 6 hrs. prior to test, patient to avoid carbs. 24- hrs. prior to PET, patient's daughter [name] verified understanding this and agreed to day and time of scan

## 2019-03-10 ENCOUNTER — Telehealth: Payer: Self-pay | Admitting: *Deleted

## 2019-03-10 NOTE — Telephone Encounter (Signed)
Pt has answered NO to the following covid19 prescreen questions and will be accompanied by her daughter.       COVID-19 Pre-Screening Questions:  . In the past 7 to 10 days have you had a cough,  shortness of breath, headache, congestion, fever (100 or greater) body aches, chills, sore throat, or sudden loss of taste or sense of smell? . Have you been around anyone with known Covid 19. . Have you been around anyone who is awaiting Covid 19 test results in the past 7 to 10 days? . Have you been around anyone who has been exposed to Covid 19, or has mentioned symptoms of Covid 19 within the past 7 to 10 days?  If you have any concerns/questions about symptoms patients report during screening (either on the phone or at threshold). Contact the provider seeing the patient or DOD for further guidance.  If neither are available contact a member of the leadership team.
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
## 2019-03-13 ENCOUNTER — Encounter: Payer: Self-pay | Admitting: Oncology

## 2019-03-13 ENCOUNTER — Other Ambulatory Visit: Payer: Self-pay

## 2019-03-13 ENCOUNTER — Encounter: Payer: Self-pay | Admitting: Interventional Cardiology

## 2019-03-13 ENCOUNTER — Ambulatory Visit (INDEPENDENT_AMBULATORY_CARE_PROVIDER_SITE_OTHER): Payer: Medicare Other | Admitting: Interventional Cardiology

## 2019-03-13 VITALS — BP 128/64 | HR 62 | Ht 64.0 in | Wt 189.2 lb

## 2019-03-13 DIAGNOSIS — I350 Nonrheumatic aortic (valve) stenosis: Secondary | ICD-10-CM | POA: Diagnosis not present

## 2019-03-13 DIAGNOSIS — I5032 Chronic diastolic (congestive) heart failure: Secondary | ICD-10-CM

## 2019-03-13 DIAGNOSIS — I495 Sick sinus syndrome: Secondary | ICD-10-CM

## 2019-03-13 DIAGNOSIS — Z7189 Other specified counseling: Secondary | ICD-10-CM | POA: Diagnosis not present

## 2019-03-13 DIAGNOSIS — I10 Essential (primary) hypertension: Secondary | ICD-10-CM

## 2019-03-13 DIAGNOSIS — I6320 Cerebral infarction due to unspecified occlusion or stenosis of unspecified precerebral arteries: Secondary | ICD-10-CM

## 2019-03-13 DIAGNOSIS — C519 Malignant neoplasm of vulva, unspecified: Secondary | ICD-10-CM

## 2019-03-13 DIAGNOSIS — I493 Ventricular premature depolarization: Secondary | ICD-10-CM

## 2019-03-13 DIAGNOSIS — I4510 Unspecified right bundle-branch block: Secondary | ICD-10-CM | POA: Diagnosis not present

## 2019-03-13 NOTE — Patient Instructions (Addendum)
Medication Instructions:  Your physician recommends that you continue on your current medications as directed. Please refer to the Current Medication list given to you today.  If you need a refill on your cardiac medications before your next appointment, please call your pharmacy.   Lab work: None If you have labs (blood work) drawn today and your tests are completely normal, you will receive your results only by: Marland Kitchen MyChart Message (if you have MyChart) OR . A paper copy in the mail If you have any lab test that is abnormal or we need to change your treatment, we will call you to review the results.  Testing/Procedures: Your physician has requested that you have a cardiac catheterization. Cardiac catheterization is used to diagnose and/or treat various heart conditions. Doctors may recommend this procedure for a number of different reasons. The most common reason is to evaluate chest pain. Chest pain can be a symptom of coronary artery disease (CAD), and cardiac catheterization can show whether plaque is narrowing or blocking your heart's arteries. This procedure is also used to evaluate the valves, as well as measure the blood flow and oxygen levels in different parts of your heart. For further information please visit [facility]uge[facility]iesta[facility]tn. Please follow instruction sheet, as given.   Follow-Up: Your physician recommends that you schedule a follow-up appointment after heart cath.   Any Other Special Instructions Will Be Listed Below (If Applicable).  Once you have more information on the course for your cancer treatment, please contact the office and we will get you set up for the heart cath.

## 2019-03-13 NOTE — Progress Notes (Signed)
Gynecologic Oncology Multi-Disciplinary Disposition Conference Note  Date of the Conference: 03/13/2019  Patient Name: Hailey Cobb  Referring Provider: Dr. Nelda Marseille Primary GYN Oncologist: Dr. Denman George  Stage/Disposition:  Stage IB vulvar cancer. Disposition is to primary radiation. If patient does have cardiac clearance after Dr. Thompson Caul appointment on 03/13/19, she can proceed with surgery.  PET scan scheduled for 03/14/19.  This Multidisciplinary conference took place involving physicians from [place], [place], Radiation Oncology, Pathology, Radiology along with the Gynecologic Oncology Nurse Practitioner and RN.  Comprehensive assessment of the patient's malignancy, staging, need for surgery, chemotherapy, radiation therapy, and need for further testing were reviewed. Supportive measures, both inpatient and following discharge were also discussed. The recommended plan of care is documented. Greater than 35 minutes were spent correlating and coordinating this patient's care.

## 2019-03-14 ENCOUNTER — Ambulatory Visit (HOSPITAL_COMMUNITY)
Admission: RE | Admit: 2019-03-14 | Discharge: 2019-03-14 | Disposition: A | Discharge status: Home / Self Care | Payer: Medicare Other | Source: Ambulatory Visit | Attending: Radiation Oncology | Admitting: Radiation Oncology

## 2019-03-14 DIAGNOSIS — C519 Malignant neoplasm of vulva, unspecified: Secondary | ICD-10-CM | POA: Diagnosis not present

## 2019-03-14 DIAGNOSIS — Z79899 Other long term (current) drug therapy: Secondary | ICD-10-CM | POA: Diagnosis not present

## 2019-03-14 LAB — GLUCOSE, CAPILLARY: Glucose-Capillary: 124 mg/dL — ABNORMAL HIGH (ref 70–99)

## 2019-03-14 MED ORDER — FLUDEOXYGLUCOSE F - 18 (FDG) INJECTION
9.4400 | Freq: Once | INTRAVENOUS | Status: AC | PRN
  Administered 2019-03-14: 9.44 via INTRAVENOUS

## 2019-03-15 ENCOUNTER — Telehealth (INDEPENDENT_AMBULATORY_CARE_PROVIDER_SITE_OTHER): Payer: Medicare Other | Admitting: Interventional Cardiology

## 2019-03-15 ENCOUNTER — Telehealth: Payer: Self-pay | Admitting: Oncology

## 2019-03-15 ENCOUNTER — Telehealth: Payer: Self-pay | Admitting: Interventional Cardiology

## 2019-03-15 ENCOUNTER — Telehealth: Payer: Self-pay | Admitting: *Deleted

## 2019-03-15 DIAGNOSIS — I350 Nonrheumatic aortic (valve) stenosis: Secondary | ICD-10-CM | POA: Diagnosis not present

## 2019-03-15 DIAGNOSIS — I5032 Chronic diastolic (congestive) heart failure: Secondary | ICD-10-CM

## 2019-03-15 NOTE — Telephone Encounter (Signed)
Left a message for Dr. Thompson Caul nurse regarding plan for TAVR, radiation treatment.

## 2019-03-15 NOTE — Telephone Encounter (Signed)
Pt wanted to make sure Dr. Tamala Julian sees her PET scan from yesterday.  Stated I know he is out of the office next week and wanted to make sure he sees before then. Pt aware I will forward this to him.

## 2019-03-15 NOTE — Telephone Encounter (Signed)
Elmo Putt, GYN Nurse Navigator at the [facility] would like to know when the patient is able to start radiation.  The office also wants to know when the patient is to have the TAVR procedure. She was seen by Dr. Tamala Julian on Monday, and would like to speak with Anderson Malta, Dr. Thompson Caul nurse directly.  She can be reached at [phone]. That is a direct number. You can also reach out to her via Epic in basket message as well.

## 2019-03-15 NOTE — Telephone Encounter (Signed)
New Message    Patient is calling because she just had a PET scan yesterday. She was advised to call and let Dr. Tamala Julian know that the results should be available in 24 hours. Please call.

## 2019-03-15 NOTE — Telephone Encounter (Signed)
[name] family member called and stated "Hailey Cobb's aortic stenosis is worse per the cardiologist. She had a PET scan and the results are in the computer. Dr. Tamala Julian wants to know if her has to/need to treat the stenosis first ; or can that be done during radiation." Explained that Dr. Thompson Caul office, Dr. Sondra Come and Santiago Glad RN navigator are already working together on this. [name] asked me if I know what her outcome would be to wait for radiation and the PET results. I explained that I don't have those answers. Explained that we will call her back.

## 2019-03-16 NOTE — Telephone Encounter (Signed)
Dr. Tamala Julian- can you help me with this?  Is she ok to start radiation?  I thought we had talked about doing cath and TAVR work up while doing radiation but I can't remember.

## 2019-03-17 ENCOUNTER — Telehealth: Payer: Self-pay | Admitting: Oncology

## 2019-03-17 NOTE — Telephone Encounter (Signed)
Patient's daughter, [name], called and is wondering what the plan for radiation is and what the PET scan results are.  Advised her of PET scan results and that we have reached out to Dr. Thompson Caul office to see when she would need the cardiac procedure and if that would need to be done before radiation starts.  Advised her that we will call once we have heard from Dr. Thompson Caul office.  Marylyn was also asking what her mother's prognosis is and if radiation would cure the cancer.  Advised her that I will ask Dr. Sondra Come and call her back on Monday.

## 2019-03-17 NOTE — Telephone Encounter (Signed)
I have reviewed the PET scan. Seems like good news. We will move forward with heart evaluation while ration therapy is being done. Expect a call from [facility] to arrange cardiac cath.

## 2019-03-17 NOTE — Telephone Encounter (Signed)
Spoke with pt and her daughter and went over plan.  Advised once I hear back about radiation, I will be in contact to get cath set up.  Both were appreciative for the call.

## 2019-03-17 NOTE — Telephone Encounter (Signed)
Start radiation. We can be doing the TAVR eval while getting radiation. TAVR probably won't be done until radiation completed.

## 2019-03-20 ENCOUNTER — Telehealth: Payer: Self-pay

## 2019-03-20 ENCOUNTER — Telehealth: Payer: Self-pay | Admitting: Oncology

## 2019-03-20 NOTE — Telephone Encounter (Signed)
New Message    Patient's daughter calling you back to dicuss radiations please call her back.

## 2019-03-20 NOTE — Telephone Encounter (Addendum)
Called Hailey Cobb back and advised her that Dr. Denman George is recommending that [facility] start radiation and that she does not have a guarantee on how fast the cancer would spread but since she has the cancer diagnosis she should start radiation treatments.  Hailey Cobb verbalized understanding and agreement.

## 2019-03-20 NOTE — Telephone Encounter (Signed)
Spoke with patients daughter she has multiple questions due to a bad report from the cardiologist. Un fortunately he is out of town this week so we can not have Dr Sondra Come talk with him. There is some question as to wether to fix her heart or do radiation. After speaking with the daughter she is going to request more info from the cardiologist prior to making a decision on start of radiation.

## 2019-03-20 NOTE — Telephone Encounter (Signed)
Called [name] and discussed that the plan is to start radiation treatments and to have the planning angiogram and workup done during radiation.  Advised her that Dr. Sondra Come is going to have the CT Sim scheduled soon and that radiation usually starts 7 days afterwards.  She verbalized agreement and then asked how severe the cancer is and how important is it for her to start radiation right away.  Advised her that Dr. Sondra Come will be contacted and we will call her back.

## 2019-03-20 NOTE — Telephone Encounter (Signed)
Spoke with daughter and she states she spoke with a nurse at oncology and her understanding was they wanted pt to go through the TAVR process before starting radiation.  I called Santiago Glad, RN and spoke with her and she said no, pt was going to be set up for radiation and she would let me know when that was so that we could get her set up for the cath.

## 2019-03-23 ENCOUNTER — Telehealth: Payer: Self-pay | Admitting: Interventional Cardiology

## 2019-03-23 NOTE — Telephone Encounter (Signed)
New Message   Pt c/o Shortness Of Breath: STAT if SOB developed within the last 24 hours or pt is noticeably SOB on the phone  1. Are you currently SOB (can you hear that pt is SOB on the phone)? I did not speak with patient I spoke with the patients dtr who states that patient is complaining of shortness of breath  2. How long have you been experiencing SOB? 2 days   3. Are you SOB when sitting or when up moving around? Sitting up and moving around  4. Are you currently experiencing any other symptoms? So chills and abdominal pain.

## 2019-03-23 NOTE — Telephone Encounter (Signed)
Spoke with daughter and pt has been SOB x 2 days. It is constant and worse when lying down.  Pt hasn't been able to rest the last 2 days.  Has gas and has coughed all night.  Daughter is not aware of any swelling but unsure as she wasn't with the pt at the time.  Pt did tell daughter she was not having CP but it "felt like a boil" under her breast.  Spoke with Dr. Tamala Julian and he said to increase Furosemide to 40mg  once daily and monitor BP.  Contact the office if dizzy or lightheaded and we may have to back off of the Amlodipine.  Spoke with daughter and went over recommendations.  Daughter verbalized understanding and was appreciative for call.

## 2019-03-24 NOTE — Telephone Encounter (Signed)
Pt will start radiation on 8/19.  Spoke with daughter this morning and they would like to do cath on 8/20 if possible, if not 8/27 is fine.  Advised I would get everything set up and contact her next week to go over everything.  Daughter appreciative for call.    I have reached out to Santiago Glad with oncology to see how long pt would be in radiation so we can get timing right for cath.

## 2019-03-27 ENCOUNTER — Other Ambulatory Visit: Payer: Self-pay

## 2019-03-27 ENCOUNTER — Ambulatory Visit
Admission: RE | Admit: 2019-03-27 | Discharge: 2019-03-27 | Disposition: A | Discharge status: Home / Self Care | Payer: Medicare Other | Source: Ambulatory Visit | Attending: Radiation Oncology | Admitting: Radiation Oncology

## 2019-03-27 DIAGNOSIS — C519 Malignant neoplasm of vulva, unspecified: Secondary | ICD-10-CM | POA: Diagnosis not present

## 2019-03-27 DIAGNOSIS — Z510 Encounter for antineoplastic radiation therapy: Secondary | ICD-10-CM | POA: Insufficient documentation

## 2019-03-29 NOTE — Telephone Encounter (Signed)
Spoke with daughter and she was agreeable to bring pt by the office on Friday for updated EKG and to go over cath instructions and get labs.

## 2019-03-29 NOTE — Telephone Encounter (Signed)

## 2019-03-31 ENCOUNTER — Other Ambulatory Visit: Payer: Self-pay

## 2019-03-31 ENCOUNTER — Other Ambulatory Visit: Payer: Medicare Other | Admitting: *Deleted

## 2019-03-31 ENCOUNTER — Encounter: Payer: Self-pay | Admitting: *Deleted

## 2019-03-31 DIAGNOSIS — I5032 Chronic diastolic (congestive) heart failure: Secondary | ICD-10-CM | POA: Diagnosis not present

## 2019-03-31 DIAGNOSIS — I350 Nonrheumatic aortic (valve) stenosis: Secondary | ICD-10-CM

## 2019-03-31 LAB — BASIC METABOLIC PANEL
BUN/Creatinine Ratio: 19 (ref 12–28)
BUN: 15 mg/dL (ref 8–27)
CO2: 24 mmol/L (ref 20–29)
Calcium: 10 mg/dL (ref 8.7–10.3)
Chloride: 89 mmol/L — ABNORMAL LOW (ref 96–106)
Creatinine, Ser: 0.79 mg/dL (ref 0.57–1.00)
GFR calc Af Amer: 79 mL/min/{1.73_m2} (ref >59)
GFR calc non Af Amer: 68 mL/min/{1.73_m2} (ref >59)
Glucose: 108 mg/dL — ABNORMAL HIGH (ref 65–99)
Potassium: 4.9 mmol/L (ref 3.5–5.2)
Sodium: 127 mmol/L — ABNORMAL LOW (ref 134–144)

## 2019-03-31 LAB — CBC
Hematocrit: 34.9 % (ref 34.0–46.6)
Hemoglobin: 11.4 g/dL (ref 11.1–15.9)
MCH: 28.1 pg (ref 26.6–33.0)
MCHC: 32.7 g/dL (ref 31.5–35.7)
MCV: 86 fL (ref 79–97)
Platelets: 152 10*3/uL (ref 150–450)
RBC: 4.06 x10E6/uL (ref 3.77–5.28)
RDW: 13.3 % (ref 11.7–15.4)
WBC: 3.8 10*3/uL (ref 3.4–10.8)

## 2019-03-31 NOTE — Telephone Encounter (Signed)
Pt and daughter in today for labs and EKG for cath.  Went over cath instructions.  Both verbalized understanding.

## 2019-03-31 NOTE — Addendum Note (Signed)
Addended by: Loren Racer on: 03/31/2019 11:01 AM   Modules accepted: Orders

## 2019-04-03 ENCOUNTER — Telehealth: Payer: Self-pay | Admitting: Interventional Cardiology

## 2019-04-03 NOTE — Telephone Encounter (Signed)
Sent result note to Dr. Tamala Julian letting him know pt's breathing is better.

## 2019-04-03 NOTE — Telephone Encounter (Signed)
New Message   Patient's daughter returning call. States that patient's breathing is better. Please give her a call back if anything else is needed.

## 2019-04-04 DIAGNOSIS — Z510 Encounter for antineoplastic radiation therapy: Secondary | ICD-10-CM | POA: Diagnosis not present

## 2019-04-04 DIAGNOSIS — C519 Malignant neoplasm of vulva, unspecified: Secondary | ICD-10-CM | POA: Diagnosis not present

## 2019-04-05 ENCOUNTER — Other Ambulatory Visit: Payer: Self-pay

## 2019-04-05 ENCOUNTER — Ambulatory Visit
Admission: RE | Admit: 2019-04-05 | Discharge: 2019-04-05 | Disposition: A | Discharge status: Home / Self Care | Payer: Medicare Other | Source: Ambulatory Visit | Attending: Radiation Oncology | Admitting: Radiation Oncology

## 2019-04-05 ENCOUNTER — Other Ambulatory Visit (HOSPITAL_COMMUNITY)
Admission: RE | Admit: 2019-04-05 | Discharge: 2019-04-05 | Disposition: A | Discharge status: Home / Self Care | Payer: Medicare Other | Source: Ambulatory Visit | Attending: Interventional Cardiology | Admitting: Interventional Cardiology

## 2019-04-05 ENCOUNTER — Telehealth: Payer: Self-pay | Admitting: *Deleted

## 2019-04-05 DIAGNOSIS — Z01812 Encounter for preprocedural laboratory examination: Secondary | ICD-10-CM | POA: Insufficient documentation

## 2019-04-05 DIAGNOSIS — Z510 Encounter for antineoplastic radiation therapy: Secondary | ICD-10-CM | POA: Diagnosis not present

## 2019-04-05 DIAGNOSIS — Z20828 Contact with and (suspected) exposure to other viral communicable diseases: Secondary | ICD-10-CM | POA: Diagnosis not present

## 2019-04-05 DIAGNOSIS — C519 Malignant neoplasm of vulva, unspecified: Secondary | ICD-10-CM | POA: Diagnosis not present

## 2019-04-05 LAB — SARS CORONAVIRUS 2 (TAT 6-24 HRS): SARS Coronavirus 2: NEGATIVE

## 2019-04-05 NOTE — Telephone Encounter (Addendum)
Pt contacted pre-catheterization scheduled at [facility] for: Thursday April 06, 2019 1:30 PM Verified arrival time and place: [facility]) at: 11:30 AM Pt has radiation at the [facility] at 11 AM,pt states usually takes about 15 minutes, then will go to [facility] for Cardiac Cath.   No solid food after midnight prior to cath, clear liquids until 5 AM day of procedure. Contrast allergy: no  Hold: Metformin-day of procedure and 48 hours post procedure. Januvia-AM of procedure. Lasix-AM of procedure.  Except hold medications AM meds can be  taken pre-cath with sip of water including: ASA 81 mg   Confirmed patient has responsible person to drive home post procedure and observe 24 hours after arriving home: yes    Currently, due to Covid-19 pandemic, only one support person will be allowed with patient. Must be the same support person for that patient's entire stay, will be screened and required to wear a mask. They will be asked to wait in the waiting room for the duration of the patient's stay.  Patients are required to wear a mask when they enter the hospital.       COVID-19 Pre-Screening Questions:  . In the past 7 to 10 days have you had a cough,  shortness of breath, headache, congestion, fever (100 or greater) body aches, chills, sore throat, or sudden loss of taste or sense of smell? no . Have you been around anyone with known Covid 19? no . Have you been around anyone who is awaiting Covid 19 test results in the past 7 to 10 days? no . Have you been around anyone who has been exposed to Covid 19, or has mentioned symptoms of Covid 19 within the past 7 to 10 days? no  I reviewed procedure/mask/visitor instructions, Covid-19 screening questions with patient, she verbalized understanding.    Pt knows to self quarantine after Covid-19 test. For necessary medical appointments, pt knows to wear mask, wash hands, practice social distance as much  as possible.

## 2019-04-05 NOTE — Progress Notes (Signed)
  Radiation Oncology         [phone] ________________________________  Name: Hailey Cobb MRN: [id number]  Date: 04/05/2019  DOB: January 30, 1932  Simulation Verification Note    ICD-10-CM   1. 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 The clinical setup was verified. Then port films were obtained and uploaded to the radiation oncology medical record software.  The treatment beams were carefully compared against the planned radiation fields. The position location and shape of the radiation fields was reviewed. They targeted volume of tissue appears to be appropriately covered by the radiation beams. Organs at risk appear to be excluded as planned.  Based on my personal review, I approved the simulation verification. The patient's treatment will proceed as planned.  -----------------------------------  Blair Promise, PhD, MD

## 2019-04-05 NOTE — Telephone Encounter (Signed)
Duplicate/error

## 2019-04-06 ENCOUNTER — Encounter (HOSPITAL_COMMUNITY)
Admission: RE | Disposition: A | Discharge status: Home / Self Care | Payer: Medicare Other | Source: Home / Self Care | Attending: Interventional Cardiology

## 2019-04-06 ENCOUNTER — Other Ambulatory Visit: Payer: Self-pay

## 2019-04-06 ENCOUNTER — Ambulatory Visit (HOSPITAL_COMMUNITY)
Admission: RE | Admit: 2019-04-06 | Discharge: 2019-04-06 | Disposition: A | Discharge status: Home / Self Care | Payer: Medicare Other | Attending: Interventional Cardiology | Admitting: Interventional Cardiology

## 2019-04-06 ENCOUNTER — Ambulatory Visit
Admission: RE | Admit: 2019-04-06 | Discharge: 2019-04-06 | Disposition: A | Discharge status: Home / Self Care | Payer: Medicare Other | Source: Ambulatory Visit | Attending: Radiation Oncology | Admitting: Radiation Oncology

## 2019-04-06 DIAGNOSIS — I350 Nonrheumatic aortic (valve) stenosis: Secondary | ICD-10-CM | POA: Insufficient documentation

## 2019-04-06 DIAGNOSIS — I110 Hypertensive heart disease with heart failure: Secondary | ICD-10-CM | POA: Diagnosis not present

## 2019-04-06 DIAGNOSIS — R011 Cardiac murmur, unspecified: Secondary | ICD-10-CM | POA: Insufficient documentation

## 2019-04-06 DIAGNOSIS — E119 Type 2 diabetes mellitus without complications: Secondary | ICD-10-CM

## 2019-04-06 DIAGNOSIS — F419 Anxiety disorder, unspecified: Secondary | ICD-10-CM | POA: Insufficient documentation

## 2019-04-06 DIAGNOSIS — I495 Sick sinus syndrome: Secondary | ICD-10-CM | POA: Diagnosis not present

## 2019-04-06 DIAGNOSIS — I5033 Acute on chronic diastolic (congestive) heart failure: Secondary | ICD-10-CM | POA: Diagnosis present

## 2019-04-06 DIAGNOSIS — R000 Tachycardia, unspecified: Secondary | ICD-10-CM | POA: Insufficient documentation

## 2019-04-06 DIAGNOSIS — R001 Bradycardia, unspecified: Secondary | ICD-10-CM | POA: Diagnosis not present

## 2019-04-06 DIAGNOSIS — Z510 Encounter for antineoplastic radiation therapy: Secondary | ICD-10-CM | POA: Diagnosis not present

## 2019-04-06 DIAGNOSIS — I4510 Unspecified right bundle-branch block: Secondary | ICD-10-CM | POA: Insufficient documentation

## 2019-04-06 DIAGNOSIS — E039 Hypothyroidism, unspecified: Secondary | ICD-10-CM | POA: Diagnosis not present

## 2019-04-06 DIAGNOSIS — I10 Essential (primary) hypertension: Secondary | ICD-10-CM | POA: Diagnosis present

## 2019-04-06 DIAGNOSIS — E7800 Pure hypercholesterolemia, unspecified: Secondary | ICD-10-CM | POA: Insufficient documentation

## 2019-04-06 DIAGNOSIS — I2510 Atherosclerotic heart disease of native coronary artery without angina pectoris: Secondary | ICD-10-CM

## 2019-04-06 DIAGNOSIS — Z79899 Other long term (current) drug therapy: Secondary | ICD-10-CM | POA: Diagnosis not present

## 2019-04-06 DIAGNOSIS — K589 Irritable bowel syndrome without diarrhea: Secondary | ICD-10-CM | POA: Diagnosis not present

## 2019-04-06 DIAGNOSIS — C519 Malignant neoplasm of vulva, unspecified: Secondary | ICD-10-CM | POA: Insufficient documentation

## 2019-04-06 DIAGNOSIS — K219 Gastro-esophageal reflux disease without esophagitis: Secondary | ICD-10-CM | POA: Insufficient documentation

## 2019-04-06 DIAGNOSIS — Z7984 Long term (current) use of oral hypoglycemic drugs: Secondary | ICD-10-CM | POA: Diagnosis not present

## 2019-04-06 DIAGNOSIS — M1990 Unspecified osteoarthritis, unspecified site: Secondary | ICD-10-CM | POA: Diagnosis not present

## 2019-04-06 DIAGNOSIS — I272 Pulmonary hypertension, unspecified: Secondary | ICD-10-CM | POA: Insufficient documentation

## 2019-04-06 DIAGNOSIS — I5032 Chronic diastolic (congestive) heart failure: Secondary | ICD-10-CM | POA: Diagnosis not present

## 2019-04-06 DIAGNOSIS — S065XAA Traumatic subdural hemorrhage with loss of consciousness status unknown, initial encounter: Secondary | ICD-10-CM | POA: Diagnosis present

## 2019-04-06 DIAGNOSIS — I639 Cerebral infarction, unspecified: Secondary | ICD-10-CM | POA: Diagnosis present

## 2019-04-06 DIAGNOSIS — S065X9A Traumatic subdural hemorrhage with loss of consciousness of unspecified duration, initial encounter: Secondary | ICD-10-CM | POA: Diagnosis present

## 2019-04-06 HISTORY — PX: RIGHT/LEFT HEART CATH AND CORONARY ANGIOGRAPHY: CATH118266

## 2019-04-06 LAB — POCT I-STAT EG7
Bicarbonate: 25.4 mmol/L (ref 20.0–28.0)
Bicarbonate: 25.7 mmol/L (ref 20.0–28.0)
Calcium, Ion: 1.34 mmol/L (ref 1.15–1.40)
Calcium, Ion: 1.34 mmol/L (ref 1.15–1.40)
HCT: 35 % — ABNORMAL LOW (ref 36.0–46.0)
HCT: 35 % — ABNORMAL LOW (ref 36.0–46.0)
Hemoglobin: 11.9 g/dL — ABNORMAL LOW (ref 12.0–15.0)
Hemoglobin: 11.9 g/dL — ABNORMAL LOW (ref 12.0–15.0)
O2 Saturation: 78 %
O2 Saturation: 79 %
Potassium: 4 mmol/L (ref 3.5–5.1)
Potassium: 4.1 mmol/L (ref 3.5–5.1)
Sodium: 131 mmol/L — ABNORMAL LOW (ref 135–145)
Sodium: 132 mmol/L — ABNORMAL LOW (ref 135–145)
TCO2: 27 mmol/L (ref 22–32)
TCO2: 27 mmol/L (ref 22–32)
pCO2, Ven: 44.5 mmHg (ref 44.0–60.0)
pCO2, Ven: 44.5 mmHg (ref 44.0–60.0)
pH, Ven: 7.364 (ref 7.250–7.430)
pH, Ven: 7.37 (ref 7.250–7.430)
pO2, Ven: 44 mmHg (ref 32.0–45.0)
pO2, Ven: 45 mmHg (ref 32.0–45.0)

## 2019-04-06 LAB — POCT ACTIVATED CLOTTING TIME: Activated Clotting Time: 213 seconds

## 2019-04-06 LAB — POCT I-STAT 7, (LYTES, BLD GAS, ICA,H+H)
Acid-Base Excess: 1 mmol/L (ref 0.0–2.0)
Bicarbonate: 26.4 mmol/L (ref 20.0–28.0)
Calcium, Ion: 1.31 mmol/L (ref 1.15–1.40)
HCT: 34 % — ABNORMAL LOW (ref 36.0–46.0)
Hemoglobin: 11.6 g/dL — ABNORMAL LOW (ref 12.0–15.0)
O2 Saturation: 99 %
Potassium: 4 mmol/L (ref 3.5–5.1)
Sodium: 131 mmol/L — ABNORMAL LOW (ref 135–145)
TCO2: 28 mmol/L (ref 22–32)
pCO2 arterial: 44 mmHg (ref 32.0–48.0)
pH, Arterial: 7.386 (ref 7.350–7.450)
pO2, Arterial: 127 mmHg — ABNORMAL HIGH (ref 83.0–108.0)

## 2019-04-06 LAB — GLUCOSE, CAPILLARY
Glucose-Capillary: 105 mg/dL — ABNORMAL HIGH (ref 70–99)
Glucose-Capillary: 113 mg/dL — ABNORMAL HIGH (ref 70–99)

## 2019-04-06 SURGERY — RIGHT/LEFT HEART CATH AND CORONARY ANGIOGRAPHY
Anesthesia: LOCAL

## 2019-04-06 MED ORDER — SODIUM CHLORIDE 0.9% FLUSH: 3.0000 mL | INTRAVENOUS | Status: DC | PRN

## 2019-04-06 MED ORDER — SODIUM CHLORIDE 0.9 % WEIGHT BASED INFUSION: 1.0000 mL/kg/h | INTRAVENOUS | Status: DC

## 2019-04-06 MED ORDER — HYDRALAZINE HCL 20 MG/ML IJ SOLN: 10.0000 mg | INTRAMUSCULAR | Status: DC | PRN

## 2019-04-06 MED ORDER — ONDANSETRON HCL 4 MG/2ML IJ SOLN: 4.0000 mg | Freq: Four times a day (QID) | INTRAMUSCULAR | Status: DC | PRN

## 2019-04-06 MED ORDER — OXYCODONE HCL 5 MG PO TABS: 5.0000 mg | ORAL_TABLET | ORAL | Status: DC | PRN

## 2019-04-06 MED ORDER — LIDOCAINE HCL (PF) 1 % IJ SOLN
INTRAMUSCULAR | Status: DC | PRN
  Administered 2019-04-06 (×3): 2 mL
  Administered 2019-04-06: 15 mL

## 2019-04-06 MED ORDER — HEPARIN (PORCINE) IN NACL 1000-0.9 UT/500ML-% IV SOLN
INTRAVENOUS | Status: AC
  Filled 2019-04-06: qty 500

## 2019-04-06 MED ORDER — FENTANYL CITRATE (PF) 100 MCG/2ML IJ SOLN
INTRAMUSCULAR | Status: DC | PRN
  Administered 2019-04-06: 25 ug via INTRAVENOUS

## 2019-04-06 MED ORDER — SODIUM CHLORIDE 0.9 % WEIGHT BASED INFUSION
3.0000 mL/kg/h | INTRAVENOUS | Status: AC
  Administered 2019-04-06: 3 mL/kg/h via INTRAVENOUS

## 2019-04-06 MED ORDER — ATORVASTATIN CALCIUM 80 MG PO TABS: 80.0000 mg | ORAL_TABLET | Freq: Every day | ORAL | Status: DC

## 2019-04-06 MED ORDER — VERAPAMIL HCL 2.5 MG/ML IV SOLN
INTRAVENOUS | Status: DC | PRN
  Administered 2019-04-06: 10 mL via INTRA_ARTERIAL

## 2019-04-06 MED ORDER — HEPARIN SODIUM (PORCINE) 1000 UNIT/ML IJ SOLN
INTRAMUSCULAR | Status: AC
  Filled 2019-04-06: qty 1

## 2019-04-06 MED ORDER — MIDAZOLAM HCL 2 MG/2ML IJ SOLN
INTRAMUSCULAR | Status: AC
  Filled 2019-04-06: qty 2

## 2019-04-06 MED ORDER — IOHEXOL 350 MG/ML SOLN
INTRAVENOUS | Status: DC | PRN
  Administered 2019-04-06: 55 mL via INTRACARDIAC

## 2019-04-06 MED ORDER — SODIUM CHLORIDE 0.9 % IV SOLN: INTRAVENOUS | Status: DC

## 2019-04-06 MED ORDER — HEPARIN (PORCINE) IN NACL 1000-0.9 UT/500ML-% IV SOLN
INTRAVENOUS | Status: AC
  Filled 2019-04-06: qty 1000

## 2019-04-06 MED ORDER — ASPIRIN 81 MG PO CHEW: 81.0000 mg | CHEWABLE_TABLET | Freq: Every day | ORAL | Status: DC

## 2019-04-06 MED ORDER — FENTANYL CITRATE (PF) 100 MCG/2ML IJ SOLN
INTRAMUSCULAR | Status: AC
  Filled 2019-04-06: qty 2

## 2019-04-06 MED ORDER — SODIUM CHLORIDE 0.9 % IV SOLN: 250.0000 mL | INTRAVENOUS | Status: DC | PRN

## 2019-04-06 MED ORDER — ACETAMINOPHEN 325 MG PO TABS: 650.0000 mg | ORAL_TABLET | ORAL | Status: DC | PRN

## 2019-04-06 MED ORDER — ASPIRIN 81 MG PO CHEW: 81.0000 mg | CHEWABLE_TABLET | ORAL | Status: DC

## 2019-04-06 MED ORDER — HEPARIN (PORCINE) IN NACL 1000-0.9 UT/500ML-% IV SOLN
INTRAVENOUS | Status: DC | PRN
  Administered 2019-04-06: 500 mL

## 2019-04-06 MED ORDER — LIDOCAINE HCL (PF) 1 % IJ SOLN
INTRAMUSCULAR | Status: AC
  Filled 2019-04-06: qty 30

## 2019-04-06 MED ORDER — SODIUM CHLORIDE 0.9% FLUSH: 3.0000 mL | Freq: Two times a day (BID) | INTRAVENOUS | Status: DC

## 2019-04-06 MED ORDER — VERAPAMIL HCL 2.5 MG/ML IV SOLN
INTRAVENOUS | Status: AC
  Filled 2019-04-06: qty 2

## 2019-04-06 MED ORDER — HEPARIN SODIUM (PORCINE) 1000 UNIT/ML IJ SOLN
INTRAMUSCULAR | Status: DC | PRN
  Administered 2019-04-06: 4000 [IU] via INTRAVENOUS

## 2019-04-06 MED ORDER — MIDAZOLAM HCL 2 MG/2ML IJ SOLN
INTRAMUSCULAR | Status: DC | PRN
  Administered 2019-04-06 (×2): 0.5 mg via INTRAVENOUS

## 2019-04-06 SURGICAL SUPPLY — 17 items
CATH 5FR JL3.5 JR4 ANG PIG MP (CATHETERS) ×1 IMPLANT
CATH BALLN WEDGE 5F 110CM (CATHETERS) ×1 IMPLANT
CATH SWAN GANZ 7F STRAIGHT (CATHETERS) ×1 IMPLANT
ELECT DEFIB PAD ADLT CADENCE (PAD) ×1 IMPLANT
GLIDESHEATH SLEND A-KIT 6F 22G (SHEATH) ×1 IMPLANT
GUIDEWIRE .025 260CM (WIRE) ×1 IMPLANT
GUIDEWIRE INQWIRE 1.5J.035X260 (WIRE) IMPLANT
INQWIRE 1.5J .035X260CM (WIRE) ×2
KIT HEART LEFT (KITS) ×2 IMPLANT
PACK CARDIAC CATHETERIZATION (CUSTOM PROCEDURE TRAY) ×2 IMPLANT
SHEATH GLIDE SLENDER 4/5FR (SHEATH) ×2 IMPLANT
SHEATH PINNACLE 7F 10CM (SHEATH) ×1 IMPLANT
SHEATH PROBE COVER 6X72 (BAG) ×1 IMPLANT
TRANSDUCER W/STOPCOCK (MISCELLANEOUS) ×2 IMPLANT
TUBING CIL FLEX 10 FLL-RA (TUBING) ×2 IMPLANT
WIRE EMERALD ST .035X150CM (WIRE) ×1 IMPLANT
WIRE HI TORQ VERSACORE-J 145CM (WIRE) ×1 IMPLANT

## 2019-04-06 NOTE — Interval H&P Note (Signed)
Cath Lab Visit (complete for each Cath Lab visit)  Clinical Evaluation Leading to the Procedure:   ACS: No.  Non-ACS:    Anginal Classification: CCS III  Anti-ischemic medical therapy: Minimal Therapy (1 class of medications)  Non-Invasive Test Results: No non-invasive testing performed  Prior CABG: No previous CABG      History and Physical Interval Note:  04/06/2019 1:51 PM  Hailey Cobb  has presented today for surgery, with the diagnosis of Aortic stenosis.  The various methods of treatment have been discussed with the patient and family. After consideration of risks, benefits and other options for treatment, the patient has consented to  Procedure(s): RIGHT/LEFT HEART CATH AND CORONARY ANGIOGRAPHY (N/A) as a surgical intervention.  The patient's history has been reviewed, patient examined, no change in status, stable for surgery.  I have reviewed the patient's chart and labs.  Questions were answered to the patient's satisfaction.     Belva Crome III

## 2019-04-06 NOTE — Discharge Instructions (Signed)
Groin Site Care This sheet gives you information about how to care for yourself after your procedure. Your health care provider may also give you more specific instructions. If you have problems or questions, contact your health care provider. What can I expect after the procedure? After the procedure, it is common to have:  Bruising that usually fades within 1-2 weeks.  Tenderness at the site. Follow these instructions at home: Wound care  Follow instructions from your health care provider about how to take care of your insertion site. Make sure you: ? Wash your hands with soap and water before you change your bandage (dressing). If soap and water are not available, use hand sanitizer. ? Change your dressing as told by your health care provider. ? Leave stitches (sutures), skin glue, or adhesive strips in place. These skin closures may need to stay in place for 2 weeks or longer. If adhesive strip edges start to loosen and curl up, you may trim the loose edges. Do not remove adhesive strips completely unless your health care provider tells you to do that.  Do not take baths, swim, or use a hot tub until your health care provider approves.  You may shower 24-48 hours after the procedure or as told by your health care provider. ? Gently wash the site with plain soap and water. ? Pat the area dry with a clean towel. ? Do not rub the site. This may cause bleeding.  Do not apply powder or lotion to the site. Keep the site clean and dry.  Check your femoral site every day for signs of infection. Check for: ? Redness, swelling, or pain. ? Fluid or blood. ? Warmth. ? Pus or a bad smell. Activity  For the first 2-3 days after your procedure, or as long as directed: ? Avoid climbing stairs as much as possible. ? Do not squat.  Do not lift anything that is heavier than 10 lb (4.5 kg), or the limit that you are told, until your health care provider says that it is safe.  Rest as  directed. ? Avoid sitting for a long time without moving. Get up to take short walks every 1-2 hours.  Do not drive for 24 hours if you were given a medicine to help you relax (sedative). General instructions  Take over-the-counter and prescription medicines only as told by your health care provider.  Keep all follow-up visits as told by your health care provider. This is important. Contact a health care provider if you have:  A fever or chills.  You have redness, swelling, or pain around your insertion site. Get help right away if:  The catheter insertion area swells very fast.  You pass out.  You suddenly start to sweat or your skin gets clammy.  The catheter insertion area is bleeding, and the bleeding does not stop when you hold steady pressure on the area.  The area near or just beyond the catheter insertion site becomes pale, cool, tingly, or numb. These symptoms may represent a serious problem that is an emergency. Do not wait to see if the symptoms will go away. Get medical help right away. Call your local emergency services (911 in the [place] Do not drive yourself to the hospital. Summary  After the procedure, it is common to have bruising that usually fades within 1-2 weeks.  Check your femoral site every day for signs of infection.  Do not lift anything that is heavier than 10 lb (4.5 kg), or the  limit that you are told, until your health care provider says that it is safe. This information is not intended to replace advice given to you by your health care provider. Make sure you discuss any questions you have with your health care provider. Document Released: 04/06/2014 Document Revised: 08/16/2017 Document Reviewed: 08/16/2017 Elsevier Patient Education  2020 [employer] [employer]  This sheet gives you information about how to care for yourself after your procedure. Your health care provider may also give you more specific instructions. If you have  problems or questions, contact your health care provider. What can I expect after the procedure? After the procedure, it is common to have:  Bruising and tenderness at the catheter insertion area. Follow these instructions at home: Medicines  Take over-the-counter and prescription medicines only as told by your health care provider. Insertion site care  Follow instructions from your health care provider about how to take care of your insertion site. Make sure you: ? Wash your hands with soap and water before you change your bandage (dressing). If soap and water are not available, use hand sanitizer. ? Change your dressing as told by your health care provider. ? Leave stitches (sutures), skin glue, or adhesive strips in place. These skin closures may need to stay in place for 2 weeks or longer. If adhesive strip edges start to loosen and curl up, you may trim the loose edges. Do not remove adhesive strips completely unless your health care provider tells you to do that.  Check your insertion site every day for signs of infection. Check for: ? Redness, swelling, or pain. ? Fluid or blood. ? Pus or a bad smell. ? Warmth.  Do not take baths, swim, or use a hot tub until your health care provider approves.  You may shower 24-48 hours after the procedure, or as directed by your health care provider. ? Remove the dressing and gently wash the site with plain soap and water. ? Pat the area dry with a clean towel. ? Do not rub the site. That could cause bleeding.  Do not apply powder or lotion to the site. Activity   For 24 hours after the procedure, or as directed by your health care provider: ? Do not flex or bend the affected arm. ? Do not push or pull heavy objects with the affected arm. ? Do not drive yourself home from the hospital or clinic. You may drive 24 hours after the procedure unless your health care provider tells you not to. ? Do not operate machinery or power tools.  Do  not lift anything that is heavier than 10 lb (4.5 kg), or the limit that you are told, until your health care provider says that it is safe.  Ask your health care provider when it is okay to: ? Return to work or school. ? Resume usual physical activities or sports. ? Resume sexual activity. General instructions  If the catheter site starts to bleed, raise your arm and put firm pressure on the site. If the bleeding does not stop, get help right away. This is a medical emergency.  If you went home on the same day as your procedure, a responsible adult should be with you for the first 24 hours after you arrive home.  Keep all follow-up visits as told by your health care provider. This is important. Contact a health care provider if:  You have a fever.  You have redness, swelling, or yellow drainage around your  insertion site. Get help right away if:  You have unusual pain at the radial site.  The catheter insertion area swells very fast.  The insertion area is bleeding, and the bleeding does not stop when you hold steady pressure on the area.  Your arm or hand becomes pale, cool, tingly, or numb. These symptoms may represent a serious problem that is an emergency. Do not wait to see if the symptoms will go away. Get medical help right away. Call your local emergency services (911 in the [place] Do not drive yourself to the hospital. Summary  After the procedure, it is common to have bruising and tenderness at the site.  Follow instructions from your health care provider about how to take care of your radial site wound. Check the wound every day for signs of infection.  Do not lift anything that is heavier than 10 lb (4.5 kg), or the limit that you are told, until your health care provider says that it is safe. This information is not intended to replace advice given to you by your health care provider. Make sure you discuss any questions you have with your health care  provider. Document Released: 09/05/2010 Document Revised: 09/08/2017 Document Reviewed: 09/08/2017 Elsevier Patient Education  2020 [employer].

## 2019-04-06 NOTE — CV Procedure (Signed)
   Patient with severe aortic stenosis presenting for left and right heart cath with coronary angiography.  Left heart cath performed via right radial using real-time vascular ultrasound for radial artery access.  Attempted antecubital venous access in both arms but unable to get up to the subclavian from either side.  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  Ultimately the right femoral vein was used with access via real-time vascular ultrasound.  Left main is normal  LAD contains eccentric 50% proximal stenosis.  LAD beyond the diagonal contains 30% narrowing  Circumflex gives 3 obtuse marginals and in the mid vessel there is 30% narrowing.  Right coronary is dominant and there is no significant obstruction.  Mild pulmonary hypertension with mean PA pressure 24 mmHg.  Pulmonary capillary wedge pressure 10 mmHg  Transaortic valve gradient peak to peak 79 mmHg and mean aortic valve gradient 66 mmHg  Aortic valve area 0.77 cm based on cardiac output of 7.8 L/min  Plan valve clinic referral for TAVR.

## 2019-04-07 ENCOUNTER — Other Ambulatory Visit: Payer: Self-pay

## 2019-04-07 ENCOUNTER — Encounter (HOSPITAL_COMMUNITY): Payer: Self-pay | Admitting: Interventional Cardiology

## 2019-04-07 ENCOUNTER — Other Ambulatory Visit (HOSPITAL_COMMUNITY): Payer: Medicare Other

## 2019-04-07 ENCOUNTER — Ambulatory Visit
Admission: RE | Admit: 2019-04-07 | Discharge: 2019-04-07 | Disposition: A | Discharge status: Home / Self Care | Payer: Medicare Other | Source: Ambulatory Visit | Attending: Radiation Oncology | Admitting: Radiation Oncology

## 2019-04-07 DIAGNOSIS — Z510 Encounter for antineoplastic radiation therapy: Secondary | ICD-10-CM | POA: Diagnosis not present

## 2019-04-07 DIAGNOSIS — C519 Malignant neoplasm of vulva, unspecified: Secondary | ICD-10-CM | POA: Diagnosis not present

## 2019-04-10 ENCOUNTER — Other Ambulatory Visit: Payer: Self-pay

## 2019-04-10 ENCOUNTER — Ambulatory Visit
Admission: RE | Admit: 2019-04-10 | Discharge: 2019-04-10 | Disposition: A | Discharge status: Home / Self Care | Payer: Medicare Other | Source: Ambulatory Visit | Attending: Radiation Oncology | Admitting: Radiation Oncology

## 2019-04-10 DIAGNOSIS — Z510 Encounter for antineoplastic radiation therapy: Secondary | ICD-10-CM | POA: Diagnosis not present

## 2019-04-10 DIAGNOSIS — C519 Malignant neoplasm of vulva, unspecified: Secondary | ICD-10-CM | POA: Diagnosis not present

## 2019-04-11 ENCOUNTER — Other Ambulatory Visit: Payer: Self-pay

## 2019-04-11 ENCOUNTER — Ambulatory Visit
Admission: RE | Admit: 2019-04-11 | Discharge: 2019-04-11 | Disposition: A | Discharge status: Home / Self Care | Payer: Medicare Other | Source: Ambulatory Visit | Attending: Radiation Oncology | Admitting: Radiation Oncology

## 2019-04-11 DIAGNOSIS — C519 Malignant neoplasm of vulva, unspecified: Secondary | ICD-10-CM | POA: Diagnosis not present

## 2019-04-11 DIAGNOSIS — Z510 Encounter for antineoplastic radiation therapy: Secondary | ICD-10-CM | POA: Diagnosis not present

## 2019-04-11 DIAGNOSIS — R300 Dysuria: Secondary | ICD-10-CM

## 2019-04-11 LAB — URINALYSIS, COMPLETE (UACMP) WITH MICROSCOPIC
Bilirubin Urine: NEGATIVE
Glucose, UA: NEGATIVE mg/dL
Hgb urine dipstick: NEGATIVE
Ketones, ur: NEGATIVE mg/dL
Nitrite: NEGATIVE
Protein, ur: NEGATIVE mg/dL
Specific Gravity, Urine: 1.013 (ref 1.005–1.030)
pH: 6 (ref 5.0–8.0)

## 2019-04-12 ENCOUNTER — Other Ambulatory Visit: Payer: Self-pay

## 2019-04-12 ENCOUNTER — Ambulatory Visit
Admission: RE | Admit: 2019-04-12 | Discharge: 2019-04-12 | Disposition: A | Discharge status: Home / Self Care | Payer: Medicare Other | Source: Ambulatory Visit | Attending: Radiation Oncology | Admitting: Radiation Oncology

## 2019-04-12 DIAGNOSIS — C519 Malignant neoplasm of vulva, unspecified: Secondary | ICD-10-CM | POA: Diagnosis not present

## 2019-04-12 DIAGNOSIS — Z510 Encounter for antineoplastic radiation therapy: Secondary | ICD-10-CM | POA: Diagnosis not present

## 2019-04-12 LAB — URINE CULTURE

## 2019-04-13 ENCOUNTER — Ambulatory Visit
Admission: RE | Admit: 2019-04-13 | Discharge: 2019-04-13 | Disposition: A | Discharge status: Home / Self Care | Payer: Medicare Other | Source: Ambulatory Visit | Attending: Radiation Oncology | Admitting: Radiation Oncology

## 2019-04-13 ENCOUNTER — Other Ambulatory Visit: Payer: Self-pay

## 2019-04-13 DIAGNOSIS — Z510 Encounter for antineoplastic radiation therapy: Secondary | ICD-10-CM | POA: Diagnosis not present

## 2019-04-13 DIAGNOSIS — C519 Malignant neoplasm of vulva, unspecified: Secondary | ICD-10-CM | POA: Diagnosis not present
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
## 2019-04-14 ENCOUNTER — Ambulatory Visit
Admission: RE | Admit: 2019-04-14 | Discharge: 2019-04-14 | Disposition: A | Discharge status: Home / Self Care | Payer: Medicare Other | Source: Ambulatory Visit | Attending: Radiation Oncology | Admitting: Radiation Oncology

## 2019-04-14 ENCOUNTER — Ambulatory Visit (INDEPENDENT_AMBULATORY_CARE_PROVIDER_SITE_OTHER): Payer: Medicare Other | Admitting: Cardiovascular Disease

## 2019-04-14 ENCOUNTER — Other Ambulatory Visit: Payer: Self-pay

## 2019-04-14 ENCOUNTER — Encounter: Payer: Self-pay | Admitting: Cardiovascular Disease

## 2019-04-14 VITALS — BP 124/58 | HR 62 | Ht >= 80 in | Wt 188.0 lb

## 2019-04-14 DIAGNOSIS — I6320 Cerebral infarction due to unspecified occlusion or stenosis of unspecified precerebral arteries: Secondary | ICD-10-CM

## 2019-04-14 DIAGNOSIS — R300 Dysuria: Secondary | ICD-10-CM

## 2019-04-14 DIAGNOSIS — I350 Nonrheumatic aortic (valve) stenosis: Secondary | ICD-10-CM | POA: Diagnosis not present

## 2019-04-14 DIAGNOSIS — C519 Malignant neoplasm of vulva, unspecified: Secondary | ICD-10-CM | POA: Diagnosis not present

## 2019-04-14 DIAGNOSIS — Z510 Encounter for antineoplastic radiation therapy: Secondary | ICD-10-CM | POA: Diagnosis not present

## 2019-04-14 LAB — URINALYSIS, COMPLETE (UACMP) WITH MICROSCOPIC
Bilirubin Urine: NEGATIVE
Glucose, UA: NEGATIVE mg/dL
Hgb urine dipstick: NEGATIVE
Ketones, ur: NEGATIVE mg/dL
Nitrite: NEGATIVE
Protein, ur: NEGATIVE mg/dL
Specific Gravity, Urine: 1.011 (ref 1.005–1.030)
pH: 6 (ref 5.0–8.0)

## 2019-04-14 NOTE — Patient Instructions (Signed)
Medication Instructions:  No changes If you need a refill on your cardiac medications before your next appointment, please call your pharmacy.   Lab work:  If you have labs (blood work) drawn today and your tests are completely normal, you will receive your results only by: Marland Kitchen MyChart Message (if you have MyChart) OR . A paper copy in the mail If you have any lab test that is abnormal or we need to change your treatment, we will call you to review the results.  Testing/Procedures: Theodosia Quay, RN will contact you regarding scheduling ct scans   Any Other Special Instructions Will Be Listed Below (If Applicable).

## 2019-04-15 LAB — URINE CULTURE

## 2019-04-17 ENCOUNTER — Other Ambulatory Visit: Payer: Self-pay

## 2019-04-17 ENCOUNTER — Ambulatory Visit
Admission: RE | Admit: 2019-04-17 | Discharge: 2019-04-17 | Disposition: A | Discharge status: Home / Self Care | Payer: Medicare Other | Source: Ambulatory Visit | Attending: Radiation Oncology | Admitting: Radiation Oncology

## 2019-04-17 ENCOUNTER — Other Ambulatory Visit: Payer: Self-pay | Admitting: Radiation Oncology

## 2019-04-17 DIAGNOSIS — C519 Malignant neoplasm of vulva, unspecified: Secondary | ICD-10-CM

## 2019-04-17 DIAGNOSIS — Z510 Encounter for antineoplastic radiation therapy: Secondary | ICD-10-CM | POA: Diagnosis not present

## 2019-04-17 LAB — CMP (CANCER CENTER ONLY)
ALT: 14 U/L (ref 0–44)
AST: 18 U/L (ref 15–41)
Albumin: 3.7 g/dL (ref 3.5–5.0)
Alkaline Phosphatase: 87 U/L (ref 38–126)
Anion gap: 9 (ref 5–15)
BUN: 14 mg/dL (ref 8–23)
CO2: 26 mmol/L (ref 22–32)
Calcium: 10 mg/dL (ref 8.9–10.3)
Chloride: 97 mmol/L — ABNORMAL LOW (ref 98–111)
Creatinine: 0.74 mg/dL (ref 0.44–1.00)
GFR, Est AFR Am: >60 mL/min (ref >60)
GFR, Estimated: >60 mL/min (ref >60)
Glucose, Bld: 109 mg/dL — ABNORMAL HIGH (ref 70–99)
Potassium: 4.3 mmol/L (ref 3.5–5.1)
Sodium: 132 mmol/L — ABNORMAL LOW (ref 135–145)
Total Bilirubin: 0.4 mg/dL (ref 0.3–1.2)
Total Protein: 7.1 g/dL (ref 6.5–8.1)

## 2019-04-17 LAB — CBC WITH DIFFERENTIAL (CANCER CENTER ONLY)
Abs Immature Granulocytes: 0.02 10*3/uL (ref 0.00–0.07)
Basophils Absolute: 0 10*3/uL (ref 0.0–0.1)
Basophils Relative: 1 %
Eosinophils Absolute: 0.1 10*3/uL (ref 0.0–0.5)
Eosinophils Relative: 2 %
HCT: 33.4 % — ABNORMAL LOW (ref 36.0–46.0)
Hemoglobin: 10.9 g/dL — ABNORMAL LOW (ref 12.0–15.0)
Immature Granulocytes: 1 %
Lymphocytes Relative: 6 %
Lymphs Abs: 0.2 10*3/uL — ABNORMAL LOW (ref 0.7–4.0)
MCH: 29 pg (ref 26.0–34.0)
MCHC: 32.6 g/dL (ref 30.0–36.0)
MCV: 88.8 fL (ref 80.0–100.0)
Monocytes Absolute: 0.4 10*3/uL (ref 0.1–1.0)
Monocytes Relative: 11 %
Neutro Abs: 2.7 10*3/uL (ref 1.7–7.7)
Neutrophils Relative %: 79 %
Platelet Count: 104 10*3/uL — ABNORMAL LOW (ref 150–400)
RBC: 3.76 MIL/uL — ABNORMAL LOW (ref 3.87–5.11)
RDW: 14.6 % (ref 11.5–15.5)
WBC Count: 3.3 10*3/uL — ABNORMAL LOW (ref 4.0–10.5)
nRBC: 0 % (ref 0.0–0.2)

## 2019-04-17 MED ORDER — FLUCONAZOLE 100 MG PO TABS: 100.0000 mg | ORAL_TABLET | Freq: Every day | ORAL | 0 refills | Status: DC

## 2019-04-17 MED ORDER — PHENAZOPYRIDINE HCL 200 MG PO TABS: 200.0000 mg | ORAL_TABLET | Freq: Three times a day (TID) | ORAL | 0 refills | Status: DC | PRN

## 2019-04-17 NOTE — Progress Notes (Signed)
Pt to proceed to [facility] lab.

## 2019-04-18 ENCOUNTER — Ambulatory Visit
Admission: RE | Admit: 2019-04-18 | Discharge: 2019-04-18 | Disposition: A | Discharge status: Home / Self Care | Payer: Medicare Other | Source: Ambulatory Visit | Attending: Radiation Oncology | Admitting: Radiation Oncology

## 2019-04-18 ENCOUNTER — Other Ambulatory Visit: Payer: Self-pay

## 2019-04-18 ENCOUNTER — Other Ambulatory Visit: Payer: Self-pay | Admitting: Physician Assistant

## 2019-04-18 DIAGNOSIS — C519 Malignant neoplasm of vulva, unspecified: Secondary | ICD-10-CM | POA: Insufficient documentation

## 2019-04-18 DIAGNOSIS — Z510 Encounter for antineoplastic radiation therapy: Secondary | ICD-10-CM | POA: Diagnosis not present

## 2019-04-18 DIAGNOSIS — I350 Nonrheumatic aortic (valve) stenosis: Secondary | ICD-10-CM

## 2019-04-18 DIAGNOSIS — Z952 Presence of prosthetic heart valve: Secondary | ICD-10-CM

## 2019-04-18 DIAGNOSIS — R0609 Other forms of dyspnea: Secondary | ICD-10-CM

## 2019-04-18 DIAGNOSIS — R0600 Dyspnea, unspecified: Secondary | ICD-10-CM

## 2019-04-19 ENCOUNTER — Ambulatory Visit
Admission: RE | Admit: 2019-04-19 | Discharge: 2019-04-19 | Disposition: A | Discharge status: Home / Self Care | Payer: Medicare Other | Source: Ambulatory Visit | Attending: Radiation Oncology | Admitting: Radiation Oncology

## 2019-04-19 ENCOUNTER — Ambulatory Visit: Payer: Medicare Other | Admitting: Interventional Cardiology

## 2019-04-19 ENCOUNTER — Other Ambulatory Visit: Payer: Self-pay

## 2019-04-19 DIAGNOSIS — C519 Malignant neoplasm of vulva, unspecified: Secondary | ICD-10-CM | POA: Diagnosis not present

## 2019-04-19 DIAGNOSIS — Z510 Encounter for antineoplastic radiation therapy: Secondary | ICD-10-CM | POA: Diagnosis not present

## 2019-04-20 ENCOUNTER — Other Ambulatory Visit: Payer: Self-pay

## 2019-04-20 ENCOUNTER — Ambulatory Visit
Admission: RE | Admit: 2019-04-20 | Discharge: 2019-04-20 | Disposition: A | Discharge status: Home / Self Care | Payer: Medicare Other | Source: Ambulatory Visit | Attending: Radiation Oncology | Admitting: Radiation Oncology

## 2019-04-20 DIAGNOSIS — Z510 Encounter for antineoplastic radiation therapy: Secondary | ICD-10-CM | POA: Diagnosis not present

## 2019-04-20 DIAGNOSIS — C519 Malignant neoplasm of vulva, unspecified: Secondary | ICD-10-CM | POA: Diagnosis not present

## 2019-04-21 ENCOUNTER — Other Ambulatory Visit: Payer: Self-pay | Admitting: Gynecologic Oncology

## 2019-04-21 ENCOUNTER — Telehealth: Payer: Self-pay

## 2019-04-21 ENCOUNTER — Ambulatory Visit
Admission: RE | Admit: 2019-04-21 | Discharge: 2019-04-21 | Disposition: A | Discharge status: Home / Self Care | Payer: Medicare Other | Source: Ambulatory Visit | Attending: Radiation Oncology | Admitting: Radiation Oncology

## 2019-04-21 ENCOUNTER — Telehealth: Payer: Self-pay | Admitting: *Deleted

## 2019-04-21 ENCOUNTER — Encounter: Payer: Self-pay | Admitting: Physician Assistant

## 2019-04-21 ENCOUNTER — Other Ambulatory Visit: Payer: Self-pay

## 2019-04-21 DIAGNOSIS — G893 Neoplasm related pain (acute) (chronic): Secondary | ICD-10-CM

## 2019-04-21 DIAGNOSIS — C519 Malignant neoplasm of vulva, unspecified: Secondary | ICD-10-CM | POA: Diagnosis not present

## 2019-04-21 DIAGNOSIS — Z510 Encounter for antineoplastic radiation therapy: Secondary | ICD-10-CM | POA: Diagnosis not present

## 2019-04-21 DIAGNOSIS — R197 Diarrhea, unspecified: Secondary | ICD-10-CM

## 2019-04-21 MED ORDER — DIPHENOXYLATE-ATROPINE 2.5-0.025 MG PO TABS: 1.0000 | ORAL_TABLET | Freq: Four times a day (QID) | ORAL | 0 refills | Status: DC | PRN

## 2019-04-21 MED ORDER — HYDROCODONE-ACETAMINOPHEN 5-325 MG PO TABS: 1.0000 | ORAL_TABLET | ORAL | 0 refills | Status: AC | PRN

## 2019-04-21 NOTE — Telephone Encounter (Signed)
Daughter calling, very upset that pt was crying out in pain last night and stated "I don't think I can go on". Conveyed to daughter that Dr. Sondra Come was out of the office today but had examined pt twice this week after treatment. Daughter upset and requested "pain medicine that would work on that pain". Attempted to diffuse daughter and reminded daughter that pt had a lot of stressors currently. Conveyed to daughter that M. Cross, NP would evaluate pt once pt arrives for radiation treatment.  Pt was examined by M. Cross, NP and will send new prescriptions in for pain medication and anti-diarrhea. This RN provided pt with lidocaine gel 2% with instructions for use.  Contacted daughter to convey plan per M. Elinor Parkinson, NP. Daughter verbalized understanding and appreciation. Daughter asking if "there's anyone she can talk to about her stress". Conveyed to daughter that this RN would have SW visit with pt one day after treatment next week. Daughter pleased with that plan. Loma Sousa, RN BSN

## 2019-04-21 NOTE — Progress Notes (Signed)
Was called to radiation by RN to discuss patient's uncontrolled pain.  The patient states she takes 0.5 to 1 tablet of hydrocodone at bedtime.  She reports her pain is moderate to severe throughout the day.  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  She does not take anything throughout the day.  She states she becomes "loopy" when taking oxycodone.  Advised her that she can increase the frequency of taking the hydrocodone during the day as long as she is not driving.  Recommended taking one tablet at breakfast, lunch, dinner, and at bedtime and see how her pain responds.  Advised her that if she is in severe pain she could take two tablets at that time. Verbalizing understanding.  She also reports watery, loose stools since the beginning of the week. She states "it is not all the time".  Advised her that lomotil would be sent in for her to take. She has not been on recent antibiotics since June so suspicion for c diff is low and most likely caused from radiation. Advised patient to notify the office or Dr. Sondra Come if her pain has not improved.  RN to contact the patient's daughter with the above changes.

## 2019-04-21 NOTE — Telephone Encounter (Signed)
Patient's daughter called and left a number of [phone]. Daughter stated "she is in a lot of pain. Can you give her something or do anything." Melissa APP given message. Patient sees Dr. Sondra Come

## 2019-04-25 ENCOUNTER — Ambulatory Visit
Admission: RE | Admit: 2019-04-25 | Discharge: 2019-04-25 | Disposition: A | Discharge status: Home / Self Care | Payer: Medicare Other | Source: Ambulatory Visit | Attending: Radiation Oncology | Admitting: Radiation Oncology

## 2019-04-25 ENCOUNTER — Other Ambulatory Visit: Payer: Self-pay

## 2019-04-25 DIAGNOSIS — C519 Malignant neoplasm of vulva, unspecified: Secondary | ICD-10-CM | POA: Diagnosis not present

## 2019-04-25 DIAGNOSIS — Z510 Encounter for antineoplastic radiation therapy: Secondary | ICD-10-CM | POA: Diagnosis not present

## 2019-04-26 ENCOUNTER — Ambulatory Visit
Admission: RE | Admit: 2019-04-26 | Discharge: 2019-04-26 | Disposition: A | Discharge status: Home / Self Care | Payer: Medicare Other | Source: Ambulatory Visit | Attending: Radiation Oncology | Admitting: Radiation Oncology

## 2019-04-26 ENCOUNTER — Other Ambulatory Visit: Payer: Self-pay

## 2019-04-26 DIAGNOSIS — Z510 Encounter for antineoplastic radiation therapy: Secondary | ICD-10-CM | POA: Diagnosis not present

## 2019-04-26 DIAGNOSIS — C519 Malignant neoplasm of vulva, unspecified: Secondary | ICD-10-CM | POA: Diagnosis not present

## 2019-04-27 ENCOUNTER — Other Ambulatory Visit: Payer: Self-pay

## 2019-04-27 ENCOUNTER — Ambulatory Visit (HOSPITAL_BASED_OUTPATIENT_CLINIC_OR_DEPARTMENT_OTHER)
Admission: RE | Admit: 2019-04-27 | Discharge: 2019-04-27 | Disposition: A | Discharge status: Home / Self Care | Payer: Medicare Other | Source: Ambulatory Visit | Attending: Physician Assistant | Admitting: Physician Assistant

## 2019-04-27 ENCOUNTER — Ambulatory Visit (HOSPITAL_COMMUNITY)
Admission: RE | Admit: 2019-04-27 | Discharge: 2019-04-27 | Disposition: A | Discharge status: Home / Self Care | Payer: Medicare Other | Source: Ambulatory Visit | Attending: Physician Assistant | Admitting: Physician Assistant

## 2019-04-27 ENCOUNTER — Ambulatory Visit
Admission: RE | Admit: 2019-04-27 | Discharge: 2019-04-27 | Disposition: A | Discharge status: Home / Self Care | Payer: Medicare Other | Source: Ambulatory Visit | Attending: Radiation Oncology | Admitting: Radiation Oncology

## 2019-04-27 DIAGNOSIS — C519 Malignant neoplasm of vulva, unspecified: Secondary | ICD-10-CM | POA: Diagnosis not present

## 2019-04-27 DIAGNOSIS — R0609 Other forms of dyspnea: Secondary | ICD-10-CM | POA: Diagnosis not present

## 2019-04-27 DIAGNOSIS — Z952 Presence of prosthetic heart valve: Secondary | ICD-10-CM

## 2019-04-27 DIAGNOSIS — I350 Nonrheumatic aortic (valve) stenosis: Secondary | ICD-10-CM | POA: Diagnosis not present

## 2019-04-27 DIAGNOSIS — R0600 Dyspnea, unspecified: Secondary | ICD-10-CM

## 2019-04-27 DIAGNOSIS — Z01818 Encounter for other preprocedural examination: Secondary | ICD-10-CM | POA: Diagnosis not present

## 2019-04-27 MED ORDER — IOHEXOL 350 MG/ML SOLN
100.0000 mL | Freq: Once | INTRAVENOUS | Status: AC | PRN
  Administered 2019-04-27: 14:00:00 100 mL via INTRAVENOUS

## 2019-04-27 NOTE — Progress Notes (Signed)
VASCULAR LAB PRELIMINARY  PRELIMINARY  PRELIMINARY  PRELIMINARY  Carotid duplex completed.    Preliminary report:  See CV proc for preliminary results.   Aldea Avis, RVT 04/27/2019, 4:51 PM

## 2019-04-28 ENCOUNTER — Other Ambulatory Visit: Payer: Self-pay

## 2019-04-28 ENCOUNTER — Ambulatory Visit
Admission: RE | Admit: 2019-04-28 | Discharge: 2019-04-28 | Disposition: A | Discharge status: Home / Self Care | Payer: Medicare Other | Source: Ambulatory Visit | Attending: Radiation Oncology | Admitting: Radiation Oncology

## 2019-04-28 DIAGNOSIS — Z510 Encounter for antineoplastic radiation therapy: Secondary | ICD-10-CM | POA: Diagnosis not present

## 2019-04-28 DIAGNOSIS — C519 Malignant neoplasm of vulva, unspecified: Secondary | ICD-10-CM | POA: Diagnosis not present

## 2019-05-01 ENCOUNTER — Ambulatory Visit
Admission: RE | Admit: 2019-05-01 | Discharge: 2019-05-01 | Disposition: A | Discharge status: Home / Self Care | Payer: Medicare Other | Source: Ambulatory Visit | Attending: Radiation Oncology | Admitting: Radiation Oncology

## 2019-05-01 ENCOUNTER — Other Ambulatory Visit: Payer: Self-pay

## 2019-05-01 DIAGNOSIS — C519 Malignant neoplasm of vulva, unspecified: Secondary | ICD-10-CM | POA: Diagnosis not present

## 2019-05-02 ENCOUNTER — Inpatient Hospital Stay (HOSPITAL_COMMUNITY)
Admission: EM | Admit: 2019-05-02 | Discharge: 2019-05-08 | DRG: 378 | Disposition: A | Discharge status: Home Health | Payer: Medicare Other | Attending: Internal Medicine | Admitting: Internal Medicine

## 2019-05-02 ENCOUNTER — Telehealth: Payer: Self-pay

## 2019-05-02 ENCOUNTER — Other Ambulatory Visit: Payer: Self-pay

## 2019-05-02 ENCOUNTER — Ambulatory Visit
Admission: RE | Admit: 2019-05-02 | Discharge: 2019-05-02 | Disposition: A | Discharge status: Home / Self Care | Payer: Medicare Other | Source: Ambulatory Visit | Attending: Radiation Oncology | Admitting: Radiation Oncology

## 2019-05-02 ENCOUNTER — Emergency Department (HOSPITAL_COMMUNITY): Payer: Medicare Other

## 2019-05-02 DIAGNOSIS — R011 Cardiac murmur, unspecified: Secondary | ICD-10-CM | POA: Diagnosis present

## 2019-05-02 DIAGNOSIS — K921 Melena: Secondary | ICD-10-CM | POA: Diagnosis not present

## 2019-05-02 DIAGNOSIS — K754 Autoimmune hepatitis: Secondary | ICD-10-CM | POA: Diagnosis present

## 2019-05-02 DIAGNOSIS — D6959 Other secondary thrombocytopenia: Secondary | ICD-10-CM | POA: Diagnosis present

## 2019-05-02 DIAGNOSIS — I5032 Chronic diastolic (congestive) heart failure: Secondary | ICD-10-CM

## 2019-05-02 DIAGNOSIS — Z7982 Long term (current) use of aspirin: Secondary | ICD-10-CM

## 2019-05-02 DIAGNOSIS — R0602 Shortness of breath: Secondary | ICD-10-CM | POA: Diagnosis not present

## 2019-05-02 DIAGNOSIS — R5381 Other malaise: Secondary | ICD-10-CM | POA: Diagnosis present

## 2019-05-02 DIAGNOSIS — D61818 Other pancytopenia: Secondary | ICD-10-CM | POA: Diagnosis present

## 2019-05-02 DIAGNOSIS — I495 Sick sinus syndrome: Secondary | ICD-10-CM | POA: Diagnosis present

## 2019-05-02 DIAGNOSIS — E119 Type 2 diabetes mellitus without complications: Secondary | ICD-10-CM

## 2019-05-02 DIAGNOSIS — Z96612 Presence of left artificial shoulder joint: Secondary | ICD-10-CM | POA: Diagnosis present

## 2019-05-02 DIAGNOSIS — I350 Nonrheumatic aortic (valve) stenosis: Secondary | ICD-10-CM | POA: Diagnosis present

## 2019-05-02 DIAGNOSIS — Z96611 Presence of right artificial shoulder joint: Secondary | ICD-10-CM | POA: Diagnosis present

## 2019-05-02 DIAGNOSIS — E1169 Type 2 diabetes mellitus with other specified complication: Secondary | ICD-10-CM

## 2019-05-02 DIAGNOSIS — D62 Acute posthemorrhagic anemia: Secondary | ICD-10-CM | POA: Diagnosis not present

## 2019-05-02 DIAGNOSIS — I10 Essential (primary) hypertension: Secondary | ICD-10-CM

## 2019-05-02 DIAGNOSIS — Z79899 Other long term (current) drug therapy: Secondary | ICD-10-CM

## 2019-05-02 DIAGNOSIS — D649 Anemia, unspecified: Secondary | ICD-10-CM

## 2019-05-02 DIAGNOSIS — Z8679 Personal history of other diseases of the circulatory system: Secondary | ICD-10-CM

## 2019-05-02 DIAGNOSIS — K219 Gastro-esophageal reflux disease without esophagitis: Secondary | ICD-10-CM | POA: Diagnosis present

## 2019-05-02 DIAGNOSIS — F419 Anxiety disorder, unspecified: Secondary | ICD-10-CM | POA: Diagnosis present

## 2019-05-02 DIAGNOSIS — Z20828 Contact with and (suspected) exposure to other viral communicable diseases: Secondary | ICD-10-CM | POA: Diagnosis present

## 2019-05-02 DIAGNOSIS — Z7984 Long term (current) use of oral hypoglycemic drugs: Secondary | ICD-10-CM

## 2019-05-02 DIAGNOSIS — Z90710 Acquired absence of both cervix and uterus: Secondary | ICD-10-CM

## 2019-05-02 DIAGNOSIS — Z961 Presence of intraocular lens: Secondary | ICD-10-CM | POA: Diagnosis present

## 2019-05-02 DIAGNOSIS — K589 Irritable bowel syndrome without diarrhea: Secondary | ICD-10-CM | POA: Diagnosis present

## 2019-05-02 DIAGNOSIS — E785 Hyperlipidemia, unspecified: Secondary | ICD-10-CM | POA: Diagnosis present

## 2019-05-02 DIAGNOSIS — Z9841 Cataract extraction status, right eye: Secondary | ICD-10-CM

## 2019-05-02 DIAGNOSIS — C519 Malignant neoplasm of vulva, unspecified: Secondary | ICD-10-CM | POA: Diagnosis present

## 2019-05-02 DIAGNOSIS — Z888 Allergy status to other drugs, medicaments and biological substances status: Secondary | ICD-10-CM

## 2019-05-02 DIAGNOSIS — E1159 Type 2 diabetes mellitus with other circulatory complications: Secondary | ICD-10-CM | POA: Diagnosis not present

## 2019-05-02 DIAGNOSIS — I4510 Unspecified right bundle-branch block: Secondary | ICD-10-CM | POA: Diagnosis present

## 2019-05-02 DIAGNOSIS — Z79890 Hormone replacement therapy (postmenopausal): Secondary | ICD-10-CM

## 2019-05-02 DIAGNOSIS — K6289 Other specified diseases of anus and rectum: Secondary | ICD-10-CM | POA: Diagnosis not present

## 2019-05-02 DIAGNOSIS — Z9842 Cataract extraction status, left eye: Secondary | ICD-10-CM

## 2019-05-02 DIAGNOSIS — D638 Anemia in other chronic diseases classified elsewhere: Secondary | ICD-10-CM | POA: Diagnosis present

## 2019-05-02 DIAGNOSIS — E039 Hypothyroidism, unspecified: Secondary | ICD-10-CM | POA: Diagnosis not present

## 2019-05-02 DIAGNOSIS — Z794 Long term (current) use of insulin: Secondary | ICD-10-CM | POA: Diagnosis not present

## 2019-05-02 DIAGNOSIS — E890 Postprocedural hypothyroidism: Secondary | ICD-10-CM | POA: Diagnosis present

## 2019-05-02 DIAGNOSIS — K922 Gastrointestinal hemorrhage, unspecified: Secondary | ICD-10-CM | POA: Diagnosis not present

## 2019-05-02 DIAGNOSIS — I110 Hypertensive heart disease with heart failure: Secondary | ICD-10-CM | POA: Diagnosis present

## 2019-05-02 DIAGNOSIS — G40909 Epilepsy, unspecified, not intractable, without status epilepticus: Secondary | ICD-10-CM | POA: Diagnosis present

## 2019-05-02 DIAGNOSIS — D696 Thrombocytopenia, unspecified: Secondary | ICD-10-CM

## 2019-05-02 DIAGNOSIS — R531 Weakness: Secondary | ICD-10-CM | POA: Diagnosis not present

## 2019-05-02 DIAGNOSIS — I5033 Acute on chronic diastolic (congestive) heart failure: Secondary | ICD-10-CM | POA: Diagnosis present

## 2019-05-02 LAB — CBC WITH DIFFERENTIAL/PLATELET
Abs Immature Granulocytes: 0.04 10*3/uL (ref 0.00–0.07)
Basophils Absolute: 0 10*3/uL (ref 0.0–0.1)
Basophils Relative: 1 %
Eosinophils Absolute: 0.1 10*3/uL (ref 0.0–0.5)
Eosinophils Relative: 2 %
HCT: 26 % — ABNORMAL LOW (ref 36.0–46.0)
Hemoglobin: 8.5 g/dL — ABNORMAL LOW (ref 12.0–15.0)
Immature Granulocytes: 1 %
Lymphocytes Relative: 3 %
Lymphs Abs: 0.1 10*3/uL — ABNORMAL LOW (ref 0.7–4.0)
MCH: 29.3 pg (ref 26.0–34.0)
MCHC: 32.7 g/dL (ref 30.0–36.0)
MCV: 89.7 fL (ref 80.0–100.0)
Monocytes Absolute: 0.4 10*3/uL (ref 0.1–1.0)
Monocytes Relative: 11 %
Neutro Abs: 2.7 10*3/uL (ref 1.7–7.7)
Neutrophils Relative %: 82 %
Platelets: 134 10*3/uL — ABNORMAL LOW (ref 150–400)
RBC: 2.9 MIL/uL — ABNORMAL LOW (ref 3.87–5.11)
RDW: 15.7 % — ABNORMAL HIGH (ref 11.5–15.5)
WBC: 3.2 10*3/uL — ABNORMAL LOW (ref 4.0–10.5)
nRBC: 0 % (ref 0.0–0.2)

## 2019-05-02 LAB — CMP (CANCER CENTER ONLY)
ALT: 13 U/L (ref 0–44)
AST: 19 U/L (ref 15–41)
Albumin: 3.3 g/dL — ABNORMAL LOW (ref 3.5–5.0)
Alkaline Phosphatase: 77 U/L (ref 38–126)
Anion gap: 5 (ref 5–15)
BUN: 14 mg/dL (ref 8–23)
CO2: 27 mmol/L (ref 22–32)
Calcium: 9.4 mg/dL (ref 8.9–10.3)
Chloride: 95 mmol/L — ABNORMAL LOW (ref 98–111)
Creatinine: 0.77 mg/dL (ref 0.44–1.00)
GFR, Est AFR Am: >60 mL/min (ref >60)
GFR, Estimated: >60 mL/min (ref >60)
Glucose, Bld: 173 mg/dL — ABNORMAL HIGH (ref 70–99)
Potassium: 4.7 mmol/L (ref 3.5–5.1)
Sodium: 127 mmol/L — ABNORMAL LOW (ref 135–145)
Total Bilirubin: 0.3 mg/dL (ref 0.3–1.2)
Total Protein: 6.5 g/dL (ref 6.5–8.1)

## 2019-05-02 LAB — CBC WITH DIFFERENTIAL (CANCER CENTER ONLY)
Abs Immature Granulocytes: 0.03 10*3/uL (ref 0.00–0.07)
Basophils Absolute: 0 10*3/uL (ref 0.0–0.1)
Basophils Relative: 0 %
Eosinophils Absolute: 0.1 10*3/uL (ref 0.0–0.5)
Eosinophils Relative: 2 %
HCT: 25.4 % — ABNORMAL LOW (ref 36.0–46.0)
Hemoglobin: 8.2 g/dL — ABNORMAL LOW (ref 12.0–15.0)
Immature Granulocytes: 1 %
Lymphocytes Relative: 3 %
Lymphs Abs: 0.1 10*3/uL — ABNORMAL LOW (ref 0.7–4.0)
MCH: 28.8 pg (ref 26.0–34.0)
MCHC: 32.3 g/dL (ref 30.0–36.0)
MCV: 89.1 fL (ref 80.0–100.0)
Monocytes Absolute: 0.3 10*3/uL (ref 0.1–1.0)
Monocytes Relative: 10 %
Neutro Abs: 2.7 10*3/uL (ref 1.7–7.7)
Neutrophils Relative %: 84 %
Platelet Count: 126 10*3/uL — ABNORMAL LOW (ref 150–400)
RBC: 2.85 MIL/uL — ABNORMAL LOW (ref 3.87–5.11)
RDW: 15.5 % (ref 11.5–15.5)
WBC Count: 3.3 10*3/uL — ABNORMAL LOW (ref 4.0–10.5)
nRBC: 0 % (ref 0.0–0.2)

## 2019-05-02 LAB — COMPREHENSIVE METABOLIC PANEL
ALT: 18 U/L (ref 0–44)
AST: 21 U/L (ref 15–41)
Albumin: 3.5 g/dL (ref 3.5–5.0)
Alkaline Phosphatase: 72 U/L (ref 38–126)
Anion gap: 7 (ref 5–15)
BUN: 15 mg/dL (ref 8–23)
CO2: 28 mmol/L (ref 22–32)
Calcium: 9.4 mg/dL (ref 8.9–10.3)
Chloride: 96 mmol/L — ABNORMAL LOW (ref 98–111)
Creatinine, Ser: 0.68 mg/dL (ref 0.44–1.00)
GFR calc Af Amer: >60 mL/min (ref >60)
GFR calc non Af Amer: >60 mL/min (ref >60)
Glucose, Bld: 115 mg/dL — ABNORMAL HIGH (ref 70–99)
Potassium: 4.2 mmol/L (ref 3.5–5.1)
Sodium: 131 mmol/L — ABNORMAL LOW (ref 135–145)
Total Bilirubin: 0.5 mg/dL (ref 0.3–1.2)
Total Protein: 6.7 g/dL (ref 6.5–8.1)

## 2019-05-02 LAB — CBC
HCT: 26.3 % — ABNORMAL LOW (ref 36.0–46.0)
Hemoglobin: 8.6 g/dL — ABNORMAL LOW (ref 12.0–15.0)
MCH: 29.4 pg (ref 26.0–34.0)
MCHC: 32.7 g/dL (ref 30.0–36.0)
MCV: 89.8 fL (ref 80.0–100.0)
Platelets: 127 10*3/uL — ABNORMAL LOW (ref 150–400)
RBC: 2.93 MIL/uL — ABNORMAL LOW (ref 3.87–5.11)
RDW: 15.7 % — ABNORMAL HIGH (ref 11.5–15.5)
WBC: 2.6 10*3/uL — ABNORMAL LOW (ref 4.0–10.5)
nRBC: 0 % (ref 0.0–0.2)

## 2019-05-02 LAB — ABO/RH: ABO/RH(D): A POS

## 2019-05-02 LAB — POC OCCULT BLOOD, ED: Fecal Occult Bld: POSITIVE — AB

## 2019-05-02 LAB — TYPE AND SCREEN
ABO/RH(D): A POS
Antibody Screen: NEGATIVE

## 2019-05-02 LAB — PROTIME-INR
INR: 1 (ref 0.8–1.2)
Prothrombin Time: 13.3 seconds (ref 11.4–15.2)

## 2019-05-02 LAB — GLUCOSE, CAPILLARY: Glucose-Capillary: 112 mg/dL — ABNORMAL HIGH (ref 70–99)

## 2019-05-02 LAB — APTT: aPTT: 34 seconds (ref 24–36)

## 2019-05-02 MED ORDER — IRBESARTAN 150 MG PO TABS
150.0000 mg | ORAL_TABLET | Freq: Two times a day (BID) | ORAL | Status: DC
  Administered 2019-05-02 – 2019-05-08 (×12): 150 mg via ORAL
  Filled 2019-05-02 (×12): qty 1

## 2019-05-02 MED ORDER — AMLODIPINE BESYLATE 10 MG PO TABS
10.0000 mg | ORAL_TABLET | Freq: Every day | ORAL | Status: DC
  Administered 2019-05-03 – 2019-05-08 (×6): 10 mg via ORAL
  Filled 2019-05-02 (×6): qty 1

## 2019-05-02 MED ORDER — SODIUM CHLORIDE 0.9 % IV SOLN
INTRAVENOUS | Status: DC
  Administered 2019-05-02 – 2019-05-04 (×3): via INTRAVENOUS

## 2019-05-02 MED ORDER — ALPRAZOLAM 0.5 MG PO TABS
0.5000 mg | ORAL_TABLET | Freq: Every day | ORAL | Status: DC
  Administered 2019-05-02 – 2019-05-07 (×6): 0.5 mg via ORAL
  Filled 2019-05-02 (×8): qty 1

## 2019-05-02 MED ORDER — DOXAZOSIN MESYLATE 8 MG PO TABS
8.0000 mg | ORAL_TABLET | Freq: Every day | ORAL | Status: DC
  Administered 2019-05-02 – 2019-05-07 (×6): 8 mg via ORAL
  Filled 2019-05-02: qty 4
  Filled 2019-05-02: qty 1
  Filled 2019-05-02: qty 4
  Filled 2019-05-02 (×2): qty 1
  Filled 2019-05-02: qty 4
  Filled 2019-05-02 (×2): qty 1
  Filled 2019-05-02 (×3): qty 4
  Filled 2019-05-02 (×2): qty 1

## 2019-05-02 MED ORDER — POLYVINYL ALCOHOL 1.4 % OP SOLN: 1.0000 [drp] | Freq: Four times a day (QID) | OPHTHALMIC | Status: DC | PRN

## 2019-05-02 MED ORDER — ALPRAZOLAM 0.5 MG PO TABS
0.5000 mg | ORAL_TABLET | Freq: Every day | ORAL | Status: DC | PRN
  Administered 2019-05-03 – 2019-05-06 (×3): 0.5 mg via ORAL
  Filled 2019-05-02 (×2): qty 1

## 2019-05-02 MED ORDER — DOXAZOSIN MESYLATE 2 MG PO TABS
4.0000 mg | ORAL_TABLET | Freq: Every day | ORAL | Status: DC
  Administered 2019-05-03 – 2019-05-08 (×6): 4 mg via ORAL
  Filled 2019-05-02 (×6): qty 2

## 2019-05-02 MED ORDER — FUROSEMIDE 20 MG PO TABS
20.0000 mg | ORAL_TABLET | Freq: Every day | ORAL | Status: DC
  Administered 2019-05-03 – 2019-05-08 (×6): 20 mg via ORAL
  Filled 2019-05-02 (×6): qty 1

## 2019-05-02 MED ORDER — FLUCONAZOLE 100 MG PO TABS
100.0000 mg | ORAL_TABLET | Freq: Every day | ORAL | Status: DC
  Administered 2019-05-03 – 2019-05-08 (×6): 100 mg via ORAL
  Filled 2019-05-02 (×6): qty 1

## 2019-05-02 MED ORDER — HYDROCODONE-ACETAMINOPHEN 5-325 MG PO TABS
1.0000 | ORAL_TABLET | ORAL | Status: DC | PRN
  Administered 2019-05-02 – 2019-05-05 (×7): 1 via ORAL
  Administered 2019-05-05 – 2019-05-06 (×2): 2 via ORAL
  Administered 2019-05-06 – 2019-05-08 (×5): 1 via ORAL
  Filled 2019-05-02 (×4): qty 1
  Filled 2019-05-02: qty 2
  Filled 2019-05-02: qty 1
  Filled 2019-05-02: qty 2
  Filled 2019-05-02 (×8): qty 1

## 2019-05-02 MED ORDER — LEVETIRACETAM 500 MG PO TABS
500.0000 mg | ORAL_TABLET | Freq: Every day | ORAL | Status: DC
  Administered 2019-05-03 – 2019-05-08 (×6): 500 mg via ORAL
  Filled 2019-05-02 (×6): qty 1

## 2019-05-02 MED ORDER — PANTOPRAZOLE SODIUM 40 MG IV SOLR
40.0000 mg | Freq: Every day | INTRAVENOUS | Status: DC
  Administered 2019-05-02 – 2019-05-04 (×3): 40 mg via INTRAVENOUS
  Filled 2019-05-02 (×3): qty 40

## 2019-05-02 MED ORDER — ROSUVASTATIN CALCIUM 20 MG PO TABS
20.0000 mg | ORAL_TABLET | ORAL | Status: DC
  Administered 2019-05-03 – 2019-05-05 (×2): 20 mg via ORAL
  Filled 2019-05-02 (×2): qty 1

## 2019-05-02 MED ORDER — LEVOTHYROXINE SODIUM 25 MCG PO TABS
125.0000 ug | ORAL_TABLET | Freq: Every day | ORAL | Status: DC
  Administered 2019-05-03 – 2019-05-07 (×5): 125 ug via ORAL
  Filled 2019-05-02 (×5): qty 1

## 2019-05-02 MED ORDER — INSULIN ASPART 100 UNIT/ML ~~LOC~~ SOLN
0.0000 [IU] | Freq: Three times a day (TID) | SUBCUTANEOUS | Status: DC
  Administered 2019-05-03 – 2019-05-05 (×4): 1 [IU] via SUBCUTANEOUS
  Administered 2019-05-05: 2 [IU] via SUBCUTANEOUS
  Administered 2019-05-06: 1 [IU] via SUBCUTANEOUS
  Administered 2019-05-06: 2 [IU] via SUBCUTANEOUS
  Administered 2019-05-07 (×2): 1 [IU] via SUBCUTANEOUS
  Administered 2019-05-07: 2 [IU] via SUBCUTANEOUS
  Administered 2019-05-08: 1 [IU] via SUBCUTANEOUS
  Administered 2019-05-08: 2 [IU] via SUBCUTANEOUS

## 2019-05-02 NOTE — Telephone Encounter (Signed)
Contacted daughter to convey today's labs were resulted and Dr. Sondra Come wanted pt to present to [facility] ED. Daughter verbalized understanding and agreement. Loma Sousa, RN BSN

## 2019-05-02 NOTE — ED Triage Notes (Addendum)
Pt arrived POV with daughter in wheelchair C/O SOB on exertion, weakness, black stoolsX3, and dizziness. Pt denies falls. A/0X4 VSS Afebrile. Report of abnormal labs from onc appointment this morning. Pt also reports "weight gain of 3 pounds last few days significant to heart problems"
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
## 2019-05-02 NOTE — ED Notes (Signed)
X-ray at bedside
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
## 2019-05-03 ENCOUNTER — Encounter (HOSPITAL_COMMUNITY): Payer: Self-pay

## 2019-05-03 ENCOUNTER — Ambulatory Visit
Admission: RE | Admit: 2019-05-03 | Discharge: 2019-05-03 | Disposition: A | Discharge status: Home / Self Care | Payer: Medicare Other | Source: Ambulatory Visit | Attending: Radiation Oncology | Admitting: Radiation Oncology

## 2019-05-03 DIAGNOSIS — D62 Acute posthemorrhagic anemia: Secondary | ICD-10-CM

## 2019-05-03 DIAGNOSIS — E039 Hypothyroidism, unspecified: Secondary | ICD-10-CM

## 2019-05-03 LAB — CBC
HCT: 25.1 % — ABNORMAL LOW (ref 36.0–46.0)
HCT: 27.1 % — ABNORMAL LOW (ref 36.0–46.0)
Hemoglobin: 8 g/dL — ABNORMAL LOW (ref 12.0–15.0)
Hemoglobin: 8.7 g/dL — ABNORMAL LOW (ref 12.0–15.0)
MCH: 28.9 pg (ref 26.0–34.0)
MCH: 28.9 pg (ref 26.0–34.0)
MCHC: 31.9 g/dL (ref 30.0–36.0)
MCHC: 32.1 g/dL (ref 30.0–36.0)
MCV: 90.6 fL (ref 80.0–100.0)
MCV: 90 fL (ref 80.0–100.0)
Platelets: 127 10*3/uL — ABNORMAL LOW (ref 150–400)
Platelets: 135 10*3/uL — ABNORMAL LOW (ref 150–400)
RBC: 2.77 MIL/uL — ABNORMAL LOW (ref 3.87–5.11)
RBC: 3.01 MIL/uL — ABNORMAL LOW (ref 3.87–5.11)
RDW: 15.6 % — ABNORMAL HIGH (ref 11.5–15.5)
RDW: 16 % — ABNORMAL HIGH (ref 11.5–15.5)
WBC: 2.1 10*3/uL — ABNORMAL LOW (ref 4.0–10.5)
WBC: 2.7 10*3/uL — ABNORMAL LOW (ref 4.0–10.5)
nRBC: 0 % (ref 0.0–0.2)
nRBC: 0 % (ref 0.0–0.2)

## 2019-05-03 LAB — BASIC METABOLIC PANEL
Anion gap: 8 (ref 5–15)
BUN: 11 mg/dL (ref 8–23)
CO2: 26 mmol/L (ref 22–32)
Calcium: 9.1 mg/dL (ref 8.9–10.3)
Chloride: 97 mmol/L — ABNORMAL LOW (ref 98–111)
Creatinine, Ser: 0.55 mg/dL (ref 0.44–1.00)
GFR calc Af Amer: >60 mL/min (ref >60)
GFR calc non Af Amer: >60 mL/min (ref >60)
Glucose, Bld: 104 mg/dL — ABNORMAL HIGH (ref 70–99)
Potassium: 3.7 mmol/L (ref 3.5–5.1)
Sodium: 131 mmol/L — ABNORMAL LOW (ref 135–145)

## 2019-05-03 LAB — GLUCOSE, CAPILLARY
Glucose-Capillary: 123 mg/dL — ABNORMAL HIGH (ref 70–99)
Glucose-Capillary: 114 mg/dL — ABNORMAL HIGH (ref 70–99)
Glucose-Capillary: 109 mg/dL — ABNORMAL HIGH (ref 70–99)
Glucose-Capillary: 147 mg/dL — ABNORMAL HIGH (ref 70–99)

## 2019-05-03 LAB — SARS CORONAVIRUS 2 (TAT 6-24 HRS): SARS Coronavirus 2: NEGATIVE

## 2019-05-03 NOTE — Progress Notes (Signed)
Occupational Therapy Evaluation Patient Details Name: Hailey Cobb MRN: [id number] DOB: 1932/05/20 Today's Date: 05/03/2019    History of Present Illness 85 y.o. female with medical history significant of diastolic congestive heart failure, aortic stenosis, tachybradycardia syndrome, hypertension, subdural hematoma, seizure, hypothyroidism status post thyroidectomy, vulvar cancer on radiation treatment who presented with concerns of melena, increasing weakness and shortness of breath.    Clinical Impression   PTA, pt living at home with her husband, who has dementia. Pt is primary caregiver for her husband, but she has assistance from 10am-1pm while she goes to her radiation treatment. Pt currently drives and daughter assists with groceries,etc. Daughter lives close by, works, but can provide some level of assistance. Pt fatigues easily with ADL and demonstrates increased WOB with short distance mobility of 15 ft, but SpO2 remain above 90 on RA. Feel pt is an excellent candidate for a program like [facility] with [facility] to maximize functional level of independence. Will follow acutely to facilitate safe DC home.     Follow Up Recommendations  Home health OT;Supervision - Intermittent    Equipment Recommendations  3 in 1 bedside commode    Recommendations for Other Services PT consult     Precautions / Restrictions Precautions Precautions: Fall      Mobility Bed Mobility - modified independent with increased time and use of rails                  Transfers Overall transfer level: Needs assistance Equipment used: Rolling walker (2 wheeled) Transfers: Sit to/from [facility] Sit to Stand: Min guard Stand pivot transfers: Min guard            Balance Overall balance assessment: Needs assistance   Sitting balance-Leahy Scale: Good       Standing balance-Leahy Scale: Fair                             ADL either performed or assessed with  clinical judgement   ADL Overall ADL's : Needs assistance/impaired     Grooming: Set up;Sitting   Upper Body Bathing: Set up;Sitting   Lower Body Bathing: Min guard;Sit to/from stand   Upper Body Dressing : Set up;Sitting   Lower Body Dressing: Min guard;Sit to/from stand   Toilet Transfer: Min guard;RW;BSC;Stand-pivot   Toileting- Water quality scientist and Hygiene: Set up;Sit to/from stand       Functional mobility during ADLs: Min guard;Rolling walker;Cueing for safety(VC for hand placement) General ADL Comments: Easily fatigues     Vision         Perception     Praxis      Pertinent Vitals/Pain Pain Assessment: Faces Faces Pain Scale: Hurts little more Pain Location: knee pain with mobility Pain Descriptors / Indicators: Discomfort Pain Intervention(s): Limited activity within patient's tolerance     Hand Dominance Right   Extremity/Trunk Assessment Upper Extremity Assessment Upper Extremity Assessment: Generalized weakness   Lower Extremity Assessment Lower Extremity Assessment: Defer to PT evaluation   Cervical / Trunk Assessment Cervical / Trunk Assessment: Normal   Communication Communication Communication: No difficulties   Cognition Arousal/Alertness: Awake/alert Behavior During Therapy: WFL for tasks assessed/performed Overall Cognitive Status: Within Functional Limits for tasks assessed                                     General Comments  Exercises     Shoulder Instructions      Home Living Family/patient expects to be discharged to:: Private residence Living Arrangements: Spouse/significant other;Children Available Help at Discharge: Available PRN/intermittently Type of Home: House Home Access: Stairs to enter;Ramped entrance(ramped entrance on back) Entrance Stairs-Number of Steps: 2-3 Entrance Stairs-Rails: Can reach both Home Layout: One level     Bathroom Shower/Tub: Mining engineer: Standard Bathroom Accessibility: Yes How Accessible: Accessible via walker Home Equipment: [place] - 2 wheels;Cane - single point;Shower seat          Prior Functioning/Environment Level of Independence: Independent        Comments: Has been using a RW just before admission. Was driving; takes care of her husband who has dementia        OT Problem List: Decreased strength;Decreased activity tolerance;Decreased knowledge of use of DME or AE;Cardiopulmonary status limiting activity;Pain      OT Treatment/Interventions: Self-care/ADL training;Therapeutic exercise;Energy conservation;DME and/or AE instruction;Therapeutic activities;Patient/family education;Balance training    OT Goals(Current goals can be found in the care plan section) Acute Rehab OT Goals Patient Stated Goal: to get stronger adn back to being independentq OT Goal Formulation: With patient Time For Goal Achievement: 05/17/19 Potential to Achieve Goals: Good  OT Frequency: Min 3X/week   Barriers to D/C:            Co-evaluation              AM-PAC OT "6 Clicks" Daily Activity     Outcome Measure Help from another person eating meals?: None Help from another person taking care of personal grooming?: A Little Help from another person toileting, which includes using toliet, bedpan, or urinal?: A Little Help from another person bathing (including washing, rinsing, drying)?: A Little Help from another person to put on and taking off regular upper body clothing?: A Little Help from another person to put on and taking off regular lower body clothing?: A Little 6 Click Score: 19   End of Session Equipment Utilized During Treatment: Rolling walker Nurse Communication: Mobility status;Other (comment)(DC needs)  Activity Tolerance: Patient tolerated treatment well Patient left: in bed;with call bell/phone within reach;with nursing/sitter in room  OT Visit Diagnosis: Unsteadiness on feet  (R26.81);Muscle weakness (generalized) (M62.81);Pain Pain - part of body: Knee                Time: 0910-0940 OT Time Calculation (min): 30 min Charges:  OT General Charges $OT Visit: 1 Visit OT Evaluation $OT Eval Moderate Complexity: 1 Mod OT Treatments $Self Care/Home Management : 8-22 mins  Maurie Boettcher, OT/L   Acute OT Clinical Specialist [place] Pager [phone] Office [phone]

## 2019-05-03 NOTE — Evaluation (Signed)
Physical Therapy Evaluation Patient Details Name: Hailey Cobb MRN: [id number] DOB: 1932/03/02 Today's Date: 05/03/2019   History of Present Illness  85 y.o. female with medical history significant of diastolic congestive heart failure, aortic stenosis, tachybradycardia syndrome, hypertension, subdural hematoma, seizure, hypothyroidism status post thyroidectomy, vulvar cancer on radiation treatment who presented with concerns of melena, increasing weakness and shortness of breath.   Clinical Impression  On eval, pt required mostly Min guard assist for mobility. She walked a short distance around the room. Pt presents with general weakness, decreased activity tolerance, and impaired gait and balance. Discussed d/c plan-pt plans to return home where she lives with her husband (she takes care of him). She has a daughter that can assist some as well. Recommend HHPT f/u and a home health aide, if possible Will follow during hospital stay.     Follow Up Recommendations Home health PT;Supervision - Intermittent; Home Health Aide    Equipment Recommendations  None recommended by PT    Recommendations for Other Services       Precautions / Restrictions Precautions Precautions: Fall Restrictions Weight Bearing Restrictions: No      Mobility  Bed Mobility Overal bed mobility: Modified Independent                Transfers Overall transfer level: Needs assistance Equipment used: None Transfers: Sit to/from Stand Sit to Stand: Min guard         General transfer comment: close guard for safety.  Ambulation/Gait Ambulation/Gait assistance: Min guard Gait Distance (Feet): 20 Feet Assistive device: None(furniture walked) Gait Pattern/deviations: Step-through pattern;Decreased stride length     General Gait Details: pt walked a short distance in the room. mostly min guard assist. She took some steps without UE support but also "furniture walkedClinical biochemist    Modified Rankin (Stroke Patients Only)       Balance Overall balance assessment: Needs assistance           Standing balance-Leahy Scale: Fair                               Pertinent Vitals/Pain Pain Assessment: No/denies pain    Home Living Family/patient expects to be discharged to:: Private residence Living Arrangements: Spouse/significant other;Children Available Help at Discharge: Available PRN/intermittently Type of Home: House Home Access: Ramped entrance     Home Layout: One level Home Equipment: Environmental consultant - 2 wheels;Cane - single point;Shower seat Additional Comments: husband has dementia, daughter assists as able    Prior Function Level of Independence: Independent with assistive device(s)         Comments: Has been using a RW just before admission. Was driving; takes care of her husband who has dementia     Hand Dominance        Extremity/Trunk Assessment   Upper Extremity Assessment Upper Extremity Assessment: Defer to OT evaluation    Lower Extremity Assessment Lower Extremity Assessment: Generalized weakness    Cervical / Trunk Assessment Cervical / Trunk Assessment: Normal  Communication   Communication: No difficulties  Cognition Arousal/Alertness: Awake/alert Behavior During Therapy: WFL for tasks assessed/performed Overall Cognitive Status: Within Functional Limits for tasks assessed  General Comments      Exercises     Assessment/Plan    PT Assessment Patient needs continued PT services  PT Problem List Decreased strength;Decreased mobility;Decreased activity tolerance;Decreased balance;Decreased knowledge of use of DME       PT Treatment Interventions DME instruction;Gait training;Therapeutic exercise;Therapeutic activities;Patient/family education;Balance training;Functional mobility training    PT Goals (Current goals can be  found in the Care Plan section)  Acute Rehab PT Goals Patient Stated Goal: to get stronger and back to being independent PT Goal Formulation: With patient Time For Goal Achievement: 05/17/19 Potential to Achieve Goals: Good    Frequency Min 3X/week   Barriers to discharge        Co-evaluation               AM-PAC PT "6 Clicks" Mobility  Outcome Measure Help needed turning from your back to your side while in a flat bed without using bedrails?: None Help needed moving from lying on your back to sitting on the side of a flat bed without using bedrails?: A Little Help needed moving to and from a bed to a chair (including a wheelchair)?: A Little Help needed standing up from a chair using your arms (e.g., wheelchair or bedside chair)?: A Little Help needed to walk in hospital room?: A Little Help needed climbing 3-5 steps with a railing? : A Little 6 Click Score: 19    End of Session   Activity Tolerance: Patient tolerated treatment well Patient left: in bed;with call bell/phone within reach   PT Visit Diagnosis: Unsteadiness on feet (R26.81);Muscle weakness (generalized) (M62.81)    Time: 5638-7564 PT Time Calculation (min) (ACUTE ONLY): 14 min   Charges:   PT Evaluation $PT Eval Moderate Complexity: [place], PT Acute Rehabilitation Services Pager: [phone] Office: [phone]

## 2019-05-03 NOTE — Progress Notes (Signed)
PROGRESS NOTE    Hailey Cobb  WGY:[id number] DOB: 08-Jul-1932 DOA: 05/02/2019 PCP: Hulan Fess, MD     Brief Narrative:  85 y.o. female with medical history significant of diastolic congestive heart failure, aortic stenosis, tachybradycardia syndrome, hypertension, subdural hematoma, seizure, hypothyroidism status post thyroidectomy, vulvar cancer on radiation treatment who presented with concerns of melena, increasing weakness and shortness of breath.  Patient reports that she had been feeling weak since starting radiation for her vulvar cancer in July.  About 3 days ago, she felt constipated and ate some prune and had 5 episodes of diarrhea with black tarry stool.  She again had another 4 episodes the following day.  She has noticed increased weakness and shortness of breath.  She presented to her oncologist for radiation today and was found to have worsening anemia of 8.5 down from 10.2 about 2 weeks ago.  She was then prompted to present to the emergency department for further evaluation.  Patient endorsed also feeling lightheadedness and dizziness.  She denies any chest pain.  Denies any abdominal pain.  She does feel nauseous at times but denies any vomiting.  She reports chronic dysuria ever since her cancer diagnosis.   ED Course: She was afebrile and normotensive on room air.  CBC showed leukopenia of 3.2.  Hemoglobin of 8.5 down from 10.9 two weeks ago.  CMP shows mildly low sodium of 131 and otherwise unremarkable.  Fecal occult blood positive.  Chest x-ray unremarkable.   Assessment & Plan: 1-GI bleed/melena: acute blood loss anemia in a patient with underlying anemia of chronic disease.  -Hemoglobin at 8.0 -Small dark stools early this morning -Continue to follow hemoglobin trend; transfusion threshold less than 8 given history of severe aortic stenosis. -Continue IV Protonix -Allow for clear liquid diet with n.p.o. after midnight status -Follow GI service recommendations.   2-hypertension -Continue amlodipine, Lasix, irbesartan and doxazosin -Blood pressure stable and well-controlled. -Follow vital signs  3-chronic diastolic heart failure/aortic stenosis -Stable and compensated -Follow daily weights and I's and O's  4-hyperlipidemia -Continue Crestor  5-hypothyroidism -Continue Synthroid  6-type 2 diabetes -Continue holding oral hypoglycemic agents -Continue sliding scale insulin.  7-History of seizure disorders -Continue Keppra -No signs of acute seizure appreciated.  8-Vulvar cancer  -continue radiation therapy.  9-physical deconditioning -Per physical therapy evaluation/recommendations will arrange home health PT and home health aide at time of discharge.   DVT prophylaxis: SCDs Code Status: Full code Family Communication: No family at bedside. Disposition Plan: Remains inpatient, continue IV Protonix, follow hemoglobin trend and transfuse as needed.  Will allow for clear liquid diet with n.p.o. after midnight status in case that GI work-up anticipated.  Consultants:   GI service ([facility]).  Procedures:   See below for x-ray reports.  Antimicrobials:  Anti-infectives (From admission, onward)   Start     Dose/Rate Route Frequency Ordered Stop   05/03/19 1000  fluconazole (DIFLUCAN) tablet 100 mg    Note to Pharmacy: Take 2 tablets on day 1     100 mg Oral Daily 05/02/19 2215        Subjective: Afebrile, no chest pain, no nausea, no vomiting.  Melanotic stool present in the morning.  Patient is feeling hungry.  Objective: Vitals:   05/02/19 2221 05/03/19 0433 05/03/19 0945 05/03/19 1334  BP: (!) 141/59 120/62 (!) 143/60 (!) 131/57  Pulse: 63 70  60  Resp: 18 18  19   Temp: 97.9 F (36.6 C) 98.2 F (36.8 C)  98.4 F (  36.9 C)  TempSrc: Oral Oral  Oral  SpO2: 95% 94%  92%  Weight: 84.9 kg     Height: 5\' 4"  (1.626 m)       Intake/Output Summary (Last 24 hours) at 05/03/2019 1610 Last data filed at 05/03/2019 1000  Gross per 24 hour  Intake 836.41 ml  Output -  Net 836.41 ml   Filed Weights   05/02/19 1445 05/02/19 2221  Weight: 89.8 kg 84.9 kg    Examination: General exam: Alert, awake, oriented x 3; no chest pain, no shortness of breath, no abdominal pain.  Patient reports feeling weak and tired.  Positive small dark stool early this morning.  Feeling hungry. Respiratory system: Clear to auscultation. Respiratory effort normal. Cardiovascular system:RRR.  Positive systolic ejection murmur, no rubs, no gallops, no JVD.   Gastrointestinal system: Abdomen is nondistended, soft and nontender. No organomegaly or masses felt. Normal bowel sounds heard. Central nervous system: Alert and oriented. No focal neurological deficits. Extremities: No C/C/E, +pedal pulses Skin: No rashes, lesions or ulcers Psychiatry: Judgement and insight appear normal. Mood & affect appropriate.     Data Reviewed: I have personally reviewed following labs and imaging studies  CBC: Recent Labs  Lab 05/02/19 1209 05/02/19 1646 05/02/19 2228 05/03/19 0416  WBC 3.3* 3.2* 2.6* 2.1*  NEUTROABS 2.7 2.7  --   --   HGB 8.2* 8.5* 8.6* 8.0*  HCT 25.4* 26.0* 26.3* 25.1*  MCV 89.1 89.7 89.8 90.6  PLT 126* 134* 127* 856*   Basic Metabolic Panel: Recent Labs  Lab 05/02/19 1209 05/02/19 1646 05/03/19 0416  NA 127* 131* 131*  K 4.7 4.2 3.7  CL 95* 96* 97*  CO2 27 28 26   GLUCOSE 173* 115* 104*  BUN 14 15 11   CREATININE 0.77 0.68 0.55  CALCIUM 9.4 9.4 9.1   GFR: Estimated Creatinine Clearance: 54.2 mL/min (by C-G formula based on SCr of 0.55 mg/dL).   Liver Function Tests: Recent Labs  Lab 05/02/19 1209 05/02/19 1646  AST 19 21  ALT 13 18  ALKPHOS 77 72  BILITOT 0.3 0.5  PROT 6.5 6.7  ALBUMIN 3.3* 3.5   Coagulation Profile: Recent Labs  Lab 05/02/19 1646  INR 1.0   CBG: Recent Labs  Lab 05/02/19 2228 05/03/19 0839 05/03/19 1153  GLUCAP 112* 123* 114*   Urine analysis:    Component Value  Date/Time   COLORURINE YELLOW 04/14/2019 1118   APPEARANCEUR HAZY (A) 04/14/2019 1118   LABSPEC 1.011 04/14/2019 1118   PHURINE 6.0 04/14/2019 1118   GLUCOSEU NEGATIVE 04/14/2019 1118   [place] 04/14/2019 1118   [place] 04/14/2019 1118   [place] 04/14/2019 1118   PROTEINUR NEGATIVE 04/14/2019 1118   UROBILINOGEN 0.2 07/23/2014 1445   NITRITE NEGATIVE 04/14/2019 1118   LEUKOCYTESUR MODERATE (A) 04/14/2019 1118    Recent Results (from the past 240 hour(s))  SARS CORONAVIRUS 2 (TAT 6-24 HRS) Nasopharyngeal Nasopharyngeal Swab     Status: None   Collection Time: 05/02/19  6:32 PM   Specimen: Nasopharyngeal Swab  Result Value Ref Range Status   SARS Coronavirus 2 NEGATIVE NEGATIVE Final    Comment: (NOTE) SARS-CoV-2 target nucleic acids are NOT DETECTED. The SARS-CoV-2 RNA is generally detectable in upper and lower respiratory specimens during the acute phase of infection. Negative results do not preclude SARS-CoV-2 infection, do not rule out co-infections with other pathogens, and should not be used as the sole basis for treatment or other patient management decisions. Negative results must  be combined with clinical observations, patient history, and epidemiological information. The expected result is Negative. Fact Sheet for Patients: SugarRoll.be Fact Sheet for Healthcare Providers: https://www.woods-mathews.com/ This test is not yet approved or cleared by the [place] FDA and  has been authorized for detection and/or diagnosis of SARS-CoV-2 by FDA under an Emergency Use Authorization (EUA). This EUA will remain  in effect (meaning this test can be used) for the duration of the COVID-19 declaration under Section 56 4(b)(1) of the Act, 21 U.S.C. section 360bbb-3(b)(1), unless the authorization is terminated or revoked sooner. Performed at [facility], [place] [address], [place], [place] [address]       Radiology Studies: Dg Chest Portable 1 View  Result Date: 05/02/2019 CLINICAL DATA:  Weakness EXAM: PORTABLE CHEST 1 VIEW COMPARISON:  12/17/2018 FINDINGS: Cardiomegaly. No focal opacity or pleural effusion. No pneumothorax. Aortic atherosclerosis. Bilateral shoulder replacements IMPRESSION: No active disease.  Cardiomegaly Electronically Signed   By: Donavan Foil M.D.   On: 05/02/2019 16:40     Scheduled Meds: . ALPRAZolam  0.5 mg Oral QHS  . amLODipine  10 mg Oral Daily  . doxazosin  4 mg Oral Daily  . doxazosin  8 mg Oral QHS  . fluconazole  100 mg Oral Daily  . furosemide  20 mg Oral Daily  . insulin aspart  0-9 Units Subcutaneous TID WC  . irbesartan  150 mg Oral BID  . levETIRAcetam  500 mg Oral Daily  . levothyroxine  125 mcg Oral QAC breakfast  . pantoprazole (PROTONIX) IV  40 mg Intravenous QHS  . rosuvastatin  20 mg Oral Once per day on Mon Wed Fri   Continuous Infusions: . sodium chloride 75 mL/hr at 05/03/19 1157     LOS: 1 day    Time spent: 30 minutes.    Barton Dubois, MD [facility] Pager [phone]   05/03/2019, 4:10 PM

## 2019-05-04 ENCOUNTER — Ambulatory Visit
Admission: RE | Admit: 2019-05-04 | Discharge: 2019-05-04 | Disposition: A | Discharge status: Home / Self Care | Payer: Medicare Other | Source: Ambulatory Visit | Attending: Radiation Oncology | Admitting: Radiation Oncology

## 2019-05-04 DIAGNOSIS — E1159 Type 2 diabetes mellitus with other circulatory complications: Secondary | ICD-10-CM

## 2019-05-04 DIAGNOSIS — C519 Malignant neoplasm of vulva, unspecified: Secondary | ICD-10-CM

## 2019-05-04 DIAGNOSIS — Z794 Long term (current) use of insulin: Secondary | ICD-10-CM

## 2019-05-04 LAB — GLUCOSE, CAPILLARY
Glucose-Capillary: 114 mg/dL — ABNORMAL HIGH (ref 70–99)
Glucose-Capillary: 129 mg/dL — ABNORMAL HIGH (ref 70–99)
Glucose-Capillary: 134 mg/dL — ABNORMAL HIGH (ref 70–99)
Glucose-Capillary: 141 mg/dL — ABNORMAL HIGH (ref 70–99)

## 2019-05-04 LAB — CBC
HCT: 25.3 % — ABNORMAL LOW (ref 36.0–46.0)
HCT: 27.1 % — ABNORMAL LOW (ref 36.0–46.0)
Hemoglobin: 7.9 g/dL — ABNORMAL LOW (ref 12.0–15.0)
Hemoglobin: 8.6 g/dL — ABNORMAL LOW (ref 12.0–15.0)
MCH: 28.5 pg (ref 26.0–34.0)
MCH: 29.7 pg (ref 26.0–34.0)
MCHC: 31.2 g/dL (ref 30.0–36.0)
MCHC: 31.7 g/dL (ref 30.0–36.0)
MCV: 91.3 fL (ref 80.0–100.0)
MCV: 93.4 fL (ref 80.0–100.0)
Platelets: 124 10*3/uL — ABNORMAL LOW (ref 150–400)
Platelets: 148 10*3/uL — ABNORMAL LOW (ref 150–400)
RBC: 2.77 MIL/uL — ABNORMAL LOW (ref 3.87–5.11)
RBC: 2.9 MIL/uL — ABNORMAL LOW (ref 3.87–5.11)
RDW: 16 % — ABNORMAL HIGH (ref 11.5–15.5)
RDW: 16.4 % — ABNORMAL HIGH (ref 11.5–15.5)
WBC: 2.7 10*3/uL — ABNORMAL LOW (ref 4.0–10.5)
WBC: 3 10*3/uL — ABNORMAL LOW (ref 4.0–10.5)
nRBC: 0 % (ref 0.0–0.2)
nRBC: 0 % (ref 0.0–0.2)

## 2019-05-04 NOTE — Progress Notes (Addendum)
PROGRESS NOTE    Hailey Cobb  EUM:[id number] DOB: [birth year] DOA: 05/02/2019 PCP: Hulan Fess, MD    Brief Narrative:   85 y.o. female with medical history significant of diastolic congestive heart failure, severe aortic stenosis, tachybradycardia syndrome, hypertension, subdural hematoma, seizure, hypothyroidism status post thyroidectomy, vulvar cancer on radiation treatment who presented to the hospital with concerns of melena, increasing weakness and shortness of breath.  Patient reports that she had been feeling weak since starting radiation for her vulvar cancer in July.  About 3 days ago, she felt constipated and ate some prune juice and had 5 episodes of diarrhea with black tarry stool.  She again had another 4 episodes the following day.  She has noticed increased weakness and shortness of breath.  She presented to her radiation oncologist and was found to have worsening anemia of 8.5 down from 10.2 about 2 weeks ago.  She was then prompted to present to the emergency department for further evaluation.  Patient endorsed also feeling lightheadedness and dizziness.    ED Course: She was afebrile and normotensive on room air.  CBC showed leukopenia of 3.2.  Hemoglobin of 8.5 down from 10.9 two weeks ago.  CMP shows mildly low sodium of 131 and otherwise unremarkable.  Fecal occult blood positive.  Chest x-ray was unremarkable.   Assessment & Plan:  Principal Problem:   Acute GI bleeding Active Problems:   Diabetes mellitus ([place])   Hypothyroidism   Essential hypertension   Aortic stenosis   Chronic diastolic CHF (congestive heart failure) (HCC)   Primary vulvar squamous cell carcinoma (HCC)  GI bleed/melena with acute blood loss anemia in a patient with underlying anemia of chronic disease.   Hemoglobin of 7.9.  Transfuse for trending down hemoglobin.  Patient does have history of severe aortic stenosis and history of complications with anesthesia's.  GI has seen the patient and  is recommending upper GI series due to risk of endoscopic procedure with anesthesia and previous history of cardiac dysrhythmia on anesthesia.  Continue on IV Protonix.  Start clears.  Essential hypertension -Continue amlodipine, Lasix, irbesartan and doxazosin.  Blood pressure is stable at this time.  Will closely monitor.  Chronic diastolic heart failure/severe aortic stenosis. Resume home medications.  hyperlipidemia -Continue Crestor   Hypothyroidism -Continue Synthroid  Diabetes mellitus type II.  Continue to hold oral hypoglycemic events.  Continue sliding scale insulin Accu-Cheks.  Currently on clears.  Will closely monitor blood glucose levels.  History of seizure disorder -Continue Keppra  Vulvar cancer  -continue radiation therapy.  Nonsurgical.  Debility, physical deconditioning - home health PT and home health aide at time of discharge.   DVT prophylaxis: SCDs  Code Status: Full code  Family Communication: I spoke with the patient's daughter on the phone and updated her about the clinical condition of the patient and the plan for further work-up.  Disposition Plan: Continue IV Protonix.  Transfuse as necessary.  Follow GI recommendation.  Follow upper GI series.  Consultants:   GI service ([facility]).  Procedures:  None  Antimicrobials:  Anti-infectives (From admission, onward)   Start     Dose/Rate Route Frequency Ordered Stop   05/03/19 1000  fluconazole (DIFLUCAN) tablet 100 mg    Note to Pharmacy: Take 2 tablets on day 1     100 mg Oral Daily 05/02/19 2215       Subjective: Patient denies any chest pain, shortness of breath, hematemesis or melena today.  Denies any abdominal pain, dizziness,  lightheadedness or increasing dyspnea.  Wondering about her plan of treatment.  Objective: Vitals:   05/03/19 0945 05/03/19 1334 05/03/19 2053 05/04/19 0554  BP: (!) 143/60 (!) 131/57 (!) 127/57 (!) 130/58  Pulse:  60 63 66  Resp:  19 18 18   Temp:  98.4 F  (36.9 C) 97.9 F (36.6 C) 98.3 F (36.8 C)  TempSrc:  Oral Oral Oral  SpO2:  92% 94% 98%  Weight:      Height:        Intake/Output Summary (Last 24 hours) at 05/04/2019 1305 Last data filed at 05/04/2019 0600 Gross per 24 hour  Intake 2159.63 ml  Output 0 ml  Net 2159.63 ml   Filed Weights   05/02/19 1445 05/02/19 2221  Weight: 89.8 kg 84.9 kg    Examination: General:  Average built, not in obvious distress HENT: Normocephalic, pupils equally reacting to light and accommodation.  No scleral  icterus noted. Oral mucosa is moist.  Chest:  Diminished breath sounds bilaterally. No crackles or wheezes.  CVS: S1 &S2 heard.  Systolic murmur noted regular rate and rhythm. Abdomen: Soft, nontender, nondistended.  Bowel sounds are heard.  Liver is not palpable, no abdominal mass palpated Extremities: No cyanosis, clubbing or edema.  Peripheral pulses are palpable. Psych: Alert, awake and oriented, normal mood CNS:  No cranial nerve deficits.  Power equal in all extremities.  No sensory deficits noted.  No cerebellar signs.   Skin: Warm and dry.  No rashes noted.  Data Reviewed: I have personally reviewed following labs and imaging studies  CBC: Recent Labs  Lab 05/02/19 1209 05/02/19 1646 05/02/19 2228 05/03/19 0416 05/03/19 1618 05/04/19 0448  WBC 3.3* 3.2* 2.6* 2.1* 2.7* 2.7*  NEUTROABS 2.7 2.7  --   --   --   --   HGB 8.2* 8.5* 8.6* 8.0* 8.7* 7.9*  HCT 25.4* 26.0* 26.3* 25.1* 27.1* 25.3*  MCV 89.1 89.7 89.8 90.6 90.0 91.3  PLT 126* 134* 127* 127* 135* 341*   Basic Metabolic Panel: Recent Labs  Lab 05/02/19 1209 05/02/19 1646 05/03/19 0416  NA 127* 131* 131*  K 4.7 4.2 3.7  CL 95* 96* 97*  CO2 27 28 26   GLUCOSE 173* 115* 104*  BUN 14 15 11   CREATININE 0.77 0.68 0.55  CALCIUM 9.4 9.4 9.1   GFR: Estimated Creatinine Clearance: 54.2 mL/min (by C-G formula based on SCr of 0.55 mg/dL).   Liver Function Tests: Recent Labs  Lab 05/02/19 1209 05/02/19 1646   AST 19 21  ALT 13 18  ALKPHOS 77 72  BILITOT 0.3 0.5  PROT 6.5 6.7  ALBUMIN 3.3* 3.5   Coagulation Profile: Recent Labs  Lab 05/02/19 1646  INR 1.0   CBG: Recent Labs  Lab 05/03/19 1153 05/03/19 1704 05/03/19 2138 05/04/19 0734 05/04/19 1134  GLUCAP 114* 109* 147* 114* 129*   Urine analysis:    Component Value Date/Time   COLORURINE YELLOW 04/14/2019 1118   APPEARANCEUR HAZY (A) 04/14/2019 1118   LABSPEC 1.011 04/14/2019 1118   PHURINE 6.0 04/14/2019 1118   GLUCOSEU NEGATIVE 04/14/2019 1118   [place] 04/14/2019 1118   [place] 04/14/2019 [place] 04/14/2019 1118   PROTEINUR NEGATIVE 04/14/2019 1118   UROBILINOGEN 0.2 07/23/2014 1445   NITRITE NEGATIVE 04/14/2019 1118   LEUKOCYTESUR MODERATE (A) 04/14/2019 1118    Recent Results (from the past 240 hour(s))  SARS CORONAVIRUS 2 (TAT 6-24 HRS) Nasopharyngeal Nasopharyngeal Swab     Status: None  Collection Time: 05/02/19  6:32 PM   Specimen: Nasopharyngeal Swab  Result Value Ref Range Status   SARS Coronavirus 2 NEGATIVE NEGATIVE Final    Comment: (NOTE) SARS-CoV-2 target nucleic acids are NOT DETECTED. The SARS-CoV-2 RNA is generally detectable in upper and lower respiratory specimens during the acute phase of infection. Negative results do not preclude SARS-CoV-2 infection, do not rule out co-infections with other pathogens, and should not be used as the sole basis for treatment or other patient management decisions. Negative results must be combined with clinical observations, patient history, and epidemiological information. The expected result is Negative. Fact Sheet for Patients: SugarRoll.be Fact Sheet for Healthcare Providers: https://www.woods-mathews.com/ This test is not yet approved or cleared by the [place] FDA and  has been authorized for detection and/or diagnosis of SARS-CoV-2 by FDA under an Emergency Use  Authorization (EUA). This EUA will remain  in effect (meaning this test can be used) for the duration of the COVID-19 declaration under Section 56 4(b)(1) of the Act, 21 U.S.C. section 360bbb-3(b)(1), unless the authorization is terminated or revoked sooner. Performed at [facility], [place] [address] [place], [place] [address]      Radiology Studies: Dg Chest Portable 1 View  Result Date: 05/02/2019 CLINICAL DATA:  Weakness EXAM: PORTABLE CHEST 1 VIEW COMPARISON:  12/17/2018 FINDINGS: Cardiomegaly. No focal opacity or pleural effusion. No pneumothorax. Aortic atherosclerosis. Bilateral shoulder replacements IMPRESSION: No active disease.  Cardiomegaly Electronically Signed   By: Donavan Foil M.D.   On: 05/02/2019 16:40     Scheduled Meds: . ALPRAZolam  0.5 mg Oral QHS  . amLODipine  10 mg Oral Daily  . doxazosin  4 mg Oral Daily  . doxazosin  8 mg Oral QHS  . fluconazole  100 mg Oral Daily  . furosemide  20 mg Oral Daily  . insulin aspart  0-9 Units Subcutaneous TID WC  . irbesartan  150 mg Oral BID  . levETIRAcetam  500 mg Oral Daily  . levothyroxine  125 mcg Oral QAC breakfast  . pantoprazole (PROTONIX) IV  40 mg Intravenous QHS  . rosuvastatin  20 mg Oral Once per day on Mon Wed Fri   Continuous Infusions: . sodium chloride 75 mL/hr at 05/04/19 1213     LOS: 2 days   Flora Lipps, MD [facility]  05/04/2019, 1:05 PM

## 2019-05-04 NOTE — TOC Progression Note (Signed)
Transition of Care [facility]) - Progression Note    Patient Details  Name: Hailey Cobb MRN: [id number] Date of Birth: [birth year]  [facility]) CM/SW Contact  Agape Hardiman, Juliann Pulse, RN Phone Number: 05/04/2019, 11:48 AM  Clinical Narrative:Spoke to dtr [name] [phone] agrees to [facility] 1st program rep Tommi Rumps aware-await HHRN/PT/OT/aide order. Has dme:rw,cane,3n1. Family will provide own transport home.            Expected Discharge Plan and Services                                                 Social Determinants of Health (SDOH) Interventions    Readmission Risk Interventions No flowsheet data found.
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
## 2019-05-05 ENCOUNTER — Inpatient Hospital Stay (HOSPITAL_COMMUNITY): Payer: Medicare Other

## 2019-05-05 ENCOUNTER — Ambulatory Visit
Admission: RE | Admit: 2019-05-05 | Discharge: 2019-05-05 | Disposition: A | Discharge status: Home / Self Care | Payer: Medicare Other | Source: Ambulatory Visit | Attending: Radiation Oncology | Admitting: Radiation Oncology

## 2019-05-05 LAB — GLUCOSE, CAPILLARY
Glucose-Capillary: 137 mg/dL — ABNORMAL HIGH (ref 70–99)
Glucose-Capillary: 111 mg/dL — ABNORMAL HIGH (ref 70–99)
Glucose-Capillary: 169 mg/dL — ABNORMAL HIGH (ref 70–99)
Glucose-Capillary: 101 mg/dL — ABNORMAL HIGH (ref 70–99)

## 2019-05-05 LAB — COMPREHENSIVE METABOLIC PANEL
ALT: 13 U/L (ref 0–44)
AST: 18 U/L (ref 15–41)
Albumin: 3.1 g/dL — ABNORMAL LOW (ref 3.5–5.0)
Alkaline Phosphatase: 61 U/L (ref 38–126)
Anion gap: 6 (ref 5–15)
BUN: <5 mg/dL — ABNORMAL LOW (ref 8–23)
CO2: 25 mmol/L (ref 22–32)
Calcium: 8.9 mg/dL (ref 8.9–10.3)
Chloride: 105 mmol/L (ref 98–111)
Creatinine, Ser: 0.46 mg/dL (ref 0.44–1.00)
GFR calc Af Amer: >60 mL/min (ref >60)
GFR calc non Af Amer: >60 mL/min (ref >60)
Glucose, Bld: 115 mg/dL — ABNORMAL HIGH (ref 70–99)
Potassium: 3.4 mmol/L — ABNORMAL LOW (ref 3.5–5.1)
Sodium: 136 mmol/L (ref 135–145)
Total Bilirubin: 0.6 mg/dL (ref 0.3–1.2)
Total Protein: 5.8 g/dL — ABNORMAL LOW (ref 6.5–8.1)

## 2019-05-05 LAB — MAGNESIUM: Magnesium: 1.7 mg/dL (ref 1.7–2.4)

## 2019-05-05 MED ORDER — PANTOPRAZOLE SODIUM 40 MG IV SOLR
40.0000 mg | Freq: Two times a day (BID) | INTRAVENOUS | Status: DC
  Administered 2019-05-05 – 2019-05-08 (×7): 40 mg via INTRAVENOUS
  Filled 2019-05-05 (×7): qty 40

## 2019-05-05 MED ORDER — POTASSIUM CHLORIDE CRYS ER 20 MEQ PO TBCR
40.0000 meq | EXTENDED_RELEASE_TABLET | Freq: Once | ORAL | Status: AC
  Administered 2019-05-05: 40 meq via ORAL
  Filled 2019-05-05: qty 2

## 2019-05-05 NOTE — Progress Notes (Signed)
[facility] PROGRESS NOTE Subjective Patient is going down today for upper GI series.  She has had no more gross bleeding.  The reason for obtaining an upper GI is that she has had a lot of cardiac problems and had some sort of severe cardiac arrhythmia at [facility] causing a operation to be terminated for her vulular cancer.  Somewhere along the way the cardiologist told her that she should never be put to sleep again because of her severe cardiac problems.  She did drop her hemoglobin but is not actively bleeding and is on PPI therapy.  Objective: Vital signs in last 24 hours: Temp:  [98.3 F (36.8 C)-98.4 F (36.9 C)] 98.3 F (36.8 C) (09/18 0543) Pulse Rate:  [63-65] 65 (09/18 0543) Resp:  [18-19] 18 (09/18 0543) BP: (109-135)/(49-65) 132/65 (09/18 0543) SpO2:  [93 %-98 %] 98 % (09/18 0543) Last BM Date: 05/04/19  Intake/Output from previous day: 09/17 0701 - 09/18 0700 In: 2200.1 [P.O.:600; I.V.:1600.1] Out: 0  Intake/Output this shift: No intake/output data recorded.  PE: General--alert and oriented.  She wants to reconfirm that she does not need anesthesia for an upper GI series.  I did assure her that was the case Abdomen--soft and nontender  Lab Results: Recent Labs    05/02/19 2228 05/03/19 0416 05/03/19 1618 05/04/19 0448 05/04/19 1555  WBC 2.6* 2.1* 2.7* 2.7* 3.0*  HGB 8.6* 8.0* 8.7* 7.9* 8.6*  HCT 26.3* 25.1* 27.1* 25.3* 27.1*  PLT 127* 127* 135* 124* 148*   BMET Recent Labs    05/02/19 1209 05/02/19 1646 05/03/19 0416 05/05/19 0419  NA 127* 131* 131* 136  K 4.7 4.2 3.7 3.4*  CL 95* 96* 97* 105  CO2 27 28 26 25   CREATININE 0.77 0.68 0.55 0.46   LFT Recent Labs    05/02/19 1209 05/02/19 1646 05/05/19 0419  PROT 6.5 6.7 5.8*  AST 19 21 18   ALT 13 18 13   ALKPHOS 77 72 61  BILITOT 0.3 0.5 0.6   PT/INR Recent Labs    05/02/19 1646  LABPROT 13.3  INR 1.0   PANCREAS No results for input(s): LIPASE in the last 72  hours.       Studies/Results: No results found.  Medications: I have reviewed the patient's current medications.  Assessment:   1.  Heme positive stool/anemia.  Patient appears to be fairly stable she did drop her hemoglobin some.  Upper GI series due to concerns about anesthesia has been ordered and should be obtained today.   Plan: Reinforced to the patient that if she bleeds acutely, vomits blood etc. we would need to put her under sedation and perform endoscopy but will go ahead at this point with upper GI series.  Dr Alessandra Bevels will be seeing her this weekend.   Nancy Fetter 05/05/2019, 8:01 AM  This note was created using voice recognition software. Minor errors may Have occurred unintentionally.  Pager: [phone] If no answer or after hours call [phone]

## 2019-05-05 NOTE — Progress Notes (Addendum)
PROGRESS NOTE    Hailey Cobb  GYI:[id number]  DOB: 1931-08-31  DOA: 05/02/2019 PCP: Hulan Fess, MD  Brief Narrative:  85 y.o.femalewith medical history significant ofdiastolic congestive heart failure, severe aortic stenosis, tachybradycardia syndrome, hypertension, subdural hematoma, seizure, hypothyroidism status post thyroidectomy, vulvar cancer on radiation treatment who presented to the hospital with concerns of melena, increasing weakness, dizziness and shortness of breath. Patient reports that she had been feeling weak since starting radiation for her vulvar cancer in July. About 3 days PTA,  she had 5 episodes of diarrhea with black tarry stool after she took prune juice for constipation. She again hadanother 4 episodes the following day.She presented to her radiation oncologist and was found to have Hgb 8.5, down from 10.2 about 2 weeks ago. She was then referred to the ED. Labs in ED confirmed anemia, WBC count was 3.2, CMP showed sodium of 131 and otherwise labs were unremarkable.Fecal occult blood positive. Chest x-ray was unremarkable.  Subjective:  Patient reports feeling weak, dyspneic and dizzy when she walked to the bathroom earlier today.  Reports brown stool day before yesterday but one episode of melena yesterday. Objective: Vitals:   05/05/19 1405 05/05/19 2112 05/06/19 0438 05/06/19 1520  BP: (!) 111/56 124/61 (!) 119/49 (!) 116/57  Pulse: 66 63 64 61  Resp: 16 20 20 20   Temp: 97.9 F (36.6 C) 97.7 F (36.5 C) 97.7 F (36.5 C) 98.2 F (36.8 C)  TempSrc: Oral Oral Oral Oral  SpO2:  100% 93% 94%  Weight:      Height:        Intake/Output Summary (Last 24 hours) at 05/06/2019 1648 Last data filed at 05/06/2019 0700 Gross per 24 hour  Intake 1475 ml  Output -  Net 1475 ml   Filed Weights   05/02/19 1445 05/02/19 2221  Weight: 89.8 kg 84.9 kg    Physical Examination:  General exam: Appears tired but in no acute distress Respiratory  system: Clear to auscultation. Respiratory effort normal. Cardiovascular system: S1 & S2 heard, loud systolic ejection murmur. No JVD, murmurs, rubs, gallops or clicks. No pedal edema. Gastrointestinal system: Abdomen is nondistended, soft and nontender. No organomegaly or masses felt. Normal bowel sounds heard. Central nervous system: Alert and oriented. No focal neurological deficits. Extremities: Symmetric but generalized weakness with strength 4/5 in all extremities Skin: No rashes, lesions or ulcers Psychiatry: Judgement and insight appear normal. Mood & affect appropriate.     Data Reviewed: I have personally reviewed following labs and imaging studies  CBC: Recent Labs  Lab 05/02/19 1209 05/02/19 1646  05/03/19 0416 05/03/19 1618 05/04/19 0448 05/04/19 1555 05/06/19 0356  WBC 3.3* 3.2*   < > 2.1* 2.7* 2.7* 3.0* 2.6*  NEUTROABS 2.7 2.7  --   --   --   --   --   --   HGB 8.2* 8.5*   < > 8.0* 8.7* 7.9* 8.6* 7.7*  HCT 25.4* 26.0*   < > 25.1* 27.1* 25.3* 27.1* 25.1*  MCV 89.1 89.7   < > 90.6 90.0 91.3 93.4 93.7  PLT 126* 134*   < > 127* 135* 124* 148* 122*   < > = values in this interval not displayed.   Basic Metabolic Panel: Recent Labs  Lab 05/02/19 1209 05/02/19 1646 05/03/19 0416 05/05/19 0419 05/06/19 0356  NA 127* 131* 131* 136 136  K 4.7 4.2 3.7 3.4* 3.8  CL 95* 96* 97* 105 104  CO2 27 28 26 25  25  GLUCOSE 173* 115* 104* 115* 122*  BUN 14 15 11  <5* 6*  CREATININE 0.77 0.68 0.55 0.46 0.50  CALCIUM 9.4 9.4 9.1 8.9 8.7*  MG  --   --   --  1.7  --    GFR: Estimated Creatinine Clearance: 54.2 mL/min (by C-G formula based on SCr of 0.5 mg/dL). Liver Function Tests: Recent Labs  Lab 05/02/19 1209 05/02/19 1646 05/05/19 0419  AST 19 21 18   ALT 13 18 13   ALKPHOS 77 72 61  BILITOT 0.3 0.5 0.6  PROT 6.5 6.7 5.8*  ALBUMIN 3.3* 3.5 3.1*   No results for input(s): LIPASE, AMYLASE in the last 168 hours. No results for input(s): AMMONIA in the last 168 hours.  Coagulation Profile: Recent Labs  Lab 05/02/19 1646  INR 1.0   Cardiac Enzymes: No results for input(s): CKTOTAL, CKMB, CKMBINDEX, TROPONINI in the last 168 hours. BNP (last 3 results) No results for input(s): PROBNP in the last 8760 hours. HbA1C: No results for input(s): HGBA1C in the last 72 hours. CBG: Recent Labs  Lab 05/05/19 1703 05/05/19 2108 05/06/19 0727 05/06/19 1203 05/06/19 1600  GLUCAP 169* 101* 134* 183* 115*   Lipid Profile: No results for input(s): CHOL, HDL, LDLCALC, TRIG, CHOLHDL, LDLDIRECT in the last 72 hours. Thyroid Function Tests: No results for input(s): TSH, T4TOTAL, FREET4, T3FREE, THYROIDAB in the last 72 hours. Anemia Panel: No results for input(s): VITAMINB12, FOLATE, FERRITIN, TIBC, IRON, RETICCTPCT in the last 72 hours. Sepsis Labs: No results for input(s): PROCALCITON, LATICACIDVEN in the last 168 hours.  Recent Results (from the past 240 hour(s))  SARS CORONAVIRUS 2 (TAT 6-24 HRS) Nasopharyngeal Nasopharyngeal Swab     Status: None   Collection Time: 05/02/19  6:32 PM   Specimen: Nasopharyngeal Swab  Result Value Ref Range Status   SARS Coronavirus 2 NEGATIVE NEGATIVE Final    Comment: (NOTE) SARS-CoV-2 target nucleic acids are NOT DETECTED. The SARS-CoV-2 RNA is generally detectable in upper and lower respiratory specimens during the acute phase of infection. Negative results do not preclude SARS-CoV-2 infection, do not rule out co-infections with other pathogens, and should not be used as the sole basis for treatment or other patient management decisions. Negative results must be combined with clinical observations, patient history, and epidemiological information. The expected result is Negative. Fact Sheet for Patients: SugarRoll.be Fact Sheet for Healthcare Providers: https://www.woods-mathews.com/ This test is not yet approved or cleared by the [place] FDA and  has been authorized  for detection and/or diagnosis of SARS-CoV-2 by FDA under an Emergency Use Authorization (EUA). This EUA will remain  in effect (meaning this test can be used) for the duration of the COVID-19 declaration under Section 56 4(b)(1) of the Act, 21 U.S.C. section 360bbb-3(b)(1), unless the authorization is terminated or revoked sooner. Performed at [facility], [place] [address], [place], [place] [address]       Radiology Studies: Dg Paulene Floor Double Cm (hd Ba)  Result Date: 05/05/2019 CLINICAL DATA:  Heme-positive stool and anemia. Patient cannot undergo sedation for endoscopy. EXAM: UPPER GI SERIES WITH KUB TECHNIQUE: After obtaining a scout radiograph a routine double-contrast upper GI series was performed using thin and high density barium. FLUOROSCOPY TIME:  Fluoroscopy Time:  3 minutes 12 seconds Radiation Exposure Index (if provided by the fluoroscopic device): 40.6 mGy Number of Acquired Spot Images: 13 COMPARISON:  CT abdomen pelvis dated 04/27/2019 and esophagram dated 01/05/2019. FINDINGS: The esophagus demonstrates mild esophageal dysmotility. There is no evidence  of intrinsic or extrinsic mass or polyp. No constricting or obstructing lesion is identified. There is no mucosal ulceration. Mucosal folds are normal in appearance. Mild gastroesophageal reflux is seen. The stomach has no evidence of intrinsic or extrinsic mass or polyp. No mucosal ulceration is identified. Mucosal folds are normal in appearance. A small hiatal hernia noted. The duodenum and visualized jejunum have no evidence of intrinsic or extrinsic mass or polyp. No mucosal ulceration is identified. Mucosal folds are normal in appearance. IMPRESSION: No findings to explain heme-positive stool and anemia. Electronically Signed   By: Zerita Boers M.D.   On: 05/05/2019 10:58        Scheduled Meds: . ALPRAZolam  0.5 mg Oral QHS  . amLODipine  10 mg Oral Daily  . doxazosin  4 mg Oral Daily  . doxazosin  8 mg Oral QHS  .  fluconazole  100 mg Oral Daily  . furosemide  20 mg Oral Daily  . insulin aspart  0-9 Units Subcutaneous TID WC  . irbesartan  150 mg Oral BID  . levETIRAcetam  500 mg Oral Daily  . levothyroxine  125 mcg Oral QAC breakfast  . pantoprazole (PROTONIX) IV  40 mg Intravenous Q12H  . rosuvastatin  20 mg Oral Once per day on Mon Wed Fri   Continuous Infusions:  Assessment & Plan:   1.UGIB : Hemoglobin dropped to 7.7 and patient feeling symptomatic.  Could be secondary to recurrent GI bleed versus dilutional (as WBC and platelet counts dropped as well) in the setting of IV fluids.  Requested nurse to provide a toilet hat and monitor for bloody stools.  Will DC IV fluids, repeat hemoglobin and transfuse if <  8 in the context of severe valvular heart disease. Patient does have history of complications (arrythmia) with anesthesia due to severe aortic stenosis. GI has seen the patient and recommended upper GI series due to high risk for endoscopic procedure with anesthesia. She underwent upper GI series on 9/18 revealing no significant findings except mild GERD.Continue on IV Protonix.  Advanced  diet.  2.Essential hypertension:Continue amlodipine, Lasix, irbesartan and doxazosin.  Blood pressure is stable at this time.  Will closely monitor.  3.Chronic diastolic heart failure/severe aortic stenosis.Resumed home medications.  4. Hyperlipidemia-Continue Crestor  5.Hypothyroidism:Continue Synthroid  6.Diabetes mellitus type II.  Continue to hold oral hypoglycemic events.  Continue sliding scale insulin Accu-Cheks.  Currently on clears.  Will closely monitor blood glucose levels.  7.History of seizure disorder-Continue Keppra  8.Vulvar cancer  F/U primary Oncology for-continued radiation therapy. Nonsurgical.  9.Debility, physical deconditioning- home health PT and home health aide at time of discharge.   DVT prophylaxis: SCDs in the setting of problem #1 Code Status: Full code Family /  Patient Communication: Discussed with patient, all questions answered.  She is agreeable for blood transfusion if needed Disposition Plan: Home with home PT/home health aide in next 24 to 48 hours     LOS: 4 days    Time spent: 35 minutes    Guilford Shi, MD [facility] Pager [phone]  If 7PM-7AM, please contact night-coverage [email or web address] Password [facility] 05/06/2019, 4:48 PM

## 2019-05-05 NOTE — Care Management Important Message (Signed)
Important Message  Patient Details IM Letter given to Dessa Phi RN to present to the Patient Name: Hailey Cobb MRN: [id number] Date of Birth: [birth year]   Medicare Important Message Given:  Yes     Kerin Salen 05/05/2019, 11:41 AM

## 2019-05-05 NOTE — Progress Notes (Signed)
PT Cancellation Note  Patient Details Name: Hailey Cobb MRN: [id number] DOB: October 18, 1931   Cancelled Treatment:    Reason Eval/Treat Not Completed: Patient at procedure or test/unavailable   [facility] 05/05/2019, 10:46 AM

## 2019-05-05 NOTE — Progress Notes (Signed)
Occupational Therapy Treatment Patient Details Name: Hailey Cobb MRN: [id number] DOB: [birth year] Today's Date: 05/05/2019    History of present illness 85 y.o. female with medical history significant of diastolic congestive heart failure, aortic stenosis, tachybradycardia syndrome, hypertension, subdural hematoma, seizure, hypothyroidism status post thyroidectomy, vulvar cancer on radiation treatment who presented with concerns of melena, increasing weakness and shortness of breath.    OT comments  Pt had XRT earlier and was fatiqued. Was sitting EOB when OT arrived. Educated on energy conservation.  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 Her daughter is helping, but pt reports that she also has health issues.     Follow Up Recommendations  Home health OT;Supervision - Intermittent; [place] aide if possible   Equipment Recommendations  3 in 1 bedside commode    Recommendations for Other Services      Precautions / Restrictions Precautions Precautions: Fall Restrictions Weight Bearing Restrictions: No       Mobility Bed Mobility                  Transfers                      Balance                                           ADL either performed or assessed with clinical judgement   ADL                                         General ADL Comments: pt sitting EOB for lunch.  She had xrt earlier and did not feel up to completing ADL today.  Educated on energy conservation. 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  Educated on AE, but she states she has tried it in the past.  It didn't work for her.  She verbalizes that she has to take rest breaks.   Encouraged her to perform short burst of activity, breaking activities up and sitting up a few times a day for short durations.  Pt reports that she feels raw in her peri area     Vision       Perception     Praxis      Cognition Arousal/Alertness:  Awake/alert Behavior During Therapy: WFL for tasks assessed/performed Overall Cognitive Status: Within Functional Limits for tasks assessed                                          Exercises     Shoulder Instructions       General Comments      Pertinent Vitals/ Pain       Pain Assessment: No/denies pain  Home Living                                          Prior Functioning/Environment              Frequency  Min 2X/week        Progress Toward Goals  OT Goals(current goals can now be found in  the care plan section)  Progress towards OT goals: (goals set today)  Acute Rehab OT Goals Time For Goal Achievement: 05/19/19 Potential to Achieve Goals: Good ADL Goals Pt Will Transfer to Toilet: with supervision;ambulating Pt Will Perform Toileting - Clothing Manipulation and hygiene: with supervision Pt Will Perform Tub/Shower Transfer: with supervision Additional ADL Goal #1: pt will verbalize 3 energy conservation strategies Additional ADL Goal #2: pt will perform adl at set up level  Plan      Co-evaluation                 AM-PAC OT "6 Clicks" Daily Activity     Outcome Measure   Help from another person eating meals?: None Help from another person taking care of personal grooming?: A Little Help from another person toileting, which includes using toliet, bedpan, or urinal?: A Little Help from another person bathing (including washing, rinsing, drying)?: A Little Help from another person to put on and taking off regular upper body clothing?: A Little Help from another person to put on and taking off regular lower body clothing?: A Little 6 Click Score: 19    End of Session    OT Visit Diagnosis: Unsteadiness on feet (R26.81);Muscle weakness (generalized) (M62.81)   Activity Tolerance Patient limited by fatigue   Patient Left (EOB)   Nurse Communication          Time: 1552-0802 OT Time Calculation  (min): 14 min  Charges: OT General Charges $OT Visit: 1 Visit OT Treatments $Therapeutic Activity: 8-22 mins  Lesle Chris, OTR/L Acute Rehabilitation Services [phone] [facility] pager [phone] office 05/05/2019   [place] 05/05/2019, 3:07 PM

## 2019-05-05 NOTE — Progress Notes (Signed)
Pt is in radiation treatment

## 2019-05-05 NOTE — Progress Notes (Signed)
PROGRESS NOTE    Hailey Cobb  CHY:[id number] DOB: 1932-08-10 DOA: 05/02/2019 PCP: Hulan Fess, MD    Brief Narrative:   85 y.o. female with medical history significant of diastolic congestive heart failure, severe aortic stenosis, tachybradycardia syndrome, hypertension, subdural hematoma, seizure, hypothyroidism status post thyroidectomy, vulvar cancer on radiation treatment who presented to the hospital with concerns of melena, increasing weakness and shortness of breath.  Patient reports that she had been feeling weak since starting radiation for her vulvar cancer in July.  About 3 days ago, she felt constipated and ate some prune juice and had 5 episodes of diarrhea with black tarry stool.  She again had another 4 episodes the following day.  She has noticed increased weakness and shortness of breath.  She presented to her radiation oncologist and was found to have worsening anemia of 8.5 down from 10.2 about 2 weeks ago.  She was then prompted to present to the emergency department for further evaluation.  Patient endorsed also feeling lightheadedness and dizziness.    ED Course: She was afebrile and normotensive on room air.  CBC showed leukopenia of 3.2.  Hemoglobin of 8.5 down from 10.9 two weeks ago.  CMP shows mildly low sodium of 131 and otherwise unremarkable.  Fecal occult blood positive.  Chest x-ray was unremarkable.   Assessment & Plan:  Principal Problem:   Acute GI bleeding Active Problems:   Diabetes mellitus ([place])   Hypothyroidism   Essential hypertension   Aortic stenosis   Chronic diastolic CHF (congestive heart failure) (HCC)   Primary vulvar squamous cell carcinoma (HCC)  GI bleed/melena with acute blood loss anemia in a patient with underlying anemia of chronic disease.   Hemoglobin of 8.6 today, transfuse for trending down hemoglobin especially if it is less than 8 in the context of severe valvular heart disease..  Patient does have history of severe aortic  stenosis and history of complications with anesthesia's.  GI has seen the patient and is recommending upper GI series due to risk of endoscopic procedure with anesthesia and previous history of cardiac dysrhythmia on anesthesia.  Continue on IV Protonix.  She underwent upper GI series this morning.  Results pending.  Will advance her diet.  Essential hypertension -Continue amlodipine, Lasix, irbesartan and doxazosin.  Blood pressure is stable at this time.  Will closely monitor.  Chronic diastolic heart failure/severe aortic stenosis. Resumed home medications.  hyperlipidemia -Continue Crestor   Hypothyroidism -Continue Synthroid  Diabetes mellitus type II.  Continue to hold oral hypoglycemic events.  Continue sliding scale insulin Accu-Cheks.  Currently on clears.  Will closely monitor blood glucose levels.  History of seizure disorder -Continue Keppra  Vulvar cancer  -continue radiation therapy.  Nonsurgical.  Debility, physical deconditioning - home health PT and home health aide at time of discharge.   DVT prophylaxis: SCDs  Code Status: Full code  Family Communication: I spoke with the patient's daughter on the phone and updated her about the clinical condition of the patient yesterday.  Disposition Plan: Continue IV Protonix.  Transfuse as necessary.  Follow GI recommendation.  Follow upper GI series.  Await upper GI series.  Advance her diet today.  Watch closely for bleeding.  Likely disposition home tomorrow if patient clinically remains stable and/ okay with GI.  Consultants:   GI service ([facility]).  Procedures:  None  Antimicrobials:  Anti-infectives (From admission, onward)   Start     Dose/Rate Route Frequency Ordered Stop   05/03/19 1000  fluconazole (DIFLUCAN) tablet 100 mg    Note to Pharmacy: Take 2 tablets on day 1     100 mg Oral Daily 05/02/19 2215       Subjective: Patient was seen after GI study.  Patient stated that she had 1 normal bowel  movement yesterday but in the evening time she had loose black stool.  Denies any further bowel movements this morning.  Denies dizziness, chest pain, palpitation, nausea or vomiting.  Feels hungry and wants to eat.  Objective: Vitals:   05/04/19 1951 05/05/19 0543 05/05/19 1023 05/05/19 1152  BP: (!) 135/56 132/65 139/77 (!) 133/48  Pulse: 63 65  (!) 55  Resp: 18 18  18   Temp: 98.4 F (36.9 C) 98.3 F (36.8 C)  97.7 F (36.5 C)  TempSrc: Oral Oral  Oral  SpO2: 93% 98%  91%  Weight:      Height:        Intake/Output Summary (Last 24 hours) at 05/05/2019 1314 Last data filed at 05/05/2019 1000 Gross per 24 hour  Intake 2799.95 ml  Output 0 ml  Net 2799.95 ml   Filed Weights   05/02/19 1445 05/02/19 2221  Weight: 89.8 kg 84.9 kg    Examination: General:  Average built, not in obvious distress HENT: Normocephalic, pupils equally reacting to light and accommodation.  No scleral pallor or icterus noted. Oral mucosa is moist.  Chest:  Clear breath sounds.  Diminished breath sounds bilaterally. No crackles or wheezes.  CVS: S1 &S2 heard. No murmur.  Regular rate and rhythm. Abdomen: Soft, nontender, nondistended.  Bowel sounds are heard.  Liver is not palpable, no abdominal mass palpated Extremities: No cyanosis, clubbing or edema.  Peripheral pulses are palpable. Psych: Alert, awake and oriented, normal mood CNS:  No cranial nerve deficits.  Power equal in all extremities.  No sensory deficits noted.  No cerebellar signs.   Skin: Warm and dry.  No rashes noted.   Data Reviewed: I have personally reviewed following labs and imaging studies  CBC: Recent Labs  Lab 05/02/19 1209  05/02/19 1646 05/02/19 2228 05/03/19 0416 05/03/19 1618 05/04/19 0448 05/04/19 1555  WBC 3.3*   < > 3.2* 2.6* 2.1* 2.7* 2.7* 3.0*  NEUTROABS 2.7  --  2.7  --   --   --   --   --   HGB 8.2*   < > 8.5* 8.6* 8.0* 8.7* 7.9* 8.6*  HCT 25.4*  --  26.0* 26.3* 25.1* 27.1* 25.3* 27.1*  MCV 89.1  --  89.7  89.8 90.6 90.0 91.3 93.4  PLT 126*   < > 134* 127* 127* 135* 124* 148*   < > = values in this interval not displayed.   Basic Metabolic Panel: Recent Labs  Lab 05/02/19 1209 05/02/19 1646 05/03/19 0416 05/05/19 0419  NA 127* 131* 131* 136  K 4.7 4.2 3.7 3.4*  CL 95* 96* 97* 105  CO2 27 28 26 25   GLUCOSE 173* 115* 104* 115*  BUN 14 15 11  <5*  CREATININE 0.77 0.68 0.55 0.46  CALCIUM 9.4 9.4 9.1 8.9  MG  --   --   --  1.7   GFR: Estimated Creatinine Clearance: 54.2 mL/min (by C-G formula based on SCr of 0.46 mg/dL).   Liver Function Tests: Recent Labs  Lab 05/02/19 1209 05/02/19 1646 05/05/19 0419  AST 19 21 18   ALT 13 18 13   ALKPHOS 77 72 61  BILITOT 0.3 0.5 0.6  PROT 6.5 6.7 5.8*  ALBUMIN 3.3* 3.5 3.1*   Coagulation Profile: Recent Labs  Lab 05/02/19 1646  INR 1.0   CBG: Recent Labs  Lab 05/04/19 1134 05/04/19 1715 05/04/19 2206 05/05/19 0811 05/05/19 1203  GLUCAP 129* 134* 141* 137* 111*   Urine analysis:    Component Value Date/Time   COLORURINE YELLOW 04/14/2019 1118   APPEARANCEUR HAZY (A) 04/14/2019 1118   LABSPEC 1.011 04/14/2019 1118   PHURINE 6.0 04/14/2019 1118   GLUCOSEU NEGATIVE 04/14/2019 1118   [place] 04/14/2019 1118   [place] 04/14/2019 1118   [place] 04/14/2019 1118   PROTEINUR NEGATIVE 04/14/2019 1118   UROBILINOGEN 0.2 07/23/2014 1445   NITRITE NEGATIVE 04/14/2019 1118   LEUKOCYTESUR MODERATE (A) 04/14/2019 1118    Recent Results (from the past 240 hour(s))  SARS CORONAVIRUS 2 (TAT 6-24 HRS) Nasopharyngeal Nasopharyngeal Swab     Status: None   Collection Time: 05/02/19  6:32 PM   Specimen: Nasopharyngeal Swab  Result Value Ref Range Status   SARS Coronavirus 2 NEGATIVE NEGATIVE Final    Comment: (NOTE) SARS-CoV-2 target nucleic acids are NOT DETECTED. The SARS-CoV-2 RNA is generally detectable in upper and lower respiratory specimens during the acute phase of infection. Negative results do  not preclude SARS-CoV-2 infection, do not rule out co-infections with other pathogens, and should not be used as the sole basis for treatment or other patient management decisions. Negative results must be combined with clinical observations, patient history, and epidemiological information. The expected result is Negative. Fact Sheet for Patients: SugarRoll.be Fact Sheet for Healthcare Providers: https://www.woods-mathews.com/ This test is not yet approved or cleared by the [place] FDA and  has been authorized for detection and/or diagnosis of SARS-CoV-2 by FDA under an Emergency Use Authorization (EUA). This EUA will remain  in effect (meaning this test can be used) for the duration of the COVID-19 declaration under Section 56 4(b)(1) of the Act, 21 U.S.C. section 360bbb-3(b)(1), unless the authorization is terminated or revoked sooner. Performed at [facility], [place] [address] [place], [place] [address]      Radiology Studies: Dg [place] Floor Double Cm (hd Ba)  Result Date: 05/05/2019 CLINICAL DATA:  Heme-positive stool and anemia. Patient cannot undergo sedation for endoscopy. EXAM: UPPER GI SERIES WITH KUB TECHNIQUE: After obtaining a scout radiograph a routine double-contrast upper GI series was performed using thin and high density barium. FLUOROSCOPY TIME:  Fluoroscopy Time:  3 minutes 12 seconds Radiation Exposure Index (if provided by the fluoroscopic device): 40.6 mGy Number of Acquired Spot Images: 13 COMPARISON:  CT abdomen pelvis dated 04/27/2019 and esophagram dated 01/05/2019. FINDINGS: The esophagus demonstrates mild esophageal dysmotility. There is no evidence of intrinsic or extrinsic mass or polyp. No constricting or obstructing lesion is identified. There is no mucosal ulceration. Mucosal folds are normal in appearance. Mild gastroesophageal reflux is seen. The stomach has no evidence of intrinsic or extrinsic mass or polyp.  No mucosal ulceration is identified. Mucosal folds are normal in appearance. A small hiatal hernia noted. The duodenum and visualized jejunum have no evidence of intrinsic or extrinsic mass or polyp. No mucosal ulceration is identified. Mucosal folds are normal in appearance. IMPRESSION: No findings to explain heme-positive stool and anemia. Electronically Signed   By: Zerita Boers M.D.   On: 05/05/2019 10:58     Scheduled Meds: . ALPRAZolam  0.5 mg Oral QHS  . amLODipine  10 mg Oral Daily  . doxazosin  4 mg Oral Daily  . doxazosin  8 mg Oral QHS  . fluconazole  100 mg Oral Daily  . furosemide  20 mg Oral Daily  . insulin aspart  0-9 Units Subcutaneous TID WC  . irbesartan  150 mg Oral BID  . levETIRAcetam  500 mg Oral Daily  . levothyroxine  125 mcg Oral QAC breakfast  . pantoprazole (PROTONIX) IV  40 mg Intravenous Q12H  . rosuvastatin  20 mg Oral Once per day on Mon Wed Fri   Continuous Infusions: . sodium chloride 75 mL/hr at 05/04/19 1725     LOS: 3 days   Flora Lipps, MD [facility]  05/05/2019, 1:14 PM

## 2019-05-06 LAB — BASIC METABOLIC PANEL
Anion gap: 7 (ref 5–15)
BUN: 6 mg/dL — ABNORMAL LOW (ref 8–23)
CO2: 25 mmol/L (ref 22–32)
Calcium: 8.7 mg/dL — ABNORMAL LOW (ref 8.9–10.3)
Chloride: 104 mmol/L (ref 98–111)
Creatinine, Ser: 0.5 mg/dL (ref 0.44–1.00)
GFR calc Af Amer: >60 mL/min (ref >60)
GFR calc non Af Amer: >60 mL/min (ref >60)
Glucose, Bld: 122 mg/dL — ABNORMAL HIGH (ref 70–99)
Potassium: 3.8 mmol/L (ref 3.5–5.1)
Sodium: 136 mmol/L (ref 135–145)

## 2019-05-06 LAB — CBC
HCT: 25.1 % — ABNORMAL LOW (ref 36.0–46.0)
Hemoglobin: 7.7 g/dL — ABNORMAL LOW (ref 12.0–15.0)
MCH: 28.7 pg (ref 26.0–34.0)
MCHC: 30.7 g/dL (ref 30.0–36.0)
MCV: 93.7 fL (ref 80.0–100.0)
Platelets: 122 10*3/uL — ABNORMAL LOW (ref 150–400)
RBC: 2.68 MIL/uL — ABNORMAL LOW (ref 3.87–5.11)
RDW: 16.6 % — ABNORMAL HIGH (ref 11.5–15.5)
WBC: 2.6 10*3/uL — ABNORMAL LOW (ref 4.0–10.5)
nRBC: 0 % (ref 0.0–0.2)

## 2019-05-06 LAB — HEMOGLOBIN AND HEMATOCRIT, BLOOD
HCT: 28.4 % — ABNORMAL LOW (ref 36.0–46.0)
Hemoglobin: 9.1 g/dL — ABNORMAL LOW (ref 12.0–15.0)

## 2019-05-06 LAB — GLUCOSE, CAPILLARY
Glucose-Capillary: 134 mg/dL — ABNORMAL HIGH (ref 70–99)
Glucose-Capillary: 183 mg/dL — ABNORMAL HIGH (ref 70–99)
Glucose-Capillary: 115 mg/dL — ABNORMAL HIGH (ref 70–99)
Glucose-Capillary: 168 mg/dL — ABNORMAL HIGH (ref 70–99)

## 2019-05-06 NOTE — Progress Notes (Signed)
PT Cancellation Note  Patient Details Name: OREATHA FABRY MRN: [id number] DOB: 04-23-1932   Cancelled Treatment:    Reason Eval/Treat Not Completed: Fatigue/ limiting ability to participate. Patient reports feeling anxious, has peaceful music playing. Reports daughter is coming soon. Check back another time.   Claretha Cooper 05/06/2019, 2:04 PM Trainer Pager [phone] Office [phone]

## 2019-05-06 NOTE — Progress Notes (Signed)
[facility] Progress Note  Hailey Cobb 84 y.o. 18-Jun-1932  CC: Anemia, heme positive stool   Subjective: She is feeling better but complaining of ongoing weakness.  Upper GI series negative.  Denies any further bleeding episodes.  ROS : Negative for acute chest pain and shortness of breath.  Positive for fatigue and weakness   Objective: Vital signs in last 24 hours: Vitals:   05/06/19 0438 05/06/19 1520  BP: (!) 119/49 (!) 116/57  Pulse: 64 61  Resp: 20 20  Temp: 97.7 F (36.5 C) 98.2 F (36.8 C)  SpO2: 93% 94%    Physical Exam:  General.  Elderly patient.  Not in acute distress Abdomen.  Soft, nontender, nondistended, bowel sounds present.  No peritoneal signs Neuro.  Alert/oriented x3 Psych.  Mood and affect normal  Lab Results: Recent Labs    05/05/19 0419 05/06/19 0356  NA 136 136  K 3.4* 3.8  CL 105 104  CO2 25 25  GLUCOSE 115* 122*  BUN <5* 6*  CREATININE 0.46 0.50  CALCIUM 8.9 8.7*  MG 1.7  --    Recent Labs    05/05/19 0419  AST 18  ALT 13  ALKPHOS 61  BILITOT 0.6  PROT 5.8*  ALBUMIN 3.1*   Recent Labs    05/04/19 1555 05/06/19 0356  WBC 3.0* 2.6*  HGB 8.6* 7.7*  HCT 27.1* 25.1*  MCV 93.4 93.7  PLT 148* 122*   No results for input(s): LABPROT, INR in the last 72 hours.    Assessment/Plan: -Anemia.  Heme positive stool.  Upper GI series negative for any ulcer or mass lesion. -History of cardiac complication during recent anesthesia.  Severe aortic stenosis. -Pancytopenia. -History of vulvar cancer  Recommendations ------------------------ -Continue supportive care for now.  Mild drop in hemoglobin could be dilutional.  Recheck CBC in the morning -  She has pancytopenia. -CT abdomen pelvis earlier this month showed no acute changes.  PET scan in July 2020 showed no evidence of distant metastatic disease.  Upper GI series negative. -GI will follow  Otis Brace MD, [place] 05/06/2019, 3:27 PM  Contact #   [phone]

## 2019-05-07 LAB — GLUCOSE, CAPILLARY
Glucose-Capillary: 126 mg/dL — ABNORMAL HIGH (ref 70–99)
Glucose-Capillary: 170 mg/dL — ABNORMAL HIGH (ref 70–99)
Glucose-Capillary: 147 mg/dL — ABNORMAL HIGH (ref 70–99)
Glucose-Capillary: 152 mg/dL — ABNORMAL HIGH (ref 70–99)

## 2019-05-07 LAB — HEMOGLOBIN AND HEMATOCRIT, BLOOD
HCT: 25.7 % — ABNORMAL LOW (ref 36.0–46.0)
HCT: 25.6 % — ABNORMAL LOW (ref 36.0–46.0)
HCT: 32 % — ABNORMAL LOW (ref 36.0–46.0)
Hemoglobin: 8.1 g/dL — ABNORMAL LOW (ref 12.0–15.0)
Hemoglobin: 8.1 g/dL — ABNORMAL LOW (ref 12.0–15.0)
Hemoglobin: 10.3 g/dL — ABNORMAL LOW (ref 12.0–15.0)

## 2019-05-07 LAB — PREPARE RBC (CROSSMATCH)

## 2019-05-07 MED ORDER — FUROSEMIDE 10 MG/ML IJ SOLN
20.0000 mg | Freq: Once | INTRAMUSCULAR | Status: AC
  Administered 2019-05-07: 20 mg via INTRAVENOUS
  Filled 2019-05-07: qty 2

## 2019-05-07 MED ORDER — SODIUM CHLORIDE 0.9% IV SOLUTION: Freq: Once | INTRAVENOUS | Status: DC

## 2019-05-07 NOTE — Progress Notes (Signed)
[facility] Progress Note  Hailey Cobb 85 y.o. 1932-04-16  CC: Anemia, heme positive stool   Subjective: Drop in hemoglobin noted.  Currently getting blood transfusion.  Had one episode of black-colored stool yesterday but had normal appearing stool today  ROS : Negative for acute chest pain and shortness of breath.  Positive for fatigue and weakness   Objective: Vital signs in last 24 hours: Vitals:   05/07/19 1145 05/07/19 1220  BP: 137/65 135/68  Pulse: 68 66  Resp: 18 18  Temp: 98.5 F (36.9 C) 98.3 F (36.8 C)  SpO2: 96% 96%    Physical Exam:  General.  Elderly patient.  Not in acute distress Abdomen.  Soft, nontender, nondistended, bowel sounds present.  No peritoneal signs Neuro.  Alert/oriented x3 Psych.  Mood and affect normal  Lab Results: Recent Labs    05/05/19 0419 05/06/19 0356  NA 136 136  K 3.4* 3.8  CL 105 104  CO2 25 25  GLUCOSE 115* 122*  BUN <5* 6*  CREATININE 0.46 0.50  CALCIUM 8.9 8.7*  MG 1.7  --    Recent Labs    05/05/19 0419  AST 18  ALT 13  ALKPHOS 61  BILITOT 0.6  PROT 5.8*  ALBUMIN 3.1*   Recent Labs    05/04/19 1555 05/06/19 0356  05/07/19 0501 05/07/19 0755  WBC 3.0* 2.6*  --   --   --   HGB 8.6* 7.7*   < > 8.1* 8.1*  HCT 27.1* 25.1*   < > 25.7* 25.6*  MCV 93.4 93.7  --   --   --   PLT 148* 122*  --   --   --    < > = values in this interval not displayed.   No results for input(s): LABPROT, INR in the last 72 hours.    Assessment/Plan: -Anemia.  Heme positive stool.  Upper GI series negative for any ulcer or mass lesion. -History of cardiac complication during recent anesthesia.  Severe aortic stenosis. -Pancytopenia. -History of vulvar cancer  Recommendations ------------------------ -Check barium enema.  -CT abdomen pelvis earlier this month showed no acute changes.  PET scan in July 2020 showed no evidence of distant metastatic disease.  Upper GI series negative.  -GI will  follow  Otis Brace MD, [place] 05/07/2019, 2:15 PM  Contact #  [phone]

## 2019-05-07 NOTE — Progress Notes (Signed)
Pt up to BSC medium BM color very light with streaks of white no blood noted

## 2019-05-07 NOTE — Progress Notes (Signed)
OT Cancellation Note  Patient Details Name: SHEROL SABAS MRN: [id number] DOB: 1932/06/09   Cancelled Treatment:    Reason Eval/Treat Not Completed: Medical issues which prohibited therapy;Other (comment). Pt with noted drop in Hgb to 8.1, currently receiving blood transfusion. Plan to reattempt tomorrow.   Tyrone Schimke, OT Acute Rehabilitation Services Pager: [phone] Office: [phone]  05/07/2019, 3:19 PM

## 2019-05-07 NOTE — Progress Notes (Signed)
PROGRESS NOTE    Hailey Cobb  WUJ:[id number]  DOB: [birth year]  DOA: 05/02/2019 PCP: Hulan Fess, MD  Brief Narrative:  85 y.o.femalewith medical history significant ofdiastolic congestive heart failure, severe aortic stenosis, tachybradycardia syndrome, hypertension, subdural hematoma, seizure, hypothyroidism status post thyroidectomy, vulvar cancer on radiation treatment who presented to the hospital with concerns of melena, increasing weakness, dizziness and shortness of breath. Patient reports that she had been feeling weak since starting radiation for her vulvar cancer in July. About 3 days PTA,  she had 5 episodes of diarrhea with black tarry stool after she took prune juice for constipation. She again hadanother 4 episodes the following day.She presented to her radiation oncologist and was found to have Hgb 8.5, down from 10.2 about 2 weeks ago. She was then referred to the ED. Labs in ED confirmed anemia, WBC count was 3.2, CMP showed sodium of 131 and otherwise labs were unremarkable.Fecal occult blood positive. Chest x-ray was unremarkable.  Subjective:  Patient on bedside commode when seen in rounds.  Had normal BM with no blood.  Feels somewhat better today.  Not in any respiratory distress, saturating well on room air.  Objective: Vitals:   05/07/19 0941 05/07/19 1145 05/07/19 1220 05/07/19 1443  BP: 125/62 137/65 135/68 136/62  Pulse:  68 66 68  Resp:  18 18 18   Temp:  98.5 F (36.9 C) 98.3 F (36.8 C) 98.6 F (37 C)  TempSrc:  Oral Oral Oral  SpO2:  96% 96% 96%  Weight:      Height:        Intake/Output Summary (Last 24 hours) at 05/07/2019 1706 Last data filed at 05/07/2019 1443 Gross per 24 hour  Intake 1647.92 ml  Output -  Net 1647.92 ml   Filed Weights   05/02/19 1445 05/02/19 2221  Weight: 89.8 kg 84.9 kg    Physical Examination:  General exam: Appears calm and in no acute distress Respiratory system: Clear to auscultation. Respiratory  effort normal. Cardiovascular system: S1 & S2 heard, loud systolic ejection murmur. No JVD, murmurs, rubs, gallops or clicks. No pedal edema. Gastrointestinal system: Abdomen is nondistended, soft and nontender. No organomegaly or masses felt. Normal bowel sounds heard. Central nervous system: Alert and oriented. No focal neurological deficits. Extremities: Symmetric but generalized weakness with strength 4/5 in all extremities Skin: No rashes, lesions or ulcers Psychiatry: Judgement and insight appear normal. Mood & affect appropriate.     Data Reviewed: I have personally reviewed following labs and imaging studies  CBC: Recent Labs  Lab 05/02/19 1209 05/02/19 1646  05/03/19 0416 05/03/19 1618 05/04/19 0448 05/04/19 1555 05/06/19 0356 05/06/19 1722 05/07/19 0501 05/07/19 0755  WBC 3.3* 3.2*   < > 2.1* 2.7* 2.7* 3.0* 2.6*  --   --   --   NEUTROABS 2.7 2.7  --   --   --   --   --   --   --   --   --   HGB 8.2* 8.5*   < > 8.0* 8.7* 7.9* 8.6* 7.7* 9.1* 8.1* 8.1*  HCT 25.4* 26.0*   < > 25.1* 27.1* 25.3* 27.1* 25.1* 28.4* 25.7* 25.6*  MCV 89.1 89.7   < > 90.6 90.0 91.3 93.4 93.7  --   --   --   PLT 126* 134*   < > 127* 135* 124* 148* 122*  --   --   --    < > = values in this interval not displayed.  Basic Metabolic Panel: Recent Labs  Lab 05/02/19 1209 05/02/19 1646 05/03/19 0416 05/05/19 0419 05/06/19 0356  NA 127* 131* 131* 136 136  K 4.7 4.2 3.7 3.4* 3.8  CL 95* 96* 97* 105 104  CO2 27 28 26 25 25   GLUCOSE 173* 115* 104* 115* 122*  BUN 14 15 11  <5* 6*  CREATININE 0.77 0.68 0.55 0.46 0.50  CALCIUM 9.4 9.4 9.1 8.9 8.7*  MG  --   --   --  1.7  --    GFR: Estimated Creatinine Clearance: 54.2 mL/min (by C-G formula based on SCr of 0.5 mg/dL). Liver Function Tests: Recent Labs  Lab 05/02/19 1209 05/02/19 1646 05/05/19 0419  AST 19 21 18   ALT 13 18 13   ALKPHOS 77 72 61  BILITOT 0.3 0.5 0.6  PROT 6.5 6.7 5.8*  ALBUMIN 3.3* 3.5 3.1*   No results for input(s):  LIPASE, AMYLASE in the last 168 hours. No results for input(s): AMMONIA in the last 168 hours. Coagulation Profile: Recent Labs  Lab 05/02/19 1646  INR 1.0   Cardiac Enzymes: No results for input(s): CKTOTAL, CKMB, CKMBINDEX, TROPONINI in the last 168 hours. BNP (last 3 results) No results for input(s): PROBNP in the last 8760 hours. HbA1C: No results for input(s): HGBA1C in the last 72 hours. CBG: Recent Labs  Lab 05/06/19 1600 05/06/19 2100 05/07/19 0736 05/07/19 1154 05/07/19 1632  GLUCAP 115* 168* 126* 170* 147*   Lipid Profile: No results for input(s): CHOL, HDL, LDLCALC, TRIG, CHOLHDL, LDLDIRECT in the last 72 hours. Thyroid Function Tests: No results for input(s): TSH, T4TOTAL, FREET4, T3FREE, THYROIDAB in the last 72 hours. Anemia Panel: No results for input(s): VITAMINB12, FOLATE, FERRITIN, TIBC, IRON, RETICCTPCT in the last 72 hours. Sepsis Labs: No results for input(s): PROCALCITON, LATICACIDVEN in the last 168 hours.  Recent Results (from the past 240 hour(s))  SARS CORONAVIRUS 2 (TAT 6-24 HRS) Nasopharyngeal Nasopharyngeal Swab     Status: None   Collection Time: 05/02/19  6:32 PM   Specimen: Nasopharyngeal Swab  Result Value Ref Range Status   SARS Coronavirus 2 NEGATIVE NEGATIVE Final    Comment: (NOTE) SARS-CoV-2 target nucleic acids are NOT DETECTED. The SARS-CoV-2 RNA is generally detectable in upper and lower respiratory specimens during the acute phase of infection. Negative results do not preclude SARS-CoV-2 infection, do not rule out co-infections with other pathogens, and should not be used as the sole basis for treatment or other patient management decisions. Negative results must be combined with clinical observations, patient history, and epidemiological information. The expected result is Negative. Fact Sheet for Patients: SugarRoll.be Fact Sheet for Healthcare Providers:  https://www.woods-mathews.com/ This test is not yet approved or cleared by the [place] FDA and  has been authorized for detection and/or diagnosis of SARS-CoV-2 by FDA under an Emergency Use Authorization (EUA). This EUA will remain  in effect (meaning this test can be used) for the duration of the COVID-19 declaration under Section 56 4(b)(1) of the Act, 21 U.S.C. section 360bbb-3(b)(1), unless the authorization is terminated or revoked sooner. Performed at [facility], [place] [address] [place], [place] [address]       Radiology Studies: No results found.      Scheduled Meds: . sodium chloride   Intravenous Once  . ALPRAZolam  0.5 mg Oral QHS  . amLODipine  10 mg Oral Daily  . doxazosin  4 mg Oral Daily  . doxazosin  8 mg Oral QHS  . fluconazole  100 mg Oral Daily  . furosemide  20 mg Oral Daily  . insulin aspart  0-9 Units Subcutaneous TID WC  . irbesartan  150 mg Oral BID  . levETIRAcetam  500 mg Oral Daily  . levothyroxine  125 mcg Oral QAC breakfast  . pantoprazole (PROTONIX) IV  40 mg Intravenous Q12H  . rosuvastatin  20 mg Oral Once per day on Mon Wed Fri   Continuous Infusions:  Assessment & Plan:   1.Exertional dyspnea: Symptomatic anemia versus symptomatic aortic stenosis.  Patient seen by GI for anemia/melena and plan as outlined below.  Patient also has severe aortic stenosis and was planned for TAVR as outpatient by Dr. Angelena Form.  Discussed with cardiology Crissie Sickles) regarding patient's presentation and fluctuating hemoglobin levels.  Cardiology recommended transfuse 1 unit followed by Lasix.  They will follow-up as outpatient.  2.UGIB : Hemoglobin been fluctuating between 7-8 during the hospital course.  She reports melanotic stool on 9/18 but had normal BM yesterday and today.  Anemia could be secondary to recurrent GI bleed versus dilutional (as WBC and platelet counts dropped as well) in the setting of IV fluids. DCed IV fluids,  repeat hemoglobin 8.1 today, transfusing in the context of severe valvular heart disease. Patient does have history of complications (arrythmia) with anesthesia due to severe aortic stenosis. GI recommended upper GI series due to high risk for endoscopic procedure with anesthesia. She underwent upper GI series on 9/18 revealing no significant findings except mild GERD.Continue on IV Protonix.  Advanced  Diet.  GI following and plans on barium enema to complete work-up  3.Chronic diastolic heart failure/severe aortic stenosis.chest x-ray on admission clear with no signs of acute CHF.  Resumed home medications including diuretic regimen.Transfusing 1 unit PRBC today followed by IV Lasix.  Will obtain walking desaturation studies prior to discharge  4. Essential hypertension:Continue amlodipine, Lasix, irbesartan and doxazosin.  Blood pressure is stable at this time.  Will closely monitor  5.Hypothyroidism:Continue Synthroid  6.Diabetes mellitus type II.  Continue to hold oral hypoglycemic events.  Continue sliding scale insulin Accu-Cheks.  Currently on clears.  Will closely monitor blood glucose levels.  7.History of seizure disorder-Continue Keppra  8.Vulvar cancer  F/U primary Oncology for-continued radiation therapy. Nonsurgical.  9. Hyperlipidemia-Continue Crestor  10.Debility, physical deconditioning- home health PT and home health aide at time of discharge.   DVT prophylaxis: SCDs in the setting of problem #1 Code Status: Full code Family / Patient Communication: Discussed with patient.  Discussed with daughter yesterday as well as this morning, all questions answered.   Disposition Plan: Home with home PT/home health aide in next 24 to 48 hours.  Requested walking desat studies on follow-up PT evaluation in a.m.     LOS: 5 days    Time spent: 35 minutes    Guilford Shi, MD [facility] Pager [phone]  If 7PM-7AM, please contact night-coverage  [email or web address] Password [email or web address] 05/07/2019, 5:06 PM

## 2019-05-08 ENCOUNTER — Ambulatory Visit
Admission: RE | Admit: 2019-05-08 | Discharge: 2019-05-08 | Disposition: A | Discharge status: Home / Self Care | Payer: Medicare Other | Source: Ambulatory Visit | Attending: Radiation Oncology | Admitting: Radiation Oncology

## 2019-05-08 LAB — CBC
HCT: 33.7 % — ABNORMAL LOW (ref 36.0–46.0)
Hemoglobin: 10.7 g/dL — ABNORMAL LOW (ref 12.0–15.0)
MCH: 28.9 pg (ref 26.0–34.0)
MCHC: 31.8 g/dL (ref 30.0–36.0)
MCV: 91.1 fL (ref 80.0–100.0)
Platelets: 131 10*3/uL — ABNORMAL LOW (ref 150–400)
RBC: 3.7 MIL/uL — ABNORMAL LOW (ref 3.87–5.11)
RDW: 16.6 % — ABNORMAL HIGH (ref 11.5–15.5)
WBC: 3.4 10*3/uL — ABNORMAL LOW (ref 4.0–10.5)
nRBC: 0 % (ref 0.0–0.2)

## 2019-05-08 LAB — GLUCOSE, CAPILLARY
Glucose-Capillary: 138 mg/dL — ABNORMAL HIGH (ref 70–99)
Glucose-Capillary: 195 mg/dL — ABNORMAL HIGH (ref 70–99)

## 2019-05-08 LAB — BPAM RBC
Blood Product Expiration Date: 202010172359
ISSUE DATE / TIME: 202009201158
Unit Type and Rh: 6200

## 2019-05-08 LAB — TYPE AND SCREEN
ABO/RH(D): A POS
Antibody Screen: NEGATIVE
Unit division: 0

## 2019-05-08 MED ORDER — ASPIRIN 81 MG PO CHEW: 81.0000 mg | CHEWABLE_TABLET | Freq: Every day | ORAL | Status: DC

## 2019-05-08 MED ORDER — MORPHINE SULFATE (PF) 2 MG/ML IV SOLN
2.0000 mg | Freq: Once | INTRAVENOUS | Status: AC
  Administered 2019-05-08: 2 mg via INTRAVENOUS
  Filled 2019-05-08: qty 1

## 2019-05-08 NOTE — Discharge Summary (Signed)
Physician Discharge Summary  Hailey Cobb TMH:[id number] DOB: [birth year] DOA: 05/02/2019  PCP: Hulan Fess, MD  Admit date: 05/02/2019 Discharge date: 05/08/2019  Admitted From: Home Disposition: Home Recommendations for Outpatient Follow-up:  1. Follow up with PCP in 1-2 weeks 2. Please obtain BMP/CBC in one week   Home Health: Yes  equipment/Devices none Discharge Condition: Stable  CODE STATUS: Full code Diet recommendation: Cardiac Brief/Interim Summary:85 y.o.femalewith medical history significant ofdiastolic congestive heart failure, severe aortic stenosis, tachybradycardia syndrome, hypertension, subdural hematoma, seizure, hypothyroidism status post thyroidectomy, vulvar cancer on radiation treatment who presented to the hospital with concerns of melena, increasing weakness, dizziness and shortness of breath. Patient reports that she had been feeling weak since starting radiation for her vulvar cancer in July. About 3 days PTA,  she had 5 episodes of diarrhea with black tarry stool after she took prune juice for constipation. She again hadanother 4 episodes the following day.She presented to her radiation oncologist and was found to have Hgb 8.5, down from 10.2 about 2 weeks ago. She was then referred to the ED. Labs in ED confirmed anemia, WBC count was 3.2, CMP showed sodium of 131 and otherwise labs were unremarkable.Fecal occult blood positive. Chest x-ray was unremarkable.    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  Patient also has severe aortic stenosis and was planned for TAVR as outpatient by Dr. Angelena Form.  Discussed with cardiology Crissie Sickles)  regarding patient's presentation and fluctuating hemoglobin levels.  Cardiology recommended transfuse 1 unit followed by Lasix.  They will follow-up as outpatient.  2.UGIB : Hemoglobin been fluctuating between 7-8 during the hospital course.  She reports melanotic stool on 9/18  Anemia could be secondary to recurrent GI bleed versus dilutional (as WBC and platelet counts dropped as well) in the setting of IV fluids.Patient does have history of complications (arrythmia) with anesthesia due to severe aortic stenosis. GI recommended upper GI series due to high risk for endoscopic procedure with anesthesia.She underwent upper GI series on 9/18 revealing no significant findings except mild GERD.patient was treated with IV Protonix.  Patient was hesitant to have a barium enema as she reported discomfort and raw area around the vagina or rectal area from radiation.  So this was canceled.   3.Chronic diastolic heart failure/severe aortic stenosis.chest x-ray on admission clear with no signs of acute CHF.    Continue Lasix as you are taking at home.   4. Essential hypertension:Continue amlodipine, Lasix, irbesartan and doxazosin.   5.Hypothyroidism:Continue Synthroid  6.Diabetes mellitus type II.Continue januvia and metformin  7.History of seizure disorder-Continue Keppra  8.Vulvar cancer  F/U primary Oncology for-continued radiation therapy. Nonsurgical.  9. Hyperlipidemia-Continue Crestor  10.Debility, physical deconditioning- home health PT and home health aide at time of discharge.    Estimated body mass index is 32.13 kg/m as calculated from the following:   Height as of this encounter: 5\' 4"  (1.626 m).   Weight as of this encounter: 84.9 kg.  Discharge Instructions  Discharge Instructions    Call MD for:  difficulty breathing, headache or visual disturbances   Complete by: As directed    Call MD for:  persistant nausea and vomiting   Complete by: As directed  Call MD  for:  temperature >100.4   Complete by: As directed    Diet - low sodium heart healthy   Complete by: As directed    Increase activity slowly   Complete by: As directed      Allergies as of 05/08/2019      Reactions   Metoprolol    Bradycardia   Clonidine Derivatives Other (See Comments)   Dizziness   Glimepiride Other (See Comments)   Hypoglycemia   Invokana [canagliflozin] Other (See Comments)   Weakness, perineal irritation      Medication List    STOP taking these medications   phenazopyridine 200 MG tablet Commonly known as: Pyridium     TAKE these medications   ALPRAZolam 0.5 MG tablet Commonly known as: XANAX Take 0.5 mg by mouth See admin instructions. Take 0.5 mg by mouth daily at bedtime, may also take 0.5 mg during the day as needed for anxiety.   amLODipine 10 MG tablet Commonly known as: NORVASC Take 10 mg by mouth daily.   aspirin 81 MG chewable tablet Chew 1 tablet (81 mg total) by mouth daily. Start 9 27 if no bleeding What changed: additional instructions   Astepro 0.15 % Soln Generic drug: Azelastine HCl Place 2 sprays into the nose at bedtime.   diphenoxylate-atropine 2.5-0.025 MG tablet Commonly known as: LOMOTIL Take 1 tablet by mouth 4 (four) times daily as needed for diarrhea or loose stools.   doxazosin 2 MG tablet Commonly known as: CARDURA Take 4-8 mg by mouth See admin instructions. Take two tablets (4 mg) by mouth each morning and four tablets (8 mg) by mouth each evening.   esomeprazole 40 MG capsule Commonly known as: NEXIUM Take 1 capsule (40 mg total) by mouth 2 (two) times daily before a meal for 30 days.   fluconazole 100 MG tablet Commonly known as: DIFLUCAN Take 1 tablet (100 mg total) by mouth daily. Take 2 tablets on day 1   furosemide 40 MG tablet Commonly known as: LASIX Take 20 mg by mouth daily.   HYDROcodone-acetaminophen 5-325 MG tablet Commonly known as: NORCO/VICODIN Take 1-2 tablets by mouth every 4 (four)  hours as needed for severe pain. Do not take and drive   irbesartan 720 MG tablet Commonly known as: AVAPRO Take 150 mg by mouth 2 (two) times daily.   levETIRAcetam 500 MG tablet Commonly known as: KEPPRA Take 500 mg by mouth daily.   levothyroxine 125 MCG tablet Commonly known as: SYNTHROID Take 1 tablet (125 mcg total) by mouth daily before breakfast for 30 days.   metFORMIN 1000 MG tablet Commonly known as: GLUCOPHAGE Take 1,000 mg by mouth 2 (two) times daily with a meal.   OVER THE COUNTER MEDICATION Take 3 tablets by mouth daily. Thymic factor vitamins   polyvinyl alcohol 1.4 % ophthalmic solution Commonly known as: LIQUIFILM TEARS Place 1 drop into both eyes every 6 (six) hours as needed for dry eyes.   rosuvastatin 20 MG tablet Commonly known as: CRESTOR Take 20 mg by mouth every Monday, Wednesday, and Friday.   sitaGLIPtin 100 MG tablet Commonly known as: JANUVIA Take 100 mg by mouth daily.      Follow-up Information    Hulan Fess, MD Follow up.   Specialty: Family Medicine Contact information: [place] [address] [phone]        Belva Crome, MD .   Specialty: Cardiology Contact information: [phone] [address] [place] [address]  [phone]          Allergies  Allergen Reactions  . Metoprolol     Bradycardia   . Clonidine Derivatives Other (See Comments)    Dizziness  . Glimepiride Other (See Comments)    Hypoglycemia  . Invokana [Canagliflozin] Other (See Comments)    Weakness, perineal irritation    Consultations: Gi   Procedures/Studies: Ct Coronary Morph W/cta Cor W/score W/ca W/cm &/or Wo/cm  Addendum Date: 04/27/2019   ADDENDUM REPORT: 04/27/2019 15:22 ADDENDUM: Please see separate dictation for contemporaneously obtained CTA chest, abdomen and pelvis dated 04/27/2019 for full description of relevant extracardiac findings. Electronically Signed   By: Vinnie Langton M.D.   On:  04/27/2019 15:22   Result Date: 04/27/2019 CLINICAL DATA:  Aortic Stenosis EXAM: Cardiac TAVR CT TECHNIQUE: The patient was scanned on a Siemens Force 989 slice scanner. A 120 kV retrospective scan was triggered in the ascending thoracic aorta at 140 HU's. Gantry rotation speed was 250 msecs and collimation was .6 mm. No beta blockade or nitro were given. The 3D data set was reconstructed in 5% intervals of the R-R cycle. Systolic and diastolic phases were analyzed on a dedicated work station using MPR, MIP and VRT modes. The patient received 80 cc of contrast. FINDINGS: Aortic Valve: Aorta: Moderate calcific atherosclerosis, no aneurysm and normal arch vessels Sino-tubular Junction: 24 mm Ascending Thoracic Aorta: 30 mm Aortic Arch: 25 mm Descending Thoracic Aorta: 22 mm Sinus of Valsalva Measurements: Non-coronary: 28.2 mm Right - coronary: 27.7 mm Left -   coronary: 29.5 mm Coronary Artery Height above Annulus: Left Main: 10.2 mm above annulus Right Coronary: 13.5 mm above annulus Virtual Basal Annulus Measurements: Maximum / Minimum Diameter: 26.2 mm x 21.2 mm Perimeter: 77 mm Area: 450 mm2 Coronary Arteries: Sufficient height above annulus for deployment Optimum Fluoroscopic Angle for Delivery: RAO 3 Caudal 18 degrees IMPRESSION: 1. Tri leaflet AV with annular area of 450 mm2 suitable for a 26 mm Sapien 3 valve. STJ on small size with extensive calcium at base of left cusp and along the left sinus 2. Optimum angiographic angle for deployment RAO 3 Caudal 18 degrees 3.  Normal aortic root 3.0 cm 4.  Coronary arteries sufficient height above annulus for deployment Jenkins Rouge Electronically Signed: By: Jenkins Rouge M.D. On: 04/27/2019 15:16   Dg Chest Portable 1 View  Result Date: 05/02/2019 CLINICAL DATA:  Weakness EXAM: PORTABLE CHEST 1 VIEW COMPARISON:  12/17/2018 FINDINGS: Cardiomegaly. No focal opacity or pleural effusion. No pneumothorax. Aortic atherosclerosis. Bilateral shoulder replacements  IMPRESSION: No active disease.  Cardiomegaly Electronically Signed   By: Donavan Foil M.D.   On: 05/02/2019 16:40   Dg Duanne Limerick W Double Cm (hd Ba)  Result Date: 05/05/2019 CLINICAL DATA:  Heme-positive stool and anemia. Patient cannot undergo sedation for endoscopy. EXAM: UPPER GI SERIES WITH KUB TECHNIQUE: After obtaining a scout radiograph a routine double-contrast upper GI series was performed using thin and high density barium. FLUOROSCOPY TIME:  Fluoroscopy Time:  3 minutes 12 seconds Radiation Exposure Index (if provided by the fluoroscopic device): 40.6 mGy Number of Acquired Spot Images: 13 COMPARISON:  CT abdomen pelvis dated 04/27/2019 and esophagram dated 01/05/2019. FINDINGS: The esophagus demonstrates mild esophageal dysmotility. There is no evidence of intrinsic or extrinsic mass or polyp. No constricting or obstructing lesion is identified. There is no mucosal ulceration. Mucosal folds are normal in appearance. Mild gastroesophageal reflux is seen. The stomach has no evidence of intrinsic or extrinsic mass or polyp.  No mucosal ulceration is identified. Mucosal folds are normal in appearance. A small hiatal hernia noted. The duodenum and visualized jejunum have no evidence of intrinsic or extrinsic mass or polyp. No mucosal ulceration is identified. Mucosal folds are normal in appearance. IMPRESSION: No findings to explain heme-positive stool and anemia. Electronically Signed   By: Zerita Boers M.D.   On: 05/05/2019 10:58   Ct Angio Chest Aorta W &/or Wo Contrast  Result Date: 04/27/2019 CLINICAL DATA:  85 year old female with history of severe aortic stenosis. Preprocedural study prior to potential transcatheter aortic valve replacement (TAVR) procedure. EXAM: CT ANGIOGRAPHY CHEST, ABDOMEN AND PELVIS TECHNIQUE: Multidetector CT imaging through the chest, abdomen and pelvis was performed using the standard protocol during bolus administration of intravenous contrast. Multiplanar reconstructed  images and MIPs were obtained and reviewed to evaluate the vascular anatomy. CONTRAST:  163mL OMNIPAQUE IOHEXOL 350 MG/ML SOLN COMPARISON:  CT the abdomen and pelvis 12/17/2018. FINDINGS: CTA CHEST FINDINGS Cardiovascular: Heart size is mildly enlarged. There is no significant pericardial fluid, thickening or pericardial calcification. There is aortic atherosclerosis, as well as atherosclerosis of the great vessels of the mediastinum and the coronary arteries, including calcified atherosclerotic plaque in the left anterior descending, left circumflex and right coronary arteries. Severe thickening calcification of the aortic valve. Calcifications of the anterior leaflet of the mitral valve and inferior mitral annulus. Mediastinum/Lymph Nodes: No pathologically enlarged mediastinal or hilar lymph nodes. Esophagus is unremarkable in appearance. No axillary lymphadenopathy. Lungs/Pleura: Tiny calcified granuloma in the periphery of the left upper lobe. No suspicious appearing pulmonary nodules or masses are noted. No acute consolidative airspace disease. No pleural effusions. Musculoskeletal/Soft Tissues: Status post bilateral shoulder arthroplasty. There are no aggressive appearing lytic or blastic lesions noted in the visualized portions of the skeleton. CTA ABDOMEN AND PELVIS FINDINGS Hepatobiliary: No suspicious cystic or solid hepatic lesions. No intra or extrahepatic biliary ductal dilatation. Gallbladder is normal in appearance. Pancreas: No pancreatic mass. No pancreatic ductal dilatation. No pancreatic or peripancreatic fluid collections or inflammatory changes. Spleen: Small splenules adjacent to the spleen, otherwise, unremarkable. Adrenals/Urinary Tract: Bilateral kidneys and adrenal glands are normal in appearance. No hydroureteronephrosis. Urinary bladder is normal in appearance. Stomach/Bowel: Normal appearance of the stomach. No pathologic dilatation of small bowel or colon. Diverticulum in the proximal  jejunum measuring 4.3 x 3.0 cm, without surrounding inflammatory changes. A few scattered colonic diverticulae are noted in the sigmoid colon, without surrounding inflammatory changes to suggest an acute diverticulitis at this time. The appendix is not confidently identified and may be surgically absent. Regardless, there are no inflammatory changes noted adjacent to the cecum to suggest the presence of an acute appendicitis at this time. Vascular/Lymphatic: Aortic atherosclerosis, without evidence of aneurysm or dissection in the abdominal or pelvic vasculature. Vascular findings and measurements pertinent to potential TAVR procedure, as detailed below. No lymphadenopathy noted in the abdomen or pelvis. Reproductive: Status post hysterectomy.  Ovaries are trophic. Other: No significant volume of ascites.  No pneumoperitoneum. Musculoskeletal: There are no aggressive appearing lytic or blastic lesions noted in the visualized portions of the skeleton. VASCULAR MEASUREMENTS PERTINENT TO TAVR: AORTA: Minimal Aortic Diameter-17 x 17 mm Severity of Aortic Calcification-mild RIGHT PELVIS: Right Common Iliac Artery - Minimal Diameter-10.3 x 9.4 mm Tortuosity-mild Calcification-none Right External Iliac Artery - Minimal Diameter-6.9 x 5.9 mm Tortuosity-moderate Calcification-none Right Common Femoral Artery - Minimal Diameter-5.9 x 6.8 mm Tortuosity-mild Calcification-mild LEFT PELVIS: Left Common Iliac Artery - Minimal Diameter-9.4 x 8.5 mm Tortuosity-mild Calcification-mild Left  External Iliac Artery - Minimal Diameter-6.3 x 5.9 mm Tortuosity-moderate Calcification-none Left Common Femoral Artery - Minimal Diameter-7.0 x 6.8 mm Tortuosity-mild Calcification-none Review of the MIP images confirms the above findings. IMPRESSION: 1. Vascular findings and measurements pertinent to potential TAVR procedure, as detailed above. 2. Severe thickening calcification of the aortic valve, compatible with the reported clinical history  of severe aortic stenosis. 3. Aortic atherosclerosis, in addition to three-vessel coronary artery disease. 4. Colonic diverticulosis, without evidence of acute diverticulitis at this time. 5. Additional incidental findings, as above. Electronically Signed   By: Vinnie Langton M.D.   On: 04/27/2019 16:43   Vas US Carotid  Result Date: 04/27/2019 Carotid Arterial Duplex Study Other Factors:     Aortic stenosis. Limitations        Today's exam was limited due to the high bifurcation of the                    carotid and the body habitus of the patient. Comparison Study:  No prior study on file for comparison Performing Technologist: Sharion Dove RVS  Examination Guidelines: A complete evaluation includes B-mode imaging, spectral Doppler, color Doppler, and power Doppler as needed of all accessible portions of each vessel. Bilateral testing is considered an integral part of a complete examination. Limited examinations for reoccurring indications may be performed as noted.  Right Carotid Findings: +----------+--------+--------+--------+------------------+------------------+           PSV cm/sEDV cm/sStenosisPlaque DescriptionComments           +----------+--------+--------+--------+------------------+------------------+ CCA Prox  58      12                                intimal thickening +----------+--------+--------+--------+------------------+------------------+ CCA Distal59      9                                 intimal thickening +----------+--------+--------+--------+------------------+------------------+ ICA Prox  60      22              heterogenous                         +----------+--------+--------+--------+------------------+------------------+ ICA Distal70      24                                                   +----------+--------+--------+--------+------------------+------------------+ ECA       47      11                                                    +----------+--------+--------+--------+------------------+------------------+ +----------+--------+-------+------------+-------------------+           PSV cm/sEDV cmsDescribe    Arm Pressure (mmHG) +----------+--------+-------+------------+-------------------+ Subclavian               Not assessed                    +----------+--------+-------+------------+-------------------+ +---------+--------+--------+----------+ VertebralPSV cm/sEDV cm/sRetrograde +---------+--------+--------+----------+  Left Carotid Findings: +----------+--------+--------+--------+------------------+------------------+  PSV cm/sEDV cm/sStenosisPlaque DescriptionComments           +----------+--------+--------+--------+------------------+------------------+ CCA Prox  71      12                                intimal thickening +----------+--------+--------+--------+------------------+------------------+ CCA Distal62      16                                intimal thickening +----------+--------+--------+--------+------------------+------------------+ ICA Prox  75      21              heterogenous                         +----------+--------+--------+--------+------------------+------------------+ ICA Distal73      25                                                   +----------+--------+--------+--------+------------------+------------------+ ECA       84      3                                                    +----------+--------+--------+--------+------------------+------------------+ +----------+--------+--------+--------+-------------------+           PSV cm/sEDV cm/sDescribeArm Pressure (mmHG) +----------+--------+--------+--------+-------------------+ Subclavian50                                          +----------+--------+--------+--------+-------------------+ +---------+--------+--+--------+-+ VertebralPSV cm/s26EDV cm/s9  +---------+--------+--+--------+-+  Summary: Right Carotid: The extracranial vessels were near-normal with only minimal wall                thickening or plaque. Left Carotid: The extracranial vessels were near-normal with only minimal wall               thickening or plaque.  *See table(s) above for measurements and observations.  Electronically signed by Monica Martinez MD on 04/27/2019 at 5:23:47 PM.    Final    Ct Angio Abdomen Pelvis  W &/or Wo Contrast  Result Date: 04/27/2019 CLINICAL DATA:  85 year old female with history of severe aortic stenosis. Preprocedural study prior to potential transcatheter aortic valve replacement (TAVR) procedure. EXAM: CT ANGIOGRAPHY CHEST, ABDOMEN AND PELVIS TECHNIQUE: Multidetector CT imaging through the chest, abdomen and pelvis was performed using the standard protocol during bolus administration of intravenous contrast. Multiplanar reconstructed images and MIPs were obtained and reviewed to evaluate the vascular anatomy. CONTRAST:  156mL OMNIPAQUE IOHEXOL 350 MG/ML SOLN COMPARISON:  CT the abdomen and pelvis 12/17/2018. FINDINGS: CTA CHEST FINDINGS Cardiovascular: Heart size is mildly enlarged. There is no significant pericardial fluid, thickening or pericardial calcification. There is aortic atherosclerosis, as well as atherosclerosis of the great vessels of the mediastinum and the coronary arteries, including calcified atherosclerotic plaque in the left anterior descending, left circumflex and right coronary arteries. Severe thickening calcification of the aortic valve. Calcifications of the anterior leaflet of the mitral valve and inferior mitral annulus. Mediastinum/Lymph Nodes: No pathologically enlarged  mediastinal or hilar lymph nodes. Esophagus is unremarkable in appearance. No axillary lymphadenopathy. Lungs/Pleura: Tiny calcified granuloma in the periphery of the left upper lobe. No suspicious appearing pulmonary nodules or masses are noted. No acute  consolidative airspace disease. No pleural effusions. Musculoskeletal/Soft Tissues: Status post bilateral shoulder arthroplasty. There are no aggressive appearing lytic or blastic lesions noted in the visualized portions of the skeleton. CTA ABDOMEN AND PELVIS FINDINGS Hepatobiliary: No suspicious cystic or solid hepatic lesions. No intra or extrahepatic biliary ductal dilatation. Gallbladder is normal in appearance. Pancreas: No pancreatic mass. No pancreatic ductal dilatation. No pancreatic or peripancreatic fluid collections or inflammatory changes. Spleen: Small splenules adjacent to the spleen, otherwise, unremarkable. Adrenals/Urinary Tract: Bilateral kidneys and adrenal glands are normal in appearance. No hydroureteronephrosis. Urinary bladder is normal in appearance. Stomach/Bowel: Normal appearance of the stomach. No pathologic dilatation of small bowel or colon. Diverticulum in the proximal jejunum measuring 4.3 x 3.0 cm, without surrounding inflammatory changes. A few scattered colonic diverticulae are noted in the sigmoid colon, without surrounding inflammatory changes to suggest an acute diverticulitis at this time. The appendix is not confidently identified and may be surgically absent. Regardless, there are no inflammatory changes noted adjacent to the cecum to suggest the presence of an acute appendicitis at this time. Vascular/Lymphatic: Aortic atherosclerosis, without evidence of aneurysm or dissection in the abdominal or pelvic vasculature. Vascular findings and measurements pertinent to potential TAVR procedure, as detailed below. No lymphadenopathy noted in the abdomen or pelvis. Reproductive: Status post hysterectomy.  Ovaries are trophic. Other: No significant volume of ascites.  No pneumoperitoneum. Musculoskeletal: There are no aggressive appearing lytic or blastic lesions noted in the visualized portions of the skeleton. VASCULAR MEASUREMENTS PERTINENT TO TAVR: AORTA: Minimal Aortic  Diameter-17 x 17 mm Severity of Aortic Calcification-mild RIGHT PELVIS: Right Common Iliac Artery - Minimal Diameter-10.3 x 9.4 mm Tortuosity-mild Calcification-none Right External Iliac Artery - Minimal Diameter-6.9 x 5.9 mm Tortuosity-moderate Calcification-none Right Common Femoral Artery - Minimal Diameter-5.9 x 6.8 mm Tortuosity-mild Calcification-mild LEFT PELVIS: Left Common Iliac Artery - Minimal Diameter-9.4 x 8.5 mm Tortuosity-mild Calcification-mild Left External Iliac Artery - Minimal Diameter-6.3 x 5.9 mm Tortuosity-moderate Calcification-none Left Common Femoral Artery - Minimal Diameter-7.0 x 6.8 mm Tortuosity-mild Calcification-none Review of the MIP images confirms the above findings. IMPRESSION: 1. Vascular findings and measurements pertinent to potential TAVR procedure, as detailed above. 2. Severe thickening calcification of the aortic valve, compatible with the reported clinical history of severe aortic stenosis. 3. Aortic atherosclerosis, in addition to three-vessel coronary artery disease. 4. Colonic diverticulosis, without evidence of acute diverticulitis at this time. 5. Additional incidental findings, as above. Electronically Signed   By: Vinnie Langton M.D.   On: 04/27/2019 16:43    (Echo, Carotid, EGD, Colonoscopy, ERCP)    Subjective:anxiuos about having barium enema    Discharge Exam: Vitals:   05/08/19 0359 05/08/19 1000  BP: 103/75 (!) 144/64  Pulse: 90 69  Resp: 20 18  Temp: 97.7 F (36.5 C) 98.1 F (36.7 C)  SpO2: 98% 97%   Vitals:   05/07/19 1443 05/07/19 2124 05/08/19 0359 05/08/19 1000  BP: 136/62 (!) 144/81 103/75 (!) 144/64  Pulse: 68 68 90 69  Resp: 18 18 20 18   Temp: 98.6 F (37 C) 97.7 F (36.5 C) 97.7 F (36.5 C) 98.1 F (36.7 C)  TempSrc: Oral Oral Oral Oral  SpO2: 96% 97% 98% 97%  Weight:      Height:        General:  Pt is alert, awake, not in acute distress Cardiovascular: RRR, S1/S2 +, no rubs, no gallops Respiratory: CTA  bilaterally, no wheezing, no rhonchi Abdominal: Soft, NT, ND, bowel sounds + Extremities: no edema, no cyanosis    The results of significant diagnostics from this hospitalization (including imaging, microbiology, ancillary and laboratory) are listed below for reference.     Microbiology: Recent Results (from the past 240 hour(s))  SARS CORONAVIRUS 2 (TAT 6-24 HRS) Nasopharyngeal Nasopharyngeal Swab     Status: None   Collection Time: 05/02/19  6:32 PM   Specimen: Nasopharyngeal Swab  Result Value Ref Range Status   SARS Coronavirus 2 NEGATIVE NEGATIVE Final    Comment: (NOTE) SARS-CoV-2 target nucleic acids are NOT DETECTED. The SARS-CoV-2 RNA is generally detectable in upper and lower respiratory specimens during the acute phase of infection. Negative results do not preclude SARS-CoV-2 infection, do not rule out co-infections with other pathogens, and should not be used as the sole basis for treatment or other patient management decisions. Negative results must be combined with clinical observations, patient history, and epidemiological information. The expected result is Negative. Fact Sheet for Patients: SugarRoll.be Fact Sheet for Healthcare Providers: https://www.woods-.com/ This test is not yet approved or cleared by the [place] FDA and  has been authorized for detection and/or diagnosis of SARS-CoV-2 by FDA under an Emergency Use Authorization (EUA). This EUA will remain  in effect (meaning this test can be used) for the duration of the COVID-19 declaration under Section 56 4(b)(1) of the Act, 21 U.S.C. section 360bbb-3(b)(1), unless the authorization is terminated or revoked sooner. Performed at [facility], [place] [address] [place], [place] [address]      Labs: BNP (last 3 results) Recent Labs    12/17/18 1948  BNP 401.0*   Basic Metabolic Panel: Recent Labs  Lab 05/02/19 1209 05/02/19 1646  05/03/19 0416 05/05/19 0419 05/06/19 0356  NA 127* 131* 131* 136 136  K 4.7 4.2 3.7 3.4* 3.8  CL 95* 96* 97* 105 104  CO2 27 28 26 25 25   GLUCOSE 173* 115* 104* 115* 122*  BUN 14 15 11  <5* 6*  CREATININE 0.77 0.68 0.55 0.46 0.50  CALCIUM 9.4 9.4 9.1 8.9 8.7*  MG  --   --   --  1.7  --    Liver Function Tests: Recent Labs  Lab 05/02/19 1209 05/02/19 1646 05/05/19 0419  AST 19 21 18   ALT 13 18 13   ALKPHOS 77 72 61  BILITOT 0.3 0.5 0.6  PROT 6.5 6.7 5.8*  ALBUMIN 3.3* 3.5 3.1*   No results for input(s): LIPASE, AMYLASE in the last 168 hours. No results for input(s): AMMONIA in the last 168 hours. CBC: Recent Labs  Lab 05/02/19 1209 05/02/19 1646  05/03/19 1618 05/04/19 0448 05/04/19 1555 05/06/19 0356 05/06/19 1722 05/07/19 0501 05/07/19 0755 05/07/19 1726 05/08/19 0856  WBC 3.3* 3.2*   < > 2.7* 2.7* 3.0* 2.6*  --   --   --   --  3.4*  NEUTROABS 2.7 2.7  --   --   --   --   --   --   --   --   --   --   HGB 8.2* 8.5*   < > 8.7* 7.9* 8.6* 7.7* 9.1* 8.1* 8.1* 10.3* 10.7*  HCT 25.4* 26.0*   < > 27.1* 25.3* 27.1* 25.1* 28.4* 25.7* 25.6* 32.0* 33.7*  MCV 89.1 89.7   < > 90.0 91.3 93.4 93.7  --   --   --   --  91.1  PLT 126* 134*   < > 135* 124* 148* 122*  --   --   --   --  131*   < > = values in this interval not displayed.   Cardiac Enzymes: No results for input(s): CKTOTAL, CKMB, CKMBINDEX, TROPONINI in the last 168 hours. BNP: Invalid input(s): POCBNP CBG: Recent Labs  Lab 05/07/19 0736 05/07/19 1154 05/07/19 1632 05/07/19 2124 05/08/19 0804  GLUCAP 126* 170* 147* 152* 138*   D-Dimer No results for input(s): DDIMER in the last 72 hours. Hgb A1c No results for input(s): HGBA1C in the last 72 hours. Lipid Profile No results for input(s): CHOL, HDL, LDLCALC, TRIG, CHOLHDL, LDLDIRECT in the last 72 hours. Thyroid function studies No results for input(s): TSH, T4TOTAL, T3FREE, THYROIDAB in the last 72 hours.  Invalid input(s): FREET3 Anemia work  up No results for input(s): VITAMINB12, FOLATE, FERRITIN, TIBC, IRON, RETICCTPCT in the last 72 hours. Urinalysis    Component Value Date/Time   COLORURINE YELLOW 04/14/2019 1118   APPEARANCEUR HAZY (A) 04/14/2019 1118   LABSPEC 1.011 04/14/2019 1118   PHURINE 6.0 04/14/2019 1118   GLUCOSEU NEGATIVE 04/14/2019 1118   [place] 04/14/2019 1118   [place] 04/14/2019 1118   [place] 04/14/2019 1118   PROTEINUR NEGATIVE 04/14/2019 1118   UROBILINOGEN 0.2 07/23/2014 1445   NITRITE NEGATIVE 04/14/2019 1118   LEUKOCYTESUR MODERATE (A) 04/14/2019 1118   Sepsis Labs Invalid input(s): PROCALCITONIN,  WBC,  LACTICIDVEN Microbiology Recent Results (from the past 240 hour(s))  SARS CORONAVIRUS 2 (TAT 6-24 HRS) Nasopharyngeal Nasopharyngeal Swab     Status: None   Collection Time: 05/02/19  6:32 PM   Specimen: Nasopharyngeal Swab  Result Value Ref Range Status   SARS Coronavirus 2 NEGATIVE NEGATIVE Final    Comment: (NOTE) SARS-CoV-2 target nucleic acids are NOT DETECTED. The SARS-CoV-2 RNA is generally detectable in upper and lower respiratory specimens during the acute phase of infection. Negative results do not preclude SARS-CoV-2 infection, do not rule out co-infections with other pathogens, and should not be used as the sole basis for treatment or other patient management decisions. Negative results must be combined with clinical observations, patient history, and epidemiological information. The expected result is Negative. Fact Sheet for Patients: SugarRoll.be Fact Sheet for Healthcare Providers: https://www.woods-.com/ This test is not yet approved or cleared by the [place] FDA and  has been authorized for detection and/or diagnosis of SARS-CoV-2 by FDA under an Emergency Use Authorization (EUA). This EUA will remain  in effect (meaning this test can be used) for the duration of the COVID-19  declaration under Section 56 4(b)(1) of the Act, 21 U.S.C. section 360bbb-3(b)(1), unless the authorization is terminated or revoked sooner. Performed at [facility], [place] [address], [place], [place] [address]      Time coordinating discharge:  37 minutes  SIGNED:   Georgette Shell, MD  [facility] 05/08/2019, 10:30 AM Pager   If 7PM-7AM, please contact night-coverage [email or web address] Password [email or web address]

## 2019-05-08 NOTE — Progress Notes (Signed)
Physical Therapy Treatment Patient Details Name: Hailey Cobb MRN: [id number] DOB: Feb 20, 1932 Today's Date: 05/08/2019    History of Present Illness 85 y.o. female with medical history significant of diastolic congestive heart failure, aortic stenosis, tachybradycardia syndrome, hypertension, subdural hematoma, seizure, hypothyroidism status post thyroidectomy, vulvar cancer on radiation treatment who presented with concerns of melena, increasing weakness and shortness of breath.     PT Comments    Progressing with mobility. Pt continues to fatigue with activity.    Follow Up Recommendations  Home health PT;Supervision - Intermittent(Home Health Aide)     Equipment Recommendations  None recommended by PT    Recommendations for Other Services       Precautions / Restrictions Precautions Precautions: Fall Restrictions Weight Bearing Restrictions: No    Mobility  Bed Mobility Overal bed mobility: Modified Independent                Transfers Overall transfer level: Needs assistance Equipment used: None Transfers: Sit to/from Stand;Stand Pivot Transfers Sit to Stand: Supervision Stand pivot transfers: Min guard       General transfer comment: stand pivot, bed to bsc.  Ambulation/Gait Ambulation/Gait assistance: Min guard Gait Distance (Feet): 150 Feet Assistive device: 4-wheeled walker Gait Pattern/deviations: Step-through pattern;Decreased stride length     General Gait Details: fair gait speed. dyspnea 3/4. SpO2: 90% on RA.   Stairs             Wheelchair Mobility    Modified Rankin (Stroke Patients Only)       Balance Overall balance assessment: Needs assistance           Standing balance-Leahy Scale: Fair                              Cognition Arousal/Alertness: Awake/alert Behavior During Therapy: WFL for tasks assessed/performed Overall Cognitive Status: Within Functional Limits for tasks assessed                                         Exercises      General Comments        Pertinent Vitals/Pain Pain Assessment: Faces Faces Pain Scale: Hurts a little bit Pain Location: genital area, R knee Pain Descriptors / Indicators: Discomfort Pain Intervention(s): Monitored during session    Home Living                      Prior Function            PT Goals (current goals can now be found in the care plan section) Progress towards PT goals: Progressing toward goals    Frequency    Min 3X/week      PT Plan Current plan remains appropriate    Co-evaluation              AM-PAC PT "6 Clicks" Mobility   Outcome Measure  Help needed turning from your back to your side while in a flat bed without using bedrails?: None Help needed moving from lying on your back to sitting on the side of a flat bed without using bedrails?: A Little Help needed moving to and from a bed to a chair (including a wheelchair)?: A Little Help needed standing up from a chair using your arms (e.g., wheelchair or bedside chair)?: A Little Help needed to walk in hospital  room?: A Little Help needed climbing 3-5 steps with a railing? : A Little 6 Click Score: 19    End of Session   Activity Tolerance: Patient tolerated treatment well Patient left: in bed;with call bell/phone within reach   PT Visit Diagnosis: Unsteadiness on feet (R26.81);Muscle weakness (generalized) (M62.81)     Time: 1222-4114 PT Time Calculation (min) (ACUTE ONLY): 17 min  Charges:  $Gait Training: 8-22 mins                        Weston Anna, PT Acute Rehabilitation Services Pager: [phone] Office: [phone]

## 2019-05-08 NOTE — TOC Transition Note (Signed)
Transition of Care [facility]) - CM/SW Discharge Note   Patient Details  Name: Hailey Cobb MRN: [id number] Date of Birth: July 08, 1932  [facility] CM/SW Contact:  Dessa Phi, RN Phone Number: 05/08/2019, 10:36 AM   Clinical Narrative: d/c home w/HHC-[facility] home 1st program-HHRN/PT/aide. Family will transport home. No further CM needs.            Patient Goals and CMS Choice        Discharge Placement                       Discharge Plan and Services                                     Social Determinants of Health (SDOH) Interventions     Readmission Risk Interventions No flowsheet data found.

## 2019-05-08 NOTE — Progress Notes (Signed)
OT Cancellation Note  Patient Details Name: JOLEENA WEISENBURGER MRN: [id number] DOB: [birth year]   Cancelled Treatment:    Reason Eval/Treat Not Completed: Patient at procedure or test/ unavailable  Pt at radiation . Will check back as schedule allows  Kari Baars, [place] [phone] Office- [phone], Thereasa Parkin 05/08/2019, 10:52 AM

## 2019-05-08 NOTE — Progress Notes (Signed)
Went over discharge papers with patient. All questions answered.  VSS.  HH needs set up by Juliann Pulse, [place].

## 2019-05-08 NOTE — Progress Notes (Signed)
[facility] Progress Note  Hailey Cobb 85 y.o. October 21, 1931  CC: Anemia, heme positive stool   Subjective: Hemoglobin improved with blood transfusion.  No further melena or blood in the stool.  Denies abdominal pain, nausea and vomiting.  Complaining of rectal pain with bowel movement.  ROS : Negative for acute chest pain and shortness of breath.  Neg  for fatigue and weakness   Objective: Vital signs in last 24 hours: Vitals:   05/07/19 2124 05/08/19 0359  BP: (!) 144/81 103/75  Pulse: 68 90  Resp: 18 20  Temp: 97.7 F (36.5 C) 97.7 F (36.5 C)  SpO2: 97% 98%    Physical Exam:  General.  Elderly patient.  Not in acute distress Abdomen.  Soft, nontender, nondistended, bowel sounds present.  No peritoneal signs Rectal Exam : Rectal exam performed with help of patient's RN.  Mild erythema around perirectal area.  Limited rectal exam because of pain.  No obvious rectal mass noted.  No blood on the finger. Neuro.  Alert/oriented x3 Psych.  Mood and affect normal  Lab Results: Recent Labs    05/06/19 0356  NA 136  K 3.8  CL 104  CO2 25  GLUCOSE 122*  BUN 6*  CREATININE 0.50  CALCIUM 8.7*   No results for input(s): AST, ALT, ALKPHOS, BILITOT, PROT, ALBUMIN in the last 72 hours. Recent Labs    05/06/19 0356  05/07/19 0755 05/07/19 1726  WBC 2.6*  --   --   --   HGB 7.7*   < > 8.1* 10.3*  HCT 25.1*   < > 25.6* 32.0*  MCV 93.7  --   --   --   PLT 122*  --   --   --    < > = values in this interval not displayed.   No results for input(s): LABPROT, INR in the last 72 hours.    Assessment/Plan: -Anemia.  Heme positive stool.  Upper GI series negative for any ulcer or mass lesion. -History of cardiac complication during recent anesthesia.  Severe aortic stenosis. -Pancytopenia. -History of vulvar cancer.  Currently getting radiation  Recommendations ------------------------ -Patient is complaining of rectal discomfort which started after taking  radiation.  Rectal exam today showed mild inflammation around perirectal area but no obvious rectal mass.  Rectal exam was limited because of pain.  -Patient does not feel comfortable proceeding with barium enema at this time.  Her hemoglobin has improved with blood transfusion.  Hold off on barium enema. D/W Dr. Rodena Piety.   -No further inpatient GI work-up planned.  Her intermittent dark stool could be from mild mucosal irritation from her radiation.  -Okay to discharge from GI standpoint.  GI will sign off.  Call us back if needed.  -CT abdomen pelvis earlier this month showed no acute changes.  PET scan in July 2020 showed no evidence of distant metastatic disease.  Upper GI series negative.    Otis Brace MD, [place] 05/08/2019, 8:40 AM  Contact #  [phone]

## 2019-05-09 ENCOUNTER — Other Ambulatory Visit: Payer: Self-pay

## 2019-05-09 ENCOUNTER — Ambulatory Visit
Admission: RE | Admit: 2019-05-09 | Discharge: 2019-05-09 | Disposition: A | Discharge status: Home / Self Care | Payer: Medicare Other | Source: Ambulatory Visit | Attending: Radiation Oncology | Admitting: Radiation Oncology

## 2019-05-09 DIAGNOSIS — I959 Hypotension, unspecified: Secondary | ICD-10-CM | POA: Diagnosis not present

## 2019-05-09 DIAGNOSIS — I350 Nonrheumatic aortic (valve) stenosis: Secondary | ICD-10-CM | POA: Diagnosis not present

## 2019-05-09 DIAGNOSIS — E119 Type 2 diabetes mellitus without complications: Secondary | ICD-10-CM | POA: Diagnosis not present

## 2019-05-09 DIAGNOSIS — I4510 Unspecified right bundle-branch block: Secondary | ICD-10-CM | POA: Diagnosis not present

## 2019-05-09 DIAGNOSIS — R7989 Other specified abnormal findings of blood chemistry: Secondary | ICD-10-CM | POA: Diagnosis not present

## 2019-05-09 DIAGNOSIS — K754 Autoimmune hepatitis: Secondary | ICD-10-CM | POA: Diagnosis not present

## 2019-05-09 DIAGNOSIS — D649 Anemia, unspecified: Secondary | ICD-10-CM | POA: Diagnosis not present

## 2019-05-09 DIAGNOSIS — Z23 Encounter for immunization: Secondary | ICD-10-CM | POA: Diagnosis not present

## 2019-05-09 DIAGNOSIS — R0602 Shortness of breath: Secondary | ICD-10-CM | POA: Diagnosis not present

## 2019-05-09 DIAGNOSIS — E7800 Pure hypercholesterolemia, unspecified: Secondary | ICD-10-CM | POA: Diagnosis not present

## 2019-05-09 DIAGNOSIS — G40909 Epilepsy, unspecified, not intractable, without status epilepticus: Secondary | ICD-10-CM | POA: Diagnosis not present

## 2019-05-09 DIAGNOSIS — Z006 Encounter for examination for normal comparison and control in clinical research program: Secondary | ICD-10-CM | POA: Diagnosis not present

## 2019-05-09 DIAGNOSIS — D696 Thrombocytopenia, unspecified: Secondary | ICD-10-CM | POA: Diagnosis not present

## 2019-05-09 DIAGNOSIS — M159 Polyosteoarthritis, unspecified: Secondary | ICD-10-CM | POA: Diagnosis not present

## 2019-05-09 DIAGNOSIS — K5753 Diverticulitis of both small and large intestine without perforation or abscess with bleeding: Secondary | ICD-10-CM | POA: Diagnosis not present

## 2019-05-09 DIAGNOSIS — K589 Irritable bowel syndrome without diarrhea: Secondary | ICD-10-CM | POA: Diagnosis not present

## 2019-05-09 DIAGNOSIS — I352 Nonrheumatic aortic (valve) stenosis with insufficiency: Secondary | ICD-10-CM | POA: Diagnosis not present

## 2019-05-09 DIAGNOSIS — I5032 Chronic diastolic (congestive) heart failure: Secondary | ICD-10-CM | POA: Diagnosis not present

## 2019-05-09 DIAGNOSIS — K449 Diaphragmatic hernia without obstruction or gangrene: Secondary | ICD-10-CM | POA: Diagnosis not present

## 2019-05-09 DIAGNOSIS — K520 Gastroenteritis and colitis due to radiation: Secondary | ICD-10-CM | POA: Diagnosis not present

## 2019-05-09 DIAGNOSIS — R011 Cardiac murmur, unspecified: Secondary | ICD-10-CM | POA: Diagnosis not present

## 2019-05-09 DIAGNOSIS — S065X0S Traumatic subdural hemorrhage without loss of consciousness, sequela: Secondary | ICD-10-CM | POA: Diagnosis not present

## 2019-05-09 DIAGNOSIS — E890 Postprocedural hypothyroidism: Secondary | ICD-10-CM | POA: Diagnosis not present

## 2019-05-09 DIAGNOSIS — K922 Gastrointestinal hemorrhage, unspecified: Secondary | ICD-10-CM | POA: Diagnosis not present

## 2019-05-09 DIAGNOSIS — I5033 Acute on chronic diastolic (congestive) heart failure: Secondary | ICD-10-CM | POA: Diagnosis not present

## 2019-05-09 DIAGNOSIS — R55 Syncope and collapse: Secondary | ICD-10-CM | POA: Diagnosis not present

## 2019-05-09 DIAGNOSIS — Z20828 Contact with and (suspected) exposure to other viral communicable diseases: Secondary | ICD-10-CM | POA: Diagnosis not present

## 2019-05-09 DIAGNOSIS — J3801 Paralysis of vocal cords and larynx, unilateral: Secondary | ICD-10-CM | POA: Diagnosis not present

## 2019-05-09 DIAGNOSIS — I495 Sick sinus syndrome: Secondary | ICD-10-CM | POA: Diagnosis not present

## 2019-05-09 DIAGNOSIS — I2510 Atherosclerotic heart disease of native coronary artery without angina pectoris: Secondary | ICD-10-CM | POA: Diagnosis not present

## 2019-05-09 DIAGNOSIS — I110 Hypertensive heart disease with heart failure: Secondary | ICD-10-CM | POA: Diagnosis not present

## 2019-05-09 DIAGNOSIS — F419 Anxiety disorder, unspecified: Secondary | ICD-10-CM | POA: Diagnosis not present

## 2019-05-09 DIAGNOSIS — E785 Hyperlipidemia, unspecified: Secondary | ICD-10-CM | POA: Diagnosis not present

## 2019-05-09 DIAGNOSIS — C519 Malignant neoplasm of vulva, unspecified: Secondary | ICD-10-CM | POA: Diagnosis not present

## 2019-05-09 DIAGNOSIS — I700 Atherosclerosis of aorta: Secondary | ICD-10-CM | POA: Diagnosis not present

## 2019-05-10 ENCOUNTER — Ambulatory Visit
Admission: RE | Admit: 2019-05-10 | Discharge: 2019-05-10 | Disposition: A | Discharge status: Home / Self Care | Payer: Medicare Other | Source: Ambulatory Visit | Attending: Radiation Oncology | Admitting: Radiation Oncology

## 2019-05-10 ENCOUNTER — Other Ambulatory Visit: Payer: Self-pay

## 2019-05-10 DIAGNOSIS — C519 Malignant neoplasm of vulva, unspecified: Secondary | ICD-10-CM | POA: Diagnosis not present

## 2019-05-10 DIAGNOSIS — K922 Gastrointestinal hemorrhage, unspecified: Secondary | ICD-10-CM

## 2019-05-10 LAB — CBC WITH DIFFERENTIAL (CANCER CENTER ONLY)
Abs Immature Granulocytes: 0.02 10*3/uL (ref 0.00–0.07)
Basophils Absolute: 0 10*3/uL (ref 0.0–0.1)
Basophils Relative: 1 %
Eosinophils Absolute: 0.1 10*3/uL (ref 0.0–0.5)
Eosinophils Relative: 3 %
HCT: 32.6 % — ABNORMAL LOW (ref 36.0–46.0)
Hemoglobin: 10.6 g/dL — ABNORMAL LOW (ref 12.0–15.0)
Immature Granulocytes: 1 %
Lymphocytes Relative: 3 %
Lymphs Abs: 0.1 10*3/uL — ABNORMAL LOW (ref 0.7–4.0)
MCH: 29.3 pg (ref 26.0–34.0)
MCHC: 32.5 g/dL (ref 30.0–36.0)
MCV: 90.1 fL (ref 80.0–100.0)
Monocytes Absolute: 0.4 10*3/uL (ref 0.1–1.0)
Monocytes Relative: 9 %
Neutro Abs: 3.4 10*3/uL (ref 1.7–7.7)
Neutrophils Relative %: 83 %
Platelet Count: 128 10*3/uL — ABNORMAL LOW (ref 150–400)
RBC: 3.62 MIL/uL — ABNORMAL LOW (ref 3.87–5.11)
RDW: 16.5 % — ABNORMAL HIGH (ref 11.5–15.5)
WBC Count: 4 10*3/uL (ref 4.0–10.5)
nRBC: 0 % (ref 0.0–0.2)

## 2019-05-10 LAB — TYPE AND SCREEN
ABO/RH(D): A POS
Antibody Screen: NEGATIVE

## 2019-05-10 LAB — ABO/RH: ABO/RH(D): A POS

## 2019-05-11 ENCOUNTER — Observation Stay (HOSPITAL_COMMUNITY): Payer: Medicare Other

## 2019-05-11 ENCOUNTER — Inpatient Hospital Stay (HOSPITAL_COMMUNITY)
Admission: EM | Admit: 2019-05-11 | Discharge: 2019-05-21 | DRG: 266 | Disposition: A | Discharge status: Home Health | Payer: Medicare Other | Attending: Cardiovascular Disease | Admitting: Cardiovascular Disease

## 2019-05-11 ENCOUNTER — Emergency Department (HOSPITAL_COMMUNITY): Payer: Medicare Other

## 2019-05-11 ENCOUNTER — Other Ambulatory Visit: Payer: Self-pay

## 2019-05-11 ENCOUNTER — Ambulatory Visit
Admission: RE | Admit: 2019-05-11 | Discharge: 2019-05-11 | Disposition: A | Discharge status: Home / Self Care | Payer: Medicare Other | Source: Ambulatory Visit | Attending: Radiation Oncology | Admitting: Radiation Oncology

## 2019-05-11 ENCOUNTER — Encounter (HOSPITAL_COMMUNITY): Payer: Self-pay | Admitting: Emergency Medicine

## 2019-05-11 DIAGNOSIS — I350 Nonrheumatic aortic (valve) stenosis: Secondary | ICD-10-CM | POA: Diagnosis not present

## 2019-05-11 DIAGNOSIS — Z79890 Hormone replacement therapy (postmenopausal): Secondary | ICD-10-CM

## 2019-05-11 DIAGNOSIS — R0902 Hypoxemia: Secondary | ICD-10-CM | POA: Diagnosis not present

## 2019-05-11 DIAGNOSIS — I471 Supraventricular tachycardia: Secondary | ICD-10-CM | POA: Diagnosis not present

## 2019-05-11 DIAGNOSIS — Z963 Presence of artificial larynx: Secondary | ICD-10-CM | POA: Diagnosis present

## 2019-05-11 DIAGNOSIS — Z888 Allergy status to other drugs, medicaments and biological substances status: Secondary | ICD-10-CM

## 2019-05-11 DIAGNOSIS — R197 Diarrhea, unspecified: Secondary | ICD-10-CM

## 2019-05-11 DIAGNOSIS — E871 Hypo-osmolality and hyponatremia: Secondary | ICD-10-CM | POA: Diagnosis present

## 2019-05-11 DIAGNOSIS — R7989 Other specified abnormal findings of blood chemistry: Secondary | ICD-10-CM | POA: Diagnosis not present

## 2019-05-11 DIAGNOSIS — Z820 Family history of epilepsy and other diseases of the nervous system: Secondary | ICD-10-CM

## 2019-05-11 DIAGNOSIS — Z79899 Other long term (current) drug therapy: Secondary | ICD-10-CM

## 2019-05-11 DIAGNOSIS — I10 Essential (primary) hypertension: Secondary | ICD-10-CM | POA: Diagnosis not present

## 2019-05-11 DIAGNOSIS — F419 Anxiety disorder, unspecified: Secondary | ICD-10-CM | POA: Diagnosis present

## 2019-05-11 DIAGNOSIS — Z23 Encounter for immunization: Secondary | ICD-10-CM

## 2019-05-11 DIAGNOSIS — I2510 Atherosclerotic heart disease of native coronary artery without angina pectoris: Secondary | ICD-10-CM | POA: Diagnosis present

## 2019-05-11 DIAGNOSIS — Z923 Personal history of irradiation: Secondary | ICD-10-CM

## 2019-05-11 DIAGNOSIS — Z823 Family history of stroke: Secondary | ICD-10-CM

## 2019-05-11 DIAGNOSIS — D630 Anemia in neoplastic disease: Secondary | ICD-10-CM | POA: Diagnosis present

## 2019-05-11 DIAGNOSIS — Z961 Presence of intraocular lens: Secondary | ICD-10-CM | POA: Diagnosis present

## 2019-05-11 DIAGNOSIS — E890 Postprocedural hypothyroidism: Secondary | ICD-10-CM | POA: Diagnosis present

## 2019-05-11 DIAGNOSIS — I110 Hypertensive heart disease with heart failure: Secondary | ICD-10-CM | POA: Diagnosis present

## 2019-05-11 DIAGNOSIS — I5033 Acute on chronic diastolic (congestive) heart failure: Secondary | ICD-10-CM | POA: Diagnosis present

## 2019-05-11 DIAGNOSIS — Z87898 Personal history of other specified conditions: Secondary | ICD-10-CM

## 2019-05-11 DIAGNOSIS — Z01818 Encounter for other preprocedural examination: Secondary | ICD-10-CM

## 2019-05-11 DIAGNOSIS — Z9841 Cataract extraction status, right eye: Secondary | ICD-10-CM

## 2019-05-11 DIAGNOSIS — L598 Other specified disorders of the skin and subcutaneous tissue related to radiation: Secondary | ICD-10-CM | POA: Diagnosis present

## 2019-05-11 DIAGNOSIS — I495 Sick sinus syndrome: Secondary | ICD-10-CM

## 2019-05-11 DIAGNOSIS — E785 Hyperlipidemia, unspecified: Secondary | ICD-10-CM | POA: Diagnosis present

## 2019-05-11 DIAGNOSIS — R58 Hemorrhage, not elsewhere classified: Secondary | ICD-10-CM | POA: Diagnosis not present

## 2019-05-11 DIAGNOSIS — R778 Other specified abnormalities of plasma proteins: Secondary | ICD-10-CM

## 2019-05-11 DIAGNOSIS — Z794 Long term (current) use of insulin: Secondary | ICD-10-CM

## 2019-05-11 DIAGNOSIS — I248 Other forms of acute ischemic heart disease: Secondary | ICD-10-CM | POA: Diagnosis present

## 2019-05-11 DIAGNOSIS — E119 Type 2 diabetes mellitus without complications: Secondary | ICD-10-CM

## 2019-05-11 DIAGNOSIS — Y842 Radiological procedure and radiotherapy as the cause of abnormal reaction of the patient, or of later complication, without mention of misadventure at the time of the procedure: Secondary | ICD-10-CM | POA: Diagnosis present

## 2019-05-11 DIAGNOSIS — G40909 Epilepsy, unspecified, not intractable, without status epilepticus: Secondary | ICD-10-CM | POA: Diagnosis present

## 2019-05-11 DIAGNOSIS — I4510 Unspecified right bundle-branch block: Secondary | ICD-10-CM | POA: Diagnosis present

## 2019-05-11 DIAGNOSIS — K520 Gastroenteritis and colitis due to radiation: Secondary | ICD-10-CM | POA: Diagnosis present

## 2019-05-11 DIAGNOSIS — I452 Bifascicular block: Secondary | ICD-10-CM | POA: Diagnosis present

## 2019-05-11 DIAGNOSIS — Z800 Family history of malignant neoplasm of digestive organs: Secondary | ICD-10-CM

## 2019-05-11 DIAGNOSIS — I272 Pulmonary hypertension, unspecified: Secondary | ICD-10-CM | POA: Diagnosis present

## 2019-05-11 DIAGNOSIS — I440 Atrioventricular block, first degree: Secondary | ICD-10-CM | POA: Diagnosis present

## 2019-05-11 DIAGNOSIS — I352 Nonrheumatic aortic (valve) stenosis with insufficiency: Secondary | ICD-10-CM | POA: Diagnosis not present

## 2019-05-11 DIAGNOSIS — K219 Gastro-esophageal reflux disease without esophagitis: Secondary | ICD-10-CM | POA: Diagnosis present

## 2019-05-11 DIAGNOSIS — R55 Syncope and collapse: Secondary | ICD-10-CM | POA: Diagnosis not present

## 2019-05-11 DIAGNOSIS — C519 Malignant neoplasm of vulva, unspecified: Secondary | ICD-10-CM | POA: Diagnosis present

## 2019-05-11 DIAGNOSIS — I959 Hypotension, unspecified: Secondary | ICD-10-CM | POA: Diagnosis not present

## 2019-05-11 DIAGNOSIS — E1159 Type 2 diabetes mellitus with other circulatory complications: Secondary | ICD-10-CM

## 2019-05-11 DIAGNOSIS — D62 Acute posthemorrhagic anemia: Secondary | ICD-10-CM | POA: Diagnosis not present

## 2019-05-11 DIAGNOSIS — Z96652 Presence of left artificial knee joint: Secondary | ICD-10-CM | POA: Diagnosis present

## 2019-05-11 DIAGNOSIS — Z20828 Contact with and (suspected) exposure to other viral communicable diseases: Secondary | ICD-10-CM | POA: Diagnosis present

## 2019-05-11 DIAGNOSIS — Z006 Encounter for examination for normal comparison and control in clinical research program: Secondary | ICD-10-CM

## 2019-05-11 DIAGNOSIS — R0602 Shortness of breath: Secondary | ICD-10-CM | POA: Diagnosis not present

## 2019-05-11 DIAGNOSIS — E039 Hypothyroidism, unspecified: Secondary | ICD-10-CM | POA: Diagnosis present

## 2019-05-11 DIAGNOSIS — Z7984 Long term (current) use of oral hypoglycemic drugs: Secondary | ICD-10-CM

## 2019-05-11 DIAGNOSIS — Z9089 Acquired absence of other organs: Secondary | ICD-10-CM

## 2019-05-11 DIAGNOSIS — Z96611 Presence of right artificial shoulder joint: Secondary | ICD-10-CM | POA: Diagnosis present

## 2019-05-11 DIAGNOSIS — Z952 Presence of prosthetic heart valve: Secondary | ICD-10-CM

## 2019-05-11 DIAGNOSIS — Z9049 Acquired absence of other specified parts of digestive tract: Secondary | ICD-10-CM

## 2019-05-11 DIAGNOSIS — R4020 Unspecified coma: Secondary | ICD-10-CM | POA: Diagnosis not present

## 2019-05-11 DIAGNOSIS — Z8679 Personal history of other diseases of the circulatory system: Secondary | ICD-10-CM

## 2019-05-11 DIAGNOSIS — D696 Thrombocytopenia, unspecified: Secondary | ICD-10-CM | POA: Diagnosis present

## 2019-05-11 DIAGNOSIS — K754 Autoimmune hepatitis: Secondary | ICD-10-CM | POA: Diagnosis present

## 2019-05-11 DIAGNOSIS — R0681 Apnea, not elsewhere classified: Secondary | ICD-10-CM | POA: Diagnosis not present

## 2019-05-11 DIAGNOSIS — E860 Dehydration: Secondary | ICD-10-CM | POA: Diagnosis present

## 2019-05-11 DIAGNOSIS — Z9842 Cataract extraction status, left eye: Secondary | ICD-10-CM

## 2019-05-11 DIAGNOSIS — Z96612 Presence of left artificial shoulder joint: Secondary | ICD-10-CM | POA: Diagnosis present

## 2019-05-11 DIAGNOSIS — Z8042 Family history of malignant neoplasm of prostate: Secondary | ICD-10-CM

## 2019-05-11 DIAGNOSIS — Z90710 Acquired absence of both cervix and uterus: Secondary | ICD-10-CM

## 2019-05-11 HISTORY — DX: Sick sinus syndrome: I49.5

## 2019-05-11 HISTORY — DX: Chronic diastolic (congestive) heart failure: I50.32

## 2019-05-11 HISTORY — DX: Nonrheumatic aortic (valve) stenosis: I35.0

## 2019-05-11 HISTORY — DX: Ventricular premature depolarization: I49.3

## 2019-05-11 HISTORY — DX: Atherosclerotic heart disease of native coronary artery without angina pectoris: I25.10

## 2019-05-11 LAB — CBC WITH DIFFERENTIAL/PLATELET
Abs Immature Granulocytes: 0.03 10*3/uL (ref 0.00–0.07)
Basophils Absolute: 0 10*3/uL (ref 0.0–0.1)
Basophils Relative: 1 %
Eosinophils Absolute: 0.1 10*3/uL (ref 0.0–0.5)
Eosinophils Relative: 3 %
HCT: 30.3 % — ABNORMAL LOW (ref 36.0–46.0)
Hemoglobin: 9.5 g/dL — ABNORMAL LOW (ref 12.0–15.0)
Immature Granulocytes: 1 %
Lymphocytes Relative: 1 %
Lymphs Abs: 0.1 10*3/uL — ABNORMAL LOW (ref 0.7–4.0)
MCH: 28.7 pg (ref 26.0–34.0)
MCHC: 31.4 g/dL (ref 30.0–36.0)
MCV: 91.5 fL (ref 80.0–100.0)
Monocytes Absolute: 0.4 10*3/uL (ref 0.1–1.0)
Monocytes Relative: 10 %
Neutro Abs: 3.2 10*3/uL (ref 1.7–7.7)
Neutrophils Relative %: 84 %
Platelets: 120 10*3/uL — ABNORMAL LOW (ref 150–400)
RBC: 3.31 MIL/uL — ABNORMAL LOW (ref 3.87–5.11)
RDW: 16.3 % — ABNORMAL HIGH (ref 11.5–15.5)
WBC: 3.8 10*3/uL — ABNORMAL LOW (ref 4.0–10.5)
nRBC: 0 % (ref 0.0–0.2)

## 2019-05-11 LAB — URINALYSIS, ROUTINE W REFLEX MICROSCOPIC
Bacteria, UA: NONE SEEN
Bilirubin Urine: NEGATIVE
Glucose, UA: NEGATIVE mg/dL
Hgb urine dipstick: NEGATIVE
Ketones, ur: NEGATIVE mg/dL
Nitrite: NEGATIVE
Protein, ur: NEGATIVE mg/dL
Specific Gravity, Urine: 1.008 (ref 1.005–1.030)
pH: 5 (ref 5.0–8.0)

## 2019-05-11 LAB — COMPREHENSIVE METABOLIC PANEL
ALT: 16 U/L (ref 0–44)
AST: 21 U/L (ref 15–41)
Albumin: 3.2 g/dL — ABNORMAL LOW (ref 3.5–5.0)
Alkaline Phosphatase: 74 U/L (ref 38–126)
Anion gap: 10 (ref 5–15)
BUN: 9 mg/dL (ref 8–23)
CO2: 26 mmol/L (ref 22–32)
Calcium: 9.1 mg/dL (ref 8.9–10.3)
Chloride: 94 mmol/L — ABNORMAL LOW (ref 98–111)
Creatinine, Ser: 0.72 mg/dL (ref 0.44–1.00)
GFR calc Af Amer: >60 mL/min (ref >60)
GFR calc non Af Amer: >60 mL/min (ref >60)
Glucose, Bld: 159 mg/dL — ABNORMAL HIGH (ref 70–99)
Potassium: 3.6 mmol/L (ref 3.5–5.1)
Sodium: 130 mmol/L — ABNORMAL LOW (ref 135–145)
Total Bilirubin: 0.3 mg/dL (ref 0.3–1.2)
Total Protein: 6.5 g/dL (ref 6.5–8.1)

## 2019-05-11 LAB — I-STAT CHEM 8, ED
BUN: 9 mg/dL (ref 8–23)
Calcium, Ion: 1.15 mmol/L (ref 1.15–1.40)
Chloride: 93 mmol/L — ABNORMAL LOW (ref 98–111)
Creatinine, Ser: 0.7 mg/dL (ref 0.44–1.00)
Glucose, Bld: 155 mg/dL — ABNORMAL HIGH (ref 70–99)
HCT: 29 % — ABNORMAL LOW (ref 36.0–46.0)
Hemoglobin: 9.9 g/dL — ABNORMAL LOW (ref 12.0–15.0)
Potassium: 3.7 mmol/L (ref 3.5–5.1)
Sodium: 131 mmol/L — ABNORMAL LOW (ref 135–145)
TCO2: 24 mmol/L (ref 22–32)

## 2019-05-11 LAB — TROPONIN I (HIGH SENSITIVITY)
Troponin I (High Sensitivity): 29 ng/L — ABNORMAL HIGH (ref <18)
Troponin I (High Sensitivity): 49 ng/L — ABNORMAL HIGH (ref <18)

## 2019-05-11 LAB — TYPE AND SCREEN
ABO/RH(D): A POS
Antibody Screen: NEGATIVE

## 2019-05-11 LAB — PROTIME-INR
INR: 1 (ref 0.8–1.2)
Prothrombin Time: 13.2 seconds (ref 11.4–15.2)

## 2019-05-11 LAB — GLUCOSE, CAPILLARY: Glucose-Capillary: 121 mg/dL — ABNORMAL HIGH (ref 70–99)

## 2019-05-11 LAB — BRAIN NATRIURETIC PEPTIDE: B Natriuretic Peptide: 145.3 pg/mL — ABNORMAL HIGH (ref 0.0–100.0)

## 2019-05-11 MED ORDER — INSULIN ASPART 100 UNIT/ML ~~LOC~~ SOLN: 0.0000 [IU] | Freq: Every day | SUBCUTANEOUS | Status: DC

## 2019-05-11 MED ORDER — ACETAMINOPHEN 325 MG PO TABS: 650.0000 mg | ORAL_TABLET | Freq: Four times a day (QID) | ORAL | Status: DC | PRN

## 2019-05-11 MED ORDER — LOPERAMIDE HCL 2 MG PO CAPS
2.0000 mg | ORAL_CAPSULE | ORAL | Status: DC | PRN
  Administered 2019-05-15: 2 mg via ORAL
  Filled 2019-05-11: qty 1

## 2019-05-11 MED ORDER — ACETAMINOPHEN 650 MG RE SUPP: 650.0000 mg | Freq: Four times a day (QID) | RECTAL | Status: DC | PRN

## 2019-05-11 MED ORDER — INSULIN ASPART 100 UNIT/ML ~~LOC~~ SOLN
0.0000 [IU] | Freq: Three times a day (TID) | SUBCUTANEOUS | Status: DC
  Administered 2019-05-12: 1 [IU] via SUBCUTANEOUS
  Administered 2019-05-13: 2 [IU] via SUBCUTANEOUS
  Administered 2019-05-13 – 2019-05-14 (×2): 1 [IU] via SUBCUTANEOUS
  Administered 2019-05-14: 3 [IU] via SUBCUTANEOUS
  Administered 2019-05-15 (×2): 1 [IU] via SUBCUTANEOUS
  Administered 2019-05-16 (×2): 2 [IU] via SUBCUTANEOUS
  Administered 2019-05-16 – 2019-05-17 (×3): 1 [IU] via SUBCUTANEOUS

## 2019-05-11 MED ORDER — ALPRAZOLAM 0.5 MG PO TABS
0.5000 mg | ORAL_TABLET | Freq: Every evening | ORAL | Status: DC | PRN
  Administered 2019-05-12 (×2): 0.5 mg via ORAL
  Filled 2019-05-11 (×2): qty 1

## 2019-05-11 MED ORDER — HYDROCODONE-ACETAMINOPHEN 5-325 MG PO TABS
1.0000 | ORAL_TABLET | Freq: Four times a day (QID) | ORAL | Status: DC | PRN
  Administered 2019-05-12 – 2019-05-17 (×12): 1 via ORAL
  Filled 2019-05-11 (×12): qty 1

## 2019-05-11 MED ORDER — HEPARIN SODIUM (PORCINE) 5000 UNIT/ML IJ SOLN: 5000.0000 [IU] | Freq: Three times a day (TID) | INTRAMUSCULAR | Status: DC
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
## 2019-05-11 NOTE — ED Notes (Signed)
Attempted report x 1 to 6E 
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
## 2019-05-11 NOTE — ED Triage Notes (Signed)
Pt arrived [facility] from home after she had a syncopal episode with period of apnea, CPR performed by husbands caregiver (no pulse check prior to initiation) initial systolic BP in 84C, given 454mL NS enroute PTA via 20G IV L wrist. Pt received radiation for vulva cancer, has frequent episodes of diarrhea, denies bloody stools, was recently admitted for GI bleed but states hgb was 10 yesterday. BP 130/40 P76 RR16 O298 CBG 164

## 2019-05-11 NOTE — ED Notes (Signed)
Attempted lab draw with IV access x 2 unsuccessful, phlebotomy notified

## 2019-05-11 NOTE — ED Notes (Signed)
Sent a urine culture with the urine specimen 

## 2019-05-12 ENCOUNTER — Ambulatory Visit: Payer: Medicare Other

## 2019-05-12 DIAGNOSIS — Z952 Presence of prosthetic heart valve: Secondary | ICD-10-CM | POA: Diagnosis not present

## 2019-05-12 DIAGNOSIS — I459 Conduction disorder, unspecified: Secondary | ICD-10-CM | POA: Diagnosis not present

## 2019-05-12 DIAGNOSIS — I495 Sick sinus syndrome: Secondary | ICD-10-CM | POA: Diagnosis not present

## 2019-05-12 DIAGNOSIS — I440 Atrioventricular block, first degree: Secondary | ICD-10-CM | POA: Diagnosis present

## 2019-05-12 DIAGNOSIS — K219 Gastro-esophageal reflux disease without esophagitis: Secondary | ICD-10-CM | POA: Diagnosis not present

## 2019-05-12 DIAGNOSIS — R55 Syncope and collapse: Secondary | ICD-10-CM | POA: Diagnosis not present

## 2019-05-12 DIAGNOSIS — K520 Gastroenteritis and colitis due to radiation: Secondary | ICD-10-CM | POA: Diagnosis not present

## 2019-05-12 DIAGNOSIS — R7989 Other specified abnormal findings of blood chemistry: Secondary | ICD-10-CM | POA: Diagnosis not present

## 2019-05-12 DIAGNOSIS — E871 Hypo-osmolality and hyponatremia: Secondary | ICD-10-CM | POA: Diagnosis not present

## 2019-05-12 DIAGNOSIS — Z006 Encounter for examination for normal comparison and control in clinical research program: Secondary | ICD-10-CM | POA: Diagnosis not present

## 2019-05-12 DIAGNOSIS — I452 Bifascicular block: Secondary | ICD-10-CM | POA: Diagnosis not present

## 2019-05-12 DIAGNOSIS — I5033 Acute on chronic diastolic (congestive) heart failure: Secondary | ICD-10-CM | POA: Diagnosis not present

## 2019-05-12 DIAGNOSIS — E890 Postprocedural hypothyroidism: Secondary | ICD-10-CM | POA: Diagnosis present

## 2019-05-12 DIAGNOSIS — E1159 Type 2 diabetes mellitus with other circulatory complications: Secondary | ICD-10-CM | POA: Diagnosis not present

## 2019-05-12 DIAGNOSIS — D62 Acute posthemorrhagic anemia: Secondary | ICD-10-CM | POA: Diagnosis not present

## 2019-05-12 DIAGNOSIS — Z01818 Encounter for other preprocedural examination: Secondary | ICD-10-CM | POA: Diagnosis not present

## 2019-05-12 DIAGNOSIS — Z20828 Contact with and (suspected) exposure to other viral communicable diseases: Secondary | ICD-10-CM | POA: Diagnosis not present

## 2019-05-12 DIAGNOSIS — E785 Hyperlipidemia, unspecified: Secondary | ICD-10-CM | POA: Diagnosis present

## 2019-05-12 DIAGNOSIS — C519 Malignant neoplasm of vulva, unspecified: Secondary | ICD-10-CM | POA: Diagnosis present

## 2019-05-12 DIAGNOSIS — R197 Diarrhea, unspecified: Secondary | ICD-10-CM | POA: Diagnosis not present

## 2019-05-12 DIAGNOSIS — D630 Anemia in neoplastic disease: Secondary | ICD-10-CM | POA: Diagnosis present

## 2019-05-12 DIAGNOSIS — Z794 Long term (current) use of insulin: Secondary | ICD-10-CM | POA: Diagnosis not present

## 2019-05-12 DIAGNOSIS — Z23 Encounter for immunization: Secondary | ICD-10-CM | POA: Diagnosis not present

## 2019-05-12 DIAGNOSIS — D696 Thrombocytopenia, unspecified: Secondary | ICD-10-CM | POA: Diagnosis present

## 2019-05-12 DIAGNOSIS — I110 Hypertensive heart disease with heart failure: Secondary | ICD-10-CM | POA: Diagnosis not present

## 2019-05-12 DIAGNOSIS — I248 Other forms of acute ischemic heart disease: Secondary | ICD-10-CM | POA: Diagnosis not present

## 2019-05-12 DIAGNOSIS — I10 Essential (primary) hypertension: Secondary | ICD-10-CM | POA: Diagnosis not present

## 2019-05-12 DIAGNOSIS — I350 Nonrheumatic aortic (valve) stenosis: Secondary | ICD-10-CM | POA: Diagnosis not present

## 2019-05-12 DIAGNOSIS — Y842 Radiological procedure and radiotherapy as the cause of abnormal reaction of the patient, or of later complication, without mention of misadventure at the time of the procedure: Secondary | ICD-10-CM | POA: Diagnosis present

## 2019-05-12 DIAGNOSIS — E119 Type 2 diabetes mellitus without complications: Secondary | ICD-10-CM | POA: Diagnosis not present

## 2019-05-12 DIAGNOSIS — I2510 Atherosclerotic heart disease of native coronary artery without angina pectoris: Secondary | ICD-10-CM | POA: Diagnosis present

## 2019-05-12 DIAGNOSIS — E039 Hypothyroidism, unspecified: Secondary | ICD-10-CM | POA: Diagnosis not present

## 2019-05-12 DIAGNOSIS — K754 Autoimmune hepatitis: Secondary | ICD-10-CM | POA: Diagnosis present

## 2019-05-12 DIAGNOSIS — R778 Other specified abnormalities of plasma proteins: Secondary | ICD-10-CM | POA: Diagnosis not present

## 2019-05-12 DIAGNOSIS — I272 Pulmonary hypertension, unspecified: Secondary | ICD-10-CM | POA: Diagnosis present

## 2019-05-12 DIAGNOSIS — I5032 Chronic diastolic (congestive) heart failure: Secondary | ICD-10-CM | POA: Diagnosis not present

## 2019-05-12 DIAGNOSIS — I471 Supraventricular tachycardia: Secondary | ICD-10-CM | POA: Diagnosis not present

## 2019-05-12 LAB — CBC
HCT: 30.3 % — ABNORMAL LOW (ref 36.0–46.0)
Hemoglobin: 9.7 g/dL — ABNORMAL LOW (ref 12.0–15.0)
MCH: 28.9 pg (ref 26.0–34.0)
MCHC: 32 g/dL (ref 30.0–36.0)
MCV: 90.2 fL (ref 80.0–100.0)
Platelets: 128 10*3/uL — ABNORMAL LOW (ref 150–400)
RBC: 3.36 MIL/uL — ABNORMAL LOW (ref 3.87–5.11)
RDW: 16.3 % — ABNORMAL HIGH (ref 11.5–15.5)
WBC: 2.9 10*3/uL — ABNORMAL LOW (ref 4.0–10.5)
nRBC: 0 % (ref 0.0–0.2)

## 2019-05-12 LAB — GLUCOSE, CAPILLARY
Glucose-Capillary: 114 mg/dL — ABNORMAL HIGH (ref 70–99)
Glucose-Capillary: 129 mg/dL — ABNORMAL HIGH (ref 70–99)
Glucose-Capillary: 115 mg/dL — ABNORMAL HIGH (ref 70–99)
Glucose-Capillary: 157 mg/dL — ABNORMAL HIGH (ref 70–99)

## 2019-05-12 LAB — TROPONIN I (HIGH SENSITIVITY): Troponin I (High Sensitivity): 53 ng/L — ABNORMAL HIGH (ref <18)

## 2019-05-12 LAB — SARS CORONAVIRUS 2 (TAT 6-24 HRS): SARS Coronavirus 2: NEGATIVE

## 2019-05-12 MED ORDER — ALPRAZOLAM 0.5 MG PO TABS
0.5000 mg | ORAL_TABLET | Freq: Four times a day (QID) | ORAL | Status: DC | PRN
  Administered 2019-05-12 – 2019-05-14 (×4): 0.5 mg via ORAL
  Filled 2019-05-12 (×4): qty 1

## 2019-05-12 MED ORDER — GERHARDT'S BUTT CREAM
1.0000 "application " | TOPICAL_CREAM | Freq: Two times a day (BID) | CUTANEOUS | Status: DC
  Administered 2019-05-12 – 2019-05-17 (×12): 1 via TOPICAL
  Filled 2019-05-12 (×2): qty 1

## 2019-05-12 MED ORDER — INFLUENZA VAC A&B SA ADJ QUAD 0.5 ML IM PRSY
0.5000 mL | PREFILLED_SYRINGE | INTRAMUSCULAR | Status: AC
  Administered 2019-05-13: 0.5 mL via INTRAMUSCULAR
  Filled 2019-05-12: qty 0.5

## 2019-05-12 NOTE — TOC Initial Note (Signed)
Transition of Care [facility]) - Initial/Assessment Note    Patient Details  Name: Hailey Cobb MRN: [id number] Date of Birth: [birth year]  Transition of [facility] CM/SW Contact:    Bethena Roys, RN Phone Number: 05/12/2019, 3:39 PM  Clinical Narrative:  Pt presented for Syncope. PTA from home and active with [facility] for [place], PT, OT. If patient transitions to inpatient status-pt will need resumption orders for [facility]. CM will continue to monitor.                  Expected Discharge Plan: [place] Barriers to Discharge: Continued Medical Work up   Expected Discharge Plan and Services Expected Discharge Plan: [place] In-house Referral: NA Discharge Planning Services: CM Consult Post Acute Care Choice: [place] arrangements for the past 2 months: Single Family Home                   Prior Living Arrangements/Services Living arrangements for the past 2 months: Single Family Home   Current home services: Home RN, Home PT, Home OT(Active with [facility])   Alcohol / Substance Use: Not Applicable Psych Involvement: No (comment)  Admission diagnosis:  Syncopal, Hypotension Patient Active Problem List   Diagnosis Date Noted  . Syncope 05/11/2019  . Diarrhea 05/11/2019  . Acute GI bleeding 05/02/2019  . 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 Elevated troponin 12/18/2018  . HLD (hyperlipidemia) 12/17/2018  . GERD (gastroesophageal reflux disease) 12/17/2018  . Seizure ([place]) 12/17/2018  . Chronic diastolic CHF (congestive heart failure) ([place]) 12/17/2018  . Hyponatremia 12/17/2018  . Abdominal pain 12/17/2018  . Ureteral calculus 08/11/2017  . 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 Symptomatic bradycardia 02/06/2017  . Dehydration 02/06/2017  . Hyperkalemia 02/06/2017  . Hypercalcemia 02/06/2017  . Right bundle branch block 07/30/2015  . Aortic stenosis 07/29/2015  .  Cellulitis of left leg 12/20/2014  . Diabetes mellitus without complication ([place]) 40/98/1191  . Cellulitis 12/20/2014  . Acute left-sided weakness   . Cerebral infarction ([place]) 07/23/2014  . Diabetes mellitus ([place]) 07/23/2014  . Anxiety 07/23/2014  . Hypothyroidism 07/23/2014  . Essential hypertension 07/23/2014  . Subdural hematoma ([place]) 07/07/2014  . Arthritis of shoulder region, right 05/16/2014   PCP:  Hulan Fess, MD Pharmacy:   [id number] - [place], [place] [address] [place] [address] Phone: [phone] Fax: [phone]  [facility] [place], [place] - [address] [place] [address] Phone: [phone] Fax: [phone]     Social Determinants of Health (SDOH) Interventions    Readmission Risk Interventions No flowsheet data found.

## 2019-05-12 NOTE — Progress Notes (Signed)
PROGRESS NOTE    Hailey Cobb  STM:[id number] DOB: November 02, 1931 DOA: 05/11/2019 PCP: Hulan Fess, MD    Brief Narrative:  85 year old lady with prior h/o chronic diastolic heart failure, DM, severe aortic valve stenosis, RBBB, tachybrady syndrome, hypertension, hyperlipidemia, hypothyroidism, subdural hematoma, vulvar cancer currently getting xrt presents to ED for syncope.   Assessment & Plan:   Principal Problem:   Syncope Active Problems:   Diabetes mellitus ([place])   Essential hypertension   Aortic stenosis   Diarrhea   Syncope:  Differential include severe critical aortic stenosis, vs dehydration from diarrhea. Vs orthostatic hypotension Pt does not appear toxic. No fever or chills.  No sob or chest pain.  CT head negative.  Echocardiogram on 02/2019 shows The left ventricle has normal systolic function with an ejection fraction of 60-65%. The cavity size was normal. There is mild concentric left ventricular hypertrophy. Left ventricular diastolic Doppler parameters are consistent with  pseudonormalization. Elevated mean left atrial pressure No evidence of left ventricular regional wall motion abnormalities. Cardiology consulted, plan for TAVR next Tuesday after discussion with her gyn oncology and radiation oncology.    Diabetes Mellitus:  CBG (last 3)  Recent Labs    05/12/19 1147 05/12/19 1615 05/12/19 2039  GLUCAP 129* 115* 157*   Resume SSI.    Hypertension;  Well controlled, continue to hold anti hypertensive's.    Diarrhea  Appears to have resolved.    Chronic diastolic heart failure:  She appears to be compensated.      DVT prophylaxis: scd's Code Status: full code.  Family Communication: (none at bedside.  Disposition Plan: pending cardiology recommendations.    Consultants:   Cardiology    Procedures: none.    Antimicrobials:none.    Subjective: Appears comfortable. No diarrhea, no nausea or vomiting.   Objective: Vitals:   05/11/19 1930 05/11/19 2306 05/12/19 0438 05/12/19 1400  BP: (!) 104/54 (!) 120/49 (!) 132/57 131/68  Pulse: 69 65 69 71  Resp: 18 20 18 18   Temp:  98.2 F (36.8 C) 97.8 F (36.6 C) 98.2 F (36.8 C)  TempSrc:  Oral Oral Oral  SpO2: 96% 97% 96% 98%  Weight:  85.7 kg 85.3 kg   Height:  5\' 4"  (1.626 m)      Intake/Output Summary (Last 24 hours) at 05/12/2019 1805 Last data filed at 05/12/2019 0949 Gross per 24 hour  Intake 560 ml  Output 200 ml  Net 360 ml   Filed Weights   05/11/19 2306 05/12/19 0438  Weight: 85.7 kg 85.3 kg    Examination:  General exam: Appears calm and comfortable  Respiratory system: Clear to auscultation. Respiratory effort normal. Cardiovascular system: S1 & S2 heard, RRR.murmer present.  Gastrointestinal system: Abdomen is nondistended, soft and nontender. No organomegaly or masses felt. Normal bowel sounds heard. Central nervous system: Alert and oriented. No focal neurological deficits. Extremities: 2+ pedal edema present. . Skin: No rashes, lesions or ulcers Psychiatry:Mood & affect appropriate.     Data Reviewed: I have personally reviewed following labs and imaging studies  CBC: Recent Labs  Lab 05/06/19 0356  05/08/19 0856 05/10/19 1128 05/11/19 1806 05/11/19 1808 05/12/19 0014  WBC 2.6*  --  3.4* 4.0 3.8*  --  2.9*  NEUTROABS  --   --   --  3.4 3.2  --   --   HGB 7.7*   < > 10.7* 10.6* 9.5* 9.9* 9.7*  HCT 25.1*   < > 33.7* 32.6* 30.3* 29.0* 30.3*  MCV 93.7  --  91.1 90.1 91.5  --  90.2  PLT 122*  --  131* 128* 120*  --  128*   < > = values in this interval not displayed.   Basic Metabolic Panel: Recent Labs  Lab 05/06/19 0356 05/11/19 1739 05/11/19 1808  NA 136 130* 131*  K 3.8 3.6 3.7  CL 104 94* 93*  CO2 25 26  --   GLUCOSE 122* 159* 155*  BUN 6* 9 9  CREATININE 0.50 0.72 0.70  CALCIUM 8.7* 9.1  --    GFR: Estimated Creatinine Clearance: 54.3 mL/min (by C-G formula based on SCr of 0.7 mg/dL). Liver Function  Tests: Recent Labs  Lab 05/11/19 1739  AST 21  ALT 16  ALKPHOS 74  BILITOT 0.3  PROT 6.5  ALBUMIN 3.2*   No results for input(s): LIPASE, AMYLASE in the last 168 hours. No results for input(s): AMMONIA in the last 168 hours. Coagulation Profile: Recent Labs  Lab 05/11/19 1806  INR 1.0   Cardiac Enzymes: No results for input(s): CKTOTAL, CKMB, CKMBINDEX, TROPONINI in the last 168 hours. BNP (last 3 results) No results for input(s): PROBNP in the last 8760 hours. HbA1C: No results for input(s): HGBA1C in the last 72 hours. CBG: Recent Labs  Lab 05/08/19 1131 05/11/19 2333 05/12/19 0732 05/12/19 1147 05/12/19 1615  GLUCAP 195* 121* 114* 129* 115*   Lipid Profile: No results for input(s): CHOL, HDL, LDLCALC, TRIG, CHOLHDL, LDLDIRECT in the last 72 hours. Thyroid Function Tests: No results for input(s): TSH, T4TOTAL, FREET4, T3FREE, THYROIDAB in the last 72 hours. Anemia Panel: No results for input(s): VITAMINB12, FOLATE, FERRITIN, TIBC, IRON, RETICCTPCT in the last 72 hours. Sepsis Labs: No results for input(s): PROCALCITON, LATICACIDVEN in the last 168 hours.  Recent Results (from the past 240 hour(s))  SARS CORONAVIRUS 2 (TAT 6-24 HRS) Nasopharyngeal Nasopharyngeal Swab     Status: None   Collection Time: 05/02/19  6:32 PM   Specimen: Nasopharyngeal Swab  Result Value Ref Range Status   SARS Coronavirus 2 NEGATIVE NEGATIVE Final    Comment: (NOTE) SARS-CoV-2 target nucleic acids are NOT DETECTED. The SARS-CoV-2 RNA is generally detectable in upper and lower respiratory specimens during the acute phase of infection. Negative results do not preclude SARS-CoV-2 infection, do not rule out co-infections with other pathogens, and should not be used as the sole basis for treatment or other patient management decisions. Negative results must be combined with clinical observations, patient history, and epidemiological information. The expected result is Negative. Fact  Sheet for Patients: SugarRoll.be Fact Sheet for Healthcare Providers: https://www.woods-mathews.com/ This test is not yet approved or cleared by the [place] FDA and  has been authorized for detection and/or diagnosis of SARS-CoV-2 by FDA under an Emergency Use Authorization (EUA). This EUA will remain  in effect (meaning this test can be used) for the duration of the COVID-19 declaration under Section 56 4(b)(1) of the Act, 21 U.S.C. section 360bbb-3(b)(1), unless the authorization is terminated or revoked sooner. Performed at [facility], [place] [address] [place], [place] [address]   SARS CORONAVIRUS 2 (TAT 6-24 HRS) Nasopharyngeal Nasopharyngeal Swab     Status: None   Collection Time: 05/11/19  7:14 PM   Specimen: Nasopharyngeal Swab  Result Value Ref Range Status   SARS Coronavirus 2 NEGATIVE NEGATIVE Final    Comment: (NOTE) SARS-CoV-2 target nucleic acids are NOT DETECTED. The SARS-CoV-2 RNA is generally detectable in upper and lower respiratory specimens during the  acute phase of infection. Negative results do not preclude SARS-CoV-2 infection, do not rule out co-infections with other pathogens, and should not be used as the sole basis for treatment or other patient management decisions. Negative results must be combined with clinical observations, patient history, and epidemiological information. The expected result is Negative. Fact Sheet for Patients: SugarRoll.be Fact Sheet for Healthcare Providers: https://www.woods-mathews.com/ This test is not yet approved or cleared by the [place] FDA and  has been authorized for detection and/or diagnosis of SARS-CoV-2 by FDA under an Emergency Use Authorization (EUA). This EUA will remain  in effect (meaning this test can be used) for the duration of the COVID-19 declaration under Section 56 4(b)(1) of the Act, 21 U.S.C. section  360bbb-3(b)(1), unless the authorization is terminated or revoked sooner. Performed at [facility], [place] [address] [place], [place] [address]          Radiology Studies: Ct Head Wo Contrast  Result Date: 05/11/2019 CLINICAL DATA:  Altered level of consciousness EXAM: CT HEAD WITHOUT CONTRAST TECHNIQUE: Contiguous axial images were obtained from the base of the skull through the vertex without intravenous contrast. COMPARISON:  07/23/2014 FINDINGS: Brain: There is atrophy and chronic small vessel disease changes. No acute intracranial abnormality. Specifically, no hemorrhage, hydrocephalus, mass lesion, acute infarction, or significant intracranial injury. Vascular: No hyperdense vessel or unexpected calcification. Skull: No acute calvarial abnormality. Sinuses/Orbits: Visualized paranasal sinuses and mastoids clear. Orbital soft tissues unremarkable. Other: None IMPRESSION: Atrophy, chronic microvascular disease. No acute intracranial abnormality. Electronically Signed   By: Rolm Baptise M.D.   On: 05/11/2019 20:50   Dg Chest Portable 1 View  Result Date: 05/11/2019 CLINICAL DATA:  Syncope, shortness of breath EXAM: PORTABLE CHEST 1 VIEW COMPARISON:  05/02/2019 FINDINGS: Heart is upper limits normal in size. No confluent airspace opacities, effusions or edema. Bilateral shoulder replacements. No acute bony abnormality. IMPRESSION: No active disease. Electronically Signed   By: Rolm Baptise M.D.   On: 05/11/2019 18:20        Scheduled Meds:  Gerhardt's butt cream  1 application Topical BID   [START ON 05/13/2019] influenza vaccine adjuvanted  0.5 mL Intramuscular Tomorrow-1000   insulin aspart  0-5 Units Subcutaneous QHS   insulin aspart  0-9 Units Subcutaneous TID WC   Continuous Infusions:   LOS: 0 days        Hosie Poisson, MD [facility] Pager [phone]  If 7PM-7AM, please contact night-coverage [email or web address] Password [facility] 05/12/2019, 6:05 PM

## 2019-05-12 NOTE — Evaluation (Signed)
Physical Therapy Evaluation Patient Details Name: Hailey Cobb MRN: [id number] DOB: 18-Jun-1932 Today's Date: 05/12/2019   History of Present Illness  85 y.o. female with medical history significant of chronic diastolic congestive heart failure, non-insulin-dependent diabetes mellitus, severe stage D aortic valve stenosis, RBBB, tachybradycardia syndrome, hypertension, hyperlipidemia, hypothyroidism, subdural hematoma, seizure, vulvar cancer on radiation presented to the hospital for evaluation of syncope. Admitted for observation 05/11/19  Clinical Impression  PTA pt had returned home from hospitalization and was just being set up with HHPT and PT. Pt on way to door to let PT in when she reached down to unstick RW and became syncopal. Pt was independent with short distance ambulation with RW. At last radiation appointment pt was told to use w/c in future due to debilitation with coming into procedure. Pt is limited by 4/4 DoE with short distance ambulation. Pt is mod I for bed mobility, supervision for transfers and hands on min guard for ambulation of 120 feet which caused 4/4 DoE, and decreased safety in order to return to seated EoB. PT recommends resumption of HHPT and increased [name]/family support to bridge her during radiation until her TAVR surgery. PT will continue to follow acutely.      Follow Up Recommendations Home health PT;Other (comment);Supervision/Assistance - 24 hour([name])    Equipment Recommendations  None recommended by PT    Recommendations for Other Services OT consult     Precautions / Restrictions Precautions Precautions: Fall Precaution Comments: syncope and fall precipitating hospitalization Restrictions Weight Bearing Restrictions: No      Mobility  Bed Mobility Overal bed mobility: Modified Independent             General bed mobility comments: increased time and effort to come to EoB  Transfers Overall transfer level: Needs  assistance Equipment used: Rolling walker (2 wheeled) Transfers: Sit to/from Stand Sit to Stand: Supervision         General transfer comment: supervision for safety, good power up and steadying, increased effort  Ambulation/Gait Ambulation/Gait assistance: Min guard Gait Distance (Feet): 120 Feet Assistive device: Rolling walker (2 wheeled)   Gait velocity: slowed Gait velocity interpretation: <1.8 ft/sec, indicate of risk for recurrent falls General Gait Details: hands on min guard for safety, no c/o dizziness, or lightheadedness, increased gait speed to get to bed in last 6 feet of ambulation due to 4/4 DoE, pt diapohoretic with increased RR, SaO2 96%O2 on RA        Balance Overall balance assessment: Needs assistance Sitting-balance support: Feet supported;No upper extremity supported Sitting balance-Leahy Scale: Good     Standing balance support: No upper extremity supported;During functional activity Standing balance-Leahy Scale: Fair                               Pertinent Vitals/Pain Pain Assessment: No/denies pain Faces Pain Scale: Hurts a little bit Pain Location: pelvic area from radiation  Pain Descriptors / Indicators: Discomfort;Grimacing Pain Intervention(s): Limited activity within patient's tolerance;Monitored during session;Repositioned    Home Living Family/patient expects to be discharged to:: Private residence Living Arrangements: Spouse/significant other;Children Available Help at Discharge: Available PRN/intermittently Type of Home: House Home Access: Ramped entrance     Home Layout: One level Home Equipment: Grab bars - tub/shower;Walker - 2 wheels;Shower seat      Prior Function Level of Independence: Needs assistance   Gait / Transfers Assistance Needed: independent with very short distance ambulation with RW,   ADL's /  Homemaking Assistance Needed: performs ADLs with difficulty, daughter assists with iADLs  Comments:  Pt's husband has dementia and his own [name] who assists with her care, pt had 1x visit from [facility] for her, pt not sure when she is coming back however reports that she was more exhausted after she left than when she came        Extremity/Trunk Assessment   Upper Extremity Assessment Upper Extremity Assessment: Overall WFL for tasks assessed    Lower Extremity Assessment Lower Extremity Assessment: RLE deficits/detail;LLE deficits/detail RLE Deficits / Details: ROM WFL, strength grossly 4-/5 RLE Sensation: WNL RLE Coordination: decreased fine motor LLE Deficits / Details: ROM WFL, strength grossly 4-/5 LLE Sensation: WNL LLE Coordination: decreased fine motor    Cervical / Trunk Assessment Cervical / Trunk Assessment: Normal  Communication   Communication: No difficulties  Cognition Arousal/Alertness: Awake/alert Behavior During Therapy: WFL for tasks assessed/performed Overall Cognitive Status: Within Functional Limits for tasks assessed                                        General Comments General comments (skin integrity, edema, etc.): BP supine 132/55, sitting 128/61 after ambulation 113/52, HR max with ambulation 112 bpm, SaO2 on RA after ambulatiion 96%O2        Assessment/Plan    PT Assessment Patient needs continued PT services  PT Problem List Decreased strength;Decreased mobility;Decreased activity tolerance;Decreased balance;Decreased knowledge of use of DME       PT Treatment Interventions DME instruction;Gait training;Therapeutic exercise;Therapeutic activities;Patient/family education;Balance training;Functional mobility training    PT Goals (Current goals can be found in the Care Plan section)  Acute Rehab PT Goals Patient Stated Goal: to get stronger and back to being independent PT Goal Formulation: With patient Time For Goal Achievement: 05/26/19 Potential to Achieve Goals: Good    Frequency Min 3X/week   Barriers to  discharge Decreased caregiver support         AM-PAC PT "6 Clicks" Mobility  Outcome Measure Help needed turning from your back to your side while in a flat bed without using bedrails?: None Help needed moving from lying on your back to sitting on the side of a flat bed without using bedrails?: None Help needed moving to and from a bed to a chair (including a wheelchair)?: None Help needed standing up from a chair using your arms (e.g., wheelchair or bedside chair)?: None Help needed to walk in hospital room?: A Little Help needed climbing 3-5 steps with a railing? : A Little 6 Click Score: 22    End of Session Equipment Utilized During Treatment: Gait belt Activity Tolerance: Patient limited by fatigue Patient left: in bed;with call bell/phone within reach;with bed alarm set;Other (comment)(sitting EoB eating breakfast) Nurse Communication: Mobility status;Other (comment) PT Visit Diagnosis: Unsteadiness on feet (R26.81);Other abnormalities of gait and mobility (R26.89);Repeated falls (R29.6);History of falling (Z91.81);Muscle weakness (generalized) (M62.81);Difficulty in walking, not elsewhere classified (R26.2)    Time: 8938-1017 PT Time Calculation (min) (ACUTE ONLY): 18 min   Charges:   PT Evaluation $PT Eval Moderate Complexity: 1 Mod          Durrell Barajas B. Migdalia Dk PT, DPT Acute Rehabilitation Services Pager [phone] Office [phone]   [place] 05/12/2019, 9:45 AM

## 2019-05-12 NOTE — Consult Note (Signed)
[facility] Nurse wound consult note Reason for Consult: Radiation effected partial thickness desquamation to perineal and buttocks area.  Has had recurring diarrhea as well.  Will implement topical Gerhardts for zinc and corticosteroid benefits should soothe and protect skin.  Wound type: Radiation breakdown skin Pressure Injury POA: NA Measurement: Scattered nonintact lesions to perineum and buttocks Wound SHF:WYOV and moist Drainage (amount, consistency, odor) serosanguinous weeping.   Periwound:erythema and tenderness Dressing procedure/placement/frequency: Cleanse skin with soap and water and pat dry.  Apply GErhardts butt paste twice daily and PRN soilage.  No disposable briefs or underpads.  Contact with dermatherapy linen will improve skin microclimate.  May place pillowcase in skin folds if excessively moist.   Will not follow at this time.  Please re-consult if needed.  Domenic Moras MSN, RN, FNP-BC [facility]ON Wound, Ostomy, Continence Nurse Pager [phone]

## 2019-05-12 NOTE — Progress Notes (Signed)
Dr. Sondra Come with Rad Onc is aware of admission and states it is fine for the patient to hold radiation treatments for one week to proceed with her cardiac procedure (possibly Tues) and the missed rad onc treatments will be added on to the end of the schedule.

## 2019-05-12 NOTE — Plan of Care (Signed)
  Problem: Clinical Measurements: Goal: Will remain free from infection Outcome: Progressing Note: No s/s of infection noted. Goal: Respiratory complications will improve Outcome: Progressing Note: No s/s of respiratory complications noted.  Stable on room air.

## 2019-05-12 NOTE — Progress Notes (Addendum)
Progress Note  Patient Name: Hailey Cobb Date of Encounter: 05/12/2019  Primary Cardiologist: Sinclair Grooms, MD   Subjective   She bend over to help her husband and then walking to with walker towards entrance and passed out.   Inpatient Medications    Scheduled Meds: . insulin aspart  0-5 Units Subcutaneous QHS  . insulin aspart  0-9 Units Subcutaneous TID WC   Continuous Infusions:  PRN Meds: acetaminophen **OR** acetaminophen, ALPRAZolam, HYDROcodone-acetaminophen, loperamide   Vital Signs    Vitals:   05/11/19 1900 05/11/19 1930 05/11/19 2306 05/12/19 0438  BP: (!) 117/54 (!) 104/54 (!) 120/49 (!) 132/57  Pulse: 72 69 65 69  Resp: (!) 24 18 20 18   Temp:   98.2 F (36.8 C) 97.8 F (36.6 C)  TempSrc:   Oral Oral  SpO2: 96% 96% 97% 96%  Weight:   85.7 kg 85.3 kg  Height:   5\' 4"  (1.626 m)     Intake/Output Summary (Last 24 hours) at 05/12/2019 0906 Last data filed at 05/12/2019 9562 Gross per 24 hour  Intake 240 ml  Output 200 ml  Net 40 ml   Last 3 Weights 05/12/2019 05/11/2019 05/02/2019  Weight (lbs) 188 lb 1.6 oz 188 lb 14.4 oz 187 lb 3.2 oz  Weight (kg) 85.322 kg 85.684 kg 84.913 kg      Telemetry     Sinus rhythm with intermittent PVCs and tachycardia- Personally Reviewed  ECG    N.A  Physical Exam   GEN: No acute distress.   Neck: No JVD Cardiac: RRR, 3/6 systolic murmurs, rubs, or gallops.  Respiratory: Clear to auscultation bilaterally. GI: Soft, nontender, non-distended  MS: No edema; No deformity. Neuro:  Nonfocal  Psych: Normal affect   Labs    High Sensitivity Troponin:   Recent Labs  Lab 05/11/19 1806 05/11/19 2112 05/12/19 0014  TROPONINIHS 29* 49* 53*      Chemistry Recent Labs  Lab 05/06/19 0356 05/11/19 1739 05/11/19 1808  NA 136 130* 131*  K 3.8 3.6 3.7  CL 104 94* 93*  CO2 25 26  --   GLUCOSE 122* 159* 155*  BUN 6* 9 9  CREATININE 0.50 0.72 0.70  CALCIUM 8.7* 9.1  --   PROT  --  6.5  --   ALBUMIN   --  3.2*  --   AST  --  21  --   ALT  --  16  --   ALKPHOS  --  74  --   BILITOT  --  0.3  --   GFRNONAA >60 >60  --   GFRAA >60 >60  --   ANIONGAP 7 10  --      Hematology Recent Labs  Lab 05/10/19 1128 05/11/19 1806 05/11/19 1808 05/12/19 0014  WBC 4.0 3.8*  --  2.9*  RBC 3.62* 3.31*  --  3.36*  HGB 10.6* 9.5* 9.9* 9.7*  HCT 32.6* 30.3* 29.0* 30.3*  MCV 90.1 91.5  --  90.2  MCH 29.3 28.7  --  28.9  MCHC 32.5 31.4  --  32.0  RDW 16.5* 16.3*  --  16.3*  PLT 128* 120*  --  128*    BNP Recent Labs  Lab 05/11/19 1727  BNP 145.3*    Radiology    Ct Head Wo Contrast  Result Date: 05/11/2019 CLINICAL DATA:  Altered level of consciousness EXAM: CT HEAD WITHOUT CONTRAST TECHNIQUE: Contiguous axial images were obtained from the base of the skull  through the vertex without intravenous contrast. COMPARISON:  07/23/2014 FINDINGS: Brain: There is atrophy and chronic small vessel disease changes. No acute intracranial abnormality. Specifically, no hemorrhage, hydrocephalus, mass lesion, acute infarction, or significant intracranial injury. Vascular: No hyperdense vessel or unexpected calcification. Skull: No acute calvarial abnormality. Sinuses/Orbits: Visualized paranasal sinuses and mastoids clear. Orbital soft tissues unremarkable. Other: None IMPRESSION: Atrophy, chronic microvascular disease. No acute intracranial abnormality. Electronically Signed   By: Rolm Baptise M.D.   On: 05/11/2019 20:50   Dg Chest Portable 1 View  Result Date: 05/11/2019 CLINICAL DATA:  Syncope, shortness of breath EXAM: PORTABLE CHEST 1 VIEW COMPARISON:  05/02/2019 FINDINGS: Heart is upper limits normal in size. No confluent airspace opacities, effusions or edema. Bilateral shoulder replacements. No acute bony abnormality. IMPRESSION: No active disease. Electronically Signed   By: Rolm Baptise M.D.   On: 05/11/2019 18:20    Cardiac Studies   04-06-19 LHC/RHC  Critical calcific aortic stenosis with  calculated aortic valve area of 0.77 cm. Peak transvalvular gradient 80 mmHg and mean gradient 66 mmHg  Normal left main  LAD contains proximal eccentric 50% stenosis. Mid LAD contains 30% narrowing.  Mid circumflex contains 30% diffuse narrowing overlapping the origin of the second marginal.  Minimal luminal irregularities are noted in the dominant right coronary.  Minimal/mild pulmonary hypertension.  Normal left ventricular end-diastolic pressure.  RECOMMENDATIONS:   Severe symptomatic aortic stenosis.  Referred to the structural heart team for consideration of TAVR.  The patient is currently undergoing curative radiation therapy for vulvar cancer.  If she is a candidate for TAVR hope to have this completed sometime within the next 3 months after she has completed radiation therapy.  TTE 02-22-19 1. The left ventricle has normal systolic function with an ejection fraction of 60-65%. The cavity size was normal. There is mild concentric left ventricular hypertrophy. Left ventricular diastolic Doppler parameters are consistent with  pseudonormalization. Elevated mean left atrial pressure No evidence of left ventricular regional wall motion abnormalities. 2. The right ventricle has normal systolic function. The cavity was normal. There is no increase in right ventricular wall thickness. Right ventricular systolic pressure is mildly elevated with an estimated pressure of 45.4 mmHg. 3. Left atrial size was mildly dilated. 4. Mitral valve regurgitation is mild to moderate by color flow Doppler. The MR jet is centrally-directed. 5. The aortic valve is tricuspid. Severely thickening of the aortic valve. Moderate calcification of the aortic valve. Aortic valve regurgitation is mild by color flow Doppler. Severe stenosis of the aortic valve. 6. When compared to the prior study: 04/29/2018, aortic stenosis has progressed and is now severe.  Patient Profile     85 y.o. female with  hx of severe/critical AS (undergoing workup for TAVR), recently admitted for GIB requiring transfusion of PRBCs, hypothyroidism, DM, seizure disorder and valvar cancer on XRT admitted after Syncope.   Assessment & Plan    1. Syncope 2. Critical AS 3. Valvar cancer on XRT 4. Seizure disorder  Difficult situation.  Initial plan was to proceed with our after XRT therapy.  However, presenting with syncope yesterday.  Her hemoglobin is stable.  Likely has syncope related to valvular disease.  She is due for radiation this morning.  I have discussed her case with structural Heart APP, She will review with Dr. Angelena Form and figure out plan.  She may require transfer to [facility] long for radiation and then bring back here for inpatient TVAR work-up.   Will follow over weekend then likely  taken over by Structural team next week.  For questions or updates, please contact [place] Please consult [email or web address] for contact info under        Signed, Leanor Kail, PA  05/12/2019, 9:06 AM    I have examined the patient and reviewed assessment and plan and discussed with patient.  Agree with above as stated.    D/w Dr. Angelena Form.  Plan for PT consult and surgical eval.  Possible TAVR next Tuesday for severe AS.    Hbg stable.  Rad onc treatment on hold for now.   Larae Grooms

## 2019-05-12 NOTE — Progress Notes (Signed)
  [place] VALVE TEAM   Patient is known to the structural heart team and undergoing TAVR work up for critical AS. Original plan was to proceed with surgery after completion of XRT for vulvar cancer. However, she was admitted last week for dyspnea and acute blood loss anemia and now she now presents with dyspnea and frank syncope. I have discussed case with multidisciplinary valve team and plan is to keep her inpatient with tentative plans for TAVR next Tuesday. I have discussed case with Dr. Denman George and Dr. Sondra Come with gyn onc and radiation oncology and they are okay with holding current XRT for planned TAVR. Any missed sessions will be added back at the end. I will see her back on Monday in full consultation with Dr. Roxy Manns.   Angelena Form PA-C  [facility]

## 2019-05-13 LAB — GLUCOSE, CAPILLARY
Glucose-Capillary: 56 mg/dL — ABNORMAL LOW (ref 70–99)
Glucose-Capillary: 195 mg/dL — ABNORMAL HIGH (ref 70–99)
Glucose-Capillary: 124 mg/dL — ABNORMAL HIGH (ref 70–99)
Glucose-Capillary: 171 mg/dL — ABNORMAL HIGH (ref 70–99)
Glucose-Capillary: 134 mg/dL — ABNORMAL HIGH (ref 70–99)

## 2019-05-13 LAB — BASIC METABOLIC PANEL
Anion gap: 7 (ref 5–15)
BUN: 6 mg/dL — ABNORMAL LOW (ref 8–23)
CO2: 26 mmol/L (ref 22–32)
Calcium: 8.9 mg/dL (ref 8.9–10.3)
Chloride: 98 mmol/L (ref 98–111)
Creatinine, Ser: 0.56 mg/dL (ref 0.44–1.00)
GFR calc Af Amer: >60 mL/min (ref >60)
GFR calc non Af Amer: >60 mL/min (ref >60)
Glucose, Bld: 112 mg/dL — ABNORMAL HIGH (ref 70–99)
Potassium: 3.6 mmol/L (ref 3.5–5.1)
Sodium: 131 mmol/L — ABNORMAL LOW (ref 135–145)

## 2019-05-13 LAB — CBC
HCT: 27.5 % — ABNORMAL LOW (ref 36.0–46.0)
Hemoglobin: 9.4 g/dL — ABNORMAL LOW (ref 12.0–15.0)
MCH: 30.1 pg (ref 26.0–34.0)
MCHC: 34.2 g/dL (ref 30.0–36.0)
MCV: 88.1 fL (ref 80.0–100.0)
Platelets: 113 10*3/uL — ABNORMAL LOW (ref 150–400)
RBC: 3.12 MIL/uL — ABNORMAL LOW (ref 3.87–5.11)
RDW: 16.4 % — ABNORMAL HIGH (ref 11.5–15.5)
WBC: 2.6 10*3/uL — ABNORMAL LOW (ref 4.0–10.5)
nRBC: 0 % (ref 0.0–0.2)

## 2019-05-13 NOTE — Progress Notes (Addendum)
Physical Therapy Treatment Patient Details Name: Hailey Cobb MRN: [id number] DOB: [birth year] Today's Date: 05/13/2019    History of Present Illness 85 y.o. female with medical history significant of chronic diastolic congestive heart failure, non-insulin-dependent diabetes mellitus, severe stage D aortic valve stenosis, RBBB, tachybradycardia syndrome, hypertension, hyperlipidemia, hypothyroidism, subdural hematoma, seizure, vulvar cancer on radiation presented to the hospital for evaluation of syncope. Admitted for observation 05/11/19    PT Comments     05/13/2019 PT TAVR Pre-Assessment  HPI: 85 y.o. female with medical history significant of chronic diastolic congestive heart failure, non-insulin-dependent diabetes mellitus, severe stage D aortic valve stenosis, RBBB, tachybradycardia syndrome, hypertension, hyperlipidemia, hypothyroidism, subdural hematoma, seizure, vulvar cancer on radiation presented to the hospital for evaluation of syncope. Admitted for observation 05/11/19  Clinical Impression Statement: Pt is a 85 y.o.female being assessed for pre-TAVR.  Pt reports symptoms of SOB with activity.  Pt has functional strength, and is generally steady with gait using a RW.  Pt ambulated 1075ft during the 6 minute walk test requiring 3 rest breaks with max HR of 95, lowest O2 sat 98, BP 146/53 during mobility.  5 meter walk test produced an average gait speed of 1.73 which indicates high fall risk, but pt noticeably faster on the 6 min walk test.  RPE was 11-14 and dyspnea was 2-3 during mobility.  Pt was limited by fatigue.  Pt's frailty rating was 5 which is considered mildly frail.  Pt would benefit from continued PT in the acute care setting due to the above listed deficits in balance, strength, endurance and activity tolerance.    General UE/LE Strength and ROM:  Strength (0-5/5) ROM (limited/full)  R UE WFL   L UE WFL   R LE WFL, generally weak   L LE WFL, generally weak      6 Minute Walk Test:   Total Distance Walked:1080 ft.    Did the pt need a rest break? Yes If yes, why? Pain:No; Fatigue:No; Dyspnea/O2 saturations: Yes Comments:    Pre-Test Post-Test  BP  146/53  HR 68 90's  O2 saturations (indicated RA or L/min [place]) 98% 95%  Modified Borg Dyspnea Scale (0 none-10 maximal) 2 3  RPE (6 very light-10 very hard) 11 14  Comments:   5 Meter Walk Test:      Trial 1 9.08 seconds  Trial 2 9.00  Trial 3 9.53  3 Trial Average/Gait Speed 9.20 seconds/1.73 ft/sec (<1.8 ft/sec indicates high fall risk)  Comments: pt appeared to be able to go at a higher speed but chose not to.  Noticeably faster cadence on 6 min test.   Clinical Frailty Scale (1 very fit - 9 terminally ill): 5 (</= 5/12 is considered frail)   @SIGNATURE @ 05/13/2019  Donnella Sham, [place] [phone]  (pager) [phone]  (office)    Follow Up Recommendations  Home health PT;Other (comment);Supervision/Assistance - 24 hour     Equipment Recommendations  None recommended by PT    Recommendations for Other Services       Precautions / Restrictions      Mobility  Bed Mobility                  Transfers                    Ambulation/Gait                 Stairs  Wheelchair Mobility    Modified Rankin (Stroke Patients Only)       Balance                                            Cognition                                              Exercises      General Comments General comments (skin integrity, edema, etc.): see TAVR assessment in progress notes      Pertinent Vitals/Pain      Home Living                      Prior Function            PT Goals (current goals can now be found in the care plan section) Acute Rehab PT Goals Patient Stated Goal: to get stronger and back to being independent PT Goal Formulation:  With patient Time For Goal Achievement: 05/26/19 Potential to Achieve Goals: Good    Frequency    Min 3X/week      PT Plan      Co-evaluation              AM-PAC PT "6 Clicks" Mobility   Outcome Measure  Help needed turning from your back to your side while in a flat bed without using bedrails?: None Help needed moving from lying on your back to sitting on the side of a flat bed without using bedrails?: None Help needed moving to and from a bed to a chair (including a wheelchair)?: None Help needed standing up from a chair using your arms (e.g., wheelchair or bedside chair)?: None Help needed to walk in hospital room?: A Little Help needed climbing 3-5 steps with a railing? : A Little 6 Click Score: 22    End of Session   Activity Tolerance: Patient limited by fatigue Patient left: in bed;with call bell/phone within reach;with bed alarm set;Other (comment) Nurse Communication: Mobility status;Other (comment) PT Visit Diagnosis: Unsteadiness on feet (R26.81);Other abnormalities of gait and mobility (R26.89);Repeated falls (R29.6);History of falling (Z91.81);Muscle weakness (generalized) (M62.81);Difficulty in walking, not elsewhere classified (R26.2)     Time: 4132-4401 PT Time Calculation (min) (ACUTE ONLY): 28 min  Charges:  $Gait Training: 8-22 mins $Therapeutic Activity: 8-22 mins                     05/13/2019  Donnella Sham, PT Acute Rehabilitation Services [phone]  (pager) [phone]  (office)   Hailey Cobb 05/13/2019, 2:41 PM

## 2019-05-13 NOTE — Progress Notes (Signed)
PROGRESS NOTE    Hailey Cobb  AUQ:[id number] DOB: 1931/10/05 DOA: 05/11/2019 PCP: Hulan Fess, MD    Brief Narrative:  85 year old lady with prior h/o chronic diastolic heart failure, DM, severe aortic valve stenosis, RBBB, tachybrady syndrome, hypertension, hyperlipidemia, hypothyroidism, subdural hematoma, vulvar cancer currently getting xrt presents to ED for syncope.   Assessment & Plan:   Principal Problem:   Syncope Active Problems:   Diabetes mellitus ([place])   Essential hypertension   Aortic stenosis   Diarrhea   Syncope:  Differential include severe critical aortic stenosis, vs dehydration from diarrhea. Vs orthostatic hypotension Pt does not appear toxic. No fever or chills.  No sob or chest pain.  CT head negative.  Echocardiogram on 02/2019 shows The left ventricle has normal systolic function with an ejection fraction of 60-65%. The cavity size was normal. There is mild concentric left ventricular hypertrophy. Left ventricular diastolic Doppler parameters are consistent with pseudonormalization. Elevated mean left atrial pressure No evidence of left ventricular regional wall motion abnormalities. Cardiology consulted, plan for TAVR next Tuesday after discussion with her gyn oncology and radiation oncology.  No dizziness, today. No new complaints.    Diabetes Mellitus:  CBG (last 3)  Recent Labs    05/13/19 0829 05/13/19 1120 05/13/19 1637  GLUCAP 195* 124* 171*   Resume SSI. No changes in medications.    Hypertension;  Well controlled.     Diarrhea  Resolved. Only 2 BM today.    Chronic diastolic heart failure:  She appears to be compensated.      DVT prophylaxis: scd's Code Status: full code.  Family Communication: none at bedside.  Disposition Plan: pending cardiology recommendations.    Consultants:   Cardiology   Cardiothoracic surgery.    Procedures: none.    Antimicrobials:none.    Subjective: In good spirits. No chest  pain or sob.   Objective: Vitals:   05/12/19 1400 05/12/19 2041 05/13/19 0542 05/13/19 0732  BP: 131/68 (!) 137/54 135/60 (!) 145/62  Pulse: 71 62 (!) 58 62  Resp: 18 18 18 19   Temp: 98.2 F (36.8 C) 97.8 F (36.6 C) 97.7 F (36.5 C)   TempSrc: Oral Oral Oral   SpO2: 98% 98% 97% 97%  Weight:   83.7 kg   Height:        Intake/Output Summary (Last 24 hours) at 05/13/2019 1938 Last data filed at 05/13/2019 0700 Gross per 24 hour  Intake -  Output 650 ml  Net -650 ml   Filed Weights   05/11/19 2306 05/12/19 0438 05/13/19 0542  Weight: 85.7 kg 85.3 kg 83.7 kg    Examination:  General exam: comfortable and not in distress.  Respiratory system: air entry fair , no wheezing or rhonchi.  Cardiovascular system: s1s2, murmer, RRR.Marland Kitchen  Gastrointestinal system: Abd is soft , non tender non distended bowel sounds good.  Central nervous system alert and non focal.  Extremities: 2+ pedal edema.  Skin: lesions in the perineum and buttocks.  Psychiatry: mood is appropriate.     Data Reviewed: I have personally reviewed following labs and imaging studies  CBC: Recent Labs  Lab 05/08/19 0856 05/10/19 1128 05/11/19 1806 05/11/19 1808 05/12/19 0014 05/13/19 0312  WBC 3.4* 4.0 3.8*  --  2.9* 2.6*  NEUTROABS  --  3.4 3.2  --   --   --   HGB 10.7* 10.6* 9.5* 9.9* 9.7* 9.4*  HCT 33.7* 32.6* 30.3* 29.0* 30.3* 27.5*  MCV 91.1 90.1 91.5  --  90.2 88.1  PLT 131* 128* 120*  --  128* 932*   Basic Metabolic Panel: Recent Labs  Lab 05/11/19 1739 05/11/19 1808 05/13/19 0312  NA 130* 131* 131*  K 3.6 3.7 3.6  CL 94* 93* 98  CO2 26  --  26  GLUCOSE 159* 155* 112*  BUN 9 9 6*  CREATININE 0.72 0.70 0.56  CALCIUM 9.1  --  8.9   GFR: Estimated Creatinine Clearance: 53.8 mL/min (by C-G formula based on SCr of 0.56 mg/dL). Liver Function Tests: Recent Labs  Lab 05/11/19 1739  AST 21  ALT 16  ALKPHOS 74  BILITOT 0.3  PROT 6.5  ALBUMIN 3.2*   No results for input(s): LIPASE,  AMYLASE in the last 168 hours. No results for input(s): AMMONIA in the last 168 hours. Coagulation Profile: Recent Labs  Lab 05/11/19 1806  INR 1.0   Cardiac Enzymes: No results for input(s): CKTOTAL, CKMB, CKMBINDEX, TROPONINI in the last 168 hours. BNP (last 3 results) No results for input(s): PROBNP in the last 8760 hours. HbA1C: No results for input(s): HGBA1C in the last 72 hours. CBG: Recent Labs  Lab 05/12/19 2039 05/13/19 0728 05/13/19 0829 05/13/19 1120 05/13/19 1637  GLUCAP 157* 56* 195* 124* 171*   Lipid Profile: No results for input(s): CHOL, HDL, LDLCALC, TRIG, CHOLHDL, LDLDIRECT in the last 72 hours. Thyroid Function Tests: No results for input(s): TSH, T4TOTAL, FREET4, T3FREE, THYROIDAB in the last 72 hours. Anemia Panel: No results for input(s): VITAMINB12, FOLATE, FERRITIN, TIBC, IRON, RETICCTPCT in the last 72 hours. Sepsis Labs: No results for input(s): PROCALCITON, LATICACIDVEN in the last 168 hours.  Recent Results (from the past 240 hour(s))  SARS CORONAVIRUS 2 (TAT 6-24 HRS) Nasopharyngeal Nasopharyngeal Swab     Status: None   Collection Time: 05/11/19  7:14 PM   Specimen: Nasopharyngeal Swab  Result Value Ref Range Status   SARS Coronavirus 2 NEGATIVE NEGATIVE Final    Comment: (NOTE) SARS-CoV-2 target nucleic acids are NOT DETECTED. The SARS-CoV-2 RNA is generally detectable in upper and lower respiratory specimens during the acute phase of infection. Negative results do not preclude SARS-CoV-2 infection, do not rule out co-infections with other pathogens, and should not be used as the sole basis for treatment or other patient management decisions. Negative results must be combined with clinical observations, patient history, and epidemiological information. The expected result is Negative. Fact Sheet for Patients: SugarRoll.be Fact Sheet for Healthcare Providers: https://www.woods-mathews.com/ This  test is not yet approved or cleared by the [place] FDA and  has been authorized for detection and/or diagnosis of SARS-CoV-2 by FDA under an Emergency Use Authorization (EUA). This EUA will remain  in effect (meaning this test can be used) for the duration of the COVID-19 declaration under Section 56 4(b)(1) of the Act, 21 U.S.C. section 360bbb-3(b)(1), unless the authorization is terminated or revoked sooner. Performed at [facility], [place] [address], [place], [place] [address]          Radiology Studies: Ct Head Wo Contrast  Result Date: 05/11/2019 CLINICAL DATA:  Altered level of consciousness EXAM: CT HEAD WITHOUT CONTRAST TECHNIQUE: Contiguous axial images were obtained from the base of the skull through the vertex without intravenous contrast. COMPARISON:  07/23/2014 FINDINGS: Brain: There is atrophy and chronic small vessel disease changes. No acute intracranial abnormality. Specifically, no hemorrhage, hydrocephalus, mass lesion, acute infarction, or significant intracranial injury. Vascular: No hyperdense vessel or unexpected calcification. Skull: No acute calvarial abnormality. Sinuses/Orbits: Visualized paranasal  sinuses and mastoids clear. Orbital soft tissues unremarkable. Other: None IMPRESSION: Atrophy, chronic microvascular disease. No acute intracranial abnormality. Electronically Signed   By: Rolm Baptise M.D.   On: 05/11/2019 20:50        Scheduled Meds: . Gerhardt's butt cream  1 application Topical BID  . insulin aspart  0-5 Units Subcutaneous QHS  . insulin aspart  0-9 Units Subcutaneous TID WC   Continuous Infusions:   LOS: 1 day        Hosie Poisson, MD [facility] Pager [phone]  If 7PM-7AM, please contact night-coverage [email or web address] Password [facility] 05/13/2019, 7:38 PM

## 2019-05-13 NOTE — Progress Notes (Signed)
Notified by nursing from some bradycardia. Tele with sinus brady high 40s to mid mids, occasional sinus pause with junctional escapte. 12 lead shows SR borderline first degree AV block, bifascicular block. She has clear evidence of chronic conduction disease, perhaps related to her aortic stenosis.  Not on any av nodal agents. Bp's are stable. Monitor at this time   Carlyle Dolly MD

## 2019-05-13 NOTE — Evaluation (Signed)
Occupational Therapy Evaluation Patient Details Name: Hailey Cobb MRN: [id number] DOB: 1931/12/05 Today's Date: 05/13/2019    History of Present Illness 85 y.o. female with medical history significant of chronic diastolic congestive heart failure, non-insulin-dependent diabetes mellitus, severe stage D aortic valve stenosis, RBBB, tachybradycardia syndrome, hypertension, hyperlipidemia, hypothyroidism, subdural hematoma, seizure, vulvar cancer on radiation presented to the hospital for evaluation of syncope. Admitted for observation 05/11/19   Clinical Impression   Pt PTA: living with family and mostly independent with ADL, recently admitted under OBS and now back s/p syncope. Pt currently supervision level for ADL and mobility in room with RW. Pt requires additional stability for initial standing balance. Pt stood at sink for light grooming. Pt would benefit from continued OT skilled services for increasing to PLOF in ADL, mobility and safety in [facility] setting. OT following acutely.    Follow Up Recommendations  Home health OT    Equipment Recommendations  3 in 1 bedside commode    Recommendations for Other Services       Precautions / Restrictions Precautions Precautions: Fall Precaution Comments: syncope and fall precipitating hospitalization Restrictions Weight Bearing Restrictions: No      Mobility Bed Mobility Overal bed mobility: Modified Independent                Transfers Overall transfer level: Needs assistance Equipment used: Rolling walker (2 wheeled) Transfers: Sit to/from Stand Sit to Stand: Supervision         General transfer comment: supervision for safety, good power up and steadying, increased effort    Balance Overall balance assessment: Needs assistance Sitting-balance support: Feet supported;No upper extremity supported Sitting balance-Leahy Scale: Good       Standing balance-Leahy Scale: Fair Standing balance comment: RW for support                            ADL either performed or assessed with clinical judgement   ADL Overall ADL's : Needs assistance/impaired Eating/Feeding: Modified independent;Sitting   Grooming: Supervision/safety;Standing   Upper Body Bathing: Supervision/ safety;Standing   Lower Body Bathing: Supervison/ safety;Sitting/lateral leans;Sit to/from stand   Upper Body Dressing : Supervision/safety;Sitting   Lower Body Dressing: Supervision/safety;Sitting/lateral leans;Sit to/from stand   Toilet Transfer: Supervision/safety   Toileting- Water quality scientist and Hygiene: Supervision/safety       Functional mobility during ADLs: Min guard;Rolling walker General ADL Comments: Pt supervisionA for ADL. Pt requiring increased effort and time.      Vision Baseline Vision/History: Wears glasses Wears Glasses: At all times Vision Assessment?: No apparent visual deficits     Perception     Praxis      Pertinent Vitals/Pain Faces Pain Scale: Hurts a little bit Pain Location: pelvic area from radiation  Pain Descriptors / Indicators: Discomfort;Grimacing     Hand Dominance Right   Extremity/Trunk Assessment Upper Extremity Assessment Upper Extremity Assessment: Generalized weakness;RUE deficits/detail;LUE deficits/detail RUE Deficits / Details: ROM and MMT, WFLs RUE Coordination: WNL LUE Deficits / Details: ROM and MMT, WFLs LUE Coordination: WNL   Lower Extremity Assessment Lower Extremity Assessment: Defer to PT evaluation   Cervical / Trunk Assessment Cervical / Trunk Assessment: Normal   Communication Communication Communication: No difficulties   Cognition Arousal/Alertness: Awake/alert Behavior During Therapy: WFL for tasks assessed/performed Overall Cognitive Status: Within Functional Limits for tasks assessed  General Comments  VSS. HR increases to 110 BPM with exertion.    Exercises     Shoulder  Instructions      Home Living Family/patient expects to be discharged to:: Private residence Living Arrangements: Spouse/significant other Available Help at Discharge: Available PRN/intermittently Type of Home: House Home Access: Ramped entrance Entrance Stairs-Number of Steps: 2-3 Entrance Stairs-Rails: Can reach both Home Layout: One level     Bathroom Shower/Tub: Tub/shower unit;Curtain   Biochemist, clinical: Standard Bathroom Accessibility: Yes   Home Equipment: Grab bars - tub/shower;Walker - 2 wheels;Shower seat   Additional Comments: husband has dementia with CGs in AM for ADL routine M-F; Pt performs ADL/IADL independently until recently      Prior Functioning/Environment Level of Independence: Needs assistance  Gait / Transfers Assistance Needed: independent with very short distance ambulation with RW,  ADL's / Homemaking Assistance Needed: performs ADLs with difficulty, daughter assists with iADLs            OT Problem List: Decreased strength;Decreased activity tolerance;Decreased knowledge of use of DME or AE;Cardiopulmonary status limiting activity;Pain      OT Treatment/Interventions: Self-care/ADL training;Therapeutic exercise;Energy conservation;DME and/or AE instruction;Therapeutic activities;Patient/family education;Balance training    OT Goals(Current goals can be found in the care plan section) Acute Rehab OT Goals Patient Stated Goal: to get stronger and back to being independent OT Goal Formulation: With patient Time For Goal Achievement: 05/19/19 Potential to Achieve Goals: Good ADL Goals Additional ADL Goal #1: Pt will verbalize 3 energy conservation techniques. Additional ADL Goal #2: Pt will increase to modified independence for OOB ADL  OT Frequency: Min 2X/week   Barriers to D/C:            Co-evaluation              AM-PAC OT "6 Clicks" Daily Activity     Outcome Measure Help from another person eating meals?: None Help from  another person taking care of personal grooming?: A Little Help from another person toileting, which includes using toliet, bedpan, or urinal?: A Little Help from another person bathing (including washing, rinsing, drying)?: A Little Help from another person to put on and taking off regular upper body clothing?: A Little Help from another person to put on and taking off regular lower body clothing?: A Little 6 Click Score: 19   End of Session Equipment Utilized During Treatment: Rolling walker Nurse Communication: Mobility status  Activity Tolerance: Patient tolerated treatment well Patient left: in bed;with call bell/phone within reach;with bed alarm set  OT Visit Diagnosis: Unsteadiness on feet (R26.81);Muscle weakness (generalized) (M62.81) Pain - part of body: Knee                Time: 0825-0850 OT Time Calculation (min): 25 min Charges:  OT General Charges $OT Visit: 1 Visit OT Evaluation $OT Eval Moderate Complexity: 1 Mod OT Treatments $Self Care/Home Management : 8-22 mins  Ebony Hail Harold Hedge) Marsa Aris OTR/L Acute Rehabilitation Services Pager: [phone] Office: [place] 05/13/2019, 4:33 PM

## 2019-05-13 NOTE — Progress Notes (Signed)
Hypoglycemic Event  CBG: 56  Treatment: Pt. Ate breakfast   Symptoms: Denied symptoms.  Follow-up CBG: Time: 8:29am  CBG Result:195  Possible Reasons for Event: Pt haven't eat since dinner.       Hailey Cobb

## 2019-05-13 NOTE — Progress Notes (Signed)
      Dr Emily Filbert rounding note as well at PA Thompson's note from the structural heart team reviewed, tenative considerations for TAVR on Tuesday. No additional recs over the weekend, structural team to round on patient Monday. Call over weekend with questions.    Merrily Pew, MD  05/13/2019, 7:42 AM

## 2019-05-13 NOTE — Progress Notes (Signed)
05/13/2019 PT TAVR Pre-Assessment  HPI: 85 y.o. female with medical history significant of chronic diastolic congestive heart failure, non-insulin-dependent diabetes mellitus, severe stage D aortic valve stenosis, RBBB, tachybradycardia syndrome, hypertension, hyperlipidemia, hypothyroidism, subdural hematoma, seizure, vulvar cancer on radiation presented to the hospital for evaluation of syncope. Admitted for observation 05/11/19  Clinical Impression Statement: Pt is a 86 y.o.female being assessed for pre-TAVR.  Pt reports symptoms of SOB with activity.  Pt has functional strength, and is generally steady with gait using a RW.  Pt ambulated 1038ft during the 6 minute walk test requiring 3 rest breaks with max HR of 95, lowest O2 sat 98, BP 146/53 during mobility.  5 meter walk test produced an average gait speed of 1.73 which indicates high fall risk, but pt noticeably faster on the 6 min walk test.  RPE was 11-14 and dyspnea was 2-3 during mobility.  Pt was limited by fatigue.  Pt's frailty rating was 5 which is considered mildly frail.  Pt would benefit from continued PT in the acute care setting due to the above listed deficits in balance, strength, endurance and activity tolerance.    General UE/LE Strength and ROM:  Strength (0-5/5) ROM (limited/full)  R UE WFL   L UE WFL   R LE WFL, generally weak   L LE WFL, generally weak     6 Minute Walk Test:   Total Distance Walked:1080 ft.    Did the pt need a rest break? Yes If yes, why? Pain:No; Fatigue:No; Dyspnea/O2 saturations: Yes Comments:    Pre-Test Post-Test  BP  146/53  HR 68 90's  O2 saturations (indicated RA or L/min [place]) 98% 95%  Modified Borg Dyspnea Scale (0 none-10 maximal) 2 3  RPE (6 very light-10 very hard) 11 14  Comments:   5 Meter Walk Test:   Trial 1 9.08 seconds  Trial 2 9.00  Trial 3 9.53  3 Trial Average/Gait Speed 9.20 seconds/1.73 ft/sec (<1.8 ft/sec indicates high fall risk)  Comments: pt appeared to be  able to go at a higher speed but chose not to.  Noticeably faster cadence on 6 min test.   Clinical Frailty Scale (1 very fit - 9 terminally ill): 5 (</= 5/12 is considered frail)   @SIGNATURE @ 05/13/2019  Donnella Sham, [place] [phone]  (pager) [phone]  (office)

## 2019-05-14 LAB — GLUCOSE, CAPILLARY
Glucose-Capillary: 116 mg/dL — ABNORMAL HIGH (ref 70–99)
Glucose-Capillary: 225 mg/dL — ABNORMAL HIGH (ref 70–99)
Glucose-Capillary: 123 mg/dL — ABNORMAL HIGH (ref 70–99)
Glucose-Capillary: 168 mg/dL — ABNORMAL HIGH (ref 70–99)

## 2019-05-14 MED ORDER — ALPRAZOLAM 0.5 MG PO TABS
0.5000 mg | ORAL_TABLET | Freq: Every day | ORAL | Status: DC | PRN
  Administered 2019-05-15 – 2019-05-17 (×3): 0.5 mg via ORAL
  Filled 2019-05-14 (×3): qty 1

## 2019-05-14 MED ORDER — ROSUVASTATIN CALCIUM 20 MG PO TABS
20.0000 mg | ORAL_TABLET | ORAL | Status: DC
  Administered 2019-05-15 – 2019-05-17 (×2): 20 mg via ORAL
  Filled 2019-05-14 (×2): qty 1

## 2019-05-14 MED ORDER — ALPRAZOLAM 0.5 MG PO TABS
0.5000 mg | ORAL_TABLET | Freq: Every day | ORAL | Status: DC
  Administered 2019-05-14 – 2019-05-17 (×4): 0.5 mg via ORAL
  Filled 2019-05-14 (×4): qty 1

## 2019-05-14 MED ORDER — LEVOTHYROXINE SODIUM 25 MCG PO TABS
125.0000 ug | ORAL_TABLET | Freq: Every day | ORAL | Status: DC
  Administered 2019-05-15 – 2019-05-18 (×4): 125 ug via ORAL
  Filled 2019-05-14 (×4): qty 1

## 2019-05-14 MED ORDER — PANTOPRAZOLE SODIUM 40 MG PO TBEC
40.0000 mg | DELAYED_RELEASE_TABLET | Freq: Every day | ORAL | Status: DC
  Administered 2019-05-15 – 2019-05-17 (×3): 40 mg via ORAL
  Filled 2019-05-14 (×3): qty 1

## 2019-05-14 MED ORDER — LEVETIRACETAM 250 MG PO TABS
500.0000 mg | ORAL_TABLET | Freq: Every day | ORAL | Status: DC
  Administered 2019-05-14 – 2019-05-17 (×4): 500 mg via ORAL
  Filled 2019-05-14 (×4): qty 1

## 2019-05-14 NOTE — Progress Notes (Signed)
PROGRESS NOTE    Hailey Cobb  VOH:[id number] DOB: May 13, 1932 DOA: 05/11/2019 PCP: Hulan Fess, MD    Brief Narrative:  85 year old lady with prior h/o chronic diastolic heart failure, DM, severe aortic valve stenosis, RBBB, tachybrady syndrome, hypertension, hyperlipidemia, hypothyroidism, subdural hematoma, vulvar cancer currently getting xrt presents to ED for syncope.    Patient seen and examined at bedside's no diarrhea noted since admission, can DC enteric precautions once the GI pathogen PCR is negative.   Assessment & Plan:   Principal Problem:   Syncope Active Problems:   Diabetes mellitus ([place])   Essential hypertension   Aortic stenosis   Diarrhea   Syncope:  Differential include severe critical aortic stenosis, vs dehydration from diarrhea. Vs orthostatic hypotension Pt does not appear toxic. No fever or chills.  No sob or chest pain.  CT head negative.  Echocardiogram on 02/2019 shows The left ventricle has normal systolic function with an ejection fraction of 60-65%. The cavity size was normal. There is mild concentric left ventricular hypertrophy. Left ventricular diastolic Doppler parameters are consistent with pseudonormalization. Elevated mean left atrial pressure No evidence of left ventricular regional wall motion abnormalities. Cardiology consulted, plan for TAVR next Tuesday after discussion with her gyn oncology and radiation oncology.  No dizziness or complaints at this time and further management as per cardiothoracic surgery.   Diabetes Mellitus:  CBG (last 3)  Recent Labs    05/14/19 0755 05/14/19 1057 05/14/19 1724  GLUCAP 116* 225* 123*   Continue with sliding scale insulin   Hypertension;  Well controlled.  No changes in medications    Diarrhea  Resolved.   Chronic diastolic heart failure:  She appears to be compensated.   Mild hyponatremia  Recheck BMP in the morning.  She does not appear to be fluid overloaded.    Normocytic anemia/anemia of chronic disease Hemoglobin stable around 9 Transfuse to keep hemoglobin greater than 7.   Hypothyroidism Continue with Synthroid.  History of seizures Continue with Keppra.  No seizure episode.  Anemia, mild thrombocytopenia and leukopenia Probably secondary to malignancy.  History of vulvar cancer on radiation treatment which is on hold at this time. Outpatient follow-up with GYN oncology and radiation oncology.  DVT prophylaxis: scd's Code Status: full code.  Family Communication: none at bedside.  Disposition Plan: pending cardiology recommendations.    Consultants:   Cardiology   Cardiothoracic surgery.    Procedures: none.    Antimicrobials:none.    Subjective: Denies any diarrhea, no chest pain or shortness of breath no dizziness No nausea vomiting or abdominal pain  Objective: Vitals:   05/13/19 0732 05/13/19 2020 05/14/19 0419 05/14/19 1507  BP: (!) 145/62 (!) 131/52 (!) 150/70 (!) 148/65  Pulse: 62 65 61 61  Resp: 19 16 18 19   Temp:  98.3 F (36.8 C) 97.6 F (36.4 C) 98.2 F (36.8 C)  TempSrc:  Oral Oral Oral  SpO2: 97% 95% 100% 100%  Weight:   84.2 kg   Height:        Intake/Output Summary (Last 24 hours) at 05/14/2019 1741 Last data filed at 05/14/2019 1454 Gross per 24 hour  Intake -  Output 200 ml  Net -200 ml   Filed Weights   05/12/19 0438 05/13/19 0542 05/14/19 0419  Weight: 85.3 kg 83.7 kg 84.2 kg    Examination:  General exam: She appears comfortable and not in any kind of distress. Respiratory system: Air entry fair, no wheezing or rhonchi Cardiovascular system: S1-S2  heard, regular rate rhythm, murmur present Gastrointestinal system: Abdomen is soft, nontender, nondistended, bowel sounds are good Central nervous system : Alert, oriented to place person and grossly nonfocal Extremities: 2+ pedal edema, no cyanosis Skin: Lesions in the perineum and buttocks probably secondary to radiation therapy.  Psychiatry: Mood is appropriate.    Data Reviewed: I have personally reviewed following labs and imaging studies  CBC: Recent Labs  Lab 05/08/19 0856 05/10/19 1128 05/11/19 1806 05/11/19 1808 05/12/19 0014 05/13/19 0312  WBC 3.4* 4.0 3.8*  --  2.9* 2.6*  NEUTROABS  --  3.4 3.2  --   --   --   HGB 10.7* 10.6* 9.5* 9.9* 9.7* 9.4*  HCT 33.7* 32.6* 30.3* 29.0* 30.3* 27.5*  MCV 91.1 90.1 91.5  --  90.2 88.1  PLT 131* 128* 120*  --  128* 818*   Basic Metabolic Panel: Recent Labs  Lab 05/11/19 1739 05/11/19 1808 05/13/19 0312  NA 130* 131* 131*  K 3.6 3.7 3.6  CL 94* 93* 98  CO2 26  --  26  GLUCOSE 159* 155* 112*  BUN 9 9 6*  CREATININE 0.72 0.70 0.56  CALCIUM 9.1  --  8.9   GFR: Estimated Creatinine Clearance: 54 mL/min (by C-G formula based on SCr of 0.56 mg/dL). Liver Function Tests: Recent Labs  Lab 05/11/19 1739  AST 21  ALT 16  ALKPHOS 74  BILITOT 0.3  PROT 6.5  ALBUMIN 3.2*   No results for input(s): LIPASE, AMYLASE in the last 168 hours. No results for input(s): AMMONIA in the last 168 hours. Coagulation Profile: Recent Labs  Lab 05/11/19 1806  INR 1.0   Cardiac Enzymes: No results for input(s): CKTOTAL, CKMB, CKMBINDEX, TROPONINI in the last 168 hours. BNP (last 3 results) No results for input(s): PROBNP in the last 8760 hours. HbA1C: No results for input(s): HGBA1C in the last 72 hours. CBG: Recent Labs  Lab 05/13/19 1637 05/13/19 2056 05/14/19 0755 05/14/19 1057 05/14/19 1724  GLUCAP 171* 134* 116* 225* 123*   Lipid Profile: No results for input(s): CHOL, HDL, LDLCALC, TRIG, CHOLHDL, LDLDIRECT in the last 72 hours. Thyroid Function Tests: No results for input(s): TSH, T4TOTAL, FREET4, T3FREE, THYROIDAB in the last 72 hours. Anemia Panel: No results for input(s): VITAMINB12, FOLATE, FERRITIN, TIBC, IRON, RETICCTPCT in the last 72 hours. Sepsis Labs: No results for input(s): PROCALCITON, LATICACIDVEN in the last 168 hours.  Recent  Results (from the past 240 hour(s))  SARS CORONAVIRUS 2 (TAT 6-24 HRS) Nasopharyngeal Nasopharyngeal Swab     Status: None   Collection Time: 05/11/19  7:14 PM   Specimen: Nasopharyngeal Swab  Result Value Ref Range Status   SARS Coronavirus 2 NEGATIVE NEGATIVE Final    Comment: (NOTE) SARS-CoV-2 target nucleic acids are NOT DETECTED. The SARS-CoV-2 RNA is generally detectable in upper and lower respiratory specimens during the acute phase of infection. Negative results do not preclude SARS-CoV-2 infection, do not rule out co-infections with other pathogens, and should not be used as the sole basis for treatment or other patient management decisions. Negative results must be combined with clinical observations, patient history, and epidemiological information. The expected result is Negative. Fact Sheet for Patients: SugarRoll.be Fact Sheet for Healthcare Providers: https://www.woods-mathews.com/ This test is not yet approved or cleared by the [place] FDA and  has been authorized for detection and/or diagnosis of SARS-CoV-2 by FDA under an Emergency Use Authorization (EUA). This EUA will remain  in effect (meaning this test can be used)  for the duration of the COVID-19 declaration under Section 56 4(b)(1) of the Act, 21 U.S.C. section 360bbb-3(b)(1), unless the authorization is terminated or revoked sooner. Performed at [facility], [place] [address], [place], [place] [address]          Radiology Studies: No results found.      Scheduled Meds: . Gerhardt's butt cream  1 application Topical BID  . insulin aspart  0-5 Units Subcutaneous QHS  . insulin aspart  0-9 Units Subcutaneous TID WC   Continuous Infusions:   LOS: 2 days        Hosie Poisson, MD [facility] Pager [phone]  If 7PM-7AM, please contact night-coverage [email or web address] Password [facility] 05/14/2019, 5:41 PM

## 2019-05-15 ENCOUNTER — Ambulatory Visit: Payer: Medicare Other

## 2019-05-15 ENCOUNTER — Inpatient Hospital Stay (HOSPITAL_COMMUNITY): Payer: Medicare Other

## 2019-05-15 ENCOUNTER — Encounter (HOSPITAL_COMMUNITY): Payer: Self-pay | Admitting: Physician Assistant

## 2019-05-15 ENCOUNTER — Other Ambulatory Visit: Payer: Self-pay | Admitting: Physician Assistant

## 2019-05-15 DIAGNOSIS — I5032 Chronic diastolic (congestive) heart failure: Secondary | ICD-10-CM

## 2019-05-15 DIAGNOSIS — I350 Nonrheumatic aortic (valve) stenosis: Secondary | ICD-10-CM

## 2019-05-15 DIAGNOSIS — I10 Essential (primary) hypertension: Secondary | ICD-10-CM

## 2019-05-15 DIAGNOSIS — E119 Type 2 diabetes mellitus without complications: Secondary | ICD-10-CM

## 2019-05-15 LAB — COMPREHENSIVE METABOLIC PANEL
ALT: 19 U/L (ref 0–44)
AST: 22 U/L (ref 15–41)
Albumin: 3.4 g/dL — ABNORMAL LOW (ref 3.5–5.0)
Alkaline Phosphatase: 78 U/L (ref 38–126)
Anion gap: 11 (ref 5–15)
BUN: 13 mg/dL (ref 8–23)
CO2: 25 mmol/L (ref 22–32)
Calcium: 9.8 mg/dL (ref 8.9–10.3)
Chloride: 94 mmol/L — ABNORMAL LOW (ref 98–111)
Creatinine, Ser: 0.66 mg/dL (ref 0.44–1.00)
GFR calc Af Amer: >60 mL/min (ref >60)
GFR calc non Af Amer: >60 mL/min (ref >60)
Glucose, Bld: 181 mg/dL — ABNORMAL HIGH (ref 70–99)
Potassium: 3.8 mmol/L (ref 3.5–5.1)
Sodium: 130 mmol/L — ABNORMAL LOW (ref 135–145)
Total Bilirubin: 0.1 mg/dL — ABNORMAL LOW (ref 0.3–1.2)
Total Protein: 7 g/dL (ref 6.5–8.1)

## 2019-05-15 LAB — CBC
HCT: 33.5 % — ABNORMAL LOW (ref 36.0–46.0)
HCT: 31 % — ABNORMAL LOW (ref 36.0–46.0)
Hemoglobin: 10.8 g/dL — ABNORMAL LOW (ref 12.0–15.0)
Hemoglobin: 10.4 g/dL — ABNORMAL LOW (ref 12.0–15.0)
MCH: 29.1 pg (ref 26.0–34.0)
MCH: 30 pg (ref 26.0–34.0)
MCHC: 32.2 g/dL (ref 30.0–36.0)
MCHC: 33.5 g/dL (ref 30.0–36.0)
MCV: 90.3 fL (ref 80.0–100.0)
MCV: 89.3 fL (ref 80.0–100.0)
Platelets: 150 10*3/uL (ref 150–400)
Platelets: 147 10*3/uL — ABNORMAL LOW (ref 150–400)
RBC: 3.71 MIL/uL — ABNORMAL LOW (ref 3.87–5.11)
RBC: 3.47 MIL/uL — ABNORMAL LOW (ref 3.87–5.11)
RDW: 16.6 % — ABNORMAL HIGH (ref 11.5–15.5)
RDW: 16.5 % — ABNORMAL HIGH (ref 11.5–15.5)
WBC: 2.7 10*3/uL — ABNORMAL LOW (ref 4.0–10.5)
WBC: 3.1 10*3/uL — ABNORMAL LOW (ref 4.0–10.5)
nRBC: 0 % (ref 0.0–0.2)
nRBC: 0 % (ref 0.0–0.2)

## 2019-05-15 LAB — BASIC METABOLIC PANEL
Anion gap: 9 (ref 5–15)
BUN: 7 mg/dL — ABNORMAL LOW (ref 8–23)
CO2: 29 mmol/L (ref 22–32)
Calcium: 9.5 mg/dL (ref 8.9–10.3)
Chloride: 97 mmol/L — ABNORMAL LOW (ref 98–111)
Creatinine, Ser: 0.49 mg/dL (ref 0.44–1.00)
GFR calc Af Amer: >60 mL/min (ref >60)
GFR calc non Af Amer: >60 mL/min (ref >60)
Glucose, Bld: 148 mg/dL — ABNORMAL HIGH (ref 70–99)
Potassium: 3.9 mmol/L (ref 3.5–5.1)
Sodium: 135 mmol/L (ref 135–145)

## 2019-05-15 LAB — TYPE AND SCREEN
ABO/RH(D): A POS
Antibody Screen: NEGATIVE

## 2019-05-15 LAB — HEMOGLOBIN A1C
Hgb A1c MFr Bld: 6.1 % — ABNORMAL HIGH (ref 4.8–5.6)
Mean Plasma Glucose: 128.37 mg/dL

## 2019-05-15 LAB — GLUCOSE, CAPILLARY
Glucose-Capillary: 119 mg/dL — ABNORMAL HIGH (ref 70–99)
Glucose-Capillary: 141 mg/dL — ABNORMAL HIGH (ref 70–99)
Glucose-Capillary: 139 mg/dL — ABNORMAL HIGH (ref 70–99)
Glucose-Capillary: 148 mg/dL — ABNORMAL HIGH (ref 70–99)

## 2019-05-15 LAB — TSH: TSH: 9.841 u[IU]/mL — ABNORMAL HIGH (ref 0.350–4.500)

## 2019-05-15 LAB — PROTIME-INR
INR: 1 (ref 0.8–1.2)
Prothrombin Time: 13.4 seconds (ref 11.4–15.2)

## 2019-05-15 MED ORDER — NOREPINEPHRINE 4 MG/250ML-% IV SOLN
0.0000 ug/min | INTRAVENOUS | Status: DC
  Filled 2019-05-15: qty 250

## 2019-05-15 MED ORDER — VANCOMYCIN HCL 10 G IV SOLR
1500.0000 mg | INTRAVENOUS | Status: DC
  Filled 2019-05-15: qty 1500

## 2019-05-15 MED ORDER — BISACODYL 5 MG PO TBEC: 5.0000 mg | DELAYED_RELEASE_TABLET | Freq: Once | ORAL | Status: DC

## 2019-05-15 MED ORDER — SODIUM CHLORIDE 0.9 % IV SOLN
INTRAVENOUS | Status: DC
  Filled 2019-05-15: qty 30

## 2019-05-15 MED ORDER — TEMAZEPAM 15 MG PO CAPS
15.0000 mg | ORAL_CAPSULE | Freq: Once | ORAL | Status: AC | PRN
  Administered 2019-05-15: 15 mg via ORAL
  Filled 2019-05-15: qty 1

## 2019-05-15 MED ORDER — DEXMEDETOMIDINE HCL IN NACL 400 MCG/100ML IV SOLN
0.1000 ug/kg/h | INTRAVENOUS | Status: DC
  Filled 2019-05-15: qty 100

## 2019-05-15 MED ORDER — SODIUM CHLORIDE 0.9 % IV SOLN
1.5000 g | INTRAVENOUS | Status: DC
  Filled 2019-05-15: qty 1.5

## 2019-05-15 MED ORDER — POTASSIUM CHLORIDE 2 MEQ/ML IV SOLN
80.0000 meq | INTRAVENOUS | Status: DC
  Filled 2019-05-15: qty 40

## 2019-05-15 MED ORDER — CHLORHEXIDINE GLUCONATE 0.12 % MT SOLN
15.0000 mL | Freq: Once | OROMUCOSAL | Status: DC
  Filled 2019-05-15: qty 15

## 2019-05-15 MED ORDER — CHLORHEXIDINE GLUCONATE 4 % EX LIQD
1.0000 "application " | Freq: Once | CUTANEOUS | Status: AC
  Administered 2019-05-15: 1 via TOPICAL
  Filled 2019-05-15: qty 90

## 2019-05-15 MED ORDER — MAGNESIUM SULFATE 50 % IJ SOLN
40.0000 meq | INTRAMUSCULAR | Status: DC
  Filled 2019-05-15 (×2): qty 9.85

## 2019-05-15 NOTE — Consult Note (Addendum)
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 normal in appearance. No hydroureteronephrosis. Urinary bladder is normal in appearance.  Stomach/Bowel: Normal appearance of the stomach. No pathologic dilatation of small bowel or  colon. Diverticulum in the proximal jejunum measuring 4.3 x 3.0 cm, without surrounding inflammatory changes. A few scattered colonic diverticulae are noted in the sigmoid colon, without surrounding inflammatory changes to suggest an acute diverticulitis at this time. The appendix is not confidently identified and may be surgically absent. Regardless, there are no inflammatory changes noted adjacent to the cecum to suggest the presence of an acute appendicitis at this time.  Vascular/Lymphatic: Aortic atherosclerosis, without evidence of aneurysm or dissection in the abdominal or pelvic vasculature. Vascular  findings and measurements pertinent to potential TAVR procedure, as detailed below. No lymphadenopathy noted in the abdomen or pelvis.  Reproductive: Status post hysterectomy.  Ovaries are trophic.  Other: No significant volume of ascites.  No pneumoperitoneum.  Musculoskeletal: There are no aggressive appearing lytic or blastic lesions noted in the visualized portions of the skeleton.  VASCULAR MEASUREMENTS PERTINENT TO TAVR:  AORTA:  Minimal Aortic Diameter-17 x 17 mm  Severity of Aortic Calcification-mild  RIGHT PELVIS:  Right Common Iliac Artery -  Minimal Diameter-10.3 x 9.4 mm  Tortuosity-mild  Calcification-none  Right External Iliac Artery -  Minimal Diameter-6.9 x 5.9 mm  Tortuosity-moderate  Calcification-none  Right Common Femoral Artery -  Minimal Diameter-5.9 x 6.8 mm  Tortuosity-mild  Calcification-mild  LEFT PELVIS:  Left Common Iliac Artery -  Minimal Diameter-9.4 x 8.5 mm  Tortuosity-mild  Calcification-mild  Left External Iliac Artery -  Minimal Diameter-6.3 x 5.9 mm  Tortuosity-moderate  Calcification-none  Left Common Femoral Artery -  Minimal Diameter-7.0 x 6.8 mm  Tortuosity-mild  Calcification-none  Review of the MIP images confirms the above findings.  IMPRESSION: 1.  Vascular findings and measurements pertinent to potential TAVR procedure, as detailed above. 2. Severe thickening calcification of the aortic valve, compatible with the reported clinical history of severe aortic stenosis. 3. Aortic atherosclerosis, in addition to three-vessel coronary artery disease. 4. Colonic diverticulosis, without evidence of acute diverticulitis at this time. 5. Additional incidental findings, as above.     Laboratory Data:  Chemistry Recent Labs  Lab 05/11/19 1739 05/11/19 1808 05/13/19 0312 05/15/19 0807  NA 130* 131* 131* 135  K 3.6 3.7 3.6 3.9  CL 94* 93* 98 97*  CO2 26  --  26 29  GLUCOSE 159* 155* 112* 148*  BUN 9 9 6* 7*  CREATININE 0.72 0.70 0.56 0.49  CALCIUM 9.1  --  8.9 9.5  GFRNONAA >60  --  >60 >60  GFRAA >60  --  >60 >60  ANIONGAP 10  --  7 9    Recent Labs  Lab 05/11/19 1739  PROT 6.5  ALBUMIN 3.2*  AST 21  ALT 16  ALKPHOS 74  BILITOT 0.3   Hematology Recent Labs  Lab 05/12/19 0014 05/13/19 0312 05/15/19 0807  WBC 2.9* 2.6* 2.7*  RBC 3.36* 3.12* 3.71*  HGB 9.7* 9.4* 10.8*  HCT 30.3* 27.5* 33.5*  MCV 90.2 88.1 90.3  MCH 28.9 30.1 29.1  MCHC 32.0 34.2 32.2  RDW 16.3* 16.4* 16.6*  PLT 128* 113* 150   Cardiac EnzymesNo results for input(s): TROPONINI in the last 168 hours. No results for input(s): TROPIPOC in the last 168 hours.  BNP Recent Labs  Lab 05/11/19 1727  BNP 145.3*    DDimer No results for input(s): DDIMER in the last 168 hours.  Radiology/Studies:  Ct Head Wo Contrast  Result Date: 05/11/2019 CLINICAL DATA:  Altered level of consciousness EXAM: CT HEAD WITHOUT CONTRAST TECHNIQUE: Contiguous axial images were obtained from the base of the skull through the vertex without intravenous contrast. COMPARISON:  07/23/2014 FINDINGS: Brain: There is atrophy and chronic small vessel disease changes. No acute intracranial abnormality. Specifically, no hemorrhage, hydrocephalus, mass lesion, acute infarction, or  significant intracranial injury. Vascular: No hyperdense vessel or unexpected calcification. Skull: No acute calvarial abnormality. Sinuses/Orbits: Visualized paranasal sinuses and mastoids clear. Orbital soft tissues unremarkable. Other: None IMPRESSION: Atrophy, chronic microvascular disease. No acute intracranial abnormality. Electronically Signed   By: Rolm Baptise M.D.   On: 05/11/2019 20:50  Dg Chest Portable 1 View  Result Date: 05/11/2019 CLINICAL DATA:  Syncope, shortness of breath EXAM: PORTABLE CHEST 1 VIEW COMPARISON:  05/02/2019 FINDINGS: Heart is upper limits normal in size. No confluent airspace opacities, effusions or edema. Bilateral shoulder replacements. No acute bony abnormality. IMPRESSION: No active disease. Electronically Signed   By: Rolm Baptise M.D.   On: 05/11/2019 18:20     STS Risk Calculator: Procedure: AV Replacement  STS Risk Score: Risk of Mortality: 3.194% Renal Failure: 3.804% Permanent Stroke: 1.870% Prolonged Ventilation: 11.065% DSW Infection: 0.107% Reoperation: 3.812% Morbidity or Mortality: 16.491% Short Length of Stay: 19.969% Long Length of Stay: 8.344%  Assessment and Plan:   AJANAE VIRAG is a 85 y.o. female with symptoms of severe, stage D1 aortic stenosis with NYHA Class IV symptoms of progressive dyspnea on exertion and now syncope, leading to her current admission. I have reviewed the patient's recent echocardiogram which is notable for preserved LV systolic function and severe aortic stenosis with peak gradient of 81.72mm Hg and mean transvalvular gradient of 50.8 mm Hg. The patient's dimensionless index is 0.17 and calculated aortic valve area is 0.46 cm.   L/RHC on 04/06/19 showed mild non obstructive CAD with critical AS with an AVA 0.77cm2 and mean/peak gradient 66/80 mmHg respectively.   Cardiac gated CTA of the heart reveals anatomical characteristics consistent with severe aortic stenosis suitable for treatment by transcatheter  aortic valve replacement with a 26 mm Edwards Sapein 3 Ultra valve without any significant complicating features.  CTA of the aorta and iliac vessels demonstrate what appears to be adequate pelvic vascular access to facilitate a transfemoral approach.  I have reviewed the natural history of aortic stenosis with the patient. We have discussed the limitations of medical therapy and the poor prognosis associated with symptomatic aortic stenosis. We have reviewed potential treatment options, including palliative medical therapy, conventional surgical aortic valve replacement, and transcatheter aortic valve replacement. We discussed treatment options in the context of this patient's specific comorbid medical conditions.    The patient's predicted risk of mortality with conventional aortic valve replacement is 3.194% primarily based on her age, chronic diastolic CHF, cancer with ongoing radiation therapy, HTN, DMT2, anemia, and syncope. Other significant comorbid conditions include recent acute blood loss anemia with need for transfusion and advanced conduction disease that will place her at very high risk for needing a pacemaker after TAVR. Her hemoglobin has remained stable and she has not had anymore melanotic stools. We will have to carefully watch her blood counts after TAVR on DAPT. Tentative plan is for 5th case TAVR tomorrow.   Dr. Roxy Manns to follow.      Signed, Angelena Form, PA-C  05/15/2019 10:52 AM   I have seen and examined the patient and agree with the assessment as outlined above by Angelena Form, PA-C.  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  Patient states that she has known of presence of a heart murmur since her childhood.  She has been followed for several years by Dr. Tamala Julian with known history of aortic stenosis that has  gradually progressed in severity.  Echocardiogram performed September 2019 revealed normal left ventricular systolic function with moderately severe aortic stenosis.  Peak velocity across aortic valve was reported 3.86 m/s corresponding to mean transvalvular gradient estimated 36 mmHg at that time.  Over the past year the patient has developed gradual decline in her functional status with worsening exertional shortness of breath and generalized fatigue.  Follow-up echocardiogram performed February 22, 2019 revealed normal left ventricular systolic function with severe aortic stenosis.  Peak velocity across aortic valve was reported as high as 4.5 m/s corresponding to mean transvalvular gradient estimated 51 mmHg and aortic valve area calculated only 0.47 cm.  The DVI was notably quite low at 0.17.  Left and right heart catheterization was performed April 06, 2019 and confirmed the presence of severe, essentially critical aortic stenosis.  Peak to peak and mean transvalvular gradients were measured 80 and 66 mmHg, respectively.  There was 50% smooth narrowing of the proximal left anterior descending coronary artery and otherwise only moderate nonobstructive coronary artery disease.  Right heart pressures were minimally elevated.  Patient was referred to the multidisciplinary heart valve clinic and has been evaluated previously by Dr. Angelena Form.  CT angiography was performed with plans to proceed with further heart team evaluation once the patient had completed her radiation therapy for recently diagnosed vulvar cancer.  The patient was hospitalized earlier this month  with acute blood loss anemia felt possibly due to GI bleed.  Upper GI radiographic contrast study was unrevealing.  Colonoscopy was not performed.  Patient was readmitted to the hospital May 11, 2019 following a syncopal event that occurred in the setting of mild exertion as the patient had been taking care of her husband.  Since hospitalization the patient's hemoglobin has remained stable and there have been no signs of recurrent acute blood loss anemia.  EKGs have revealed sinus rhythm with chronic right bundle branch block and left anterior fascicular block but no signs of advanced heart block to explain the patient's syncopal event.  She describes stable symptoms of exertional shortness of breath and fatigue with low level activity consistent with chronic diastolic congestive heart failure, New York Heart Association functional class III.  She has not had chest pain or chest tightness.  I have personally reviewed the patient's recent transthoracic echocardiogram, diagnostic cardiac catheterization, and CT angiograms.  Echocardiogram reveals trileaflet aortic valve with severe thickening, calcification, and restricted leaflet mobility involving all 3 leaflets.  There is critical aortic stenosis.  Left ventricular systolic function remains normal.  Diagnostic cardiac catheterization confirmed the presence of critical aortic stenosis and was notable for the absence of significant coronary artery disease.  Right heart pressures were minimally elevated.  Cardiac-gated CTA of the heart reveals anatomical characteristics consistent with aortic stenosis suitable for treatment by transcatheter aortic valve replacement without any significant complicating features and CTA of the aorta and iliac vessels demonstrate what appears to be adequate pelvic vascular access to facilitate a transfemoral approach.  I agree the patient would best be treated with transcatheter aortic valve replacement. The patient will be at  high risk for the development of complete heart block requiring permanent pacemaker placement following transcatheter aortic valve replacement.  Otherwise the patient appears to be relatively good candidate, and I would be reluctant to consider this elderly patient with multiple ongoing medical problems to be a candidate for conventional surgical aortic valve replacement under any circumstances.      I spent in excess of 60 minutes during the  conduct of this hospital encounter and >50% of this time involved direct face-to-face encounter with the patient for counseling and/or coordination of their care.    Rexene Alberts, MD 05/15/2019 5:29 PM

## 2019-05-15 NOTE — Progress Notes (Signed)
Occupational Therapy Treatment Patient Details Name: Hailey Cobb MRN: [id number] DOB: 1931/09/21 Today's Date: 05/15/2019    History of present illness 85 y.o. female with medical history significant of chronic diastolic congestive heart failure, non-insulin-dependent diabetes mellitus, severe stage D aortic valve stenosis, RBBB, tachybradycardia syndrome, hypertension, hyperlipidemia, hypothyroidism, subdural hematoma, seizure, vulvar cancer on radiation presented to the hospital for evaluation of syncope. Admitted for observation 05/11/19   OT comments  Pt progressing toward established goals. Pt currently requires supervision-minguard for ADL and functional mobility with intermittent use of RW. Pt demonstrated ability to ambulate short distances without AD support. Educated pt on energy conservation strategies during ADL with provided handout, pt with no questions at this time. Pt will continue to benefit from skilled OT services to maximize safety and independence with ADL/IADL and functional mobility. Will continue to follow acutely and progress as tolerated.      Follow Up Recommendations  Home health OT    Equipment Recommendations  3 in 1 bedside commode    Recommendations for Other Services      Precautions / Restrictions Precautions Precautions: Fall Precaution Comments: syncope and fall precipitating hospitalization Restrictions Weight Bearing Restrictions: No       Mobility Bed Mobility Overal bed mobility: Modified Independent             General bed mobility comments: increased time and effort to come to EoB  Transfers Overall transfer level: Needs assistance Equipment used: Rolling walker (2 wheeled) Transfers: Sit to/from Stand Sit to Stand: Supervision         General transfer comment: supervision for safety;    Balance Overall balance assessment: Needs assistance Sitting-balance support: Feet supported;No upper extremity supported Sitting  balance-Leahy Scale: Good     Standing balance support: No upper extremity supported;During functional activity Standing balance-Leahy Scale: Fair Standing balance comment: stood at windows and manipulated curtains without LOB;bent over to pick item up off ground, no LOB                           ADL either performed or assessed with clinical judgement   ADL Overall ADL's : Needs assistance/impaired                       Lower Body Dressing Details (indicate cue type and reason): demonstrated ability to figure-4 to don/doff socks/shoes Toilet Transfer: Copy Details (indicate cue type and reason): in room ambulation sat on BSC to urinated Toileting- Clothing Manipulation and Hygiene: Supervision/safety       Functional mobility during ADLs: Min guard;Rolling walker;Supervision/safety General ADL Comments: educated pt on energy conservation strategies during ADL with provided handout. Pt required minguard for functional mobility with use of RW;     Vision       Perception     Praxis      Cognition Arousal/Alertness: Awake/alert Behavior During Therapy: WFL for tasks assessed/performed Overall Cognitive Status: Within Functional Limits for tasks assessed                                          Exercises     Shoulder Instructions       General Comments VSS HR up to 107 monitor indicated irregular HR during session    Pertinent Vitals/ Pain       Pain Assessment: Faces Faces  Pain Scale: Hurts a little bit Pain Location: pelvic area from radiation  Pain Descriptors / Indicators: Discomfort;Grimacing Pain Intervention(s): Limited activity within patient's tolerance;Monitored during session  Home Living                                          Prior Functioning/Environment              Frequency  Min 2X/week        Progress Toward Goals  OT Goals(current goals can now  be found in the care plan section)  Progress towards OT goals: Progressing toward goals  Acute Rehab OT Goals Patient Stated Goal: to get stronger and back to being independent OT Goal Formulation: With patient Time For Goal Achievement: 05/19/19 Potential to Achieve Goals: Good ADL Goals Pt Will Transfer to Toilet: with supervision;ambulating Pt Will Perform Toileting - Clothing Manipulation and hygiene: with supervision Pt Will Perform Tub/Shower Transfer: with supervision Additional ADL Goal #1: Pt will verbalize 3 energy conservation techniques. Additional ADL Goal #2: Pt will increase to modified independence for OOB ADL  Plan Discharge plan remains appropriate    Co-evaluation                 AM-PAC OT "6 Clicks" Daily Activity     Outcome Measure   Help from another person eating meals?: None Help from another person taking care of personal grooming?: A Little Help from another person toileting, which includes using toliet, bedpan, or urinal?: A Little Help from another person bathing (including washing, rinsing, drying)?: A Little Help from another person to put on and taking off regular upper body clothing?: A Little Help from another person to put on and taking off regular lower body clothing?: A Little 6 Click Score: 19    End of Session Equipment Utilized During Treatment: Gait belt;Rolling walker  OT Visit Diagnosis: Unsteadiness on feet (R26.81);Muscle weakness (generalized) (M62.81)   Activity Tolerance Patient tolerated treatment well   Patient Left in bed;with call bell/phone within reach   Nurse Communication Mobility status        Time: 7867-6720 OT Time Calculation (min): 23 min  Charges: OT General Charges $OT Visit: 1 Visit OT Treatments $Self Care/Home Management : 23-37 mins  [place] Office: [place] 05/15/2019, 3:13 PM

## 2019-05-15 NOTE — Progress Notes (Signed)
Physical Therapy Treatment Patient Details Name: Hailey Cobb MRN: [id number] DOB: 1931-12-03 Today's Date: 05/15/2019    History of Present Illness 85 y.o. female with medical history significant of chronic diastolic congestive heart failure, non-insulin-dependent diabetes mellitus, severe stage D aortic valve stenosis, RBBB, tachybradycardia syndrome, hypertension, hyperlipidemia, hypothyroidism, subdural hematoma, seizure, vulvar cancer on radiation presented to the hospital for evaluation of syncope. Admitted for observation 05/11/19    PT Comments    Pt was seen for mobility with use of precautions for her enteric limitations.  Pt was gowned and taken to the hall, and on return reviewed LE strengthening ex's.  Pt is feeling positive about her improvement, hoping for a release from hosp soon.  Follow acutely for gait training with safety of pacing, strengthening to LE's and to plan transition to home therapy for monitoring of safety along with her pain symptoms of radiation therapy.  HHPT can manage any signs of functional decline as well from the treatment.  Follow Up Recommendations  Home health PT;Other (comment);Supervision/Assistance - 24 hour     Equipment Recommendations  None recommended by PT    Recommendations for Other Services OT consult     Precautions / Restrictions Precautions Precautions: Fall Precaution Comments: monitor for signs of syncope Restrictions Weight Bearing Restrictions: No    Mobility  Bed Mobility Overal bed mobility: Modified Independent                Transfers Overall transfer level: Modified independent                  Ambulation/Gait Ambulation/Gait assistance: Supervision Gait Distance (Feet): 300 Feet Assistive device: Rolling walker (2 wheeled) Gait Pattern/deviations: Step-through pattern;Decreased stride length;Wide base of support Gait velocity: controlled Gait velocity interpretation: <1.31 ft/sec, indicative of  household ambulator General Gait Details: O2 sats were 97% after gait   Stairs             Wheelchair Mobility    Modified Rankin (Stroke Patients Only)       Balance Overall balance assessment: Needs assistance Sitting-balance support: Feet supported Sitting balance-Leahy Scale: Good     Standing balance support: Bilateral upper extremity supported;During functional activity Standing balance-Leahy Scale: Fair                              Cognition Arousal/Alertness: Awake/alert Behavior During Therapy: WFL for tasks assessed/performed Overall Cognitive Status: Within Functional Limits for tasks assessed                                        Exercises General Exercises - Lower Extremity Long Arc Quad: Strengthening;10 reps Heel Slides: Strengthening;10 reps    General Comments General comments (skin integrity, edema, etc.): monitored O2 sats and HR with no fluctuations       Pertinent Vitals/Pain Pain Assessment: Faces Faces Pain Scale: Hurts a little bit Pain Location: pelvic area from radiation  Pain Descriptors / Indicators: Discomfort Pain Intervention(s): Limited activity within patient's tolerance;Monitored during session;Premedicated before session;Repositioned    Home Living                      Prior Function            PT Goals (current goals can now be found in the care plan section) Acute Rehab PT Goals Patient Stated  Goal: to get stronger and back to being independent Progress towards PT goals: Progressing toward goals    Frequency    Min 3X/week      PT Plan Current plan remains appropriate    Co-evaluation              AM-PAC PT "6 Clicks" Mobility   Outcome Measure  Help needed turning from your back to your side while in a flat bed without using bedrails?: None Help needed moving from lying on your back to sitting on the side of a flat bed without using bedrails?: None Help  needed moving to and from a bed to a chair (including a wheelchair)?: None Help needed standing up from a chair using your arms (e.g., wheelchair or bedside chair)?: None Help needed to walk in hospital room?: A Little Help needed climbing 3-5 steps with a railing? : A Little 6 Click Score: 22    End of Session Equipment Utilized During Treatment: Gait belt Activity Tolerance: Patient limited by fatigue Patient left: with call bell/phone within reach;in chair Nurse Communication: Mobility status PT Visit Diagnosis: Unsteadiness on feet (R26.81);Other abnormalities of gait and mobility (R26.89);Repeated falls (R29.6);History of falling (Z91.81);Muscle weakness (generalized) (M62.81);Difficulty in walking, not elsewhere classified (R26.2)     Time: 8768-1157 PT Time Calculation (min) (ACUTE ONLY): 23 min  Charges:  $Gait Training: 8-22 mins $Therapeutic Exercise: 8-22 mins                    Ramond Dial 05/15/2019, 7:58 PM   Mee Hives, PT MS Acute Rehab Dept. Number: [place] and [place]

## 2019-05-15 NOTE — Progress Notes (Signed)
PROGRESS NOTE    Hailey Cobb  GLO:[id number] DOB: January 25, 1932 DOA: 05/11/2019 PCP: Hulan Fess, MD    Brief Narrative:  85 year old lady with prior h/o chronic diastolic heart failure, DM, severe aortic valve stenosis, RBBB, tachybrady syndrome, hypertension, hyperlipidemia, hypothyroidism, subdural hematoma, vulvar cancer currently getting xrt presents to ED for syncope.   Pt seen and examined at bedside, no diarrhea today.  Worsening pedal edema.    Assessment & Plan:   Principal Problem:   Syncope Active Problems:   Diabetes mellitus ([place])   Essential hypertension   Aortic stenosis   Diarrhea   Syncope:  Differential include severe critical aortic stenosis, vs dehydration from diarrhea. Vs orthostatic hypotension Pt does not appear toxic. No fever or chills.  CT head negative.  Echocardiogram on 02/2019 shows The left ventricle has normal systolic function with an ejection fraction of 60-65%. The cavity size was normal. There is mild concentric left ventricular hypertrophy. Left ventricular diastolic Doppler parameters are consistent with pseudonormalization. Elevated mean left atrial pressure No evidence of left ventricular regional wall motion abnormalities. Cardiology consulted, plan for TAVR tomorrow after discussion with her gyn oncology and radiation oncology.  No dizziness today.     Diabetes Mellitus:  CBG (last 3)  Recent Labs    05/14/19 2026 05/15/19 0728 05/15/19 1110  GLUCAP 168* 119* 141*   Continue with SSI.  No changes in meds. A1c is 6.1   Hypertension;  Well controlled. No changes in meds.     Diarrhea  Resolved.   Chronic diastolic heart failure:  She appears to be compensated. Lasix on hold for syncope.   Mild hyponatremia  Resolved.    Normocytic anemia/anemia of chronic disease Hemoglobin stable around 10.  Transfuse to keep hemoglobin greater than 7.   Hypothyroidism Continue with Synthroid. TSH slightly elevated.   Repeat TSH in 4 weeks.   History of seizures Continue with Keppra.  No seizure episodes during hospitalization.  keppra level ordered. .  Anemia, mild thrombocytopenia and leukopenia Probably secondary to malignancy.  History of vulvar cancer on radiation treatment which is on hold at this time. Outpatient follow-up with GYN oncology and radiation oncology.   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   DVT prophylaxis: scd's Code Status: full code.  Family Communication: none at bedside.  Disposition Plan: pending CTVS recommendations.    Consultants:   Cardiology   Cardiothoracic surgery.    Procedures: none.    Antimicrobials:none.    Subjective: No nausea, vomiting or diarrhea. No dizziness or syncope  Objective: Vitals:   05/14/19 0419 05/14/19 1507 05/14/19 2028 05/15/19 0516  BP: (!) 150/70 (!) 148/65 (!) 145/49 (!) 139/53  Pulse: 61 61 (!) 57 (!) 51  Resp: 18 19    Temp: 97.6 F (36.4 C) 98.2 F (36.8 C) 97.7 F (36.5 C) 98.1 F (36.7 C)  TempSrc: Oral Oral Oral Oral  SpO2: 100% 100% 99% 97%  Weight: 84.2 kg   84.6 kg  Height:        Intake/Output Summary (Last 24 hours) at 05/15/2019 1530 Last data filed at 05/15/2019 1228 Gross per 24 hour  Intake 350 ml  Output 1050 ml  Net -700 ml   Filed Weights   05/13/19 0542 05/14/19 0419 05/15/19 0516  Weight: 83.7 kg 84.2 kg 84.6 kg    Examination:  General exam: comfortable, no distress noted.  Respiratory system: bilateral air entry fair, no wheezing or rhonchi.  Cardiovascular  system: s1s2 heard, RRR, no JVD.  Gastrointestinal system: abd is soft , non tender non distended bowel sounds good.  Central nervous system : alert and oriented.  Extremities: 2+ leg edema, no cyanosis.  Skin: lesions in the perineum  Psychiatry: mood appropriate.     Data Reviewed: I have personally reviewed following labs and imaging studies  CBC: Recent  Labs  Lab 05/10/19 1128 05/11/19 1806 05/11/19 1808 05/12/19 0014 05/13/19 0312 05/15/19 0807  WBC 4.0 3.8*  --  2.9* 2.6* 2.7*  NEUTROABS 3.4 3.2  --   --   --   --   HGB 10.6* 9.5* 9.9* 9.7* 9.4* 10.8*  HCT 32.6* 30.3* 29.0* 30.3* 27.5* 33.5*  MCV 90.1 91.5  --  90.2 88.1 90.3  PLT 128* 120*  --  128* 113* 742   Basic Metabolic Panel: Recent Labs  Lab 05/11/19 1739 05/11/19 1808 05/13/19 0312 05/15/19 0807  NA 130* 131* 131* 135  K 3.6 3.7 3.6 3.9  CL 94* 93* 98 97*  CO2 26  --  26 29  GLUCOSE 159* 155* 112* 148*  BUN 9 9 6* 7*  CREATININE 0.72 0.70 0.56 0.49  CALCIUM 9.1  --  8.9 9.5   GFR: Estimated Creatinine Clearance: 54.1 mL/min (by C-G formula based on SCr of 0.49 mg/dL). Liver Function Tests: Recent Labs  Lab 05/11/19 1739  AST 21  ALT 16  ALKPHOS 74  BILITOT 0.3  PROT 6.5  ALBUMIN 3.2*   No results for input(s): LIPASE, AMYLASE in the last 168 hours. No results for input(s): AMMONIA in the last 168 hours. Coagulation Profile: Recent Labs  Lab 05/11/19 1806  INR 1.0   Cardiac Enzymes: No results for input(s): CKTOTAL, CKMB, CKMBINDEX, TROPONINI in the last 168 hours. BNP (last 3 results) No results for input(s): PROBNP in the last 8760 hours. HbA1C: Recent Labs    05/15/19 0807  HGBA1C 6.1*   CBG: Recent Labs  Lab 05/14/19 1057 05/14/19 1724 05/14/19 2026 05/15/19 0728 05/15/19 1110  GLUCAP 225* 123* 168* 119* 141*   Lipid Profile: No results for input(s): CHOL, HDL, LDLCALC, TRIG, CHOLHDL, LDLDIRECT in the last 72 hours. Thyroid Function Tests: Recent Labs    05/15/19 0807  TSH 9.841*   Anemia Panel: No results for input(s): VITAMINB12, FOLATE, FERRITIN, TIBC, IRON, RETICCTPCT in the last 72 hours. Sepsis Labs: No results for input(s): PROCALCITON, LATICACIDVEN in the last 168 hours.  Recent Results (from the past 240 hour(s))  SARS CORONAVIRUS 2 (TAT 6-24 HRS) Nasopharyngeal Nasopharyngeal Swab     Status: None    Collection Time: 05/11/19  7:14 PM   Specimen: Nasopharyngeal Swab  Result Value Ref Range Status   SARS Coronavirus 2 NEGATIVE NEGATIVE Final    Comment: (NOTE) SARS-CoV-2 target nucleic acids are NOT DETECTED. The SARS-CoV-2 RNA is generally detectable in upper and lower respiratory specimens during the acute phase of infection. Negative results do not preclude SARS-CoV-2 infection, do not rule out co-infections with other pathogens, and should not be used as the sole basis for treatment or other patient management decisions. Negative results must be combined with clinical observations, patient history, and epidemiological information. The expected result is Negative. Fact Sheet for Patients: SugarRoll.be Fact Sheet for Healthcare Providers: https://www.woods-mathews.com/ This test is not yet approved or cleared by the [place] FDA and  has been authorized for detection and/or diagnosis of SARS-CoV-2 by FDA under an Emergency Use Authorization (EUA). This EUA will remain  in effect (meaning  this test can be used) for the duration of the COVID-19 declaration under Section 56 4(b)(1) of the Act, 21 U.S.C. section 360bbb-3(b)(1), unless the authorization is terminated or revoked sooner. Performed at [facility], [place] [address], [place], [place] [address]          Radiology Studies: No results found.      Scheduled Meds: . ALPRAZolam  0.5 mg Oral QHS  . Gerhardt's butt cream  1 application Topical BID  . insulin aspart  0-5 Units Subcutaneous QHS  . insulin aspart  0-9 Units Subcutaneous TID WC  . levETIRAcetam  500 mg Oral Daily  . levothyroxine  125 mcg Oral QAC breakfast  . pantoprazole  40 mg Oral Daily  . rosuvastatin  20 mg Oral Q M,W,F-2000   Continuous Infusions:   LOS: 3 days        Hosie Poisson, MD [facility] Pager [phone]  If 7PM-7AM, please contact night-coverage [email or web address]  Password [email or web address] 05/15/2019, 3:30 PM

## 2019-05-16 ENCOUNTER — Encounter (HOSPITAL_COMMUNITY): Payer: Self-pay | Admitting: Certified Registered Nurse Anesthetist

## 2019-05-16 ENCOUNTER — Encounter (HOSPITAL_COMMUNITY): Payer: Self-pay | Admitting: Surgery

## 2019-05-16 ENCOUNTER — Ambulatory Visit: Payer: Medicare Other

## 2019-05-16 ENCOUNTER — Other Ambulatory Visit (HOSPITAL_COMMUNITY): Payer: Medicare Other

## 2019-05-16 LAB — BLOOD GAS, ARTERIAL
Acid-Base Excess: 2.6 mmol/L — ABNORMAL HIGH (ref 0.0–2.0)
Bicarbonate: 26.6 mmol/L (ref 20.0–28.0)
Drawn by: 25203
O2 Saturation: 96.3 %
Patient temperature: 98.6
pCO2 arterial: 40.7 mmHg (ref 32.0–48.0)
pH, Arterial: 7.43 (ref 7.350–7.450)
pO2, Arterial: 84.6 mmHg (ref 83.0–108.0)

## 2019-05-16 LAB — GLUCOSE, CAPILLARY
Glucose-Capillary: 162 mg/dL — ABNORMAL HIGH (ref 70–99)
Glucose-Capillary: 134 mg/dL — ABNORMAL HIGH (ref 70–99)
Glucose-Capillary: 171 mg/dL — ABNORMAL HIGH (ref 70–99)
Glucose-Capillary: 186 mg/dL — ABNORMAL HIGH (ref 70–99)

## 2019-05-16 LAB — LEVETIRACETAM LEVEL: Levetiracetam Lvl: 5.8 ug/mL — ABNORMAL LOW (ref 10.0–40.0)

## 2019-05-16 MED ORDER — CHLORHEXIDINE GLUCONATE 0.12 % MT SOLN
OROMUCOSAL | Status: AC
  Administered 2019-05-16: 15 mL
  Filled 2019-05-16: qty 15

## 2019-05-16 MED ORDER — CHLORHEXIDINE GLUCONATE 4 % EX LIQD
CUTANEOUS | Status: AC
  Administered 2019-05-16: 12:00:00
  Filled 2019-05-16: qty 60

## 2019-05-16 MED ORDER — LACTATED RINGERS IV SOLN: INTRAVENOUS | Status: DC

## 2019-05-16 NOTE — Progress Notes (Signed)
Progress Note  Patient Name: Hailey Cobb Date of Encounter: 05/16/2019  Primary Cardiologist: Sinclair Grooms, MD   Subjective   No chest pain or dyspnea this am.   Inpatient Medications    Scheduled Meds: . ALPRAZolam  0.5 mg Oral QHS  . bisacodyl  5 mg Oral Once  . chlorhexidine  15 mL Mouth/Throat Once  . Gerhardt's butt cream  1 application Topical BID  . insulin aspart  0-5 Units Subcutaneous QHS  . insulin aspart  0-9 Units Subcutaneous TID WC  . levETIRAcetam  500 mg Oral Daily  . levothyroxine  125 mcg Oral QAC breakfast  . magnesium sulfate  40 mEq Other To OR  . pantoprazole  40 mg Oral Daily  . potassium chloride  80 mEq Other To OR  . rosuvastatin  20 mg Oral Q M,W,F-2000   Continuous Infusions: . cefUROXime (ZINACEF)  IV    . dexmedetomidine    . heparin 30,000 units/NS 1000 mL solution for CELLSAVER    . norepinephrine (LEVOPHED) Adult infusion    . vancomycin     PRN Meds: acetaminophen **OR** acetaminophen, ALPRAZolam, HYDROcodone-acetaminophen   Vital Signs    Vitals:   05/15/19 0516 05/15/19 0915 05/15/19 2120 05/16/19 0436  BP: (!) 139/53  137/61 (!) 150/60  Pulse: (!) 51  60 64  Resp:      Temp: 98.1 F (36.7 C)  97.9 F (36.6 C) 97.7 F (36.5 C)  TempSrc: Oral  Oral Oral  SpO2: 97% 96% 98% 96%  Weight: 84.6 kg   85.1 kg  Height:        Intake/Output Summary (Last 24 hours) at 05/16/2019 1005 Last data filed at 05/16/2019 0443 Gross per 24 hour  Intake 240 ml  Output 1300 ml  Net -1060 ml   Last 3 Weights 05/16/2019 05/15/2019 05/14/2019  Weight (lbs) 187 lb 9.6 oz 186 lb 6.4 oz 185 lb 11.2 oz  Weight (kg) 85.095 kg 84.55 kg 84.233 kg      Telemetry    sinus - Personally Reviewed  ECG    No AM EKG - Personally Reviewed  Physical Exam   GEN: No acute distress.   Neck: No JVD Cardiac: RRR. Systolic murmur.   Respiratory: Clear to auscultation bilaterally. GI: Soft, nontender, non-distended  MS: No edema; No  deformity. Neuro:  Nonfocal  Psych: Normal affect   Labs    High Sensitivity Troponin:   Recent Labs  Lab 05/11/19 1806 05/11/19 2112 05/12/19 0014  TROPONINIHS 29* 49* 53*      Chemistry Recent Labs  Lab 05/11/19 1739  05/13/19 0312 05/15/19 0807 05/15/19 2010  NA 130*   < > 131* 135 130*  K 3.6   < > 3.6 3.9 3.8  CL 94*   < > 98 97* 94*  CO2 26  --  26 29 25   GLUCOSE 159*   < > 112* 148* 181*  BUN 9   < > 6* 7* 13  CREATININE 0.72   < > 0.56 0.49 0.66  CALCIUM 9.1  --  8.9 9.5 9.8  PROT 6.5  --   --   --  7.0  ALBUMIN 3.2*  --   --   --  3.4*  AST 21  --   --   --  22  ALT 16  --   --   --  19  ALKPHOS 74  --   --   --  78  BILITOT  0.3  --   --   --  0.1*  GFRNONAA >60  --  >60 >60 >60  GFRAA >60  --  >60 >60 >60  ANIONGAP 10  --  7 9 11    < > = values in this interval not displayed.     Hematology Recent Labs  Lab 05/13/19 0312 05/15/19 0807 05/15/19 2010  WBC 2.6* 2.7* 3.1*  RBC 3.12* 3.71* 3.47*  HGB 9.4* 10.8* 10.4*  HCT 27.5* 33.5* 31.0*  MCV 88.1 90.3 89.3  MCH 30.1 29.1 30.0  MCHC 34.2 32.2 33.5  RDW 16.4* 16.6* 16.5*  PLT 113* 150 147*    BNP Recent Labs  Lab 05/11/19 1727  BNP 145.3*     DDimer No results for input(s): DDIMER in the last 168 hours.   Radiology    Dg Chest 2 View  Result Date: 05/15/2019 CLINICAL DATA:  Preoperative examination EXAM: CHEST - 2 VIEW COMPARISON:  May 11, 2019 FINDINGS: The heart size and mediastinal contours are within normal limits. Both lungs are clear. The visualized skeletal structures are unremarkable. IMPRESSION: No active cardiopulmonary disease. Electronically Signed   By: Dorise Bullion III M.D   On: 05/15/2019 20:53    Cardiac Studies     Patient Profile     85 y.o. female with history of PVCs, recently diagnosed vulvar cancer, tachycardia/bradycardia syndrome, autoimmune hepatitis, DM, GERD, HTN, hyperlipidemia, hypothyroidism, RBBB, seizures following a subdural hematoma in  2015 and severe aortic stenosis who was undergoing XRT for vulvar cancer and workup for TAVR. She was admitted after a syncopal event. She was anemic but no active blood loss. Plans for TAVR 05/16/19.  Assessment & Plan    1. Severe aortic stenosis: Plans for TAVR from the transfemoral approach later today. Labs reviewed and stable. BP stable. Sinus on telemetry. High risk for pacemaker post TAVR given baseline conduction disease.    For questions or updates, please contact [place] Please consult [email or web address] for contact info under        Signed, Lauree Chandler, MD  05/16/2019, 10:05 AM

## 2019-05-16 NOTE — Progress Notes (Signed)
Physical Therapy Treatment Patient Details Name: Hailey Cobb MRN: [id number] DOB: 1931/11/27 Today's Date: 05/16/2019    History of Present Illness 85 y.o. female with medical history significant of chronic diastolic congestive heart failure, non-insulin-dependent diabetes mellitus, severe stage D aortic valve stenosis, RBBB, tachybradycardia syndrome, hypertension, hyperlipidemia, hypothyroidism, subdural hematoma, seizure, vulvar cancer on radiation presented to the hospital for evaluation of syncope. Admitted for observation 05/11/19    PT Comments    Pt doing well with mobility. Pt's TAVR postponed until Thursday. Will continue to follow until after TAVR to make sure pt's mobility is stable after TAVR   Follow Up Recommendations  Home health PT;Supervision - Intermittent     Equipment Recommendations  None recommended by PT    Recommendations for Other Services OT consult     Precautions / Restrictions Precautions Precautions: Fall Precaution Comments: monitor for signs of syncope Restrictions Weight Bearing Restrictions: No    Mobility  Bed Mobility Overal bed mobility: Modified Independent                Transfers Overall transfer level: Modified independent                  Ambulation/Gait Ambulation/Gait assistance: Supervision Gait Distance (Feet): 350 Feet Assistive device: 4-wheeled walker Gait Pattern/deviations: Step-through pattern;Decreased stride length;Wide base of support   Gait velocity interpretation: >2.62 ft/sec, indicative of community ambulatory General Gait Details: Steady gait using rollator in hallway and no device in room   Stairs             Wheelchair Mobility    Modified Rankin (Stroke Patients Only)       Balance Overall balance assessment: Needs assistance Sitting-balance support: Feet supported Sitting balance-Leahy Scale: Good     Standing balance support: During functional activity;No upper  extremity supported Standing balance-Leahy Scale: Good                              Cognition Arousal/Alertness: Awake/alert Behavior During Therapy: WFL for tasks assessed/performed Overall Cognitive Status: Within Functional Limits for tasks assessed                                        Exercises      General Comments        Pertinent Vitals/Pain Pain Assessment: No/denies pain    Home Living                      Prior Function            PT Goals (current goals can now be found in the care plan section) Progress towards PT goals: Progressing toward goals    Frequency    Min 3X/week      PT Plan Current plan remains appropriate    Co-evaluation              AM-PAC PT "6 Clicks" Mobility   Outcome Measure  Help needed turning from your back to your side while in a flat bed without using bedrails?: None Help needed moving from lying on your back to sitting on the side of a flat bed without using bedrails?: None Help needed moving to and from a bed to a chair (including a wheelchair)?: None Help needed standing up from a chair using your arms (e.g., wheelchair or bedside chair)?:  None Help needed to walk in hospital room?: None Help needed climbing 3-5 steps with a railing? : A Little 6 Click Score: 23    End of Session   Activity Tolerance: Patient tolerated treatment well Patient left: with call bell/phone within reach;in chair Nurse Communication: Other (comment)(cardiac monitor was not on pt since return from downstairs) PT Visit Diagnosis: Unsteadiness on feet (R26.81);Other abnormalities of gait and mobility (R26.89);Repeated falls (R29.6);History of falling (Z91.81);Muscle weakness (generalized) (M62.81);Difficulty in walking, not elsewhere classified (R26.2)     Time: 1252-7129 PT Time Calculation (min) (ACUTE ONLY): 9 min  Charges:  $Gait Training: 8-22 mins                     [place] Pager [phone] Office [place] 05/16/2019, 5:05 PM

## 2019-05-16 NOTE — Progress Notes (Signed)
PROGRESS NOTE    Hailey Cobb  PPJ:[id number] DOB: 1932-03-28 DOA: 05/11/2019 PCP: Hulan Fess, MD    Brief Narrative:  85 year old lady with prior h/o chronic diastolic heart failure, DM, severe aortic valve stenosis, RBBB, tachybrady syndrome, hypertension, hyperlipidemia, hypothyroidism, subdural hematoma, vulvar cancer currently getting xrt presents to ED for syncope.      Assessment & Plan:   Principal Problem:   Syncope Active Problems:   Diabetes mellitus ([place])   Essential hypertension   Aortic stenosis   Diarrhea   Syncope:  Differential include severe critical aortic stenosis, vs dehydration from diarrhea. Vs orthostatic hypotension CT head negative.  Echocardiogram on 02/2019 shows The left ventricle has normal systolic function with an ejection fraction of 60-65%. The cavity size was normal. There is mild concentric left ventricular hypertrophy. Left ventricular diastolic Doppler parameters are consistent with pseudonormalization. Elevated mean left atrial pressure No evidence of left ventricular regional wall motion abnormalities. Cardiology consulted, plan for TAVR TODAY after discussion with her gyn oncology and radiation oncology. No new complaints, patient is looking for ward for the procedure.     Diabetes Mellitus:  CBG (last 3)  Recent Labs    05/15/19 1631 05/15/19 2117 05/16/19 0736  GLUCAP 139* 148* 162*   Continue with SSI.  A1c is 6.1 cbgs well controlled.    Hypertension;  Well controlled. Continue to hold meds.     Diarrhea  Resolved.   Chronic diastolic heart failure:  She appears to be compensated. Lasix on hold for syncope.  CXR does not show any acute cardio pulm disease.   Mild hyponatremia  Resolved.    Normocytic anemia/anemia of chronic disease No obvious signs of bleeding.  Hemoglobin stable around 10.  Transfuse to keep hemoglobin greater than 7.   Hypothyroidism Continue with Synthroid. TSH slightly elevated.   Repeat TSH in 4 weeks.   History of seizures Continue with Keppra.  No seizure episodes during hospitalization.  keppra level ordered. .  Anemia, mild thrombocytopenia and leukopenia Probably secondary to malignancy. Platelets stable around 147000  History of vulvar cancer on radiation treatment which is on hold at this time. Outpatient follow-up with GYN oncology and radiation oncology.   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   DVT prophylaxis: scd's Code Status: full code.  Family Communication: none at bedside.  Disposition Plan: pending CTVS recommendations.    Consultants:   Cardiology   Cardiothoracic surgery.    Procedures: none.    Antimicrobials:none.    Subjective: No complaints.   Objective: Vitals:   05/15/19 0516 05/15/19 0915 05/15/19 2120 05/16/19 0436  BP: (!) 139/53  137/61 (!) 150/60  Pulse: (!) 51  60 64  Resp:      Temp: 98.1 F (36.7 C)  97.9 F (36.6 C) 97.7 F (36.5 C)  TempSrc: Oral  Oral Oral  SpO2: 97% 96% 98% 96%  Weight: 84.6 kg   85.1 kg  Height:        Intake/Output Summary (Last 24 hours) at 05/16/2019 1147 Last data filed at 05/16/2019 1016 Gross per 24 hour  Intake 240 ml  Output 1500 ml  Net -1260 ml   Filed Weights   05/14/19 0419 05/15/19 0516 05/16/19 0436  Weight: 84.2 kg 84.6 kg 85.1 kg    Examination:  General exam: alert and comfortable Respiratory system: Bilateral air entry fair, no wheezing or rhonchi Cardiovascular system: S1-S2 heard, regular rate rhythm, no JVD Gastrointestinal system: Abdomen  is soft, nontender, nondistended, bowel sounds are good Central nervous system : Alert and oriented to place and time and person, grossly nonfocal Extremities: Leg edema present, no cyanosis Skin: Painful lesions in the perineum Psychiatry: Mood is appropriate    Data Reviewed: I have personally reviewed following labs and imaging studies  CBC:  Recent Labs  Lab 05/10/19 1128  05/11/19 1806 05/11/19 1808 05/12/19 0014 05/13/19 0312 05/15/19 0807 05/15/19 2010  WBC 4.0  --  3.8*  --  2.9* 2.6* 2.7* 3.1*  NEUTROABS 3.4  --  3.2  --   --   --   --   --   HGB 10.6*   < > 9.5* 9.9* 9.7* 9.4* 10.8* 10.4*  HCT 32.6*  --  30.3* 29.0* 30.3* 27.5* 33.5* 31.0*  MCV 90.1  --  91.5  --  90.2 88.1 90.3 89.3  PLT 128*  --  120*  --  128* 113* 150 147*   < > = values in this interval not displayed.   Basic Metabolic Panel: Recent Labs  Lab 05/11/19 1739 05/11/19 1808 05/13/19 0312 05/15/19 0807 05/15/19 2010  NA 130* 131* 131* 135 130*  K 3.6 3.7 3.6 3.9 3.8  CL 94* 93* 98 97* 94*  CO2 26  --  26 29 25   GLUCOSE 159* 155* 112* 148* 181*  BUN 9 9 6* 7* 13  CREATININE 0.72 0.70 0.56 0.49 0.66  CALCIUM 9.1  --  8.9 9.5 9.8   GFR: Estimated Creatinine Clearance: 54.3 mL/min (by C-G formula based on SCr of 0.66 mg/dL). Liver Function Tests: Recent Labs  Lab 05/11/19 1739 05/15/19 2010  AST 21 22  ALT 16 19  ALKPHOS 74 78  BILITOT 0.3 0.1*  PROT 6.5 7.0  ALBUMIN 3.2* 3.4*   No results for input(s): LIPASE, AMYLASE in the last 168 hours. No results for input(s): AMMONIA in the last 168 hours. Coagulation Profile: Recent Labs  Lab 05/11/19 1806 05/15/19 2010  INR 1.0 1.0   Cardiac Enzymes: No results for input(s): CKTOTAL, CKMB, CKMBINDEX, TROPONINI in the last 168 hours. BNP (last 3 results) No results for input(s): PROBNP in the last 8760 hours. HbA1C: Recent Labs    05/15/19 0807  HGBA1C 6.1*   CBG: Recent Labs  Lab 05/15/19 0728 05/15/19 1110 05/15/19 1631 05/15/19 2117 05/16/19 0736  GLUCAP 119* 141* 139* 148* 162*   Lipid Profile: No results for input(s): CHOL, HDL, LDLCALC, TRIG, CHOLHDL, LDLDIRECT in the last 72 hours. Thyroid Function Tests: Recent Labs    05/15/19 0807  TSH 9.841*   Anemia Panel: No results for input(s): VITAMINB12, FOLATE, FERRITIN, TIBC, IRON, RETICCTPCT in the last  72 hours. Sepsis Labs: No results for input(s): PROCALCITON, LATICACIDVEN in the last 168 hours.  Recent Results (from the past 240 hour(s))  SARS CORONAVIRUS 2 (TAT 6-24 HRS) Nasopharyngeal Nasopharyngeal Swab     Status: None   Collection Time: 05/11/19  7:14 PM   Specimen: Nasopharyngeal Swab  Result Value Ref Range Status   SARS Coronavirus 2 NEGATIVE NEGATIVE Final    Comment: (NOTE) SARS-CoV-2 target nucleic acids are NOT DETECTED. The SARS-CoV-2 RNA is generally detectable in upper and lower respiratory specimens during the acute phase of infection. Negative results do not preclude SARS-CoV-2 infection, do not rule out co-infections with other pathogens, and should not be used as the sole basis for treatment or other patient management decisions. Negative results must be combined with clinical observations, patient history, and epidemiological information. The  expected result is Negative. Fact Sheet for Patients: SugarRoll.be Fact Sheet for Healthcare Providers: https://www.woods-mathews.com/ This test is not yet approved or cleared by the [place] FDA and  has been authorized for detection and/or diagnosis of SARS-CoV-2 by FDA under an Emergency Use Authorization (EUA). This EUA will remain  in effect (meaning this test can be used) for the duration of the COVID-19 declaration under Section 56 4(b)(1) of the Act, 21 U.S.C. section 360bbb-3(b)(1), unless the authorization is terminated or revoked sooner. Performed at [facility], [place] [address] [place], [place] [address]          Radiology Studies: Dg Chest 2 View  Result Date: 05/15/2019 CLINICAL DATA:  Preoperative examination EXAM: CHEST - 2 VIEW COMPARISON:  May 11, 2019 FINDINGS: The heart size and mediastinal contours are within normal limits. Both lungs are clear. The visualized skeletal structures are unremarkable. IMPRESSION: No active  cardiopulmonary disease. Electronically Signed   By: Dorise Bullion III M.D   On: 05/15/2019 20:53        Scheduled Meds: . ALPRAZolam  0.5 mg Oral QHS  . bisacodyl  5 mg Oral Once  . chlorhexidine      . chlorhexidine  15 mL Mouth/Throat Once  . Gerhardt's butt cream  1 application Topical BID  . insulin aspart  0-5 Units Subcutaneous QHS  . insulin aspart  0-9 Units Subcutaneous TID WC  . levETIRAcetam  500 mg Oral Daily  . levothyroxine  125 mcg Oral QAC breakfast  . magnesium sulfate  40 mEq Other To OR  . pantoprazole  40 mg Oral Daily  . potassium chloride  80 mEq Other To OR  . rosuvastatin  20 mg Oral Q M,W,F-2000   Continuous Infusions: . cefUROXime (ZINACEF)  IV    . dexmedetomidine    . heparin 30,000 units/NS 1000 mL solution for CELLSAVER    . norepinephrine (LEVOPHED) Adult infusion    . vancomycin       LOS: 4 days        Hosie Poisson, MD [facility] Pager [phone]  If 7PM-7AM, please contact night-coverage [email or web address] Password [facility] 05/16/2019, 11:47 AM

## 2019-05-16 NOTE — Progress Notes (Signed)
  [place] VALVE TEAM  Patient was scheduled for TAVR today. Due to scheduling issues we will not be able to get to her case today. Plan to give her a diet and plan for tentative 1st case with Dr. Roxy Manns and Dr. Burt Knack on Thursday 05/18/19. Given significant conduction disease, Dr. Roxy Manns requested EP consultation prior to TAVR.   Angelena Form PA-C  [facility]

## 2019-05-17 ENCOUNTER — Ambulatory Visit: Payer: Medicare Other

## 2019-05-17 DIAGNOSIS — E119 Type 2 diabetes mellitus without complications: Secondary | ICD-10-CM

## 2019-05-17 DIAGNOSIS — R197 Diarrhea, unspecified: Secondary | ICD-10-CM

## 2019-05-17 DIAGNOSIS — E039 Hypothyroidism, unspecified: Secondary | ICD-10-CM

## 2019-05-17 DIAGNOSIS — K219 Gastro-esophageal reflux disease without esophagitis: Secondary | ICD-10-CM

## 2019-05-17 DIAGNOSIS — R7989 Other specified abnormal findings of blood chemistry: Secondary | ICD-10-CM

## 2019-05-17 LAB — CBC WITH DIFFERENTIAL/PLATELET
Abs Immature Granulocytes: 0.01 10*3/uL (ref 0.00–0.07)
Basophils Absolute: 0 10*3/uL (ref 0.0–0.1)
Basophils Relative: 1 %
Eosinophils Absolute: 0.1 10*3/uL (ref 0.0–0.5)
Eosinophils Relative: 3 %
HCT: 30.6 % — ABNORMAL LOW (ref 36.0–46.0)
Hemoglobin: 10.5 g/dL — ABNORMAL LOW (ref 12.0–15.0)
Immature Granulocytes: 0 %
Lymphocytes Relative: 4 %
Lymphs Abs: 0.1 10*3/uL — ABNORMAL LOW (ref 0.7–4.0)
MCH: 30.3 pg (ref 26.0–34.0)
MCHC: 34.3 g/dL (ref 30.0–36.0)
MCV: 88.4 fL (ref 80.0–100.0)
Monocytes Absolute: 0.3 10*3/uL (ref 0.1–1.0)
Monocytes Relative: 13 %
Neutro Abs: 1.9 10*3/uL (ref 1.7–7.7)
Neutrophils Relative %: 79 %
Platelets: 148 10*3/uL — ABNORMAL LOW (ref 150–400)
RBC: 3.46 MIL/uL — ABNORMAL LOW (ref 3.87–5.11)
RDW: 16.5 % — ABNORMAL HIGH (ref 11.5–15.5)
WBC: 2.4 10*3/uL — ABNORMAL LOW (ref 4.0–10.5)
nRBC: 0 % (ref 0.0–0.2)

## 2019-05-17 LAB — GLUCOSE, CAPILLARY
Glucose-Capillary: 149 mg/dL — ABNORMAL HIGH (ref 70–99)
Glucose-Capillary: 149 mg/dL — ABNORMAL HIGH (ref 70–99)
Glucose-Capillary: 204 mg/dL — ABNORMAL HIGH (ref 70–99)
Glucose-Capillary: 109 mg/dL — ABNORMAL HIGH (ref 70–99)

## 2019-05-17 LAB — GI PATHOGEN PANEL BY PCR, STOOL

## 2019-05-17 LAB — BASIC METABOLIC PANEL
Anion gap: 12 (ref 5–15)
BUN: 10 mg/dL (ref 8–23)
CO2: 23 mmol/L (ref 22–32)
Calcium: 9.5 mg/dL (ref 8.9–10.3)
Chloride: 97 mmol/L — ABNORMAL LOW (ref 98–111)
Creatinine, Ser: 0.68 mg/dL (ref 0.44–1.00)
GFR calc Af Amer: >60 mL/min (ref >60)
GFR calc non Af Amer: >60 mL/min (ref >60)
Glucose, Bld: 220 mg/dL — ABNORMAL HIGH (ref 70–99)
Potassium: 4.3 mmol/L (ref 3.5–5.1)
Sodium: 132 mmol/L — ABNORMAL LOW (ref 135–145)

## 2019-05-17 LAB — MAGNESIUM: Magnesium: 1.5 mg/dL — ABNORMAL LOW (ref 1.7–2.4)

## 2019-05-17 MED ORDER — CHLORHEXIDINE GLUCONATE 0.12 % MT SOLN
15.0000 mL | Freq: Once | OROMUCOSAL | Status: AC
  Administered 2019-05-18: 15 mL via OROMUCOSAL

## 2019-05-17 MED ORDER — VANCOMYCIN HCL 10 G IV SOLR
1500.0000 mg | INTRAVENOUS | Status: DC
  Filled 2019-05-17: qty 1500

## 2019-05-17 MED ORDER — CHLORHEXIDINE GLUCONATE 4 % EX LIQD
CUTANEOUS | Status: AC
  Administered 2019-05-17: 22:00:00
  Filled 2019-05-17: qty 120

## 2019-05-17 MED ORDER — CHLORHEXIDINE GLUCONATE 4 % EX LIQD: 1.0000 "application " | Freq: Once | CUTANEOUS | Status: AC

## 2019-05-17 MED ORDER — SODIUM CHLORIDE 0.9 % IV SOLN
INTRAVENOUS | Status: DC
  Filled 2019-05-17: qty 30

## 2019-05-17 MED ORDER — BISACODYL 5 MG PO TBEC
5.0000 mg | DELAYED_RELEASE_TABLET | Freq: Once | ORAL | Status: AC
  Administered 2019-05-17: 5 mg via ORAL

## 2019-05-17 MED ORDER — POTASSIUM CHLORIDE 2 MEQ/ML IV SOLN
80.0000 meq | INTRAVENOUS | Status: DC
  Filled 2019-05-17: qty 40

## 2019-05-17 MED ORDER — POTASSIUM CHLORIDE CRYS ER 20 MEQ PO TBCR
20.0000 meq | EXTENDED_RELEASE_TABLET | Freq: Once | ORAL | Status: AC
  Administered 2019-05-17: 20 meq via ORAL
  Filled 2019-05-17: qty 1

## 2019-05-17 MED ORDER — MAGNESIUM SULFATE 50 % IJ SOLN
40.0000 meq | INTRAMUSCULAR | Status: DC
  Filled 2019-05-17: qty 9.85

## 2019-05-17 MED ORDER — TEMAZEPAM 15 MG PO CAPS: 15.0000 mg | ORAL_CAPSULE | Freq: Once | ORAL | Status: DC | PRN

## 2019-05-17 MED ORDER — DEXMEDETOMIDINE HCL IN NACL 400 MCG/100ML IV SOLN
0.1000 ug/kg/h | INTRAVENOUS | Status: DC
  Filled 2019-05-17: qty 100

## 2019-05-17 MED ORDER — MAGNESIUM SULFATE 4 GM/100ML IV SOLN
4.0000 g | Freq: Once | INTRAVENOUS | Status: AC
  Administered 2019-05-17: 4 g via INTRAVENOUS
  Filled 2019-05-17: qty 100

## 2019-05-17 MED ORDER — NOREPINEPHRINE 4 MG/250ML-% IV SOLN
0.0000 ug/min | INTRAVENOUS | Status: DC
  Filled 2019-05-17: qty 250

## 2019-05-17 MED ORDER — SODIUM CHLORIDE 0.9 % IV SOLN
1.5000 g | INTRAVENOUS | Status: DC
  Filled 2019-05-17: qty 1.5

## 2019-05-17 MED ORDER — FUROSEMIDE 40 MG PO TABS
40.0000 mg | ORAL_TABLET | Freq: Once | ORAL | Status: AC
  Administered 2019-05-17: 40 mg via ORAL
  Filled 2019-05-17: qty 1
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
## 2019-05-17 NOTE — Progress Notes (Addendum)
[place] VALVE TEAM  Patient Name: Hailey Cobb Date of Encounter: 05/17/2019  Primary Cardiologist: Dr. Laurena Bering Problem List     Principal Problem:   Syncope Active Problems:   Diabetes mellitus ([place])   Hypothyroidism   Essential hypertension   Diabetes mellitus without complication ([place])   Severe aortic stenosis   HLD (hyperlipidemia)   GERD (gastroesophageal reflux disease)   Primary vulvar squamous cell carcinoma (HCC)   Acute GI bleeding   Diarrhea     Subjective   No complaints. Having some swelling in her legs. Called later by RN as patient had a pre syncopal episode while moving a chair.   Inpatient Medications    Scheduled Meds: . ALPRAZolam  0.5 mg Oral QHS  . furosemide  40 mg Oral Once  . Gerhardt's butt cream  1 application Topical BID  . insulin aspart  0-5 Units Subcutaneous QHS  . insulin aspart  0-9 Units Subcutaneous TID WC  . levETIRAcetam  500 mg Oral Daily  . levothyroxine  125 mcg Oral QAC breakfast  . pantoprazole  40 mg Oral Daily  . potassium chloride  20 mEq Oral Once  . rosuvastatin  20 mg Oral Q M,W,F-2000   Continuous Infusions: . lactated ringers     PRN Meds: acetaminophen **OR** acetaminophen, ALPRAZolam, HYDROcodone-acetaminophen   Vital Signs    Vitals:   05/16/19 2103 05/17/19 0055 05/17/19 0457 05/17/19 0850  BP: (!) 148/63 (!) 132/91 (!) 137/48 (!) 151/67  Pulse: (!) 59 70 69 64  Resp: 18 18 18 19   Temp: 98.1 F (36.7 C) 97.8 F (36.6 C) 97.8 F (36.6 C) 98.2 F (36.8 C)  TempSrc: Oral Oral Oral Axillary  SpO2: 100% 96% 95%   Weight:   84.6 kg   Height:        Intake/Output Summary (Last 24 hours) at 05/17/2019 1003 Last data filed at 05/17/2019 0458 Gross per 24 hour  Intake 240 ml  Output 600 ml  Net -360 ml   Filed Weights   05/15/19 0516 05/16/19 0436 05/17/19 0457  Weight: 84.6 kg 85.1 kg 84.6 kg    Physical Exam   GEN: Well nourished, well  developed, in no acute distress.  HEENT: Grossly normal.  Neck: Supple, no JVD,  or masses. Cardiac: RRR, harsh SEM @ RUSB murmur with radiation to carotids. No rubs, or gallops. No clubbing, cyanosis, 1-2+ bilateral pitting edema (R>L)   Respiratory:  Respirations regular and unlabored, clear to auscultation bilaterally. GI: Soft, nontender, nondistended, BS + x 4. MS: no deformity or atrophy. Skin: warm and dry, no rash. Neuro:  Strength and sensation are intact. Psych: AAOx3.  Normal affect.  Labs    CBC Recent Labs    05/15/19 0807 05/15/19 2010  WBC 2.7* 3.1*  HGB 10.8* 10.4*  HCT 33.5* 31.0*  MCV 90.3 89.3  PLT 150 852*   Basic Metabolic Panel Recent Labs    05/15/19 0807 05/15/19 2010  NA 135 130*  K 3.9 3.8  CL 97* 94*  CO2 29 25  GLUCOSE 148* 181*  BUN 7* 13  CREATININE 0.49 0.66  CALCIUM 9.5 9.8   Liver Function Tests Recent Labs    05/15/19 2010  AST 22  ALT 19  ALKPHOS 78  BILITOT 0.1*  PROT 7.0  ALBUMIN 3.4*   No results for input(s): LIPASE, AMYLASE in the last 72 hours. Cardiac Enzymes No results for input(s): CKTOTAL, CKMB, CKMBINDEX,  TROPONINI in the last 72 hours. BNP Invalid input(s): POCBNP D-Dimer No results for input(s): DDIMER in the last 72 hours. Hemoglobin A1C Recent Labs    05/15/19 0807  HGBA1C 6.1*   Fasting Lipid Panel No results for input(s): CHOL, HDL, LDLCALC, TRIG, CHOLHDL, LDLDIRECT in the last 72 hours. Thyroid Function Tests Recent Labs    05/15/19 0807  TSH 9.841*    Telemetry    Sinus with PVCs and bigeminy  - Personally Reviewed  ECG    Sinus with 1st deg AV block, RBBB and LAFB, HR 75- Personally Reviewed  Radiology    Dg Chest 2 View  Result Date: 05/15/2019 CLINICAL DATA:  Preoperative examination EXAM: CHEST - 2 VIEW COMPARISON:  May 11, 2019 FINDINGS: The heart size and mediastinal contours are within normal limits. Both lungs are clear. The visualized skeletal structures are  unremarkable. IMPRESSION: No active cardiopulmonary disease. Electronically Signed   By: Dorise Bullion III M.D   On: 05/15/2019 20:53    Cardiac Studies   Echo7/8/20: 1. The left ventricle has normal systolic function with an ejection fraction of 60-65%. The cavity size was normal. There is mild concentric left ventricular hypertrophy. Left ventricular diastolic Doppler parameters are consistent with  pseudonormalization. Elevated mean left atrial pressure No evidence of left ventricular regional wall motion abnormalities. 2. The right ventricle has normal systolic function. The cavity was normal. There is no increase in right ventricular wall thickness. Right ventricular systolic pressure is mildly elevated with an estimated pressure of 45.4 mmHg. 3. Left atrial size was mildly dilated. 4. Mitral valve regurgitation is mild to moderate by color flow Doppler. The MR jet is centrally-directed. 5. The aortic valve is tricuspid. Severely thickening of the aortic valve. Moderate calcification of the aortic valve. Aortic valve regurgitation is mild by color flow Doppler. Severe stenosis of the aortic valve. 6. When compared to the prior study: 04/29/2018, aortic stenosis has progressed and is now severe.  Patient Profile     Hailey Cobb is a 85 y.o. female with a hx of vulvar cancer undergoing XRT, PVCs, tachy/brady syndrome, advanced conduction dz (1st deg AV block, RBBB, LAFB), autoimmune hepatitis, DMT2, GERD, HTN, HLD, hypothryoidism, seizures following a subdural hematoma in 2015, IBS with diarrhea, dysphagia, chronic diastolic heart failure, recent admission for GI bleed and acute blood loss anemia and severe AS who was admitted to [facility] on 05/11/19 for exertional syncope.   Assessment & Plan    Syncope: felt to be related to her AS but also has significant underlying conduction disease. Has another episode of pre syncope this AM. EP has been consulted and does not feel that pre TAVR PPM  is indicated.   Severe AS: she has been evaluated by the multidisciplinary valve team and plans were made for TAVR on 05/16/19. Due to scheduling issues, this was moved to 10/1 AM. Will place inpatient TAVR orders now.  Conduction disease: has underlying RBBB, LAFB and 1st deg AV block. She is at extremely high risk for pacemaker after TAVR. As above, EP consulted. Will plan to keep temp wire in for at least 24 hours after TAVR.   HTN: BP is elevated. Home BP meds have been held given syncope on admission and critical AS.  Acute on chronic diastolic CHF: her lasix has been on hold. She has had worsening LE edema R>L. Will give her one dose of lasix 40mg  with Kdur 20 Meq now.   Recent GI bleed: Hg has remained stable. 10.4  this AM.   Vulvar cancer: current XRT therapy on hold. Appreciate the help of Dr. Denman George and Dr. Sondra Come. Plan to resume once discharged. Missed sessions will be added back at the end.   Hypothyroidism: TSH elevated at 9.841. Will make sure she follows up with PCP after discharge.   DMT2: being treated with SSI  Signed, Angelena Form, PA-C  05/17/2019, 10:03 AM  Pager [phone]  Patient seen, examined. Available data reviewed. Agree with findings, assessment, and plan as outlined by Nell Range, PA-C.  On my exam, this is an elderly woman in no distress.  She is lying in bed.  Lungs are clear, heart is regular rate and rhythm with a 3/6 harsh late peaking systolic murmur at the right upper sternal border, no diastolic murmur.  Abdomen is soft, thin, nontender.  Extremities with mild lower extremity edema.  Plans as outlined above.  The patient had a spell of lightheadedness this morning while she was up bathing.  I personally reviewed her telemetry and there were no bradycardic events or evidence of heart block.  We plan to move forward with transfemoral TAVR tomorrow.  Formal EP consultation appreciated.  The patient understands there is a high risk of heart block after  TAVR and will plan to leave her temporary pacing wire in place overnight following TAVR. All of her questions regarding TAVR are answered.   Sherren Mocha, M.D. 05/17/2019 11:23 AM

## 2019-05-17 NOTE — Progress Notes (Signed)
PROGRESS NOTE    Hailey Cobb  EGB:[id number] DOB: [birth year] DOA: 05/11/2019 PCP: Hulan Fess, MD    Brief Narrative:  85 year old lady with prior h/o chronic diastolic heart failure, DM, severe aortic valve stenosis, RBBB, tachybrady syndrome, hypertension, hyperlipidemia, hypothyroidism, subdural hematoma, vulvar cancer currently getting xrt presents to ED for syncope.  Cardiology consulted patient noted to have severe aortic stenosis.  Patient seen by EP and CT surgery and patient for planned TAVR on 05/18/2019.    Assessment & Plan:   Principal Problem:   Syncope Active Problems:   Diabetes mellitus ([place])   Hypothyroidism   Essential hypertension   Diabetes mellitus without complication (HCC)   Severe aortic stenosis   HLD (hyperlipidemia)   GERD (gastroesophageal reflux disease)   Primary vulvar squamous cell carcinoma (HCC)   Acute GI bleeding   Diarrhea  1 syncope likely secondary to severe critical aortic stenosis Patient presented with syncopal episode.  Patient noted to have critical aortic stenosis.  Head CT which was done was negative.  2D echo done showed a normal systolic function with a EF of 60 to 65%, mild concentric left ventricular hypertrophy, pseudonormalization of left ventricular diastolic Doppler parameters, no wall motion abnormalities, severely thickened aortic valve with severe stenosis of aortic valve which had progressed since prior study of 04/29/2018.  Patient seen in consultation by cardiology, CT surgery and EP and patient for transfemoral TAVR tomorrow.  2.  Conduction disease Patient noted to have underlying right bundle branch block, LAFB, first-degree AV block.  Patient noted to be extremely high risk for pacemaker post TAVR.  EP consulted and are following.  Per cardiology patient likely to have a temporary pacing wire post TAVR.  Magnesium sulfate 4 g IV x1 to keep magnesium greater than 2.  Potassium at 4.3.  Per cardiology.  3.   Hypothyroidism Continue current regimen Synthroid.  Repeat thyroid function studies in 4 to 6 weeks.  Outpatient follow-up with PCP.  4.  Well-controlled diabetes mellitus type 2 Hemoglobin A1c of 6.1.  CBG of 149 this morning.  Continue sliding scale insulin.  5.  Seizures Stable.  No seizure activity noted.  Continue home regimen Keppra.  6.  Hypertension Antihypertensive medications on hold secondary to problem #1.  Follow.  7.  Normocytic anemia/anemia of chronic disease H&H stable.  Transfusion threshold hemoglobin less than 7.  8.  Diarrhea Patient with soft stools.  9. GERD PPI   DVT prophylaxis: SCDs Code Status: Full Family Communication: Updated patient.  No family at bedside. Disposition Plan: To be determined   Consultants:   Cardiology: Dr. Delfina Redwood 05/11/2019  Cardiothoracic surgery: Dr. Alvan Dame 05/15/2019  She: Dr. Lovena Le 05/17/2019  Procedures:   CT head 05/11/2019  Chest x-ray 05/15/2019, 05/11/2019  Antimicrobials:   None   Subjective: Patient sitting at bedside.  Patient noted to have some soft stools.  Patient noted to have a presyncopal episode this morning while moving a chair.  Objective: Vitals:   05/16/19 2103 05/17/19 0055 05/17/19 0457 05/17/19 0850  BP: (!) 148/63 (!) 132/91 (!) 137/48 (!) 151/67  Pulse: (!) 59 70 69 64  Resp: 18 18 18 19   Temp: 98.1 F (36.7 C) 97.8 F (36.6 C) 97.8 F (36.6 C) 98.2 F (36.8 C)  TempSrc: Oral Oral Oral Axillary  SpO2: 100% 96% 95%   Weight:   84.6 kg   Height:        Intake/Output Summary (Last 24 hours) at 05/17/2019 1216 Last  data filed at 05/17/2019 0458 Gross per 24 hour  Intake 240 ml  Output 400 ml  Net -160 ml   Filed Weights   05/15/19 0516 05/16/19 0436 05/17/19 0457  Weight: 84.6 kg 85.1 kg 84.6 kg    Examination:  General exam: Appears calm and comfortable.  Respiratory system: Clear to auscultation. Respiratory effort normal. Cardiovascular system: RRR with harsh  systolic murmur radiating to the carotids.. No JVD, murmurs, rubs, gallops or clicks.  1-2+ bilateral lower extremity edema.   Gastrointestinal system: Abdomen is nondistended, soft and nontender. No organomegaly or masses felt. Normal bowel sounds heard. Central nervous system: Alert and oriented. No focal neurological deficits. Extremities: 1-2+ bilateral lower extremity edema.  Symmetric 5 x 5 power. Skin: No rashes, lesions or ulcers Psychiatry: Judgement and insight appear normal. Mood & affect appropriate.     Data Reviewed: I have personally reviewed following labs and imaging studies  CBC: Recent Labs  Lab 05/11/19 1806  05/12/19 0014 05/13/19 0312 05/15/19 0807 05/15/19 2010 05/17/19 1015  WBC 3.8*  --  2.9* 2.6* 2.7* 3.1* 2.4*  NEUTROABS 3.2  --   --   --   --   --  1.9  HGB 9.5*   < > 9.7* 9.4* 10.8* 10.4* 10.5*  HCT 30.3*   < > 30.3* 27.5* 33.5* 31.0* 30.6*  MCV 91.5  --  90.2 88.1 90.3 89.3 88.4  PLT 120*  --  128* 113* 150 147* 148*   < > = values in this interval not displayed.   Basic Metabolic Panel: Recent Labs  Lab 05/11/19 1739 05/11/19 1808 05/13/19 0312 05/15/19 0807 05/15/19 2010 05/17/19 0912  NA 130* 131* 131* 135 130* 132*  K 3.6 3.7 3.6 3.9 3.8 4.3  CL 94* 93* 98 97* 94* 97*  CO2 26  --  26 29 25 23   GLUCOSE 159* 155* 112* 148* 181* 220*  BUN 9 9 6* 7* 13 10  CREATININE 0.72 0.70 0.56 0.49 0.66 0.68  CALCIUM 9.1  --  8.9 9.5 9.8 9.5  MG  --   --   --   --   --  1.5*   GFR: Estimated Creatinine Clearance: 54.1 mL/min (by C-G formula based on SCr of 0.68 mg/dL). Liver Function Tests: Recent Labs  Lab 05/11/19 1739 05/15/19 2010  AST 21 22  ALT 16 19  ALKPHOS 74 78  BILITOT 0.3 0.1*  PROT 6.5 7.0  ALBUMIN 3.2* 3.4*   No results for input(s): LIPASE, AMYLASE in the last 168 hours. No results for input(s): AMMONIA in the last 168 hours. Coagulation Profile: Recent Labs  Lab 05/11/19 1806 05/15/19 2010  INR 1.0 1.0   Cardiac  Enzymes: No results for input(s): CKTOTAL, CKMB, CKMBINDEX, TROPONINI in the last 168 hours. BNP (last 3 results) No results for input(s): PROBNP in the last 8760 hours. HbA1C: Recent Labs    05/15/19 0807  HGBA1C 6.1*   CBG: Recent Labs  Lab 05/16/19 0736 05/16/19 1202 05/16/19 1709 05/16/19 2101 05/17/19 0759  GLUCAP 162* 134* 171* 186* 149*   Lipid Profile: No results for input(s): CHOL, HDL, LDLCALC, TRIG, CHOLHDL, LDLDIRECT in the last 72 hours. Thyroid Function Tests: Recent Labs    05/15/19 0807  TSH 9.841*   Anemia Panel: No results for input(s): VITAMINB12, FOLATE, FERRITIN, TIBC, IRON, RETICCTPCT in the last 72 hours. Sepsis Labs: No results for input(s): PROCALCITON, LATICACIDVEN in the last 168 hours.  Recent Results (from the past 240 hour(s))  SARS CORONAVIRUS 2 (TAT 6-24 HRS) Nasopharyngeal Nasopharyngeal Swab     Status: None   Collection Time: 05/11/19  7:14 PM   Specimen: Nasopharyngeal Swab  Result Value Ref Range Status   SARS Coronavirus 2 NEGATIVE NEGATIVE Final    Comment: (NOTE) SARS-CoV-2 target nucleic acids are NOT DETECTED. The SARS-CoV-2 RNA is generally detectable in upper and lower respiratory specimens during the acute phase of infection. Negative results do not preclude SARS-CoV-2 infection, do not rule out co-infections with other pathogens, and should not be used as the sole basis for treatment or other patient management decisions. Negative results must be combined with clinical observations, patient history, and epidemiological information. The expected result is Negative. Fact Sheet for Patients: Sugar[employer].be Fact Sheet for Healthcare Providers: https://www.woods-mathews.com/ This test is not yet approved or cleared by the [place] FDA and  has been authorized for detection and/or diagnosis of SARS-CoV-2 by FDA under an Emergency Use Authorization (EUA). This EUA will remain  in  effect (meaning this test can be used) for the duration of the COVID-19 declaration under Section 56 4(b)(1) of the Act, 21 U.S.C. section 360bbb-3(b)(1), unless the authorization is terminated or revoked sooner. Performed at [facility], [place] [address] [place], [place] [address]          Radiology Studies: Dg Chest 2 View  Result Date: 05/15/2019 CLINICAL DATA:  Preoperative examination EXAM: CHEST - 2 VIEW COMPARISON:  May 11, 2019 FINDINGS: The heart size and mediastinal contours are within normal limits. Both lungs are clear. The visualized skeletal structures are unremarkable. IMPRESSION: No active cardiopulmonary disease. Electronically Signed   By: Dorise Bullion III M.D   On: 05/15/2019 20:53        Scheduled Meds: . ALPRAZolam  0.5 mg Oral QHS  . Gerhardt's butt cream  1 application Topical BID  . insulin aspart  0-5 Units Subcutaneous QHS  . insulin aspart  0-9 Units Subcutaneous TID WC  . levETIRAcetam  500 mg Oral Daily  . levothyroxine  125 mcg Oral QAC breakfast  . pantoprazole  40 mg Oral Daily  . rosuvastatin  20 mg Oral Q M,W,F-2000   Continuous Infusions: . lactated ringers    . magnesium sulfate bolus IVPB       LOS: 5 days    Time spent: 35 mins    Irine Seal, MD [facility]  If 7PM-7AM, please contact night-coverage [email or web address] Password [email or web address] 05/17/2019, 12:16 PM

## 2019-05-18 ENCOUNTER — Encounter (HOSPITAL_COMMUNITY)
Admission: EM | Disposition: A | Discharge status: Home Health | Payer: Self-pay | Source: Home / Self Care | Attending: Internal Medicine

## 2019-05-18 ENCOUNTER — Ambulatory Visit: Payer: Medicare Other

## 2019-05-18 ENCOUNTER — Inpatient Hospital Stay (HOSPITAL_COMMUNITY): Payer: Medicare Other | Admitting: Certified Registered"

## 2019-05-18 ENCOUNTER — Encounter (HOSPITAL_COMMUNITY): Payer: Self-pay | Admitting: Physician Assistant

## 2019-05-18 ENCOUNTER — Inpatient Hospital Stay (HOSPITAL_COMMUNITY): Payer: Medicare Other

## 2019-05-18 DIAGNOSIS — I350 Nonrheumatic aortic (valve) stenosis: Secondary | ICD-10-CM

## 2019-05-18 DIAGNOSIS — Z006 Encounter for examination for normal comparison and control in clinical research program: Secondary | ICD-10-CM

## 2019-05-18 DIAGNOSIS — R778 Other specified abnormalities of plasma proteins: Secondary | ICD-10-CM

## 2019-05-18 DIAGNOSIS — Z952 Presence of prosthetic heart valve: Secondary | ICD-10-CM

## 2019-05-18 HISTORY — PX: TEE WITHOUT CARDIOVERSION: SHX5443

## 2019-05-18 HISTORY — DX: Presence of prosthetic heart valve: Z95.2

## 2019-05-18 HISTORY — PX: TRANSCATHETER AORTIC VALVE REPLACEMENT, TRANSFEMORAL: SHX6400

## 2019-05-18 LAB — CBC WITH DIFFERENTIAL/PLATELET
Abs Immature Granulocytes: 0.01 10*3/uL (ref 0.00–0.07)
Basophils Absolute: 0 10*3/uL (ref 0.0–0.1)
Basophils Relative: 1 %
Eosinophils Absolute: 0.1 10*3/uL (ref 0.0–0.5)
Eosinophils Relative: 4 %
HCT: 30.5 % — ABNORMAL LOW (ref 36.0–46.0)
Hemoglobin: 10 g/dL — ABNORMAL LOW (ref 12.0–15.0)
Immature Granulocytes: 0 %
Lymphocytes Relative: 9 %
Lymphs Abs: 0.3 10*3/uL — ABNORMAL LOW (ref 0.7–4.0)
MCH: 29.4 pg (ref 26.0–34.0)
MCHC: 32.8 g/dL (ref 30.0–36.0)
MCV: 89.7 fL (ref 80.0–100.0)
Monocytes Absolute: 0.6 10*3/uL (ref 0.1–1.0)
Monocytes Relative: 19 %
Neutro Abs: 2 10*3/uL (ref 1.7–7.7)
Neutrophils Relative %: 67 %
Platelets: 144 10*3/uL — ABNORMAL LOW (ref 150–400)
RBC: 3.4 MIL/uL — ABNORMAL LOW (ref 3.87–5.11)
RDW: 16.5 % — ABNORMAL HIGH (ref 11.5–15.5)
WBC: 2.9 10*3/uL — ABNORMAL LOW (ref 4.0–10.5)
nRBC: 0 % (ref 0.0–0.2)

## 2019-05-18 LAB — POCT I-STAT, CHEM 8
BUN: 7 mg/dL — ABNORMAL LOW (ref 8–23)
Calcium, Ion: 1.33 mmol/L (ref 1.15–1.40)
Chloride: 97 mmol/L — ABNORMAL LOW (ref 98–111)
Creatinine, Ser: 0.4 mg/dL — ABNORMAL LOW (ref 0.44–1.00)
Glucose, Bld: 159 mg/dL — ABNORMAL HIGH (ref 70–99)
HCT: 30 % — ABNORMAL LOW (ref 36.0–46.0)
Hemoglobin: 10.2 g/dL — ABNORMAL LOW (ref 12.0–15.0)
Potassium: 3.9 mmol/L (ref 3.5–5.1)
Sodium: 134 mmol/L — ABNORMAL LOW (ref 135–145)
TCO2: 27 mmol/L (ref 22–32)

## 2019-05-18 LAB — GLUCOSE, CAPILLARY
Glucose-Capillary: 148 mg/dL — ABNORMAL HIGH (ref 70–99)
Glucose-Capillary: 209 mg/dL — ABNORMAL HIGH (ref 70–99)
Glucose-Capillary: 98 mg/dL (ref 70–99)

## 2019-05-18 LAB — SURGICAL PCR SCREEN
MRSA, PCR: POSITIVE — AB
Staphylococcus aureus: POSITIVE — AB

## 2019-05-18 LAB — POCT I-STAT 4, (NA,K, GLUC, HGB,HCT)
Glucose, Bld: 204 mg/dL — ABNORMAL HIGH (ref 70–99)
Glucose, Bld: 218 mg/dL — ABNORMAL HIGH (ref 70–99)
HCT: 28 % — ABNORMAL LOW (ref 36.0–46.0)
HCT: 28 % — ABNORMAL LOW (ref 36.0–46.0)
Hemoglobin: 9.5 g/dL — ABNORMAL LOW (ref 12.0–15.0)
Hemoglobin: 9.5 g/dL — ABNORMAL LOW (ref 12.0–15.0)
Potassium: 3.9 mmol/L (ref 3.5–5.1)
Potassium: 3.7 mmol/L (ref 3.5–5.1)
Sodium: 135 mmol/L (ref 135–145)
Sodium: 130 mmol/L — ABNORMAL LOW (ref 135–145)

## 2019-05-18 LAB — MAGNESIUM: Magnesium: 1.9 mg/dL (ref 1.7–2.4)

## 2019-05-18 LAB — BASIC METABOLIC PANEL
Anion gap: 9 (ref 5–15)
BUN: 8 mg/dL (ref 8–23)
CO2: 25 mmol/L (ref 22–32)
Calcium: 9.3 mg/dL (ref 8.9–10.3)
Chloride: 98 mmol/L (ref 98–111)
Creatinine, Ser: 0.61 mg/dL (ref 0.44–1.00)
GFR calc Af Amer: >60 mL/min (ref >60)
GFR calc non Af Amer: >60 mL/min (ref >60)
Glucose, Bld: 151 mg/dL — ABNORMAL HIGH (ref 70–99)
Potassium: 3.9 mmol/L (ref 3.5–5.1)
Sodium: 132 mmol/L — ABNORMAL LOW (ref 135–145)

## 2019-05-18 LAB — ECHOCARDIOGRAM LIMITED
Height: 64 in
Weight: 2923.2 oz

## 2019-05-18 SURGERY — IMPLANTATION, AORTIC VALVE, TRANSCATHETER, FEMORAL APPROACH
Anesthesia: General | Site: Chest

## 2019-05-18 SURGERY — IMPLANTATION, AORTIC VALVE, TRANSCATHETER, FEMORAL APPROACH
Anesthesia: Monitor Anesthesia Care | Site: Esophagus

## 2019-05-18 MED ORDER — LIDOCAINE HCL 1 % IJ SOLN
INTRAMUSCULAR | Status: DC | PRN
  Administered 2019-05-18: 7 mL

## 2019-05-18 MED ORDER — PHENYLEPHRINE HCL-NACL 20-0.9 MG/250ML-% IV SOLN: 0.0000 ug/min | INTRAVENOUS | Status: DC

## 2019-05-18 MED ORDER — FENTANYL CITRATE (PF) 250 MCG/5ML IJ SOLN
INTRAMUSCULAR | Status: AC
  Filled 2019-05-18: qty 5

## 2019-05-18 MED ORDER — ONDANSETRON HCL 4 MG/2ML IJ SOLN
INTRAMUSCULAR | Status: AC
  Filled 2019-05-18: qty 4

## 2019-05-18 MED ORDER — MUPIROCIN 2 % EX OINT
1.0000 "application " | TOPICAL_OINTMENT | Freq: Two times a day (BID) | CUTANEOUS | Status: DC
  Administered 2019-05-18: 1 via NASAL
  Filled 2019-05-18: qty 22

## 2019-05-18 MED ORDER — OXYCODONE HCL 5 MG PO TABS
5.0000 mg | ORAL_TABLET | ORAL | Status: DC | PRN
  Administered 2019-05-18: 5 mg via ORAL
  Administered 2019-05-18 – 2019-05-20 (×2): 10 mg via ORAL
  Filled 2019-05-18 (×2): qty 2
  Filled 2019-05-18: qty 1
  Filled 2019-05-18: qty 2

## 2019-05-18 MED ORDER — SODIUM CHLORIDE 0.9 % IV SOLN
INTRAVENOUS | Status: DC | PRN
  Administered 2019-05-18 – 2019-05-19 (×2): via INTRAVENOUS

## 2019-05-18 MED ORDER — PROPOFOL 10 MG/ML IV BOLUS
INTRAVENOUS | Status: DC | PRN
  Administered 2019-05-18: 11 mg via INTRAVENOUS

## 2019-05-18 MED ORDER — MORPHINE SULFATE (PF) 2 MG/ML IV SOLN
1.0000 mg | INTRAVENOUS | Status: DC | PRN
  Administered 2019-05-19: 2 mg via INTRAVENOUS
  Filled 2019-05-18: qty 1

## 2019-05-18 MED ORDER — ASPIRIN 81 MG PO CHEW
81.0000 mg | CHEWABLE_TABLET | Freq: Every day | ORAL | Status: DC
  Administered 2019-05-19 – 2019-05-21 (×3): 81 mg via ORAL
  Filled 2019-05-18 (×3): qty 1

## 2019-05-18 MED ORDER — IODIXANOL 320 MG/ML IV SOLN
INTRAVENOUS | Status: DC | PRN
  Administered 2019-05-18: 11:00:00 60 mL via INTRAVENOUS

## 2019-05-18 MED ORDER — NITROGLYCERIN IN D5W 200-5 MCG/ML-% IV SOLN
0.0000 ug/min | INTRAVENOUS | Status: DC
  Administered 2019-05-18: 10 ug/min via INTRAVENOUS
  Administered 2019-05-19: 75 ug/min via INTRAVENOUS
  Filled 2019-05-18: qty 250

## 2019-05-18 MED ORDER — FENTANYL CITRATE (PF) 250 MCG/5ML IJ SOLN
INTRAMUSCULAR | Status: DC | PRN
  Administered 2019-05-18: 50 ug via INTRAVENOUS

## 2019-05-18 MED ORDER — FENTANYL CITRATE (PF) 100 MCG/2ML IJ SOLN
INTRAMUSCULAR | Status: DC | PRN
  Administered 2019-05-18 (×2): 50 ug via INTRAVENOUS

## 2019-05-18 MED ORDER — CLOPIDOGREL BISULFATE 75 MG PO TABS
75.0000 mg | ORAL_TABLET | Freq: Every day | ORAL | Status: DC
  Administered 2019-05-19 – 2019-05-21 (×3): 75 mg via ORAL
  Filled 2019-05-18 (×3): qty 1

## 2019-05-18 MED ORDER — CHLORHEXIDINE GLUCONATE CLOTH 2 % EX PADS
6.0000 | MEDICATED_PAD | Freq: Every day | CUTANEOUS | Status: DC
  Administered 2019-05-18 – 2019-05-21 (×4): 6 via TOPICAL

## 2019-05-18 MED ORDER — VANCOMYCIN HCL IN DEXTROSE 1-5 GM/200ML-% IV SOLN
1000.0000 mg | Freq: Once | INTRAVENOUS | Status: AC
  Administered 2019-05-18: 1000 mg via INTRAVENOUS
  Filled 2019-05-18: qty 200

## 2019-05-18 MED ORDER — ACETAMINOPHEN 650 MG RE SUPP: 650.0000 mg | Freq: Four times a day (QID) | RECTAL | Status: DC | PRN

## 2019-05-18 MED ORDER — SODIUM CHLORIDE 0.9 % IV SOLN: 250.0000 mL | INTRAVENOUS | Status: DC | PRN

## 2019-05-18 MED ORDER — HEPARIN SODIUM (PORCINE) 1000 UNIT/ML IJ SOLN
INTRAMUSCULAR | Status: DC | PRN
  Administered 2019-05-18: 12000 [IU] via INTRAVENOUS

## 2019-05-18 MED ORDER — LIDOCAINE HCL (PF) 1 % IJ SOLN
INTRAMUSCULAR | Status: AC
  Filled 2019-05-18: qty 30

## 2019-05-18 MED ORDER — SODIUM CHLORIDE 0.9 % IV SOLN
INTRAVENOUS | Status: AC
  Filled 2019-05-18: qty 1.2

## 2019-05-18 MED ORDER — SODIUM CHLORIDE 0.9% FLUSH
3.0000 mL | Freq: Two times a day (BID) | INTRAVENOUS | Status: DC
  Administered 2019-05-19 – 2019-05-21 (×5): 3 mL via INTRAVENOUS

## 2019-05-18 MED ORDER — SODIUM CHLORIDE 0.9 % IV SOLN
1.5000 g | Freq: Two times a day (BID) | INTRAVENOUS | Status: AC
  Administered 2019-05-18 – 2019-05-20 (×4): 1.5 g via INTRAVENOUS
  Filled 2019-05-18 (×4): qty 1.5

## 2019-05-18 MED ORDER — PROPOFOL 500 MG/50ML IV EMUL
INTRAVENOUS | Status: DC | PRN
  Administered 2019-05-18: 10 ug/kg/min via INTRAVENOUS

## 2019-05-18 MED ORDER — SODIUM CHLORIDE 0.9 % IV SOLN
INTRAVENOUS | Status: DC | PRN
  Administered 2019-05-18 (×3): 500 mL via INTRAMUSCULAR

## 2019-05-18 MED ORDER — ALPRAZOLAM 0.5 MG PO TABS
0.5000 mg | ORAL_TABLET | Freq: Once | ORAL | Status: AC
  Administered 2019-05-18: 0.5 mg via ORAL
  Filled 2019-05-18: qty 1

## 2019-05-18 MED ORDER — AZELASTINE HCL 0.1 % NA SOLN
2.0000 | Freq: Every day | NASAL | Status: DC
  Administered 2019-05-20: 2 via NASAL
  Filled 2019-05-18: qty 30

## 2019-05-18 MED ORDER — CHLORHEXIDINE GLUCONATE CLOTH 2 % EX PADS
6.0000 | MEDICATED_PAD | Freq: Every day | CUTANEOUS | Status: DC
  Administered 2019-05-18: 6 via TOPICAL

## 2019-05-18 MED ORDER — SODIUM CHLORIDE 0.9 % IV SOLN
INTRAVENOUS | Status: AC
  Administered 2019-05-18: 12:00:00 via INTRAVENOUS

## 2019-05-18 MED ORDER — NITROGLYCERIN IN D5W 200-5 MCG/ML-% IV SOLN
INTRAVENOUS | Status: DC | PRN
  Administered 2019-05-18: 25 ug/min via INTRAVENOUS

## 2019-05-18 MED ORDER — PROTAMINE SULFATE 10 MG/ML IV SOLN
INTRAVENOUS | Status: DC | PRN
  Administered 2019-05-18 (×2): 25 mg via INTRAVENOUS
  Administered 2019-05-18 (×2): 10 mg via INTRAVENOUS
  Administered 2019-05-18: 50 mg via INTRAVENOUS

## 2019-05-18 MED ORDER — FENTANYL CITRATE (PF) 100 MCG/2ML IJ SOLN
INTRAMUSCULAR | Status: AC
  Filled 2019-05-18: qty 2

## 2019-05-18 MED ORDER — HEPARIN SODIUM (PORCINE) 1000 UNIT/ML IJ SOLN
INTRAMUSCULAR | Status: AC
  Filled 2019-05-18: qty 1

## 2019-05-18 MED ORDER — TRAMADOL HCL 50 MG PO TABS: 50.0000 mg | ORAL_TABLET | ORAL | Status: DC | PRN

## 2019-05-18 MED ORDER — SODIUM CHLORIDE 0.9% FLUSH: 3.0000 mL | INTRAVENOUS | Status: DC | PRN

## 2019-05-18 MED ORDER — DEXMEDETOMIDINE HCL IN NACL 400 MCG/100ML IV SOLN
INTRAVENOUS | Status: DC | PRN
  Administered 2019-05-18: 1 ug/kg/h via INTRAVENOUS

## 2019-05-18 MED ORDER — ONDANSETRON HCL 4 MG/2ML IJ SOLN
INTRAMUSCULAR | Status: DC | PRN
  Administered 2019-05-18: 4 mg via INTRAVENOUS

## 2019-05-18 MED ORDER — DIPHENOXYLATE-ATROPINE 2.5-0.025 MG PO TABS: 1.0000 | ORAL_TABLET | Freq: Four times a day (QID) | ORAL | Status: DC | PRN

## 2019-05-18 MED ORDER — SODIUM CHLORIDE 0.9 % IV SOLN
INTRAVENOUS | Status: DC | PRN
  Administered 2019-05-18: 1.5 g via INTRAVENOUS

## 2019-05-18 MED ORDER — ACETAMINOPHEN 325 MG PO TABS
650.0000 mg | ORAL_TABLET | Freq: Four times a day (QID) | ORAL | Status: DC | PRN
  Filled 2019-05-18: qty 2

## 2019-05-18 MED ORDER — INSULIN ASPART 100 UNIT/ML ~~LOC~~ SOLN
0.0000 [IU] | Freq: Three times a day (TID) | SUBCUTANEOUS | Status: DC
  Administered 2019-05-18: 8 [IU] via SUBCUTANEOUS
  Administered 2019-05-18: 4 [IU] via SUBCUTANEOUS
  Administered 2019-05-19: 2 [IU] via SUBCUTANEOUS
  Administered 2019-05-19 – 2019-05-20 (×4): 4 [IU] via SUBCUTANEOUS
  Administered 2019-05-20: 2 [IU] via SUBCUTANEOUS
  Administered 2019-05-20: 4 [IU] via SUBCUTANEOUS
  Administered 2019-05-20 – 2019-05-21 (×2): 2 [IU] via SUBCUTANEOUS
  Administered 2019-05-21: 4 [IU] via SUBCUTANEOUS

## 2019-05-18 MED ORDER — LACTATED RINGERS IV SOLN
INTRAVENOUS | Status: DC | PRN
  Administered 2019-05-18 (×2): via INTRAVENOUS

## 2019-05-18 MED ORDER — ONDANSETRON HCL 4 MG/2ML IJ SOLN: 4.0000 mg | Freq: Four times a day (QID) | INTRAMUSCULAR | Status: DC | PRN

## 2019-05-18 MED ORDER — VANCOMYCIN HCL 1000 MG IV SOLR
INTRAVENOUS | Status: DC | PRN
  Administered 2019-05-18: 1500 mg via INTRAVENOUS

## 2019-05-18 MED ORDER — PROTAMINE SULFATE 10 MG/ML IV SOLN
INTRAVENOUS | Status: AC
  Filled 2019-05-18: qty 25

## 2019-05-18 SURGICAL SUPPLY — 88 items
BAG DECANTER FOR FLEXI CONT (MISCELLANEOUS) ×3 IMPLANT
BAG SNAP BAND KOVER 36X36 (MISCELLANEOUS) ×3 IMPLANT
BLADE CLIPPER SURG (BLADE) IMPLANT
BLADE STERNUM SYSTEM 6 (BLADE) IMPLANT
BLADE SURG 10 STRL SS (BLADE) IMPLANT
CABLE ADAPT CONN TEMP 6FT (ADAPTER) ×3 IMPLANT
CANISTER SUCT 3000ML PPV (MISCELLANEOUS) IMPLANT
CATH DIAG EXPO 6F AL1 (CATHETERS) IMPLANT
CATH DIAG EXPO 6F VENT PIG 145 (CATHETERS) ×6 IMPLANT
CATH EXTERNAL FEMALE PUREWICK (CATHETERS) IMPLANT
CATH INFINITI 6F AL2 (CATHETERS) ×3 IMPLANT
CATH S G BIP PACING (CATHETERS) ×3 IMPLANT
CHLORAPREP W/TINT 26 (MISCELLANEOUS) ×3 IMPLANT
CLIP VESOCCLUDE MED 24/CT (CLIP) IMPLANT
CLIP VESOCCLUDE SM WIDE 24/CT (CLIP) IMPLANT
CLOSURE MYNX CONTROL 6F/7F (Vascular Products) ×3 IMPLANT
CONT SPEC 4OZ CLIKSEAL STRL BL (MISCELLANEOUS) ×6 IMPLANT
COVER BACK TABLE 80X110 HD (DRAPES) ×3 IMPLANT
COVER WAND RF STERILE (DRAPES) ×3 IMPLANT
DECANTER SPIKE VIAL GLASS SM (MISCELLANEOUS) ×3 IMPLANT
DERMABOND ADVANCED (GAUZE/BANDAGES/DRESSINGS) ×1
DERMABOND ADVANCED .7 DNX12 (GAUZE/BANDAGES/DRESSINGS) ×2 IMPLANT
DEVICE CLOSURE PERCLS PRGLD 6F (VASCULAR PRODUCTS) ×4 IMPLANT
DRAPE INCISE IOBAN 66X45 STRL (DRAPES) IMPLANT
DRSG TEGADERM 4X4.75 (GAUZE/BANDAGES/DRESSINGS) ×3 IMPLANT
ELECT CAUTERY BLADE 6.4 (BLADE) IMPLANT
ELECT REM PT RETURN 9FT ADLT (ELECTROSURGICAL) ×6
ELECTRODE REM PT RTRN 9FT ADLT (ELECTROSURGICAL) ×4 IMPLANT
FELT TEFLON 6X6 (MISCELLANEOUS) ×3 IMPLANT
GAUZE 4X4 16PLY RFD (DISPOSABLE) ×3 IMPLANT
GAUZE SPONGE 4X4 12PLY STRL (GAUZE/BANDAGES/DRESSINGS) ×3 IMPLANT
GLOVE BIO SURGEON STRL SZ7.5 (GLOVE) IMPLANT
GLOVE BIO SURGEON STRL SZ8 (GLOVE) IMPLANT
GLOVE EUDERMIC 7 POWDERFREE (GLOVE) IMPLANT
GLOVE ORTHO TXT STRL SZ7.5 (GLOVE) IMPLANT
GOWN STRL REUS W/ TWL LRG LVL3 (GOWN DISPOSABLE) IMPLANT
GOWN STRL REUS W/ TWL XL LVL3 (GOWN DISPOSABLE) ×2 IMPLANT
GOWN STRL REUS W/TWL LRG LVL3 (GOWN DISPOSABLE)
GOWN STRL REUS W/TWL XL LVL3 (GOWN DISPOSABLE) ×1
GUIDEWIRE SAF TJ AMPL .035X180 (WIRE) ×3 IMPLANT
GUIDEWIRE SAFE TJ AMPLATZ EXST (WIRE) ×3 IMPLANT
INSERT FOGARTY SM (MISCELLANEOUS) IMPLANT
KIT BASIN OR (CUSTOM PROCEDURE TRAY) ×3 IMPLANT
KIT HEART LEFT (KITS) ×3 IMPLANT
KIT SUCTION CATH 14FR (SUCTIONS) IMPLANT
KIT TURNOVER KIT B (KITS) ×3 IMPLANT
LOOP VESSEL MAXI BLUE (MISCELLANEOUS) IMPLANT
LOOP VESSEL MINI RED (MISCELLANEOUS) IMPLANT
NS IRRIG 1000ML POUR BTL (IV SOLUTION) ×3 IMPLANT
PACK ENDO MINOR (CUSTOM PROCEDURE TRAY) ×3 IMPLANT
PAD ARMBOARD 7.5X6 YLW CONV (MISCELLANEOUS) ×6 IMPLANT
PAD ELECT DEFIB RADIOL ZOLL (MISCELLANEOUS) ×3 IMPLANT
PENCIL BUTTON HOLSTER BLD 10FT (ELECTRODE) IMPLANT
PERCLOSE PROGLIDE 6F (VASCULAR PRODUCTS) ×6
POSITIONER HEAD DONUT 9IN (MISCELLANEOUS) ×3 IMPLANT
SET MICROPUNCTURE 5F STIFF (MISCELLANEOUS) ×3 IMPLANT
SHEATH BRITE TIP 7FR 35CM (SHEATH) ×3 IMPLANT
SHEATH PINNACLE 6F 10CM (SHEATH) ×3 IMPLANT
SHEATH PINNACLE 8F 10CM (SHEATH) ×3 IMPLANT
SLEEVE REPOSITIONING LENGTH 30 (MISCELLANEOUS) ×3 IMPLANT
SPONGE LAP 18X18 RF (DISPOSABLE) ×3 IMPLANT
STOPCOCK MORSE 400PSI 3WAY (MISCELLANEOUS) ×6 IMPLANT
SUT ETHIBOND X763 2 0 SH 1 (SUTURE) IMPLANT
SUT GORETEX CV 4 TH 22 36 (SUTURE) IMPLANT
SUT GORETEX CV4 TH-18 (SUTURE) IMPLANT
SUT MNCRL AB 3-0 PS2 18 (SUTURE) IMPLANT
SUT PROLENE 5 0 C 1 36 (SUTURE) IMPLANT
SUT PROLENE 6 0 C 1 30 (SUTURE) IMPLANT
SUT SILK  1 MH (SUTURE) ×2
SUT SILK 1 MH (SUTURE) ×4 IMPLANT
SUT VIC AB 2-0 CT1 27 (SUTURE)
SUT VIC AB 2-0 CT1 TAPERPNT 27 (SUTURE) IMPLANT
SUT VIC AB 2-0 CTX 36 (SUTURE) IMPLANT
SUT VIC AB 3-0 SH 8-18 (SUTURE) IMPLANT
SYR 50ML LL SCALE MARK (SYRINGE) ×3 IMPLANT
SYR BULB IRRIGATION 50ML (SYRINGE) IMPLANT
SYR MEDRAD MARK V 150ML (SYRINGE) ×3 IMPLANT
TOWEL GREEN STERILE (TOWEL DISPOSABLE) ×6 IMPLANT
TRANSDUCER DISP STR W/STOPCOCK (MISCELLANEOUS) ×3 IMPLANT
TRANSDUCER W/STOPCOCK (MISCELLANEOUS) ×6 IMPLANT
TRAY FOLEY SLVR 14FR TEMP STAT (SET/KITS/TRAYS/PACK) IMPLANT
TUBE SUCT INTRACARD DLP 20F (MISCELLANEOUS) IMPLANT
VALVE 26 ULTRA SAPIEN KIT (Valve) ×3 IMPLANT
WIRE AMPLATZ SS-J .035X180CM (WIRE) ×3 IMPLANT
WIRE EMERALD 3MM-J .035X150CM (WIRE) ×3 IMPLANT
WIRE EMERALD 3MM-J .035X260CM (WIRE) ×3 IMPLANT
WIRE EMERALD ST .035X260CM (WIRE) ×3 IMPLANT
WIRE TORQFLEX AUST .018X40CM (WIRE) ×3 IMPLANT

## 2019-05-18 NOTE — Progress Notes (Signed)
Echocardiogram 2D Echocardiogram limited has been performed.  Darlina Sicilian M 05/18/2019, 10:40 AM

## 2019-05-18 NOTE — Transfer of Care (Signed)
Immediate Anesthesia Transfer of Care Note  Patient: Zellie L More  Procedure(s) Performed: TRANSCATHETER AORTIC VALVE REPLACEMENT, TRANSFEMORAL (N/A Chest) TRANSESOPHAGEAL ECHOCARDIOGRAM (TEE) (N/A Esophagus)  Patient Location: SICU  Anesthesia Type:MAC  Level of Consciousness: awake, alert  and oriented  Airway & Oxygen Therapy: Patient Spontanous Breathing, Patient connected to nasal cannula oxygen and Patient connected to face mask oxygen  Post-op Assessment: Report given to RN and Post -op Vital signs reviewed and stable  Post vital signs: Reviewed and stable  Last Vitals:  Vitals Value Taken Time  BP 147/60 05/18/19 1120  Temp    Pulse 51 05/18/19 1128  Resp 27 05/18/19 1128  SpO2 97 % 05/18/19 1128  Vitals shown include unvalidated device data.  Last Pain:  Vitals:   05/18/19 0415  TempSrc: Oral  PainSc:       Patients Stated Pain Goal: 0 (53/79/43 2761)  Complications: No apparent anesthesia complications

## 2019-05-18 NOTE — Anesthesia Procedure Notes (Signed)
Central Venous Catheter Insertion Performed by: Oleta Mouse, MD, anesthesiologist Start/End10/08/2018 6:48 AM, 05/18/2019 6:56 AM Patient location: Pre-op. Preanesthetic checklist: patient identified, IV checked, site marked, risks and benefits discussed, surgical consent, monitors and equipment checked, pre-op evaluation, timeout performed and anesthesia consent Lidocaine 1% used for infiltration and patient sedated Hand hygiene performed  and maximum sterile barriers used  Catheter size: 8 Fr Total catheter length 16. Central line was placed.Double lumen Procedure performed using ultrasound guided technique. Ultrasound Notes:image(s) printed for medical record Attempts: 1 Following insertion, dressing applied, line sutured and Biopatch. Post procedure assessment: blood return through all ports  Patient tolerated the procedure well with no immediate complications.

## 2019-05-18 NOTE — Progress Notes (Signed)
  [facility] VALVE TEAM  Patient doing well s/p TAVR. She is hemodynamically stable. Groin sites stable. ECG with sinus bradycardia w/ HR in 30s but no high grade block. Patient still very groggy from sedation. Plan to keep temp wire in. Will make NPO after midnight in the case that she needs PPM placement.   Angelena Form PA-C  [facility]  Pager [phone]

## 2019-05-18 NOTE — Progress Notes (Signed)
[facility] BRIEF SICU PROGRESS NOTE  Day of Surgery  S/P Procedure(s) (LRB): TRANSCATHETER AORTIC VALVE REPLACEMENT, TRANSFEMORAL (N/A) TRANSESOPHAGEAL ECHOCARDIOGRAM (TEE) (N/A)   Stable s/p TAVR HR 50's w/out need for pacing so far  Plan: Keep temporary pacer in place overnight  Rexene Alberts, MD 05/18/2019 7:11 PM

## 2019-05-18 NOTE — Anesthesia Preprocedure Evaluation (Signed)
Anesthesia Evaluation  Patient identified by MRN, date of birth, ID band Patient awake    Reviewed: Allergy & Precautions, NPO status , Patient's Chart, lab work & pertinent test results  History of Anesthesia Complications Negative for: history of anesthetic complications  Airway Mallampati: II  TM Distance: >3 FB Neck ROM: Full    Dental  (+) Dental Advisory Given, Missing, Partial Upper, Partial Lower   Pulmonary shortness of breath,    breath sounds clear to auscultation       Cardiovascular hypertension, Pt. on medications +CHF  + dysrhythmias + Valvular Problems/Murmurs AS  Rhythm:Irregular + Systolic murmurs    Neuro/Psych Seizures -, Well Controlled,  PSYCHIATRIC DISORDERS Anxiety    GI/Hepatic GERD  Medicated and Controlled,(+) Hepatitis -  Endo/Other  diabetesHypothyroidism Morbid obesity  Renal/GU      Musculoskeletal  (+) Arthritis ,   Abdominal   Peds  Hematology  (+) Blood dyscrasia, anemia ,   Anesthesia Other Findings   1. The left ventricle has normal systolic function with an ejection fraction of 60-65%. The cavity size was normal. There is mild concentric left ventricular hypertrophy. Left ventricular diastolic Doppler parameters are consistent with  pseudonormalization. Elevated mean left atrial pressure No evidence of left ventricular regional wall motion abnormalities.  2. The right ventricle has normal systolic function. The cavity was normal. There is no increase in right ventricular wall thickness. Right ventricular systolic pressure is mildly elevated with an estimated pressure of 45.4 mmHg.  3. Left atrial size was mildly dilated.  4. Mitral valve regurgitation is mild to moderate by color flow Doppler. The MR jet is centrally-directed.  5. The aortic valve is tricuspid. Severely thickening of the aortic valve. Moderate calcification of the aortic valve. Aortic valve regurgitation is mild by  color flow Doppler. Severe stenosis of the aortic valve.  6. When compared to the prior study: 04/29/2018, aortic stenosis has progressed and is now severe.  Reproductive/Obstetrics                             Anesthesia Physical Anesthesia Plan  ASA: IV  Anesthesia Plan: MAC   Post-op Pain Management:    Induction: Intravenous  PONV Risk Score and Plan: 2 and Ondansetron, Treatment may vary due to age or medical condition and Propofol infusion  Airway Management Planned: Nasal Cannula  Additional Equipment: None, Arterial line, CVP and Ultrasound Guidance Line Placement  Intra-op Plan:   Post-operative Plan:   Informed Consent: I have reviewed the patients History and Physical, chart, labs and discussed the procedure including the risks, benefits and alternatives for the proposed anesthesia with the patient or authorized representative who has indicated his/her understanding and acceptance.     Dental advisory given  Plan Discussed with: CRNA and Surgeon  Anesthesia Plan Comments:         Anesthesia Quick Evaluation

## 2019-05-18 NOTE — Op Note (Signed)
[facility]   MULTIDISCIPLINARY HEART VALVE TEAM   TAVR OPERATIVE NOTE   Date of Procedure:  05/18/2019  Preoperative Diagnosis: Severe Aortic Stenosis   Postoperative Diagnosis: Same   Procedure:    Transcatheter Aortic Valve Replacement - Percutaneous Right Transfemoral Approach  Edwards Sapien 3 Ultra THV (size 26 mm, model # 9750TFX, serial # [id number])   Co-Surgeons:  Hailey Gu. Hailey Manns, MD and Hailey Mocha, MD  Anesthesiologist:  Hailey Panda, MD  Echocardiographer:  Hailey Klein, MD  Pre-operative Echo Findings:  Severe aortic stenosis  Normal left ventricular systolic function  Post-operative Echo Findings:  No paravalvular leak  Normal left ventricular systolic function   BRIEF CLINICAL NOTE AND INDICATIONS FOR SURGERY  Hailey Cobb is a 85 y.o. 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  Hailey Cobb lives with her husband in [place], [place].  Her husband is quite ill with multiple medical problems and dementia and requires a lot of caregiving assistance.  She has a caregiver who comes to help her every morning.  She had 3 children but only one living daughter, Hailey Cobb.  She is very supportive and lives 5 minutes away. Hailey Cobb still drives and takes care of most of her ADLs, including cooking and cleaning as well as taking care of her ill husband.  She wears partial dentures and sees a dentist regularly.  She had a traumatic subdural hematoma in 06/2014 with subsequent seizures.  She has been on Keppra since that time with no recurrence of seizures.  She was admitted  in 01/2017 for symptomatic bradycardia. Beta blocker was discontinued. Heart monitor obtained on 03/01/2017 showed sinus rhythm with PVCs and PACs, brief SVT with heart rate of 150,no pause or excessive bradycardia.Repeat 48-hour monitor obtained on 03/24/2018 showed frequent PVCs with occasional bigeminy and trigeminy, 16% overall PVC burden, occasional SVT up to 11 beats, no evidence of A. fib, no sustained tachybradycardiaarrhythmia.  Hailey Cobb saw her in consultation for PVCs in 12/2028 during an admission for abdominal pain. He felt like her PVCs were longstanding and recommended she avoid AV nodal blocking agents given underling conduction abnormalities with 1st deg AVB and RBBB.   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 She was referred to [facility] where she was to undergo surgical resection, but it was cancelled last minute by anesthesia due to concerns about her multifocal ventricular ectopy and known aortic stenosis. Treatment plan was changed to radiation therapy which has been ongoing. PET scan 03/14/19 did not show any hypermetabolic pelvic lymph nodes or evidence of distance metastatic disease.   Repeat echo in 02/2019 showed EF 60-65%, mild-mod MR, severe aortic stenosis with mean/peak gradient of 50.8/81.6 mm Hg, AVA 0.46, DVI 0.17 and mild AI. Hailey Cobb set up [facility] on 04/06/19 which showed mild non obstructive CAD with critical AS (AVA 0.77cm2 and mean/peak gradient 66/80 mmHg).  She was referred to Hailey Cobb with the multidisciplinary valve team for consideration of TAVR on 04/14/2019.  Plans were made to complete the work-up for TAVR and schedule surgery after completion of her XRT therapy in mid  October.  However, she was then admitted to [facility] from 9/15-9/21/20 for weakness and shortness of breath. She was found to have acute blood loss anemia with Hg down to 7.7. FOBT was positive. She was  transfused 1U PRBCs. GI recommended upper GI series due to high risk for endoscopic procedures with anesthesia.  She underwent UGI series on 9/18 which showed no significant findings except for mild GERD. Patient was treated with IV Protonix. She was hesitant to have barium enema as she reported discomfort in her perineal area from radiation, so this was canceled.   After discharge she remained very weak and had continued dyspnea. On 05/11/19 she had a syncopal episode at home with a prior period of apnea and CPR was performed by her husband's caregiver (no pulse was checked prior to initiation).  This occurred in the setting of walking to the front door to let the physical therapist in.  She reports walking to the front door, putting her hand on the doorknob and waking up to EMS surrounding her. Per EMS her systolic blood pressure was in the 80s and she was given 400 mL of normal saline in route. She denies any prodromal syndromes or chest pain.  She was brought to [facility] for further evaluation and work-up.   In the [facility], ED her blood pressure was 130/40.  Hemoglobin was 9.5.  Platelets 120,000, blood glucose 159, sodium 130 (has chronic mild hyponatremia), BNP 145, high-sensitivity troponin 29, chest x-ray with no active disease.  COVID-19 negative.  CT head negative. Of note, she has had some bradycardia noticed on telemetry with sinus bradycardia in the high 40s, occasional sinus pauses with a junctional escape.  Twelve-lead EKG shows sinus with first-degree AV block, bifascicular block with a RBBB and LAFB.  The structural heart team was alerted of her admission and tentative plans were made for TAVR on 9/29. Her oncology team was contacted who cleared her to hold off on current XRT sessions for TAVR. The misses sessions will be completed on the back end of treatment schedule.   Cardiothoracic surgery is consulted for surgical consultation to discuss treatment options for  management of severe symptomatic aortic stenosis.  During the course of the patient's preoperative work up they have been evaluated comprehensively by a multidisciplinary team of specialists coordinated through the [facility] in the [facility].  They have been demonstrated to suffer from symptomatic severe aortic stenosis as noted above. The patient has been counseled extensively as to the relative risks and benefits of all options for the treatment of severe aortic stenosis including long term medical therapy, conventional surgery for aortic valve replacement, and transcatheter aortic valve replacement.  All questions have been answered, and the patient provides full informed consent for the operation as described.   DETAILS OF THE OPERATIVE PROCEDURE  PREPARATION:    The patient is brought to the operating room on the above mentioned date and appropriate monitoring was established by the anesthesia team. The patient is placed in the supine position on the operating table.  Intravenous antibiotics are administered. The patient is monitored closely throughout the procedure under conscious sedation.  Baseline transthoracic echocardiogram was performed. The patient's chest, abdomen, both groins, and both lower extremities are prepared and draped in a sterile manner. A time out procedure is performed.   PERIPHERAL ACCESS:    Using the modified Seldinger technique, femoral arterial and venous access was obtained with placement of  6 Fr sheaths on the left side.  A pigtail diagnostic catheter was passed through the left arterial sheath under fluoroscopic guidance into the aortic root.  A temporary transvenous pacemaker catheter was passed through the left femoral venous sheath under fluoroscopic guidance into the right ventricle.  The pacemaker was tested to ensure stable lead placement and pacemaker capture. Aortic root angiography was performed in  order to determine the optimal angiographic angle for valve deployment.   TRANSFEMORAL ACCESS:   Percutaneous transfemoral access and sheath placement was performed using ultrasound guidance.  The right common femoral artery was cannulated using a micropuncture needle and appropriate location was verified using hand injection angiogram.  A pair of Abbott Perclose percutaneous closure devices were placed and a 6 French sheath replaced into the femoral artery.  The patient was heparinized systemically and ACT verified > 250 seconds.    A 14 Fr transfemoral E-sheath was introduced into the right common femoral artery after progressively dilating over an Amplatz superstiff wire. An AL-2 catheter was used to direct a straight-tip exchange length wire across the native aortic valve into the left ventricle. This was exchanged out for a pigtail catheter and position was confirmed in the LV apex. Simultaneous LV and Ao pressures were recorded.  The pigtail catheter was exchanged for an Amplatz Extra-stiff wire in the LV apex.  Echocardiography was utilized to confirm appropriate wire position and no sign of entanglement in the mitral subvalvular apparatus.   TRANSCATHETER HEART VALVE DEPLOYMENT:   An Edwards Sapien 3 Ultra transcatheter heart valve (size 26 mm, model #9750TFX, serial #[id number]) was prepared and crimped per manufacturer's guidelines, and the proper orientation of the valve is confirmed on the [employer] delivery system. The valve was advanced through the introducer sheath using normal technique until in an appropriate position in the abdominal aorta beyond the sheath tip. The balloon was then retracted and using the fine-tuning wheel was centered on the valve. The valve was then advanced across the aortic arch using appropriate flexion of the catheter. The valve was carefully positioned across the aortic valve annulus. The Commander catheter was retracted using normal technique. Once final  position of the valve has been confirmed by angiographic assessment, the valve is deployed while temporarily holding ventilation and during rapid ventricular pacing to maintain systolic blood pressure < 50 mmHg and pulse pressure < 10 mmHg. The balloon inflation is held for >3 seconds after reaching full deployment volume. Once the balloon has fully deflated the balloon is retracted into the ascending aorta and valve function is assessed using echocardiography. There is felt to be no paravalvular leak and no central aortic insufficiency.  The patient's hemodynamic recovery following valve deployment is good.  The deployment balloon and guidewire are both removed.    PROCEDURE COMPLETION:   The sheath was removed and femoral artery closure performed.  Protamine was administered once femoral arterial repair was complete. The temporary pacing wire was secured in place.  The pigtail catheter and femoral arterial sheath was removed with manual pressure used for hemostasis.  A Mynx femoral closure device was utilized following removal of the diagnostic sheath in the left femoral artery.  The patient tolerated the procedure well and is transported to the surgical intensive care in stable condition. There were no immediate intraoperative complications. All sponge instrument and needle counts are verified correct at completion of the operation.   No blood products were administered during the operation.  The patient received a total of 60 mL of  intravenous contrast during the procedure.   Rexene Alberts, MD 05/18/2019 10:48 AM

## 2019-05-18 NOTE — Progress Notes (Signed)
PROGRESS NOTE    Hailey Cobb  WGY:[id number] DOB: 06-Apr-1932 DOA: 05/11/2019 PCP: Hulan Fess, MD    Brief Narrative:  85 year old lady with prior h/o chronic diastolic heart failure, DM, severe aortic valve stenosis, RBBB, tachybrady syndrome, hypertension, hyperlipidemia, hypothyroidism, subdural hematoma, vulvar cancer currently getting xrt presents to ED for syncope.  Cardiology consulted patient noted to have severe aortic stenosis.  Patient seen by EP and CT surgery and patient subsequently underwent TAVR on 05/18/2019.    Assessment & Plan:   Principal Problem:   S/P TAVR (transcatheter aortic valve replacement) Active Problems:   Diabetes mellitus (HCC)   Hypothyroidism   Essential hypertension   Diabetes mellitus without complication (HCC)   Aortic valve stenosis   HLD (hyperlipidemia)   GERD (gastroesophageal reflux disease)   Elevated troponin   Primary vulvar squamous cell carcinoma (HCC)   Acute GI bleeding   Syncope   Diarrhea  1 syncope likely secondary to severe critical aortic stenosis status post TAVR 05/18/2019 Patient presented with syncopal episode.  Patient noted to have critical aortic stenosis.  Head CT which was done was negative.  2D echo done showed a normal systolic function with a EF of 60 to 65%, mild concentric left ventricular hypertrophy, pseudonormalization of left ventricular diastolic Doppler parameters, no wall motion abnormalities, severely thickened aortic valve with severe stenosis of aortic valve which had progressed since prior study of 04/29/2018.  Patient seen in consultation by cardiology, CT surgery and EP and patient status post transfemoral TAVR today 05/18/2019.  Per cardiology.   2.  Conduction disease Patient noted to have underlying right bundle branch block, LAFB, first-degree AV block.  Patient noted to be extremely high risk for pacemaker post TAVR.  EP consulted and are following.  Per cardiology patient likely to have a  temporary pacing wire post TAVR.  Patient status post TAVR today 05/18/2019.  Keep magnesium greater than 2.  Keep potassium greater than 4.  Per cardiology.  3.  Hypothyroidism Continue current regimen Synthroid.  Repeat thyroid function studies in 4 to 6 weeks.  Outpatient follow-up with PCP.  4.  Well-controlled diabetes mellitus type 2 Hemoglobin A1c of 6.1.  CBG of 148 this morning.  Continue sliding scale insulin.  5.  Seizures No seizure activity noted.  Continue current regimen of Keppra.   6.  Hypertension Antihypertensive medications on hold secondary to problem #1.  Follow.  7.  Normocytic anemia/anemia of chronic disease H&H stable.  Transfusion threshold hemoglobin less than 7.  8.  Diarrhea Patient with soft stools early on during the hospitalization however denies any further stools today.  9. GERD Continue PPI   DVT prophylaxis: SCDs Code Status: Full Family Communication: Updated patient.  No family at bedside. Disposition Plan: Transfer to cardiology service as they will assume care postoperatively.    Consultants:   Cardiology: Dr. Delfina Redwood 05/11/2019  Cardiothoracic surgery: Dr. Alvan Dame 05/15/2019  EP: Dr. Lovena Le 05/17/2019  Procedures:   CT head 05/11/2019  Chest x-ray 05/15/2019, 05/11/2019  Transcatheter aortic valve replacement per Dr. Burt Knack, Dr. Roxy Manns 05/18/2019  Antimicrobials:   None   Subjective: Patient postop.  Drowsy however easily arousable following commands and answering questions appropriately.  Denies any further presyncopal episodes since yesterday.  Denies any shortness of breath.  No bowel movement today.   Objective: Vitals:   05/17/19 1241 05/17/19 1619 05/17/19 1950 05/18/19 0415  BP: (!) 156/92 (!) 157/59 140/67 (!) 147/67  Pulse:  65 (!) 57 Marland Kitchen)  57  Resp: 18 19 18 18   Temp: (!) 97.5 F (36.4 C) 97.6 F (36.4 C) (!) 97.5 F (36.4 C) 97.8 F (36.6 C)  TempSrc:  Oral Oral Oral  SpO2: 96% 97% 97% 97%  Weight:     82.9 kg  Height:        Intake/Output Summary (Last 24 hours) at 05/18/2019 1155 Last data filed at 05/18/2019 1107 Gross per 24 hour  Intake 1100 ml  Output 25 ml  Net 1075 ml   Filed Weights   05/16/19 0436 05/17/19 0457 05/18/19 0415  Weight: 85.1 kg 84.6 kg 82.9 kg    Examination:  General exam: Drowsy.  Postop. Respiratory system: Clear to auscultation anterior lung fields. Respiratory effort normal. Cardiovascular system: RRR with harsh systolic murmur radiating to the carotids.. No JVD, murmurs, rubs, gallops or clicks.  Trace bilateral lower extremity edema.   Gastrointestinal system: Abdomen is nondistended, soft and nontender. No organomegaly or masses felt. Normal bowel sounds heard. Central nervous system: Alert and oriented. No focal neurological deficits. Extremities: Trace bilateral lower extremity edema.  Symmetric 5 x 5 power. Skin: No rashes, lesions or ulcers Psychiatry: Judgement and insight appear normal. Mood & affect appropriate.     Data Reviewed: I have personally reviewed following labs and imaging studies  CBC: Recent Labs  Lab 05/11/19 1806  05/13/19 0312 05/15/19 0807 05/15/19 2010 05/17/19 1015 05/18/19 0507 05/18/19 0928 05/18/19 1015  WBC 3.8*   < > 2.6* 2.7* 3.1* 2.4* 2.9*  --   --   NEUTROABS 3.2  --   --   --   --  1.9 2.0  --   --   HGB 9.5*   < > 9.4* 10.8* 10.4* 10.5* 10.0* 10.2* 9.5*  HCT 30.3*   < > 27.5* 33.5* 31.0* 30.6* 30.5* 30.0* 28.0*  MCV 91.5   < > 88.1 90.3 89.3 88.4 89.7  --   --   PLT 120*   < > 113* 150 147* 148* 144*  --   --    < > = values in this interval not displayed.   Basic Metabolic Panel: Recent Labs  Lab 05/13/19 0312 05/15/19 0807 05/15/19 2010 05/17/19 0912 05/18/19 0507 05/18/19 0928 05/18/19 1015  NA 131* 135 130* 132* 132* 134* 135  K 3.6 3.9 3.8 4.3 3.9 3.9 3.9  CL 98 97* 94* 97* 98 97*  --   CO2 26 29 25 23 25   --   --   GLUCOSE 112* 148* 181* 220* 151* 159* 204*  BUN 6* 7* 13 10 8  7*   --   CREATININE 0.56 0.49 0.66 0.68 0.61 0.40*  --   CALCIUM 8.9 9.5 9.8 9.5 9.3  --   --   MG  --   --   --  1.5* 1.9  --   --    GFR: Estimated Creatinine Clearance: 53.6 mL/min (A) (by C-G formula based on SCr of 0.4 mg/dL (L)). Liver Function Tests: Recent Labs  Lab 05/11/19 1739 05/15/19 2010  AST 21 22  ALT 16 19  ALKPHOS 74 78  BILITOT 0.3 0.1*  PROT 6.5 7.0  ALBUMIN 3.2* 3.4*   No results for input(s): LIPASE, AMYLASE in the last 168 hours. No results for input(s): AMMONIA in the last 168 hours. Coagulation Profile: Recent Labs  Lab 05/11/19 1806 05/15/19 2010  INR 1.0 1.0   Cardiac Enzymes: No results for input(s): CKTOTAL, CKMB, CKMBINDEX, TROPONINI in the last 168 hours. BNP (  last 3 results) No results for input(s): PROBNP in the last 8760 hours. HbA1C: No results for input(s): HGBA1C in the last 72 hours. CBG: Recent Labs  Lab 05/17/19 0759 05/17/19 1221 05/17/19 1636 05/17/19 2106 05/18/19 0441  GLUCAP 149* 149* 204* 109* 148*   Lipid Profile: No results for input(s): CHOL, HDL, LDLCALC, TRIG, CHOLHDL, LDLDIRECT in the last 72 hours. Thyroid Function Tests: No results for input(s): TSH, T4TOTAL, FREET4, T3FREE, THYROIDAB in the last 72 hours. Anemia Panel: No results for input(s): VITAMINB12, FOLATE, FERRITIN, TIBC, IRON, RETICCTPCT in the last 72 hours. Sepsis Labs: No results for input(s): PROCALCITON, LATICACIDVEN in the last 168 hours.  Recent Results (from the past 240 hour(s))  SARS CORONAVIRUS 2 (TAT 6-24 HRS) Nasopharyngeal Nasopharyngeal Swab     Status: None   Collection Time: 05/11/19  7:14 PM   Specimen: Nasopharyngeal Swab  Result Value Ref Range Status   SARS Coronavirus 2 NEGATIVE NEGATIVE Final    Comment: (NOTE) SARS-CoV-2 target nucleic acids are NOT DETECTED. The SARS-CoV-2 RNA is generally detectable in upper and lower respiratory specimens during the acute phase of infection. Negative results do not preclude SARS-CoV-2  infection, do not rule out co-infections with other pathogens, and should not be used as the sole basis for treatment or other patient management decisions. Negative results must be combined with clinical observations, patient history, and epidemiological information. The expected result is Negative. Fact Sheet for Patients: SugarRoll.be Fact Sheet for Healthcare Providers: https://www.woods-mathews.com/ This test is not yet approved or cleared by the [place] FDA and  has been authorized for detection and/or diagnosis of SARS-CoV-2 by FDA under an Emergency Use Authorization (EUA). This EUA will remain  in effect (meaning this test can be used) for the duration of the COVID-19 declaration under Section 56 4(b)(1) of the Act, 21 U.S.C. section 360bbb-3(b)(1), unless the authorization is terminated or revoked sooner. Performed at [facility], [place] [address] [place], [place] [address]   Surgical pcr screen     Status: Abnormal   Collection Time: 05/17/19 10:19 PM   Specimen: Nasal Mucosa; Nasal Swab  Result Value Ref Range Status   MRSA, PCR POSITIVE (A) NEGATIVE Final    Comment: RESULT CALLED TO, READ BACK BY AND VERIFIED WITH: Ulice Brilliant RN 05/18/2019 AT 0054 SKEEN,P    Staphylococcus aureus POSITIVE (A) NEGATIVE Final    Comment: (NOTE) The Xpert SA Assay (FDA approved for NASAL specimens in patients 63 years of age and older), is one component of a comprehensive surveillance program. It is not intended to diagnose infection nor to guide or monitor treatment. Performed at [facility], [place] [address] [place], [place] [address]          Radiology Studies: No results found.      Scheduled Meds: . aspirin  81 mg Oral Daily  . Azelastine HCl  2 spray Each Nare QHS  . [START ON 05/19/2019] clopidogrel  75 mg Oral Q breakfast  . insulin aspart  0-24 Units Subcutaneous TID AC & HS  . sodium chloride flush  3  mL Intravenous Q12H   Continuous Infusions: . sodium chloride    . sodium chloride    . cefUROXime (ZINACEF)  IV    . nitroGLYCERIN    . phenylephrine (NEO-SYNEPHRINE) Adult infusion    . vancomycin       LOS: 6 days    Time spent: 35 mins    Irine Seal, MD [facility]  If  7PM-7AM, please contact night-coverage [email or web address] Password [email or web address] 05/18/2019, 11:55 AM

## 2019-05-18 NOTE — TOC Progression Note (Signed)
Transition of Care [facility]) - Progression Note    Patient Details  Name: Hailey Cobb MRN: [id number] Date of Birth: [birth year]  Transition of Care [facility]) CM/SW [place] RN, BSN, NCM-BC, [place] [phone] Phone Number: 05/18/2019, 2:56 PM  Clinical Narrative:    CM continue to follow for dispositional needs. Patient is s/p TAVR; active with [facility] for RN, PT, OT services. Patient will need resumption orders for [facility] services prior to discharge. CM will continue to monitor.    Expected Discharge Plan: [place] Barriers to Discharge: Continued Medical Work up  Expected Discharge Plan and Services Expected Discharge Plan: [place] In-house Referral: NA Discharge Planning Services: CM Consult Post Acute Care Choice: [place] arrangements for the past 2 months: Single Family Home                   Social Determinants of Health (SDOH) Interventions    Readmission Risk Interventions No flowsheet data found.

## 2019-05-18 NOTE — OR Nursing (Signed)
In consult with IP, enteric precautions removed due to negative stool sample on 9/25.  Removed flag from record.

## 2019-05-18 NOTE — Anesthesia Procedure Notes (Signed)
Procedure Name: MAC Date/Time: 05/18/2019 9:12 AM Performed by: Mariea Clonts, CRNA Pre-anesthesia Checklist: Patient identified, Emergency Drugs available, Suction available, Timeout performed and Patient being monitored Patient Re-evaluated:Patient Re-evaluated prior to induction Oxygen Delivery Method: Simple face mask and Nasal cannula

## 2019-05-18 NOTE — Progress Notes (Signed)
      [address]       [place] [address]             [phone]     CARDIOTHORACIC SURGERY PROGRESS NOTE  Subjective: Hailey Cobb has been scheduled for Procedure(s): TRANSCATHETER AORTIC VALVE REPLACEMENT, TRANSFEMORAL (N/A) TRANSESOPHAGEAL ECHOCARDIOGRAM (TEE) (N/A) today.   Objective: Vital signs in last 24 hours: Temp:  [97.5 F (36.4 C)-98.2 F (36.8 C)] 97.8 F (36.6 C) (10/01 0415) Pulse Rate:  [57-65] 57 (10/01 0415) Cardiac Rhythm: Normal sinus rhythm (09/30 2000) Resp:  [18-19] 18 (10/01 0415) BP: (140-157)/(59-92) 147/67 (10/01 0415) SpO2:  [96 %-97 %] 97 % (10/01 0415) Weight:  [82.9 kg] 82.9 kg (10/01 0415)  Physical Exam: Unchanged from previously   Intake/Output from previous day: 09/30 0701 - 10/01 0700 In: 100 [IV Piggyback:100] Out: -  Intake/Output this shift: No intake/output data recorded.  Lab Results: Recent Labs    05/17/19 1015 05/18/19 0507  WBC 2.4* 2.9*  HGB 10.5* 10.0*  HCT 30.6* 30.5*  PLT 148* 144*   BMET:  Recent Labs    05/17/19 0912 05/18/19 0507  NA 132* 132*  K 4.3 3.9  CL 97* 98  CO2 23 25  GLUCOSE 220* 151*  BUN 10 8  CREATININE 0.68 0.61  CALCIUM 9.5 9.3    CBG (last 3)  Recent Labs    05/17/19 1636 05/17/19 2106 05/18/19 0441  GLUCAP 204* 109* 148*   PT/INR:   Recent Labs    05/15/19 2010  LABPROT 13.4  INR 1.0    Assessment/Plan:   The various methods of treatment have been discussed with the patient. After consideration of the risks, benefits and treatment options the patient has consented to the planned procedure.   The patient has been seen and labs reviewed. There are no changes in the patient's condition to prevent proceeding with the planned procedure today.   Rexene Alberts, MD 05/18/2019 6:05 AM

## 2019-05-18 NOTE — Op Note (Signed)
[facility]   MULTIDISCIPLINARY HEART VALVE TEAM   TAVR OPERATIVE NOTE   Date of Procedure:  05/18/2019  Preoperative Diagnosis: Severe Aortic Stenosis   Postoperative Diagnosis: Same   Procedure:    Transcatheter Aortic Valve Replacement - Percutaneous Transfemoral Approach  Edwards Sapien 3 Ultra THV (size 26 mm, model # 9750TFX, serial #[id number])   Co-Surgeons:  Valentina Gu. Roxy Manns, MD and Sherren Mocha, MD  Anesthesiologist:  Laurie Panda, MD  Echocardiographer:  Sanda Klein, MD  Pre-operative Echo Findings:  Severe aortic stenosis  Normal left ventricular systolic function  Post-operative Echo Findings:  No paravalvular leak  Normal/unchanged left ventricular systolic function  BRIEF CLINICAL NOTE AND INDICATIONS FOR SURGERY  85 year old woman with progressive, severe aortic stenosis and a variety of comorbid medical conditions, who presents with near syncope.  She is found to have significant conduction disease with first-degree AV block, right bundle branch block, and left anterior fascicular block.  She has very severe aortic stenosis with a mean gradient greater than 50 mmHg.  She has multiple comorbid medical conditions including vulvar cancer undergoing XRT, autoimmune hepatitis, type 2 diabetes, hypertension, and chronic diastolic heart failure.  She has undergone multidisciplinary heart valve team evaluation and will be treated with transcatheter aortic valve replacement.  During the course of the patient's preoperative work up they have been evaluated comprehensively by a multidisciplinary team of specialists coordinated through the [facility] in the [facility].  They have been demonstrated to suffer from symptomatic severe aortic stenosis as noted above. The patient has been counseled extensively as to the relative risks and benefits of all options for the treatment of severe aortic stenosis  including long term medical therapy, conventional surgery for aortic valve replacement, and transcatheter aortic valve replacement.  The patient has been independently evaluated in formal cardiac surgical consultation by Dr Roxy Manns, who deemed the patient appropriate for TAVR. Based upon review of all of the patient's preoperative diagnostic tests they are felt to be candidate for transcatheter aortic valve replacement using the transfemoral approach as an alternative to conventional surgery.    Following the decision to proceed with transcatheter aortic valve replacement, a discussion has been held regarding what types of management strategies would be attempted intraoperatively in the event of life-threatening complications, including whether or not the patient would be considered a candidate for the use of cardiopulmonary bypass and/or conversion to open sternotomy for attempted surgical intervention.  The patient has been advised of a variety of complications that might develop peculiar to this approach including but not limited to risks of death, stroke, paravalvular leak, aortic dissection or other major vascular complications, aortic annulus rupture, device embolization, cardiac rupture or perforation, acute myocardial infarction, arrhythmia, heart block or bradycardia requiring permanent pacemaker placement, congestive heart failure, respiratory failure, renal failure, pneumonia, infection, other late complications related to structural valve deterioration or migration, or other complications that might ultimately cause a temporary or permanent loss of functional independence or other long term morbidity.  The patient provides full informed consent for the procedure as described and all questions were answered preoperatively.  DETAILS OF THE OPERATIVE PROCEDURE  PREPARATION:   The patient is brought to the operating room on the above mentioned date and central monitoring was established by the anesthesia  team including placement of a central venous catheter and radial arterial line. The patient is placed in the supine position on the operating table.  Intravenous antibiotics are administered. The  patient is monitored closely throughout the procedure under conscious sedation.   Baseline transthoracic echocardiogram is performed. The patient's chest, abdomen, both groins, and both lower extremities are prepared and draped in a sterile manner. A time out procedure is performed.   PERIPHERAL ACCESS:   Using ultrasound guidance, femoral arterial and venous access is obtained with placement of 6 Fr sheaths on the left side.  A pigtail diagnostic catheter was passed through the femoral arterial sheath under fluoroscopic guidance into the aortic root.  A temporary transvenous pacemaker catheter was passed through the femoral venous sheath under fluoroscopic guidance into the right ventricle.  The pacemaker was tested to ensure stable lead placement and pacemaker capture. Aortic root angiography was performed in order to determine the optimal angiographic angle for valve deployment.  TRANSFEMORAL ACCESS:  A micropuncture technique is used to access the right femoral artery under fluoroscopic and ultrasound guidance.  2 Perclose devices are deployed at 10' and 2' positions to 'PreClose' the femoral artery. An 8 French sheath is placed and then an Amplatz Superstiff wire is advanced through the sheath. This is changed out for a 14 French transfemoral E-Sheath after progressively dilating over the Superstiff wire.  An AL-2 catheter was used to direct a straight-tip exchange length wire across the native aortic valve into the left ventricle. This was exchanged out for a pigtail catheter and position was confirmed in the LV apex. Simultaneous LV and Ao pressures were recorded.  The pigtail catheter was exchanged for an Amplatz Extra-stiff wire in the LV apex.    BALLOON AORTIC VALVULOPLASTY:  Not  performed  TRANSCATHETER HEART VALVE DEPLOYMENT:  An Edwards Sapien 3 transcatheter heart valve (size 26 mm) was prepared and crimped per manufacturer's guidelines, and the proper orientation of the valve is confirmed on the [employer] delivery system. The valve was advanced through the introducer sheath using normal technique until in an appropriate position in the abdominal aorta beyond the sheath tip. The balloon was then retracted and using the fine-tuning wheel was centered on the valve. The valve was then advanced across the aortic arch using appropriate flexion of the catheter. The valve was carefully positioned across the aortic valve annulus. The Commander catheter was retracted using normal technique. Once final position of the valve has been confirmed by angiographic assessment, the valve is deployed while temporarily holding ventilation and during rapid ventricular pacing to maintain systolic blood pressure < 50 mmHg and pulse pressure < 10 mmHg. The balloon inflation is held for >3 seconds after reaching full deployment volume. Once the balloon has fully deflated the balloon is retracted into the ascending aorta and valve function is assessed using echocardiography. The patient's hemodynamic recovery following valve deployment is good.  The deployment balloon and guidewire are both removed. Echo demostrated acceptable post-procedural gradients, stable mitral valve function, and no aortic insufficiency.    PROCEDURE COMPLETION:  The sheath was removed and femoral artery closure is performed using the 2 previously deployed Perclose devices.  Protamine is administered once femoral arterial repair was complete. The site is clear with no evidence of bleeding or hematoma after the sutures are tightened. The  pigtail catheters are removed. Mynx closure is used for the left femoral arteriotomy.  The temporary pacing wire is left in place. The patient tolerated the procedure well and is  transported to the surgical intensive care in stable condition. There were no immediate intraoperative complications. All sponge instrument and needle counts are verified correct at completion of the operation.  The patient received a total of 60 mL of intravenous contrast during the procedure.   Sherren Mocha, MD 05/18/2019 11:11 AM

## 2019-05-18 NOTE — Anesthesia Procedure Notes (Signed)
Arterial Line Insertion Start/End10/08/2018 7:15 AM, 05/18/2019 7:17 AM Performed by: Babs Bertin, CRNA, CRNA  Patient location: Pre-op. Preanesthetic checklist: patient identified, IV checked, risks and benefits discussed, surgical consent, monitors and equipment checked and pre-op evaluation Right, radial was placed Catheter size: 20 G Hand hygiene performed  and maximum sterile barriers used   Attempts: 1 Procedure performed without using ultrasound guided technique. Ultrasound Notes:anatomy identified Following insertion, dressing applied and Biopatch.

## 2019-05-19 ENCOUNTER — Ambulatory Visit (HOSPITAL_COMMUNITY): Payer: Medicare Other

## 2019-05-19 ENCOUNTER — Encounter (HOSPITAL_COMMUNITY): Payer: Self-pay | Admitting: Cardiovascular Disease

## 2019-05-19 ENCOUNTER — Other Ambulatory Visit: Payer: Self-pay | Admitting: Physician Assistant

## 2019-05-19 ENCOUNTER — Ambulatory Visit: Payer: Medicare Other

## 2019-05-19 ENCOUNTER — Inpatient Hospital Stay (HOSPITAL_COMMUNITY): Payer: Medicare Other

## 2019-05-19 DIAGNOSIS — Z952 Presence of prosthetic heart valve: Secondary | ICD-10-CM

## 2019-05-19 DIAGNOSIS — I459 Conduction disorder, unspecified: Secondary | ICD-10-CM

## 2019-05-19 LAB — CBC
HCT: 25.4 % — ABNORMAL LOW (ref 36.0–46.0)
Hemoglobin: 8.6 g/dL — ABNORMAL LOW (ref 12.0–15.0)
MCH: 30.7 pg (ref 26.0–34.0)
MCHC: 33.9 g/dL (ref 30.0–36.0)
MCV: 90.7 fL (ref 80.0–100.0)
Platelets: 111 10*3/uL — ABNORMAL LOW (ref 150–400)
RBC: 2.8 MIL/uL — ABNORMAL LOW (ref 3.87–5.11)
RDW: 16.6 % — ABNORMAL HIGH (ref 11.5–15.5)
WBC: 4.5 10*3/uL (ref 4.0–10.5)
nRBC: 0 % (ref 0.0–0.2)

## 2019-05-19 LAB — BASIC METABOLIC PANEL
Anion gap: 7 (ref 5–15)
BUN: 8 mg/dL (ref 8–23)
CO2: 26 mmol/L (ref 22–32)
Calcium: 9 mg/dL (ref 8.9–10.3)
Chloride: 101 mmol/L (ref 98–111)
Creatinine, Ser: 0.61 mg/dL (ref 0.44–1.00)
GFR calc Af Amer: >60 mL/min (ref >60)
GFR calc non Af Amer: >60 mL/min (ref >60)
Glucose, Bld: 112 mg/dL — ABNORMAL HIGH (ref 70–99)
Potassium: 3.8 mmol/L (ref 3.5–5.1)
Sodium: 134 mmol/L — ABNORMAL LOW (ref 135–145)

## 2019-05-19 LAB — GLUCOSE, CAPILLARY
Glucose-Capillary: 177 mg/dL — ABNORMAL HIGH (ref 70–99)
Glucose-Capillary: 131 mg/dL — ABNORMAL HIGH (ref 70–99)
Glucose-Capillary: 150 mg/dL — ABNORMAL HIGH (ref 70–99)
Glucose-Capillary: 165 mg/dL — ABNORMAL HIGH (ref 70–99)
Glucose-Capillary: 191 mg/dL — ABNORMAL HIGH (ref 70–99)
Glucose-Capillary: 196 mg/dL — ABNORMAL HIGH (ref 70–99)

## 2019-05-19 LAB — MAGNESIUM: Magnesium: 1.7 mg/dL (ref 1.7–2.4)

## 2019-05-19 LAB — ECHOCARDIOGRAM COMPLETE
Height: 64 in
Weight: 3033.53 oz

## 2019-05-19 MED ORDER — ALPRAZOLAM 0.5 MG PO TABS
0.5000 mg | ORAL_TABLET | Freq: Once | ORAL | Status: AC
  Administered 2019-05-19: 0.5 mg via ORAL
  Filled 2019-05-19: qty 1

## 2019-05-19 MED ORDER — FUROSEMIDE 20 MG PO TABS
20.0000 mg | ORAL_TABLET | Freq: Every day | ORAL | Status: DC
  Administered 2019-05-19 – 2019-05-21 (×3): 20 mg via ORAL
  Filled 2019-05-19 (×3): qty 1

## 2019-05-19 MED ORDER — AMLODIPINE BESYLATE 10 MG PO TABS
10.0000 mg | ORAL_TABLET | Freq: Every day | ORAL | Status: DC
  Administered 2019-05-19 – 2019-05-21 (×3): 10 mg via ORAL
  Filled 2019-05-19 (×3): qty 1

## 2019-05-19 MED ORDER — IRBESARTAN 150 MG PO TABS
150.0000 mg | ORAL_TABLET | Freq: Two times a day (BID) | ORAL | Status: DC
  Administered 2019-05-19 – 2019-05-21 (×5): 150 mg via ORAL
  Filled 2019-05-19 (×6): qty 1

## 2019-05-19 MED ORDER — DOXAZOSIN MESYLATE 4 MG PO TABS
4.0000 mg | ORAL_TABLET | Freq: Every day | ORAL | Status: DC
  Filled 2019-05-19: qty 1

## 2019-05-19 MED ORDER — DOXAZOSIN MESYLATE 8 MG PO TABS: 8.0000 mg | ORAL_TABLET | Freq: Every day | ORAL | Status: DC

## 2019-05-19 MED ORDER — HYDROCODONE-ACETAMINOPHEN 5-325 MG PO TABS
1.0000 | ORAL_TABLET | ORAL | Status: DC | PRN
  Administered 2019-05-19: 1 via ORAL
  Administered 2019-05-20 – 2019-05-21 (×2): 2 via ORAL
  Filled 2019-05-19: qty 2
  Filled 2019-05-19: qty 1
  Filled 2019-05-19: qty 2

## 2019-05-19 MED FILL — Magnesium Sulfate Inj 50%: INTRAMUSCULAR | Qty: 10 | Status: AC

## 2019-05-19 MED FILL — Heparin Sodium (Porcine) Inj 1000 Unit/ML: INTRAMUSCULAR | Qty: 30 | Status: AC

## 2019-05-19 MED FILL — Potassium Chloride Inj 2 mEq/ML: INTRAVENOUS | Qty: 40 | Status: AC

## 2019-05-19 NOTE — Progress Notes (Signed)
Left femoral venous sheath removed. VSS throughout procedure. Bedrest for two hours, up at 1115.

## 2019-05-19 NOTE — Discharge Summary (Addendum)
[facility] VALVE TEAM  Discharge Summary    Patient ID: Hailey Cobb MRN: [id number]; DOB: Jan 13, 1932  Admit date: 05/11/2019 Discharge date: 05/21/2019  Primary Care Provider: Hulan Fess, MD  Primary Cardiologist: Sinclair Grooms, MD / Dr. Burt Knack & Dr. Roxy Manns (TAVR)  Discharge Diagnoses    Principal Problem:   S/P TAVR (transcatheter aortic valve replacement) Active Problems:   Aortic valve stenosis   Syncope   Diabetes mellitus ([place])   Hypothyroidism   Essential hypertension   Right bundle branch block   HLD (hyperlipidemia)   GERD (gastroesophageal reflux disease)   Acute on chronic diastolic heart failure (HCC)   Elevated troponin   Primary vulvar squamous cell carcinoma (HCC)   Diarrhea   History of seizures   Tachy-brady syndrome (HCC)   Allergies Allergies  Allergen Reactions   Metoprolol Other (See Comments)    Bradycardia    Clonidine Derivatives Other (See Comments)    Dizziness   Glimepiride Other (See Comments)    Hypoglycemia   Invokana [Canagliflozin] Other (See Comments)    Weakness, perineal irritation    Diagnostic Studies/Procedures    TAVR OPERATIVE NOTE   Date of Procedure:                05/18/2019  Preoperative Diagnosis:      Severe Aortic Stenosis   Postoperative Diagnosis:    Same   Procedure:        Transcatheter Aortic Valve Replacement - Percutaneous Right Transfemoral Approach             Edwards Sapien 3 Ultra THV (size 26 mm, model # 9750TFX, serial # [id number])              Co-Surgeons:                        Valentina Gu. Roxy Manns, MD and Sherren Mocha, MD  Anesthesiologist:                  Laurie Panda, MD  Echocardiographer:              Sanda Klein, MD  Pre-operative Echo Findings: ? Severe aortic stenosis ? Normal left ventricular systolic function  Post-operative Echo Findings: ? No paravalvular leak ? Normal left ventricular systolic  function  _____________    Echo 05/19/19: IMPRESSIONS  1. Left ventricular ejection fraction, by visual estimation, is 60 to 65%. The left ventricle has normal function. There is severely increased left ventricular hypertrophy.  2. Left ventricular diastolic Doppler parameters are indeterminate pattern of LV diastolic filling.  3. Global right ventricle was not well visualized.The right ventricular size is not well visualized. Right vetricular wall thickness was not assessed.  4. Left atrial size was not well visualized.  5. Right atrial size was not well visualized.  6. The mitral valve is grossly normal. No evidence of mitral valve regurgitation.  7. The tricuspid valve is not well visualized. Tricuspid valve regurgitation was not visualized by color flow Doppler.  8. Aortic valve regurgitation was not visualized by color flow Doppler.  9. The pulmonic valve was not well visualized. Pulmonic valve regurgitation is not visualized by color flow Doppler. 10. The aortic root was not well visualized. 11. The interatrial septum was not well visualized.  History of Present Illness     Hailey L Beltonis a 85 y.o.femalewith a hx of vulvar cancer undergoing XRT, PVCs, tachy/brady syndrome,advanced conduction dz (  1st deg AV block,RBBB,LAFB),autoimmune hepatitis, DMT2, GERD, HTN, HLD, hypothryoidism, seizures following a subdural hematoma in 2015,IBS with diarrhea,dysphagia,chronic diastolic heart failure, recent admission for GI bleed and acute blood loss anemiaand severe ASwho was admitted to [facility] on [facility]/11[facility]19 for exertional syncope.   Ms. Cobb lives with her husband in [place], [place].  Her husband is quite ill with multiple medical problems and dementia and requires a lot of caregiving assistance.  She has a caregiver who comes to help her every morning.  She had 3 children but only one living daughter, [name].  She is very supportive and lives 5 minutes away. Hailey Cobb still  drives and takes care of most of her ADLs, including cooking and cleaning as well as taking care of her ill husband.  She wears partial dentures and sees a dentist regularly.   She had a traumatic subdural hematoma in 06/2014 with subsequent seizures.  She has been on Keppra since that time with no recurrence of seizures. She was admitted in 01/2017 for symptomatic bradycardia. Beta blocker was discontinued. Heart monitor obtained on 03/01/2017 showed sinus rhythm with PVCs and PACs, brief SVT with heart rate of 150,no pause or excessive bradycardia.Repeat 48-hour monitor obtained on 03/24/2018 showed frequent PVCs with occasional bigeminy and trigeminy, 16% overall PVC burden, occasional SVT up to 11 beats, no evidence of A. fib, no sustained tachybradycardiaarrhythmia. Dr. Rayann Heman saw her in consultation for PVCs in 12/2028 during an admission for abdominal pain. He felt like her PVCs were longstanding and recommended she avoid AV nodal blocking agents given underling conduction abnormalities with 1st deg AVB and RBBB.   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 She was referred to [facility] where she was to undergo surgical resection, but it was cancelled last minute by anesthesia due to concerns about her multifocal ventricular ectopy and known aortic stenosis. Treatment plan was changed to radiation therapy which has been ongoing. PET scan 03/14/19 did not show any hypermetabolic pelvic lymph nodes or evidence of distance metastatic disease.   Repeat echo in 02/2019 showed EF 60-65%, mild-mod MR, severe aortic stenosis with mean/peak gradient of 50.8/81.6 mm Hg, AVA 0.46, DVI 0.17 and mild AI. Dr. Tamala Julian set up [facility] on 04/06/19 which showed mild non obstructive CAD with critical AS (AVA 0.77cm2 and mean/peak gradient 66/80 mmHg). She was referred to Dr. Angelena Form with the multidisciplinary valve team for consideration of TAVR on 04/14/2019.   Plans were made to complete the work-up for TAVR and schedule surgery after completion of her XRT therapy in mid October.  However, she was then admitted to [facility] from 9/15-9/21/20 for weakness and shortness of breath. She was found to have acute blood loss anemia with Hg down to 7.7. FOBT was positive. She was transfused 1U PRBCs. GI recommended upper GI series due to high risk for endoscopic procedures with anesthesia.  She underwent UGI series on 9/18 which showed no significant findings except for mild GERD. Patient was treated with IV Protonix. She was hesitant to have barium enema as she reported discomfort in her perineal area from radiation, so this was canceled.   After discharge she remained very weak and had continued dyspnea. On 05/11/19 she had a syncopal episode at home with a prior period of apnea and CPR was performed by her husband's caregiver (no pulse was checked prior to initiation).  This occurred in the setting of walking to the front door to let the  physical therapist in.  She reports walking to the front door, putting her hand on the doorknob and waking up to EMS surrounding her. Per EMS her systolic blood pressure was in the 80s and she was given 400 mL of normal saline in route. She denies any prodromal syndromes or chest pain.  She was brought to [facility] for further evaluation and work-up.    Hospital Course     Consultants: EP  1. Syncope: felt to be related to her AS but also has significant underlying conduction disease. EP was consulted who felt syncope was not related to [facility] attack. Given exertional syncope, she was kept inpatient until TAVR.   2. Severe AS: s/p successful TAVR with a 26 mm Edwards Sapien Ultra THV via the TF approach on 05/18/19. Post operative echo showed normal LV function and no perivalvular leak. Groin sites are stable. See below regarding rhythm. She has been continued on ASA 81mg  daily and started plavix 75 mg  daily. Her H/H will be followed closely in the setting of recent GI bleed. She will see APP in the office next week at which time we would recommend a f/u CBC.  3. Conduction disease: has underlying RBBB, LAFB and 1st deg AV block. Also with hx of SVT/PAT. She was felt to be at extremely high risk for needing a pacemaker after TAVR. As above, EP consulted who felt that watchful waiting was the best course of action. Surprisingly, she has not had any progression in her conduction disease since TAVR. She had transient sinus bradycardia after TAVR. Temp wire was removed after 24 hours of observation. Today she has been maintaining NSR with occasional PVCs. A Zio patch was placed prior to discharge to monitor for late presenting HAVB.   4. HTN: BP has been elevated as home medications were held in the setting of syncope and severe AS. She has been resumed on her home antihypertensives.   5. Acute on chronic diastolic CHF: she has had some mild volume overload with home diuretics being held. Post op CXR showed mild pulmonary vascular congestion so she received a dose of IV Lasix. She has been resumed on home lasix 20mg  daily and has done well.  6. Recent GI bleed: Hg has remained stable. We will watch closely in the setting of DAPT. CBC planned in the office next week.  7. Vulvar cancer: current XRT therapy on hold. Appreciate the help of Dr. Denman George and Dr. Sondra Come. Plan to resume daily XRT 10/5. Missed sessions will be added back at the end.   8. Hypothyroidism: TSH elevated at 9.841. She will need to follow up with PCP after discharge.  10. DMT2: treated with SSI while admitted. Resume home medications at discharge.   11. Anemia/thrombocytopenia - plt count slightly decreased from prior but stable in last 24 hours. No bleeding reported. F/u planned as above.  12. Nonobstructive CAD - elevated troponin related to demand ischemia. Resume home statin. No AVN blockers given bradycardia. Continue ASA  (also on for TAVR).  She has progressed well. She was seen by PT to increase endurance and is pending OT prior to DC as well. She was active with home health PT, OT, RN prior to admission so we have written to continue these at discharge. Dr. Acie Fredrickson has seen and examined the patient today and feels she is stable for discharge. TAVR team has arranged f/u as outlined below with f/u next week in the office, as well as f/u in 1  month with an echocardiogram. Med list outlined below. Diflucan was removed from medicine list as she reported completing her course. Decision will need to be made about returning to driving by TAVR team given h/o syncope.  _____________  Discharge Vitals Blood pressure 138/66, pulse 63, temperature 98 F (36.7 C), temperature source Oral, resp. rate 18, height 5\' 4"  (1.626 m), weight 85.5 kg, SpO2 94 %.  Filed Weights   05/19/19 0500 05/20/19 0407 05/21/19 0521  Weight: 86 kg 86.3 kg 85.5 kg    Labs & Radiologic Studies    CBC Recent Labs    05/20/19 0317 05/21/19 0414  WBC 4.6 3.4*  HGB 8.5* 8.4*  HCT 25.3* 25.2*  MCV 90.0 90.6  PLT 98* 99*   Basic Metabolic Panel Recent Labs    05/19/19 0346 05/20/19 0317 05/21/19 0414  NA 134* 132* 134*  K 3.8 3.7 3.5  CL 101 98 97*  CO2 26 27 27   GLUCOSE 112* 121* 127*  BUN 8 7* 9  CREATININE 0.61 0.58 0.57  CALCIUM 9.0 8.2* 8.9  MG 1.7  --   --   _____________  Dg Chest 2 View  Result Date: 05/15/2019 CLINICAL DATA:  Preoperative examination EXAM: CHEST - 2 VIEW COMPARISON:  May 11, 2019 FINDINGS: The heart size and mediastinal contours are within normal limits. Both lungs are clear. The visualized skeletal structures are unremarkable. IMPRESSION: No active cardiopulmonary disease. Electronically Signed   By: Dorise Bullion III M.D   On: 05/15/2019 20:53   Ct Head Wo Contrast  Result Date: 05/11/2019 CLINICAL DATA:  Altered level of consciousness EXAM: CT HEAD WITHOUT CONTRAST TECHNIQUE: Contiguous  axial images were obtained from the base of the skull through the vertex without intravenous contrast. COMPARISON:  07/23/2014 FINDINGS: Brain: There is atrophy and chronic small vessel disease changes. No acute intracranial abnormality. Specifically, no hemorrhage, hydrocephalus, mass lesion, acute infarction, or significant intracranial injury. Vascular: No hyperdense vessel or unexpected calcification. Skull: No acute calvarial abnormality. Sinuses/Orbits: Visualized paranasal sinuses and mastoids clear. Orbital soft tissues unremarkable. Other: None IMPRESSION: Atrophy, chronic microvascular disease. No acute intracranial abnormality. Electronically Signed   By: Rolm Baptise M.D.   On: 05/11/2019 20:50   Ct Coronary Morph W/cta Cor W/score W/ca W/cm &/or Wo/cm  Addendum Date: 04/27/2019   ADDENDUM REPORT: 04/27/2019 15:22 ADDENDUM: Please see separate dictation for contemporaneously obtained CTA chest, abdomen and pelvis dated 04/27/2019 for full description of relevant extracardiac findings. Electronically Signed   By: Vinnie Langton M.D.   On: 04/27/2019 15:22   Result Date: 04/27/2019 CLINICAL DATA:  Aortic Stenosis EXAM: Cardiac TAVR CT TECHNIQUE: The patient was scanned on a Siemens Force 270 slice scanner. A 120 kV retrospective scan was triggered in the ascending thoracic aorta at 140 HU's. Gantry rotation speed was 250 msecs and collimation was .6 mm. No beta blockade or nitro were given. The 3D data set was reconstructed in 5% intervals of the R-R cycle. Systolic and diastolic phases were analyzed on a dedicated work station using MPR, MIP and VRT modes. The patient received 80 cc of contrast. FINDINGS: Aortic Valve: Aorta: Moderate calcific atherosclerosis, no aneurysm and normal arch vessels Sino-tubular Junction: 24 mm Ascending Thoracic Aorta: 30 mm Aortic Arch: 25 mm Descending Thoracic Aorta: 22 mm Sinus of Valsalva Measurements: Non-coronary: 28.2 mm Right - coronary: 27.7 mm Left -    coronary: 29.5 mm Coronary Artery Height above Annulus: Left Main: 10.2 mm above annulus Right Coronary: 13.5 mm above annulus  Virtual Basal Annulus Measurements: Maximum / Minimum Diameter: 26.2 mm x 21.2 mm Perimeter: 77 mm Area: 450 mm2 Coronary Arteries: Sufficient height above annulus for deployment Optimum Fluoroscopic Angle for Delivery: RAO 3 Caudal 18 degrees IMPRESSION: 1. Tri leaflet AV with annular area of 450 mm2 suitable for a 26 mm Sapien 3 valve. STJ on small size with extensive calcium at base of left cusp and along the left sinus 2. Optimum angiographic angle for deployment RAO 3 Caudal 18 degrees 3.  Normal aortic root 3.0 cm 4.  Coronary arteries sufficient height above annulus for deployment Jenkins Rouge Electronically Signed: By: Jenkins Rouge M.D. On: 04/27/2019 15:16   Dg Chest Port 1 View  Result Date: 05/18/2019 CLINICAL DATA:  Status post TAVR EXAM: PORTABLE CHEST 1 VIEW COMPARISON:  05/15/2019 FINDINGS: Cardiac shadow is mildly prominent but accentuated by the portable technique. Aortic calcifications are noted. Right jugular central line and changes of prior TAVR are noted. Temporary pacing lead is noted from the femoral approach. The lungs are hypo aerated without focal infiltrate. Some suggestion of mild edema is noted on the left. No pneumothorax is seen. IMPRESSION: Postsurgical changes with suggestion of mild pulmonary edema. Electronically Signed   By: Inez Catalina M.D.   On: 05/18/2019 11:53   Dg Chest Portable 1 View  Result Date: 05/11/2019 CLINICAL DATA:  Syncope, shortness of breath EXAM: PORTABLE CHEST 1 VIEW COMPARISON:  05/02/2019 FINDINGS: Heart is upper limits normal in size. No confluent airspace opacities, effusions or edema. Bilateral shoulder replacements. No acute bony abnormality. IMPRESSION: No active disease. Electronically Signed   By: Rolm Baptise M.D.   On: 05/11/2019 18:20   Dg Chest Portable 1 View  Result Date: 05/02/2019 CLINICAL DATA:  Weakness  EXAM: PORTABLE CHEST 1 VIEW COMPARISON:  12/17/2018 FINDINGS: Cardiomegaly. No focal opacity or pleural effusion. No pneumothorax. Aortic atherosclerosis. Bilateral shoulder replacements IMPRESSION: No active disease.  Cardiomegaly Electronically Signed   By: Donavan Foil M.D.   On: 05/02/2019 16:40   Dg Duanne Limerick W Double Cm (hd Ba)  Result Date: 05/05/2019 CLINICAL DATA:  Heme-positive stool and anemia. Patient cannot undergo sedation for endoscopy. EXAM: UPPER GI SERIES WITH KUB TECHNIQUE: After obtaining a scout radiograph a routine double-contrast upper GI series was performed using thin and high density barium. FLUOROSCOPY TIME:  Fluoroscopy Time:  3 minutes 12 seconds Radiation Exposure Index (if provided by the fluoroscopic device): 40.6 mGy Number of Acquired Spot Images: 13 COMPARISON:  CT abdomen pelvis dated 04/27/2019 and esophagram dated 01/05/2019. FINDINGS: The esophagus demonstrates mild esophageal dysmotility. There is no evidence of intrinsic or extrinsic mass or polyp. No constricting or obstructing lesion is identified. There is no mucosal ulceration. Mucosal folds are normal in appearance. Mild gastroesophageal reflux is seen. The stomach has no evidence of intrinsic or extrinsic mass or polyp. No mucosal ulceration is identified. Mucosal folds are normal in appearance. A small hiatal hernia noted. The duodenum and visualized jejunum have no evidence of intrinsic or extrinsic mass or polyp. No mucosal ulceration is identified. Mucosal folds are normal in appearance. IMPRESSION: No findings to explain heme-positive stool and anemia. Electronically Signed   By: Zerita Boers M.D.   On: 05/05/2019 10:58   Ct Angio Chest Aorta W &/or Wo Contrast  Result Date: 04/27/2019 CLINICAL DATA:  85 year old female with history of severe aortic stenosis. Preprocedural study prior to potential transcatheter aortic valve replacement (TAVR) procedure. EXAM: CT ANGIOGRAPHY CHEST, ABDOMEN AND PELVIS TECHNIQUE:  Multidetector CT imaging  through the chest, abdomen and pelvis was performed using the standard protocol during bolus administration of intravenous contrast. Multiplanar reconstructed images and MIPs were obtained and reviewed to evaluate the vascular anatomy. CONTRAST:  170mL OMNIPAQUE IOHEXOL 350 MG/ML SOLN COMPARISON:  CT the abdomen and pelvis 12/17/2018. FINDINGS: CTA CHEST FINDINGS Cardiovascular: Heart size is mildly enlarged. There is no significant pericardial fluid, thickening or pericardial calcification. There is aortic atherosclerosis, as well as atherosclerosis of the great vessels of the mediastinum and the coronary arteries, including calcified atherosclerotic plaque in the left anterior descending, left circumflex and right coronary arteries. Severe thickening calcification of the aortic valve. Calcifications of the anterior leaflet of the mitral valve and inferior mitral annulus. Mediastinum/Lymph Nodes: No pathologically enlarged mediastinal or hilar lymph nodes. Esophagus is unremarkable in appearance. No axillary lymphadenopathy. Lungs/Pleura: Tiny calcified granuloma in the periphery of the left upper lobe. No suspicious appearing pulmonary nodules or masses are noted. No acute consolidative airspace disease. No pleural effusions. Musculoskeletal/Soft Tissues: Status post bilateral shoulder arthroplasty. There are no aggressive appearing lytic or blastic lesions noted in the visualized portions of the skeleton. CTA ABDOMEN AND PELVIS FINDINGS Hepatobiliary: No suspicious cystic or solid hepatic lesions. No intra or extrahepatic biliary ductal dilatation. Gallbladder is normal in appearance. Pancreas: No pancreatic mass. No pancreatic ductal dilatation. No pancreatic or peripancreatic fluid collections or inflammatory changes. Spleen: Small splenules adjacent to the spleen, otherwise, unremarkable. Adrenals/Urinary Tract: Bilateral kidneys and adrenal glands are normal in appearance. No  hydroureteronephrosis. Urinary bladder is normal in appearance. Stomach/Bowel: Normal appearance of the stomach. No pathologic dilatation of small bowel or colon. Diverticulum in the proximal jejunum measuring 4.3 x 3.0 cm, without surrounding inflammatory changes. A few scattered colonic diverticulae are noted in the sigmoid colon, without surrounding inflammatory changes to suggest an acute diverticulitis at this time. The appendix is not confidently identified and may be surgically absent. Regardless, there are no inflammatory changes noted adjacent to the cecum to suggest the presence of an acute appendicitis at this time. Vascular/Lymphatic: Aortic atherosclerosis, without evidence of aneurysm or dissection in the abdominal or pelvic vasculature. Vascular findings and measurements pertinent to potential TAVR procedure, as detailed below. No lymphadenopathy noted in the abdomen or pelvis. Reproductive: Status post hysterectomy.  Ovaries are trophic. Other: No significant volume of ascites.  No pneumoperitoneum. Musculoskeletal: There are no aggressive appearing lytic or blastic lesions noted in the visualized portions of the skeleton. VASCULAR MEASUREMENTS PERTINENT TO TAVR: AORTA: Minimal Aortic Diameter-17 x 17 mm Severity of Aortic Calcification-mild RIGHT PELVIS: Right Common Iliac Artery - Minimal Diameter-10.3 x 9.4 mm Tortuosity-mild Calcification-none Right External Iliac Artery - Minimal Diameter-6.9 x 5.9 mm Tortuosity-moderate Calcification-none Right Common Femoral Artery - Minimal Diameter-5.9 x 6.8 mm Tortuosity-mild Calcification-mild LEFT PELVIS: Left Common Iliac Artery - Minimal Diameter-9.4 x 8.5 mm Tortuosity-mild Calcification-mild Left External Iliac Artery - Minimal Diameter-6.3 x 5.9 mm Tortuosity-moderate Calcification-none Left Common Femoral Artery - Minimal Diameter-7.0 x 6.8 mm Tortuosity-mild Calcification-none Review of the MIP images confirms the above findings. IMPRESSION: 1.  Vascular findings and measurements pertinent to potential TAVR procedure, as detailed above. 2. Severe thickening calcification of the aortic valve, compatible with the reported clinical history of severe aortic stenosis. 3. Aortic atherosclerosis, in addition to three-vessel coronary artery disease. 4. Colonic diverticulosis, without evidence of acute diverticulitis at this time. 5. Additional incidental findings, as above. Electronically Signed   By: Vinnie Langton M.D.   On: 04/27/2019 16:43   Vas US Carotid  Result  Date: 04/27/2019 Carotid Arterial Duplex Study Other Factors:     Aortic stenosis. Limitations        Today's exam was limited due to the high bifurcation of the                    carotid and the body habitus of the patient. Comparison Study:  No prior study on file for comparison Performing Technologist: Sharion Dove RVS  Examination Guidelines: A complete evaluation includes B-mode imaging, spectral Doppler, color Doppler, and power Doppler as needed of all accessible portions of each vessel. Bilateral testing is considered an integral part of a complete examination. Limited examinations for reoccurring indications may be performed as noted.  Right Carotid Findings: +----------+--------+--------+--------+------------------+------------------+             PSV cm/s EDV cm/s Stenosis Plaque Description Comments            +----------+--------+--------+--------+------------------+------------------+  CCA Prox   58       12                                   intimal thickening  +----------+--------+--------+--------+------------------+------------------+  CCA Distal 59       9                                    intimal thickening  +----------+--------+--------+--------+------------------+------------------+  ICA Prox   60       22                heterogenous                           +----------+--------+--------+--------+------------------+------------------+  ICA Distal 70       24                                                        +----------+--------+--------+--------+------------------+------------------+  ECA        47       11                                                       +----------+--------+--------+--------+------------------+------------------+ +----------+--------+-------+------------+-------------------+             PSV cm/s EDV cms Describe     Arm Pressure (mmHG)  +----------+--------+-------+------------+-------------------+  Subclavian                  Not assessed                      +----------+--------+-------+------------+-------------------+ +---------+--------+--------+----------+  Vertebral PSV cm/s EDV cm/s Retrograde  +---------+--------+--------+----------+  Left Carotid Findings: +----------+--------+--------+--------+------------------+------------------+             PSV cm/s EDV cm/s Stenosis Plaque Description Comments            +----------+--------+--------+--------+------------------+------------------+  CCA Prox   71       12  intimal thickening  +----------+--------+--------+--------+------------------+------------------+  CCA Distal 62       16                                   intimal thickening  +----------+--------+--------+--------+------------------+------------------+  ICA Prox   75       21                heterogenous                           +----------+--------+--------+--------+------------------+------------------+  ICA Distal 73       25                                                       +----------+--------+--------+--------+------------------+------------------+  ECA        84       3                                                        +----------+--------+--------+--------+------------------+------------------+ +----------+--------+--------+--------+-------------------+             PSV cm/s EDV cm/s Describe Arm Pressure (mmHG)  +----------+--------+--------+--------+-------------------+  Subclavian 50                                               +----------+--------+--------+--------+-------------------+ +---------+--------+--+--------+-+  Vertebral PSV cm/s 26 EDV cm/s 9  +---------+--------+--+--------+-+  Summary: Right Carotid: The extracranial vessels were near-normal with only minimal wall                thickening or plaque. Left Carotid: The extracranial vessels were near-normal with only minimal wall               thickening or plaque.  *See table(s) above for measurements and observations.  Electronically signed by Monica Martinez MD on 04/27/2019 at 5:23:47 PM.    Final    Ct Angio Abdomen Pelvis  W &/or Wo Contrast  Result Date: 04/27/2019 CLINICAL DATA:  85 year old female with history of severe aortic stenosis. Preprocedural study prior to potential transcatheter aortic valve replacement (TAVR) procedure. EXAM: CT ANGIOGRAPHY CHEST, ABDOMEN AND PELVIS TECHNIQUE: Multidetector CT imaging through the chest, abdomen and pelvis was performed using the standard protocol during bolus administration of intravenous contrast. Multiplanar reconstructed images and MIPs were obtained and reviewed to evaluate the vascular anatomy. CONTRAST:  173mL OMNIPAQUE IOHEXOL 350 MG/ML SOLN COMPARISON:  CT the abdomen and pelvis 12/17/2018. FINDINGS: CTA CHEST FINDINGS Cardiovascular: Heart size is mildly enlarged. There is no significant pericardial fluid, thickening or pericardial calcification. There is aortic atherosclerosis, as well as atherosclerosis of the great vessels of the mediastinum and the coronary arteries, including calcified atherosclerotic plaque in the left anterior descending, left circumflex and right coronary arteries. Severe thickening calcification of the aortic valve. Calcifications of the anterior leaflet of the mitral valve and inferior mitral annulus. Mediastinum/Lymph Nodes: No pathologically enlarged mediastinal or hilar lymph nodes. Esophagus is unremarkable in  appearance. No axillary  lymphadenopathy. Lungs/Pleura: Tiny calcified granuloma in the periphery of the left upper lobe. No suspicious appearing pulmonary nodules or masses are noted. No acute consolidative airspace disease. No pleural effusions. Musculoskeletal/Soft Tissues: Status post bilateral shoulder arthroplasty. There are no aggressive appearing lytic or blastic lesions noted in the visualized portions of the skeleton. CTA ABDOMEN AND PELVIS FINDINGS Hepatobiliary: No suspicious cystic or solid hepatic lesions. No intra or extrahepatic biliary ductal dilatation. Gallbladder is normal in appearance. Pancreas: No pancreatic mass. No pancreatic ductal dilatation. No pancreatic or peripancreatic fluid collections or inflammatory changes. Spleen: Small splenules adjacent to the spleen, otherwise, unremarkable. Adrenals/Urinary Tract: Bilateral kidneys and adrenal glands are normal in appearance. No hydroureteronephrosis. Urinary bladder is normal in appearance. Stomach/Bowel: Normal appearance of the stomach. No pathologic dilatation of small bowel or colon. Diverticulum in the proximal jejunum measuring 4.3 x 3.0 cm, without surrounding inflammatory changes. A few scattered colonic diverticulae are noted in the sigmoid colon, without surrounding inflammatory changes to suggest an acute diverticulitis at this time. The appendix is not confidently identified and may be surgically absent. Regardless, there are no inflammatory changes noted adjacent to the cecum to suggest the presence of an acute appendicitis at this time. Vascular/Lymphatic: Aortic atherosclerosis, without evidence of aneurysm or dissection in the abdominal or pelvic vasculature. Vascular findings and measurements pertinent to potential TAVR procedure, as detailed below. No lymphadenopathy noted in the abdomen or pelvis. Reproductive: Status post hysterectomy.  Ovaries are trophic. Other: No significant volume of ascites.  No pneumoperitoneum. Musculoskeletal: There are  no aggressive appearing lytic or blastic lesions noted in the visualized portions of the skeleton. VASCULAR MEASUREMENTS PERTINENT TO TAVR: AORTA: Minimal Aortic Diameter-17 x 17 mm Severity of Aortic Calcification-mild RIGHT PELVIS: Right Common Iliac Artery - Minimal Diameter-10.3 x 9.4 mm Tortuosity-mild Calcification-none Right External Iliac Artery - Minimal Diameter-6.9 x 5.9 mm Tortuosity-moderate Calcification-none Right Common Femoral Artery - Minimal Diameter-5.9 x 6.8 mm Tortuosity-mild Calcification-mild LEFT PELVIS: Left Common Iliac Artery - Minimal Diameter-9.4 x 8.5 mm Tortuosity-mild Calcification-mild Left External Iliac Artery - Minimal Diameter-6.3 x 5.9 mm Tortuosity-moderate Calcification-none Left Common Femoral Artery - Minimal Diameter-7.0 x 6.8 mm Tortuosity-mild Calcification-none Review of the MIP images confirms the above findings. IMPRESSION: 1. Vascular findings and measurements pertinent to potential TAVR procedure, as detailed above. 2. Severe thickening calcification of the aortic valve, compatible with the reported clinical history of severe aortic stenosis. 3. Aortic atherosclerosis, in addition to three-vessel coronary artery disease. 4. Colonic diverticulosis, without evidence of acute diverticulitis at this time. 5. Additional incidental findings, as above. Electronically Signed   By: Vinnie Langton M.D.   On: 04/27/2019 16:43   Disposition   Pt is being discharged home today in good condition.  Follow-up Plans & Appointments    Follow-up Information    Care, [facility] Follow up.   Specialty: [place] Why: Registered Nurse, Physical and Occupational Therapy Contact information: [place] [address] [phone]        Eileen Stanford, PA-C. Go on 05/25/2019.   Specialties: Cardiology, Radiology Why: @ 1pm, please arrive at least 10 minutes early. Contact information: [place] [address] [phone]          Discharge Instructions    Amb Referral to Cardiac Rehabilitation   Complete by: As directed    Diagnosis: Valve Replacement   Valve: Aortic   After initial evaluation and assessments completed: Virtual  Based Care may be provided alone or in conjunction with Phase 2 Cardiac Rehab based on patient barriers.: No   Diet - low sodium heart healthy   Complete by: As directed    Discharge instructions   Complete by: As directed    You were started on a new medicine called clopidogrel/Plavix. If you notice any bleeding such as blood in stool, black tarry stools, blood in urine, nosebleeds or any other unusual bleeding, call your doctor immediately. It is not normal to have this kind of bleeding while on a blood thinner and usually indicates there is an underlying problem with one of your body systems that needs to be checked out.   Fluconazole/diflucan was removed from your medicine list as you reported completing the prior course.   Increase activity slowly   Complete by: As directed    See end of this summary for more detailed post-op instructions.  You will have an appointment with Nell Range in the office 10/8 and 06/08/19 as outlined below.      Discharge Medications   Allergies as of 05/21/2019      Reactions   Metoprolol Other (See Comments)   Bradycardia   Clonidine Derivatives Other (See Comments)   Dizziness   Glimepiride Other (See Comments)   Hypoglycemia   Invokana [canagliflozin] Other (See Comments)   Weakness, perineal irritation      Medication List    STOP taking these medications   fluconazole 100 MG tablet Commonly known as: DIFLUCAN     TAKE these medications   ALPRAZolam 0.5 MG tablet Commonly known as: XANAX Take 0.5 mg by mouth See admin instructions. Take 0.5 mg by mouth daily at bedtime and may also take an additional 0.5 mg during the day as needed for anxiety   amLODipine 10 MG  tablet Commonly known as: NORVASC Take 10 mg by mouth daily.   aspirin 81 MG chewable tablet Chew 81 mg by mouth daily.   Astepro 0.15 % Soln Generic drug: Azelastine HCl Place 2 sprays into both nostrils at bedtime.   clopidogrel 75 MG tablet Commonly known as: PLAVIX Take 1 tablet (75 mg total) by mouth daily.   diphenoxylate-atropine 2.5-0.025 MG tablet Commonly known as: LOMOTIL Take 1 tablet by mouth 4 (four) times daily as needed for diarrhea or loose stools.   doxazosin 2 MG tablet Commonly known as: CARDURA Take 4-8 mg by mouth See admin instructions. Take 4 mg by mouth in the morning and 8 mg in the evening   esomeprazole 40 MG capsule Commonly known as: NEXIUM Take 1 capsule (40 mg total) by mouth 2 (two) times daily before a meal for 30 days.   furosemide 40 MG tablet Commonly known as: LASIX Take 20 mg by mouth daily.   HYDROcodone-acetaminophen 5-325 MG tablet Commonly known as: NORCO/VICODIN Take 1-2 tablets by mouth every 4 (four) hours as needed for severe pain. Do not take and drive   irbesartan 712 MG tablet Commonly known as: AVAPRO Take 150 mg by mouth 2 (two) times daily.   levETIRAcetam 500 MG tablet Commonly known as: KEPPRA Take 500 mg by mouth daily.   levothyroxine 125 MCG tablet Commonly known as: SYNTHROID Take 1 tablet (125 mcg total) by mouth daily before breakfast for 30 days.   metFORMIN 1000 MG tablet Commonly known as: GLUCOPHAGE Take 1,000 mg by mouth 2 (two) times daily with a meal.   OVER THE COUNTER MEDICATION Take 3 tablets by mouth daily. Thymic factor vitamins  polyvinyl alcohol 1.4 % ophthalmic solution Commonly known as: LIQUIFILM TEARS Place 1 drop into both eyes every 6 (six) hours as needed for dry eyes.   rosuvastatin 20 MG tablet Commonly known as: CRESTOR Take 20 mg by mouth See admin instructions. Take 20 mg by mouth at bedtime only on Mon/Wed/Fri nights   sitaGLIPtin 100 MG tablet Commonly known as:  JANUVIA Take 100 mg by mouth daily.           Outstanding Labs/Studies   CBC  Duration of Discharge Encounter   Greater than 30 minutes including physician time.  Signed, Charlie Pitter, PA-C (discharge summary initially started by Nell Range PA-C with structural team, taken over day of DC) 05/21/2019, 12:54 PM    Attending Note:   The patient was seen and examined.  Agree with assessment and plan as noted above.  Changes made to the above note as needed.  Patient seen and independently examined with  Melina Copa, PA .   We discussed all aspects of the encounter. I agree with the assessment and plan as stated above.  1.  AS / s/p TAVR   :   Overall is doing well.   See progress note from today. Follow up with the TAVR team . Is generally week but seems to be doing better today    I have spent a total of 40 minutes with patient reviewing hospital  notes , telemetry, EKGs, labs and examining patient as well as establishing an assessment and plan that was discussed with the patient. > 50% of time was spent in direct patient care.    Thayer Headings, Brooke Bonito., MD, [facility] 05/21/2019, 8:56 PM [address],  [place] Pager [phone]

## 2019-05-19 NOTE — Progress Notes (Signed)
[id number] Came to see pt to see if we could walk. Pt stated that she had gotten to [facility] with staff and could barely put left foot down. Left foot slightly swollen- pt just got lasix. Pt was doing ankle pumps to help loosen up ankle when we entered room.  She stated she felt that muscle tight at ankle. Encouraged her to continue ankle pumps and try to get up later with staff. RN aware of discomfort back of ankle. Graylon Good RN BSN 05/19/2019 2:56 PM

## 2019-05-19 NOTE — Progress Notes (Addendum)
EKG and telemetry reviewed. EKG this AM is SR 62bpm, RBBB, LAD, no progression in conduction system disease noted from pre-TAVR telemtry initially post procedure yesterday with SB 30's,  She a few V paced beats intermittently for a few hours. Since then she has been SB/SR 50's-60's without further pacing,   Had one fleeting PAT  Temp wire remains  I do not think she will need pacing at this juncture, with ZIO AT monitoring  I will review with Dr. Lovena Le for final recommendations regarding temp wire/pacing.  Tommye Standard, PA-C  EP Attending  Patient seen and examined. She appears to be doing well after TAVR with persistent AV conduction and RBBB. Ok to remove temp wire. I would watch at least 24-48 hours. DC home this weekend if stable.   Mikle Bosworth.D.

## 2019-05-19 NOTE — Progress Notes (Signed)
  Echocardiogram 2D Echocardiogram has been performed.  Hailey Cobb 05/19/2019, 9:00 AM

## 2019-05-19 NOTE — Anesthesia Postprocedure Evaluation (Signed)
Anesthesia Post Note  Patient: Anabela L Vandeventer  Procedure(s) Performed: TRANSCATHETER AORTIC VALVE REPLACEMENT, TRANSFEMORAL (N/A Chest) TRANSESOPHAGEAL ECHOCARDIOGRAM (TEE) (N/A Esophagus)     Patient location during evaluation: ICU Anesthesia Type: MAC Level of consciousness: awake and alert Pain management: pain level controlled Vital Signs Assessment: post-procedure vital signs reviewed and stable Respiratory status: spontaneous breathing, nonlabored ventilation, respiratory function stable and patient connected to nasal cannula oxygen Cardiovascular status: stable and blood pressure returned to baseline Postop Assessment: no apparent nausea or vomiting Anesthetic complications: no    Last Vitals:  Vitals:   05/19/19 1200 05/19/19 1350  BP: (!) 160/58   Pulse: 89 80  Resp: 18 19  Temp:  36.8 C  SpO2: 93% 95%    Last Pain:  Vitals:   05/19/19 1350  TempSrc: Oral  PainSc:                  [facility]

## 2019-05-19 NOTE — Progress Notes (Signed)
patient arrived the unit from [facility] on a wheelchair, assessment completed see flow sheet, placed on tele ccmd notified, patient oriented to room and staff, bed in lowest position call bell within reach will continue to monitor.

## 2019-05-19 NOTE — Progress Notes (Signed)
Notified by CCMD that patient had a 13 beat run SVT @1950 . Patient asymptomatic. Will continue to monitor

## 2019-05-19 NOTE — Progress Notes (Addendum)
Progress Note  Patient Name: Hailey Cobb Date of Encounter: 05/19/2019  Primary Cardiologist: Sinclair Grooms, MD   Subjective   She is doing well but is complaining of headaches which has been occurring overnight. Denies chest pain, palpitation, shortness of breath, vision changes, nausea or vomiting. She was able to eat yesterday after her procedure  Inpatient Medications    Scheduled Meds: . aspirin  81 mg Oral Daily  . azelastine  2 spray Each Nare QHS  . Chlorhexidine Gluconate Cloth  6 each Topical Daily  . clopidogrel  75 mg Oral Q breakfast  . insulin aspart  0-24 Units Subcutaneous TID AC & HS  . sodium chloride flush  3 mL Intravenous Q12H   Continuous Infusions: . sodium chloride    . sodium chloride 10 mL/hr at 05/19/19 0600  . cefUROXime (ZINACEF)  IV Stopped (05/19/19 0420)  . nitroGLYCERIN 35 mcg/min (05/19/19 0600)  . phenylephrine (NEO-SYNEPHRINE) Adult infusion     PRN Meds: sodium chloride, sodium chloride, acetaminophen **OR** acetaminophen, diphenoxylate-atropine, morphine injection, ondansetron (ZOFRAN) IV, oxyCODONE, sodium chloride flush, traMADol   Vital Signs    Vitals:   05/19/19 0300 05/19/19 0400 05/19/19 0500 05/19/19 0600  BP: (!) 128/53 (!) 119/41  (!) 122/49  Pulse: 67 67 74 69  Resp: (!) 25 (!) 24 15 (!) 21  Temp:  98.9 F (37.2 C)    TempSrc:  Oral    SpO2: 97% 95% 96% 95%  Weight:   86 kg   Height:        Intake/Output Summary (Last 24 hours) at 05/19/2019 0701 Last data filed at 05/19/2019 0600 Gross per 24 hour  Intake 1846.71 ml  Output 675 ml  Net 1171.71 ml   Filed Weights   05/17/19 0457 05/18/19 0415 05/19/19 0500  Weight: 84.6 kg 82.9 kg 86 kg    Telemetry     PVCs, bradycardia and tachycardia- Personally Reviewed  ECG    Normal simus, RBBB and bifascicular block - Personally Reviewed  Physical Exam   GEN: No acute distress.   Cardiac:Flow murmurs, rubs, or gallops.  Respiratory: Clear to  auscultation bilaterally. GI: Soft, nontender, non-distended  MS: No edema; No deformity. Neuro:  Nonfocal  Psych: Normal affect  Skin: Right groin access unremarkable  Labs    Chemistry Recent Labs  Lab 05/15/19 2010 05/17/19 0912 05/18/19 0507 05/18/19 0928 05/18/19 1015 05/18/19 1134 05/19/19 0346  NA 130* 132* 132* 134* 135 130* 134*  K 3.8 4.3 3.9 3.9 3.9 3.7 3.8  CL 94* 97* 98 97*  --   --  101  CO2 25 23 25   --   --   --  26  GLUCOSE 181* 220* 151* 159* 204* 218* 112*  BUN 13 10 8  7*  --   --  8  CREATININE 0.66 0.68 0.61 0.40*  --   --  0.61  CALCIUM 9.8 9.5 9.3  --   --   --  9.0  PROT 7.0  --   --   --   --   --   --   ALBUMIN 3.4*  --   --   --   --   --   --   AST 22  --   --   --   --   --   --   ALT 19  --   --   --   --   --   --   ALKPHOS 78  --   --   --   --   --   --  BILITOT 0.1*  --   --   --   --   --   --   GFRNONAA >60 >60 >60  --   --   --  >60  GFRAA >60 >60 >60  --   --   --  >60  ANIONGAP 11 12 9   --   --   --  7     Hematology Recent Labs  Lab 05/17/19 1015 05/18/19 0507  05/18/19 1015 05/18/19 1134 05/19/19 0346  WBC 2.4* 2.9*  --   --   --  4.5  RBC 3.46* 3.40*  --   --   --  2.80*  HGB 10.5* 10.0*   < > 9.5* 9.5* 8.6*  HCT 30.6* 30.5*   < > 28.0* 28.0* 25.4*  MCV 88.4 89.7  --   --   --  90.7  MCH 30.3 29.4  --   --   --  30.7  MCHC 34.3 32.8  --   --   --  33.9  RDW 16.5* 16.5*  --   --   --  16.6*  PLT 148* 144*  --   --   --  111*   < > = values in this interval not displayed.    Cardiac EnzymesNo results for input(s): TROPONINI in the last 168 hours. No results for input(s): TROPIPOC in the last 168 hours.   BNPNo results for input(s): BNP, PROBNP in the last 168 hours.   DDimer No results for input(s): DDIMER in the last 168 hours.   Radiology    Dg Chest Port 1 View  Result Date: 05/18/2019 CLINICAL DATA:  Status post TAVR EXAM: PORTABLE CHEST 1 VIEW COMPARISON:  05/15/2019 FINDINGS: Cardiac shadow is mildly  prominent but accentuated by the portable technique. Aortic calcifications are noted. Right jugular central line and changes of prior TAVR are noted. Temporary pacing lead is noted from the femoral approach. The lungs are hypo aerated without focal infiltrate. Some suggestion of mild edema is noted on the left. No pneumothorax is seen. IMPRESSION: Postsurgical changes with suggestion of mild pulmonary edema. Electronically Signed   By: Inez Catalina M.D.   On: 05/18/2019 11:53    Cardiac Studies   Echocardiogram May 18, 2019.  POST-OP IMPRESSIONS - Left Ventricle: has normal systolic function, with an ejection fraction of 60%. - Aortic Valve: No stenosis present. Manufactured by; an Oletta Lamas. There is no regurgitation. Trivial perivalvular leak is seen. The gradient recorded across the prosthetic valve is within the expected range. Peak gradient 12 mm Hg, mean gradient 5 mm Hg. Dimensionless obstructive index 0.86. Estimated valve area 2.7 cm sq. - Mitral Valve: Mitral regurgitation is mild, reduced compared to pre TAVR - Pericardium: There is no effusion.  PRE-OP FINDINGS  Left Ventricle: The left ventricle has normal systolic function, with an ejection fraction of 55-60%. The cavity size was normal. There is mildly increased left ventricular wall thickness. Right Ventricle: The right ventricle has normal systolic function. The cavity was normal. There is no increase in right ventricular wall thickness. Left Atrium: Left atrial size was dilated. Right Atrium: Right atrial size was not assessed. Right atrial pressure is estimated at 10 mmHg. Interatrial Septum: The interatrial septum was not assessed. Pericardium: There is no evidence of pericardial effusion. Mitral Valve: The mitral valve is normal in structure. Mitral valve regurgitation is mild to moderate by color flow Doppler. The MR jet is centrally-directed. Tricuspid Valve: The tricuspid valve was normal in structure.  Tricuspid  valve regurgitation was not assessed by color flow Doppler. Aortic Valve: The aortic valve is tricuspid There is Severely thickening of the aortic valve and There is Severe calcifcation of the aortic valve Aortic valve regurgitation is mild to moderate by color flow Doppler. The jet is centrally-directed. There  is severe stenosis of the aortic valve, with a calculated valve area of 0.46 cm. Pulmonic Valve: The pulmonic valve was not assessed. Pulmonic valve regurgitation was not assessed by color flow Doppler.     Patient Profile     85 y.o. female with medical history significant for chronic diastolic heart failure, severe aortic stenosis, right bundle branch block, tachybradycardia syndrome, hypertension, hyperlipidemia, hypothyroidism, diabetes mellitus, seizures following subdural hematoma, vulvar cancer currently undergoing high beam radiation therapy, autoimmune hepatitis, irritable bowel syndrome-diarrhea predominant who presented to the emergency department on May 11, 2019 following a syncopal episode.  CPR was briefly initiated by husband though her pulse was not checked prior to initiation of CPR.  She subsequently underwent transcatheter aortic valve replacement-percutaneous transfemoral approach on May 18, 2019 and tolerated procedure well.  Assessment & Plan   POD #1  #Severe aortic valve stenosis: She is now status post transcatheter aortic valve replacement-percutaneous transfemoral approach now POD #1.  Postoperatively, EKG revealed sinus bradycardia with HR in the 30s.  This a.m., she is hemodynamically stable with pulse of 69.  Repeat EKG shows normal sinus rhythm with RBBB and bifascicular block.  #Conduction disease: Known history of right bundle branch block, bradycardia and PVCs.  This has previously been worked up outpatient as well as inpatient service when she was admitted in May.  It was suggested that she avoid all AV nodal blocking agents.  Following TAVR on  10/1, she became bradycardic with HR in the 30s however this a.m. HR has improved to 69 and currently remains hemodynamically stable.  Review of EKG shows normal sinus with RBBB, bifascicular block. She has been evaluated by electrophysiology. Temporary pacing wires still in placed -Appreciate assistance from electrophysiology.  #Hypertension: Remains hemodynamically stable.  Remains on nitroglycerin infusion. ART Bp in the 190s/50s -Closely monitor for side effects (headaches)  -Will slowly add on home antihypertensives (Irbesartan, doxazosin, amlodipine)  #Coronary artery disease: 50% stenosis of proximal LAD and 30% stenosis of mid circumflex. -Continue aspirin and Plavix  #Anemia secondary to blood loss: Hgb this am of 8.6 (<<9.5). Will continue to monitor   For questions or updates, please contact [place] Please consult [email or web address] for contact info under Cardiology/STEMI.      Signed, Jean Rosenthal, MD  05/19/2019, 7:01 AM    Patient seen, examined. Available data reviewed. Agree with findings, assessment, and plan as outlined by Dr Eileen Stanford.  The patient is interviewed and examined this morning with the housestaff.  She is an elderly, alert and oriented woman in no distress.  JVP is normal, lungs are clear, heart is regular rate and rhythm with a soft 2/6 ejection murmur best heard at the left upper sternal border, no diastolic murmur, abdomen soft nontender, bilateral groin sites clear, no pretibial edema.  Telemetry is reviewed and shows normal sinus rhythm with a heart rate of 60 bpm, baseline right bundle branch block unchanged.  Appreciate EP team following her.  She has done really well in the early postoperative period.  Her temporary pacing wire will be removed.  We will transfer her to telemetry today with plans to send her home with an event monitor.  She will continue on  aspirin and clopidogrel.  Avoid AV nodal blocking agents.  Anticipate hospital discharge tomorrow.  We  will also remove her arterial line this morning.  There is a big discrepancy between her arterial line and cuff pressure that I think is related to whip artifact in the arterial line.  Sherren Mocha, M.D. 05/19/2019 9:59 AM

## 2019-05-20 LAB — BASIC METABOLIC PANEL
Anion gap: 7 (ref 5–15)
BUN: 7 mg/dL — ABNORMAL LOW (ref 8–23)
CO2: 27 mmol/L (ref 22–32)
Calcium: 8.2 mg/dL — ABNORMAL LOW (ref 8.9–10.3)
Chloride: 98 mmol/L (ref 98–111)
Creatinine, Ser: 0.58 mg/dL (ref 0.44–1.00)
GFR calc Af Amer: >60 mL/min (ref >60)
GFR calc non Af Amer: >60 mL/min (ref >60)
Glucose, Bld: 121 mg/dL — ABNORMAL HIGH (ref 70–99)
Potassium: 3.7 mmol/L (ref 3.5–5.1)
Sodium: 132 mmol/L — ABNORMAL LOW (ref 135–145)

## 2019-05-20 LAB — CBC
HCT: 25.3 % — ABNORMAL LOW (ref 36.0–46.0)
Hemoglobin: 8.5 g/dL — ABNORMAL LOW (ref 12.0–15.0)
MCH: 30.2 pg (ref 26.0–34.0)
MCHC: 33.6 g/dL (ref 30.0–36.0)
MCV: 90 fL (ref 80.0–100.0)
Platelets: 98 10*3/uL — ABNORMAL LOW (ref 150–400)
RBC: 2.81 MIL/uL — ABNORMAL LOW (ref 3.87–5.11)
RDW: 16.5 % — ABNORMAL HIGH (ref 11.5–15.5)
WBC: 4.6 10*3/uL (ref 4.0–10.5)
nRBC: 0 % (ref 0.0–0.2)

## 2019-05-20 LAB — GLUCOSE, CAPILLARY
Glucose-Capillary: 142 mg/dL — ABNORMAL HIGH (ref 70–99)
Glucose-Capillary: 174 mg/dL — ABNORMAL HIGH (ref 70–99)
Glucose-Capillary: 160 mg/dL — ABNORMAL HIGH (ref 70–99)
Glucose-Capillary: 170 mg/dL — ABNORMAL HIGH (ref 70–99)

## 2019-05-20 NOTE — Progress Notes (Signed)
Education completed re: home exercise progression, activity restrictions, signs and symptoms of infection, heart healthy eating.  Referred to phase II cardiac rehab, but not certain she can participate.  She has 2 more weeks of cancer treatments and cares for a husband with dementia which requires constant care.  She has someone watch him in the morning for a few hours. [phone]

## 2019-05-20 NOTE — Progress Notes (Signed)
CARDIAC REHAB PHASE I   PRE:  Rate/Rhythm: 71 SR  BP:  Supine:   Sitting: 134/74  Standing:    SaO2: 95% RA  MODE:  Ambulation: 160 ft   POST:  Rate/Rhythm: 89 SR  BP:  Supine:   Sitting: 138/80  Standing:    SaO2: 95% RA Able to bear weight on left ankle today, describes it as "stiff" today.  Walked with rollator and assistance x 1 and tolerated well.  Placed back in chair upon return to room and set her up to eat lunch.  Will come back this afternoon to educate for possible discharge this weekend.[place] RN, BSN 05/20/2019 12:18 PM

## 2019-05-20 NOTE — Progress Notes (Signed)
Progress Note  Patient Name: Hailey Cobb Date of Encounter: 05/20/2019  Primary Cardiologist: Sinclair Grooms, MD   Subjective   85 year old female with a history of severe aortic stenosis, chronic diastolic congestive heart failure, , Right bundle branch block, hypertension, hyperlipidemia.  She is status post TAVR.  Following her TAVR, she became bradycardic with a heart rate in the 30s.  He has been seen by EP and her conduction system was thought to be stable.  Temp Pacing wire has been removed.   Her legs are weak following the procedure.  Was not able to walk.   Will get a PT / OT consult.   Rhythm as stabilized.    Inpatient Medications    Scheduled Meds: . amLODipine  10 mg Oral Daily  . aspirin  81 mg Oral Daily  . azelastine  2 spray Each Nare QHS  . Chlorhexidine Gluconate Cloth  6 each Topical Daily  . clopidogrel  75 mg Oral Q breakfast  . furosemide  20 mg Oral Daily  . insulin aspart  0-24 Units Subcutaneous TID AC & HS  . irbesartan  150 mg Oral BID  . sodium chloride flush  3 mL Intravenous Q12H   Continuous Infusions: . sodium chloride    . sodium chloride 10 mL/hr at 05/19/19 1642  . nitroGLYCERIN Stopped (05/19/19 0924)  . phenylephrine (NEO-SYNEPHRINE) Adult infusion     PRN Meds: sodium chloride, sodium chloride, acetaminophen **OR** acetaminophen, diphenoxylate-atropine, HYDROcodone-acetaminophen, morphine injection, ondansetron (ZOFRAN) IV, oxyCODONE, sodium chloride flush, traMADol   Vital Signs    Vitals:   05/19/19 2016 05/20/19 0046 05/20/19 0407 05/20/19 0816  BP: (!) 166/59 (!) 128/52 (!) 130/45 (!) 142/78  Pulse: 83 81 80 78  Resp: (!) 22 (!) 26 (!) 25 (!) 22  Temp: 98.2 F (36.8 C) 98.2 F (36.8 C) 99.3 F (37.4 C) 98.2 F (36.8 C)  TempSrc: Oral Oral Oral Oral  SpO2: 94% 95% 92% 94%  Weight:   86.3 kg   Height:        Intake/Output Summary (Last 24 hours) at 05/20/2019 0949 Last data filed at 05/20/2019 0243 Gross per  24 hour  Intake 352.23 ml  Output -  Net 352.23 ml   Last 3 Weights 05/20/2019 05/19/2019 05/18/2019  Weight (lbs) 190 lb 4.1 oz 189 lb 9.5 oz 182 lb 11.2 oz  Weight (kg) 86.3 kg 86 kg 82.872 kg      Telemetry    NSR , RBBB  - Personally Reviewed  ECG     - Personally Reviewed  Physical Exam   GEN:  Female, no acute distress. Neck: No JVD Cardiac: RRR, soft systolic murmur. Respiratory:  Early atelectasis in the bases.  Some mild consolidation in both bases. GI: Soft, nontender, non-distended  MS: No edema; No deformity.  Left femoral site (previous temporary pacing wire placement) looks stable. Neuro:  Nonfocal  Psych: Normal affect   Labs    High Sensitivity Troponin:   Recent Labs  Lab 05/11/19 1806 05/11/19 2112 05/12/19 0014  TROPONINIHS 29* 49* 53*      Chemistry Recent Labs  Lab 05/15/19 2010  05/18/19 0507 05/18/19 0928  05/18/19 1134 05/19/19 0346 05/20/19 0317  NA 130*   < > 132* 134*   < > 130* 134* 132*  K 3.8   < > 3.9 3.9   < > 3.7 3.8 3.7  CL 94*   < > 98 97*  --   --  101 98  CO2 25   < > 25  --   --   --  26 27  GLUCOSE 181*   < > 151* 159*   < > 218* 112* 121*  BUN 13   < > 8 7*  --   --  8 7*  CREATININE 0.66   < > 0.61 0.40*  --   --  0.61 0.58  CALCIUM 9.8   < > 9.3  --   --   --  9.0 8.2*  PROT 7.0  --   --   --   --   --   --   --   ALBUMIN 3.4*  --   --   --   --   --   --   --   AST 22  --   --   --   --   --   --   --   ALT 19  --   --   --   --   --   --   --   ALKPHOS 78  --   --   --   --   --   --   --   BILITOT 0.1*  --   --   --   --   --   --   --   GFRNONAA >60   < > >60  --   --   --  >60 >60  GFRAA >60   < > >60  --   --   --  >60 >60  ANIONGAP 11   < > 9  --   --   --  7 7   < > = values in this interval not displayed.     Hematology Recent Labs  Lab 05/18/19 0507  05/18/19 1134 05/19/19 0346 05/20/19 0317  WBC 2.9*  --   --  4.5 4.6  RBC 3.40*  --   --  2.80* 2.81*  HGB 10.0*   < > 9.5* 8.6* 8.5*  HCT  30.5*   < > 28.0* 25.4* 25.3*  MCV 89.7  --   --  90.7 90.0  MCH 29.4  --   --  30.7 30.2  MCHC 32.8  --   --  33.9 33.6  RDW 16.5*  --   --  16.6* 16.5*  PLT 144*  --   --  111* 98*   < > = values in this interval not displayed.    BNPNo results for input(s): BNP, PROBNP in the last 168 hours.   DDimer No results for input(s): DDIMER in the last 168 hours.   Radiology    Dg Chest Port 1 View  Result Date: 05/18/2019 CLINICAL DATA:  Status post TAVR EXAM: PORTABLE CHEST 1 VIEW COMPARISON:  05/15/2019 FINDINGS: Cardiac shadow is mildly prominent but accentuated by the portable technique. Aortic calcifications are noted. Right jugular central line and changes of prior TAVR are noted. Temporary pacing lead is noted from the femoral approach. The lungs are hypo aerated without focal infiltrate. Some suggestion of mild edema is noted on the left. No pneumothorax is seen. IMPRESSION: Postsurgical changes with suggestion of mild pulmonary edema. Electronically Signed   By: Inez Catalina M.D.   On: 05/18/2019 11:53    Cardiac Studies      Patient Profile     85 y.o. female   [place]    1.  Aortic stenosis: She is status  post TAVR.  From a cardiac standpoint she seems to be doing well.  She is generally weak and states that she could not walk last night.  We will get a PT and OT consult.  We will continue work with her and she will be stable for discharge once she is able to ambulate.  2.  Bradycardia: She had some temporary bradycardia following TAVR.  Her rhythm is stabilized.  She does have a right bundle branch block at baseline.  She has a Zio  event monitor patch in place.  3.  Pulmonary :   Has some atelectasis on exam.  I looked in her room for her incentive spirometer but could not find it.  I will reorder an incentive spirometer.  For questions or updates, please contact [place] Please consult [email or web address] for contact info under        Signed, Mertie Moores, MD  05/20/2019, 9:49 AM

## 2019-05-20 NOTE — Plan of Care (Signed)
  Problem: Clinical Measurements: Goal: Respiratory complications will improve Outcome: Progressing   Problem: Activity: Goal: Risk for activity intolerance will decrease Outcome: Progressing   

## 2019-05-21 ENCOUNTER — Encounter (HOSPITAL_COMMUNITY): Payer: Self-pay | Admitting: Physician Assistant

## 2019-05-21 DIAGNOSIS — I495 Sick sinus syndrome: Secondary | ICD-10-CM

## 2019-05-21 DIAGNOSIS — Z87898 Personal history of other specified conditions: Secondary | ICD-10-CM

## 2019-05-21 LAB — BASIC METABOLIC PANEL
Anion gap: 10 (ref 5–15)
BUN: 9 mg/dL (ref 8–23)
CO2: 27 mmol/L (ref 22–32)
Calcium: 8.9 mg/dL (ref 8.9–10.3)
Chloride: 97 mmol/L — ABNORMAL LOW (ref 98–111)
Creatinine, Ser: 0.57 mg/dL (ref 0.44–1.00)
GFR calc Af Amer: >60 mL/min (ref >60)
GFR calc non Af Amer: >60 mL/min (ref >60)
Glucose, Bld: 127 mg/dL — ABNORMAL HIGH (ref 70–99)
Potassium: 3.5 mmol/L (ref 3.5–5.1)
Sodium: 134 mmol/L — ABNORMAL LOW (ref 135–145)

## 2019-05-21 LAB — CBC
HCT: 25.2 % — ABNORMAL LOW (ref 36.0–46.0)
Hemoglobin: 8.4 g/dL — ABNORMAL LOW (ref 12.0–15.0)
MCH: 30.2 pg (ref 26.0–34.0)
MCHC: 33.3 g/dL (ref 30.0–36.0)
MCV: 90.6 fL (ref 80.0–100.0)
Platelets: 99 10*3/uL — ABNORMAL LOW (ref 150–400)
RBC: 2.78 MIL/uL — ABNORMAL LOW (ref 3.87–5.11)
RDW: 16.7 % — ABNORMAL HIGH (ref 11.5–15.5)
WBC: 3.4 10*3/uL — ABNORMAL LOW (ref 4.0–10.5)
nRBC: 0 % (ref 0.0–0.2)

## 2019-05-21 LAB — GLUCOSE, CAPILLARY
Glucose-Capillary: 135 mg/dL — ABNORMAL HIGH (ref 70–99)
Glucose-Capillary: 162 mg/dL — ABNORMAL HIGH (ref 70–99)

## 2019-05-21 MED ORDER — LINAGLIPTIN 5 MG PO TABS
5.0000 mg | ORAL_TABLET | Freq: Every day | ORAL | Status: DC
  Administered 2019-05-21: 5 mg via ORAL
  Filled 2019-05-21: qty 1

## 2019-05-21 MED ORDER — ALPRAZOLAM 0.5 MG PO TABS
0.5000 mg | ORAL_TABLET | Freq: Every evening | ORAL | Status: DC | PRN
  Administered 2019-05-21: 0.5 mg via ORAL
  Filled 2019-05-21: qty 1

## 2019-05-21 MED ORDER — PANTOPRAZOLE SODIUM 40 MG PO TBEC
40.0000 mg | DELAYED_RELEASE_TABLET | Freq: Every day | ORAL | Status: DC
  Administered 2019-05-21: 40 mg via ORAL
  Filled 2019-05-21: qty 1

## 2019-05-21 MED ORDER — ROSUVASTATIN CALCIUM 20 MG PO TABS: 20.0000 mg | ORAL_TABLET | ORAL | Status: DC

## 2019-05-21 MED ORDER — LEVETIRACETAM 500 MG PO TABS
500.0000 mg | ORAL_TABLET | Freq: Every day | ORAL | Status: DC
  Administered 2019-05-21: 500 mg via ORAL
  Filled 2019-05-21: qty 1

## 2019-05-21 MED ORDER — LEVOTHYROXINE SODIUM 25 MCG PO TABS
125.0000 ug | ORAL_TABLET | Freq: Every day | ORAL | Status: DC
  Administered 2019-05-21: 125 ug via ORAL
  Filled 2019-05-21: qty 1

## 2019-05-21 MED ORDER — CLOPIDOGREL BISULFATE 75 MG PO TABS: 75.0000 mg | ORAL_TABLET | Freq: Every day | ORAL | 6 refills | Status: DC

## 2019-05-21 NOTE — Progress Notes (Addendum)
Progress Note  Patient Name: Hailey Cobb Date of Encounter: 05/21/2019  Primary Cardiologist: Hailey Grooms, MD  Subjective   Stable. Thinks she's making progress. No CP or dyspnea. Just feels weak - legs, back. Walked briefly with cardiac rehab yesterday. PT/OT consult pending. Was active with [facility], PT, OT at home.  Inpatient Medications    Scheduled Meds: . amLODipine  10 mg Oral Daily  . aspirin  81 mg Oral Daily  . azelastine  2 spray Each Nare QHS  . Chlorhexidine Gluconate Cloth  6 each Topical Daily  . clopidogrel  75 mg Oral Q breakfast  . furosemide  20 mg Oral Daily  . insulin aspart  0-24 Units Subcutaneous TID AC & HS  . irbesartan  150 mg Oral BID  . levETIRAcetam  500 mg Oral Daily  . levothyroxine  125 mcg Oral QAC breakfast  . linagliptin  5 mg Oral Daily  . pantoprazole  40 mg Oral Daily  . [START ON 05/22/2019] rosuvastatin  20 mg Oral Q M,W,F-2000  . sodium chloride flush  3 mL Intravenous Q12H   Continuous Infusions: . sodium chloride    . sodium chloride 10 mL/hr at 05/19/19 1642  . nitroGLYCERIN Stopped (05/19/19 0924)  . phenylephrine (NEO-SYNEPHRINE) Adult infusion     PRN Meds: sodium chloride, sodium chloride, acetaminophen **OR** acetaminophen, ALPRAZolam, diphenoxylate-atropine, HYDROcodone-acetaminophen, morphine injection, ondansetron (ZOFRAN) IV, oxyCODONE, sodium chloride flush, traMADol   Vital Signs    Vitals:   05/21/19 0358 05/21/19 0400 05/21/19 0521 05/21/19 0828  BP: (!) 147/53 (!) 147/53  (!) 157/59  Pulse: 78 72 92 63  Resp: 20 (!) 25 (!) 21 18  Temp: 98.4 F (36.9 C)   98.4 F (36.9 C)  TempSrc: Oral   Oral  SpO2: 93% 90% 94% 94%  Weight:   85.5 kg   Height:        Intake/Output Summary (Last 24 hours) at 05/21/2019 0854 Last data filed at 05/20/2019 2256 Gross per 24 hour  Intake 250 ml  Output -  Net 250 ml   Last 3 Weights 05/21/2019 05/20/2019 05/19/2019  Weight (lbs) 188 lb 8 oz 190 lb 4.1 oz 189  lb 9.5 oz  Weight (kg) 85.503 kg 86.3 kg 86 kg     Telemetry    NSR occasional PVCs, brief ventricular bigeminy, 5 beats SVT - Personally Reviewed  Physical Exam   GEN: No acute distress.  HEENT: Normocephalic, atraumatic, sclera non-icteric. Neck: No JVD or bruits. Cardiac: RRR no murmurs, rubs, or gallops.  Radials/DP/PT 1+ and equal bilaterally.  Respiratory: Coarse crackles throughout. Breathing is unlabored. GI: Soft, nontender, non-distended, BS +x 4. MS: no deformity. Extremities: No clubbing or cyanosis. No edema. Distal pedal pulses are 2+ and equal bilaterally. Left femoral pacer wire site stable. Right femoral site also stable without ecchymosis, hematoma or bruit. Neuro:  AAOx3. Follows commands. Psych:  Responds to questions appropriately with a normal affect.  Labs    High Sensitivity Troponin:   Recent Labs  Lab 05/11/19 1806 05/11/19 2112 05/12/19 0014  TROPONINIHS 29* 49* 53*      Cardiac EnzymesNo results for input(s): TROPONINI in the last 168 hours. No results for input(s): TROPIPOC in the last 168 hours.   Chemistry Recent Labs  Lab 05/15/19 2010  05/19/19 0346 05/20/19 0317 05/21/19 0414  NA 130*   < > 134* 132* 134*  K 3.8   < > 3.8 3.7 3.5  CL 94*   < >  101 98 97*  CO2 25   < > 26 27 27   GLUCOSE 181*   < > 112* 121* 127*  BUN 13   < > 8 7* 9  CREATININE 0.66   < > 0.61 0.58 0.57  CALCIUM 9.8   < > 9.0 8.2* 8.9  PROT 7.0  --   --   --   --   ALBUMIN 3.4*  --   --   --   --   AST 22  --   --   --   --   ALT 19  --   --   --   --   ALKPHOS 78  --   --   --   --   BILITOT 0.1*  --   --   --   --   GFRNONAA >60   < > >60 >60 >60  GFRAA >60   < > >60 >60 >60  ANIONGAP 11   < > 7 7 10    < > = values in this interval not displayed.     Hematology Recent Labs  Lab 05/19/19 0346 05/20/19 0317 05/21/19 0414  WBC 4.5 4.6 3.4*  RBC 2.80* 2.81* 2.78*  HGB 8.6* 8.5* 8.4*  HCT 25.4* 25.3* 25.2*  MCV 90.7 90.0 90.6  MCH 30.7 30.2 30.2   MCHC 33.9 33.6 33.3  RDW 16.6* 16.5* 16.7*  PLT 111* 98* 99*    BNPNo results for input(s): BNP, PROBNP in the last 168 hours.   DDimer No results for input(s): DDIMER in the last 168 hours.   Radiology    No results found.  Patient Profile     85 y.o. female with chronic diastolic congestive heart failure, non-insulin-dependent diabetes mellitus, severe stage D aortic valve stenosis, nonobstructive CAD by cath 03/2019, chronic conduction disease with RBBB and h/o tachybradycardia syndrome, hypertension, hyperlipidemia, frequent PVCs, hypothyroidism, seizures following a subdural hematoma in 2015, IBS with diarrhea, dysphagia, ABL anemia/GIB 04/2019, vulvar cancer on radiation who presented to the hospital for evaluation of syncope in setting of AS and dehydration related to diarrhea.  She underwent TAVR this admission. Following her TAVR, she became bradycardic with a heart rate in the 30s. She has been seen by EP and her conduction system was thought to be stable. Temp pacing wire has been removed and Zio patch placed.   Assessment & Plan    1. Severe AS - s/p TAVR. From cardiac standpoint progressing well and stable. Now on Plavix in addition to ASA. Biggest issue appears deconditioning related to weak legs/back. She is active with home health RN, PT, and OT which I will reorder for home. PT/OT consult still pending. Will change status to "imminent DC" so that we can get a dispo for today. ? D/C IJ.  2. Acute on chronic diastolic CHF - received Lasix IV briefly post-op. Still with crackles on exam. Dr. Acie Cobb felt this was related atx yesterday so IS recommended. Weight up about 2kg from outpatient setting, but down 2lb from yesterday. Continue Lasix 20mg  daily. Will review plan with Dr. Acie Cobb.  3. Bradycardia - known issue for the patient even prior to admission. EP saw her and did not feel she required pacer. Hailey Cobb placed a Zio patch which will be followed in outpatient setting.  Tele the last 24 hours shows NSR with occasional PVCs (also known history of such), and 5 beat run SVT.  4. Essential HTN - mildly elevated but variable in last 24 hours.  Continue present regimen. Can f/u as OP.  5. Anemia/thrombocytopenia - plt count slightly decreased from prior but stable in last 24 hours. No bleeding reported.  6. Nonobstructive CAD - elevated troponin related to demand ischemia. Resume home statin. No AVN blockers given bradycardia. Continue ASA (also on for TAVR).  7. DM - resume home gliptin today. Anticipate resuming metformin at DC.  8. H/o seizures in 2015 after SDH - home Keppra discontinued early in hospitalization - looks like this was not an intentional long term plan since other home meds like levothyroxine and protonix discontinued as well.  9. IBS with diarrhea - diarrhea resolved.  For questions or updates, please contact [place] Please consult [email or web address] for contact info under Cardiology/STEMI.  Signed, Charlie Pitter, PA-C 05/21/2019, 8:54 AM    Attending Note:   The patient was seen and examined.  Agree with assessment and plan as noted above.  Changes made to the above note as needed.  Patient seen and independently examined with  Melina Copa, PA .   We discussed all aspects of the encounter. I agree with the assessment and plan as stated above.  1.  AS/ s/p TAVR:   Doing well from a cardiac standpoint.   Still weak.   awatiing PT / OT  evaluation to make sure she is strong enough to go home.   2.  Bradycardia:   HR is better   3  HTN:   BP mildly elevated.   Will follow up with Dr. Tamala Julian and primary MD as an outpatient.    I have spent a total of 40 minutes with patient reviewing hospital  notes , telemetry, EKGs, labs and examining patient as well as establishing an assessment and plan that was discussed with the patient. > 50% of time was spent in direct patient care.    Thayer Headings, Brooke Bonito., MD, [facility] 05/21/2019, 9:29 AM [address],  [place] Pager [phone]

## 2019-05-21 NOTE — Discharge Instructions (Signed)
ACTIVITY AND EXERCISE  Daily activity and exercise are an important part of your recovery. People recover at different rates depending on their general health and type of valve procedure.  Most people recovering from TAVR feel better relatively quickly   No lifting, pushing, pulling more than 10 pounds (examples to avoid: groceries, vacuuming, gardening, golfing):             - For one week with a procedure through the groin.             - For six weeks for procedures through the chest wall or neck. NOTE: You will typically see one of our providers 7-14 days after your procedure to discuss [place] the above activities.      DRIVING  Do not drive until you are seen for follow up and cleared by a provider.   If you have been told by your doctor in the past that you may not drive, you must talk with him/her before you begin driving again.   DRESSING  Groin site: you may leave the clear dressing over the site for up to one week or until it falls off.   HYGIENE  If you had a femoral (leg) procedure, you may take a shower when you return home. After the shower, pat the site dry. Do NOT use powder, oils or lotions in your groin area until the site has completely healed.  If you had a chest procedure, you may shower when you return home unless specifically instructed not to by your discharging practitioner.             - DO NOT scrub incision; pat dry with a towel.             - DO NOT apply any lotions, oils, powders to the incision.             - No tub baths / swimming for at least 2 weeks.  If you notice any fevers, chills, increased pain, swelling, bleeding or pus, please contact your doctor.   ADDITIONAL INFORMATION  If you are going to have an upcoming dental procedure, please contact our office as you will require antibiotics ahead of time to prevent infection on your heart valve.    If you have any questions or concerns you can call the structural heart phone during  normal business hours 8am-4pm. If you have an urgent need after hours or weekends please call [phone] to talk to the on call provider for general cardiology. If you have an emergency that requires immediate attention, please call 911.    After TAVR Checklist  Check  Test Description   Follow up appointment in 1-2 weeks  You will see our structural heart physician assistant, Nell Range. Your incision sites will be checked and you will be cleared to drive and resume all normal activities if you are doing well.     1 month echo and follow up  You will have an echo to check on your new heart valve and be seen back in the office by Nell Range. Many times the echo is not read by your appointment time, but Joellen Jersey will call you later that day or the following day to report your results.   Follow up with your primary cardiologist You will need to be seen by your primary cardiologist in the following 3-6 months after your 1 month appointment in the valve clinic. Often times your Plavix or Aspirin will be discontinued during this  time, but this is decided on a case by case basis.    1 year echo and follow up You will have another echo to check on your heart valve after 1 year and be seen back in the office by Nell Range. This your last structural heart visit.   Bacterial endocarditis prophylaxis  You will have to take antibiotics for the rest of your life before all dental procedures (even teeth cleanings) to protect your heart valve. Antibiotics are also required before some surgeries. Please check with your cardiologist before scheduling any surgeries. Also, please make sure to tell us if you have a penicillin allergy as you will require an alternative antibiotic.

## 2019-05-21 NOTE — Progress Notes (Signed)
Discharge AVS meds take and those due reviewed with pt. Follow up appointments and when to call MD reviewed. All questions and concerns addressed. No further questions at this time. D/c IV and TELE, CCMD notified. D/C home per orders. Pt received 3in1 at bedside. Pt brought down with all belongings with staff via wheelchair.  Amanda Cockayne, RN

## 2019-05-21 NOTE — Progress Notes (Signed)
Occupational Therapy Re-Evaluation Patient Details Name: Hailey Cobb MRN: [id number] DOB: Jan 02, 1932 Today's Date: 05/21/2019    History of Present Illness 85 y.o. female with medical history significant of chronic diastolic congestive heart failure, non-insulin-dependent diabetes mellitus, severe stage D aortic valve stenosis, RBBB, tachybradycardia syndrome, hypertension, hyperlipidemia, hypothyroidism, subdural hematoma, seizure, vulvar cancer on radiation presented to the hospital for evaluation of syncope.s/p TAVR 10/1.   Clinical Impression   Familiar with pt from previous visit while at [facility]. Pt able to complete ADL session with VSS with overall set up. Pt with 1/4 DOE. Educated pt on energy conservation and reducing risk of falls. Discussed pt's role of caregiver for her husband and need for her to focus on her healing rather than her caregiver role at this time. Feel pt is appropriate for [place] with program like [facility] to assist pt with facilitation to return to independenne with ADL and IADL tasks.     Follow Up Recommendations  Home health OT;Supervision - Intermittent    Equipment Recommendations  3 in 1 bedside commode    Recommendations for Other Services       Precautions / Restrictions Precautions Precautions: Fall Precaution Comments: monitor for signs of syncope Restrictions Weight Bearing Restrictions: No      Mobility Bed Mobility               General bed mobility comments: OOB in chair  Transfers Overall transfer level: Needs assistance Equipment used: Rolling walker (2 wheeled) Transfers: Sit to/from [facility] Sit to Stand: Supervision Stand pivot transfers: Supervision       General transfer comment: supervision for safety.    Balance Overall balance assessment: Needs assistance Sitting-balance support: Feet supported;No upper extremity supported Sitting balance-Leahy Scale: Normal     Standing balance support:  During functional activity Standing balance-Leahy Scale: Good Standing balance comment: Able to walk around room taking hands off rollatoer to grab bag out of closet and reach up on shelf without LOB.                           ADL either performed or assessed with clinical judgement   ADL Overall ADL's : Needs assistance/impaired                                     Functional mobility during ADLs: Supervision/safety;Rolling walker General ADL Comments: Able to complete ADL session with set up. Educated pt on energy conservation adn reducing risk of falls. Discussed options of DME for bathroom toilet. HHOT should most likely assess. PT reports she can get DME through [place]      Pertinent Vitals/Pain Pain Assessment: Faces Faces Pain Scale: Hurts a little bit Pain Location: bil feet Pain Descriptors / Indicators: Discomfort;Sore Pain Intervention(s): Limited activity within patient's tolerance     Hand Dominance     Extremity/Trunk Assessment Upper Extremity Assessment Upper Extremity Assessment: Generalized weakness   Lower Extremity Assessment Lower Extremity Assessment: Generalized weakness(B feet edema)   Cervical / Trunk Assessment Cervical / Trunk Assessment: Normal   Communication Communication Communication: No difficulties   Cognition Arousal/Alertness: Awake/alert Behavior During Therapy: WFL for tasks assessed/performed Overall Cognitive Status: Within Functional Limits for tasks assessed  General Comments  VSS throughout    Exercises     Shoulder Instructions      Home Living Family/patient expects to be discharged to:: Private residence Living Arrangements: Spouse/significant other Available Help at Discharge: Available PRN/intermittently Type of Home: House Home Access: Ramped entrance Entrance Stairs-Number of Steps:  2-3 Entrance Stairs-Rails: Can reach both Home Layout: One level     Bathroom Shower/Tub: Corporate investment banker: Standard Bathroom Accessibility: Yes How Accessible: Accessible via walker Home Equipment: Grab bars - tub/shower;Walker - 2 wheels;Shower seat   Additional Comments: husband has dementia with CGs in AM for ADL routine M-F; Pt performs ADL/IADL independently until recently      Prior Functioning/Environment Level of Independence: Needs assistance  Gait / Transfers Assistance Needed: independent with very short distance ambulation with RW,  ADL's / Homemaking Assistance Needed: performs ADLs with difficulty, daughter assists with iADLs            OT Problem List: Decreased strength;Decreased activity tolerance;Decreased knowledge of use of DME or AE;Cardiopulmonary status limiting activity;Pain      OT Treatment/Interventions: Self-care/ADL training;Therapeutic exercise;Energy conservation;DME and/or AE instruction;Therapeutic activities;Patient/family education;Balance training    OT Goals(Current goals can be found in the care plan section) Acute Rehab OT Goals Patient Stated Goal: to get stronger and back to being independent OT Goal Formulation: With patient Time For Goal Achievement: 05/28/19 Potential to Achieve Goals: Good  OT Frequency: Min 2X/week   Barriers to D/C:            Co-evaluation              AM-PAC OT "6 Clicks" Daily Activity     Outcome Measure Help from another person eating meals?: None Help from another person taking care of personal grooming?: None Help from another person toileting, which includes using toliet, bedpan, or urinal?: None Help from another person bathing (including washing, rinsing, drying)?: A Little Help from another person to put on and taking off regular upper body clothing?: None Help from another person to put on and taking off regular lower body clothing?: A Little 6 Click Score:  22   End of Session Equipment Utilized During Treatment: Rolling walker Nurse Communication: Mobility status  Activity Tolerance: Patient tolerated treatment well Patient left: in chair;with call bell/phone within reach  OT Visit Diagnosis: Unsteadiness on feet (R26.81);Muscle weakness (generalized) (M62.81) Pain - part of body: Ankle and joints of foot                Time: 9767-3419 OT Time Calculation (min): 30 min Charges:  OT General Charges $OT Visit: 1 Visit OT Evaluation $OT Re-eval: 1 Re-eval OT Treatments $Self Care/Home Management : 8-22 mins  Maurie Boettcher, OT/L   Acute OT Clinical Specialist [place] Pager [phone] Office [phone]   [facility] 05/21/2019, 1:46 PM

## 2019-05-21 NOTE — Progress Notes (Signed)
Physical Therapy Treatment Patient Details Name: Hailey Cobb MRN: [id number] DOB: 12-15-1931 Today's Date: 05/21/2019    History of Present Illness 85 y.o. female with medical history significant of chronic diastolic congestive heart failure, non-insulin-dependent diabetes mellitus, severe stage D aortic valve stenosis, RBBB, tachybradycardia syndrome, hypertension, hyperlipidemia, hypothyroidism, subdural hematoma, seizure, vulvar cancer on radiation presented to the hospital for evaluation of syncope.s/p TAVR 10/1.    PT Comments    Patient progressing well post TAVR. Reports minimal to no SOB or dyspnea with ambulation or activity. Pt's only complaint is bil feet swelling and pain in ankles which she reports is new. Was given lasix yesterday so hoping this will help and has been doing ankle pumps. Tolerated post TAVR assessment, ambulating 608 feet during the 6 minute walk test with no rest breaks needed which is less than the distance walked prior to the TAVR however pt attributes this to her feet pain/swelling. Pt does state that breathing and dyspnea are much improved and she is better able to tolerate activity. VSS throughout with max HR 90 bpm. Encouraged elevating BLEs and increasing activity. Will continue to follow and progress as tolerated. See below for post TAVR assessment.  05/21/2019 PT TAVR Post-Assessment  HPI: 85 y.o. female with medical history significant of chronic diastolic congestive heart failure, non-insulin-dependent diabetes mellitus, severe stage D aortic valve stenosis, RBBB, tachybradycardia syndrome, hypertension, hyperlipidemia, hypothyroidism, subdural hematoma, seizure, vulvar cancer on radiation presented to the hospital for evaluation of syncope.s/p TAVR 10/1.  General UE/LE Strength and ROM:  Strength (0-5/5) ROM (limited/full)  R UE WFL WFL  L UE WFL WFL  R LE WFL, generally weak, tight ankle WFL, limited ankle DF due to swelling  L LE WFL, generally  weak, tight ankle WFL except limited ankle DF due to swelling    6 Minute Walk Test:   Total Distance Walked:608 ft.    Did the pt need a rest break? No If yes, why? Pain:Yes; Fatigue:No; Dyspnea/O2 saturations: Yes Comments: Pt required no standing rest breaks, 1/4 DOE, only limited by pain in bil ankles/feet due to swelling/tightness per report.   Pre-Test Post-Test  BP 134/63 145/66  HR 74 90  O2 saturations (indicated RA or L/min ) 94% RA 95% RA  Modified Borg Dyspnea Scale (0 none-10 maximal) 0 2  RPE (6 very light-10 very hard) 6 11  Comments:   5 Meter Walk Test:  Trial 1 11.4 seconds  Trial 2 11.3 seconds  Trial 3 11.3 seconds  3 Trial Average/Gait Speed 11.3 seconds/1.13  ft/sec (<1.8 ft/sec indicates high fall risk)  Comments: Of note, the pre TAVR assessment was performed on 9/26 and pt has not been as mobile since surgery.  Clinical Frailty Scale (1 very fit - 9 terminally ill): 5 (</= 5/12 is considered frail)    Follow Up Recommendations  Home health PT;Supervision - Intermittent     Equipment Recommendations  None recommended by PT    Recommendations for Other Services       Precautions / Restrictions Precautions Precautions: Fall Precaution Comments: monitor for signs of syncope Restrictions Weight Bearing Restrictions: No    Mobility  Bed Mobility               General bed mobility comments: up in chair upon PT arrival.  Transfers Overall transfer level: Needs assistance Equipment used: 4-wheeled walker Transfers: Sit to/from Stand Sit to Stand: Supervision         General transfer comment: supervision for safety.  Ambulation/Gait Ambulation/Gait assistance: Supervision Gait Distance (Feet): 720 Feet Assistive device: 4-wheeled walker Gait Pattern/deviations: Step-through pattern;Decreased stride length;Wide base of support Gait velocity: good speed   General Gait Details: Steady gait using rollator in hallway, complains  of discomfort and swelling in bil feet which did not improve with activity.   Stairs             Wheelchair Mobility    Modified Rankin (Stroke Patients Only)       Balance Overall balance assessment: Needs assistance Sitting-balance support: Feet supported;No upper extremity supported Sitting balance-Leahy Scale: Good     Standing balance support: During functional activity Standing balance-Leahy Scale: Good Standing balance comment: Able to walk around room taking hands off rollatoer to grab bag out of closet and reach up on shelf without LOB.                            Cognition Arousal/Alertness: Awake/alert Behavior During Therapy: WFL for tasks assessed/performed Overall Cognitive Status: Within Functional Limits for tasks assessed                                        Exercises      General Comments General comments (skin integrity, edema, etc.): VSS throughout      Pertinent Vitals/Pain Pain Assessment: Faces Faces Pain Scale: Hurts a little bit Pain Location: bil feet Pain Descriptors / Indicators: Discomfort;Sore Pain Intervention(s): Monitored during session;Repositioned    Home Living                      Prior Function            PT Goals (current goals can now be found in the care plan section) Progress towards PT goals: Progressing toward goals    Frequency    Min 3X/week      PT Plan Current plan remains appropriate    Co-evaluation              AM-PAC PT "6 Clicks" Mobility   Outcome Measure  Help needed turning from your back to your side while in a flat bed without using bedrails?: None Help needed moving from lying on your back to sitting on the side of a flat bed without using bedrails?: None Help needed moving to and from a bed to a chair (including a wheelchair)?: None Help needed standing up from a chair using your arms (e.g., wheelchair or bedside chair)?: None Help needed  to walk in hospital room?: None Help needed climbing 3-5 steps with a railing? : A Little 6 Click Score: 23    End of Session Equipment Utilized During Treatment: Gait belt Activity Tolerance: Patient tolerated treatment well Patient left: in chair;with call bell/phone within reach Nurse Communication: Mobility status PT Visit Diagnosis: Other abnormalities of gait and mobility (R26.89);Muscle weakness (generalized) (M62.81);Difficulty in walking, not elsewhere classified (R26.2);History of falling (Z91.81);Repeated falls (R29.6)     Time: 3220-2542 PT Time Calculation (min) (ACUTE ONLY): 33 min  Charges:  $Therapeutic Activity: 8-22 mins $Physical Performance Test: 8-22 mins                     Wray Kearns, PT, DPT Acute Rehabilitation Services Pager [phone] Office [place] 05/21/2019, 11:43 AM

## 2019-05-21 NOTE — TOC Transition Note (Signed)
Transition of Care [facility]) - CM/SW Discharge Note   Patient Details  Name: Hailey Cobb MRN: [id number] Date of Birth: [birth year]  [facility] CM/SW Contact:  Carles Collet, RN Phone Number: 05/21/2019, 2:12 PM   Clinical Narrative:   Patient active w Al[facility]ings, will resume. 3/1 ordered to be delivered to patient room prior to DC.     Final next level of care: [place] Barriers to Discharge: No Barriers Identified   Patient Goals and CMS Choice Patient states their goals for this hospitalization and ongoing recovery are:: to go home CMS Medicare.gov Compare Post Acute Care list provided to:: Patient Choice offered to / list presented to : Patient  Discharge Placement                       Discharge Plan and Services In-house Referral: NA Discharge Planning Services: CM Consult Post Acute Care Choice: Home Health          DME Arranged: 3-N-1 DME Agency: [employer] Date DME Agency Contacted: 05/21/19 Time DME Agency Contacted: [phone] Representative spoke with at DME Agency: [place]: RN, PT, OT [place] Agency: [place] Date [place]: 05/21/19 Time [place]: [id number] Representative spoke with at [place]: cory  Social Determinants of Health ([place]) Interventions     Readmission Risk Interventions No flowsheet data found.

## 2019-05-22 ENCOUNTER — Ambulatory Visit
Admission: RE | Admit: 2019-05-22 | Discharge: 2019-05-22 | Disposition: A | Discharge status: Home / Self Care | Payer: Medicare Other | Source: Ambulatory Visit | Attending: Radiation Oncology | Admitting: Radiation Oncology

## 2019-05-22 ENCOUNTER — Other Ambulatory Visit: Payer: Self-pay

## 2019-05-22 ENCOUNTER — Ambulatory Visit: Payer: Medicare Other

## 2019-05-22 ENCOUNTER — Encounter: Payer: Medicare Other | Admitting: Thoracic Surgery (Cardiothoracic Vascular Surgery)

## 2019-05-22 ENCOUNTER — Ambulatory Visit: Payer: Medicare Other | Admitting: Physical Therapy

## 2019-05-22 ENCOUNTER — Telehealth: Payer: Self-pay | Admitting: Physician Assistant

## 2019-05-22 DIAGNOSIS — I350 Nonrheumatic aortic (valve) stenosis: Secondary | ICD-10-CM | POA: Diagnosis not present

## 2019-05-22 DIAGNOSIS — C519 Malignant neoplasm of vulva, unspecified: Secondary | ICD-10-CM | POA: Diagnosis not present

## 2019-05-22 DIAGNOSIS — I110 Hypertensive heart disease with heart failure: Secondary | ICD-10-CM | POA: Diagnosis not present

## 2019-05-22 DIAGNOSIS — D649 Anemia, unspecified: Secondary | ICD-10-CM | POA: Diagnosis not present

## 2019-05-22 DIAGNOSIS — Z510 Encounter for antineoplastic radiation therapy: Secondary | ICD-10-CM | POA: Diagnosis not present

## 2019-05-22 DIAGNOSIS — D696 Thrombocytopenia, unspecified: Secondary | ICD-10-CM | POA: Diagnosis not present

## 2019-05-22 DIAGNOSIS — K922 Gastrointestinal hemorrhage, unspecified: Secondary | ICD-10-CM | POA: Diagnosis not present

## 2019-05-22 NOTE — Telephone Encounter (Signed)
  [place] VALVE TEAM   Patient contacted regarding discharge from [facility] on 10/4  Patient understands to follow up with provider Nell Range on 10/8 at [facility].  Patient understands discharge instructions? yes Patient understands medications and regimen? yes Patient understands to bring all medications to this visit? Yes  She was worried about starting XRT with groin site incisions from TAVR. I reassured her that they should not be affected by her vaginal XRT.  Angelena Form PA-C  [facility]

## 2019-05-23 ENCOUNTER — Telehealth (HOSPITAL_COMMUNITY): Payer: Self-pay

## 2019-05-23 ENCOUNTER — Ambulatory Visit: Payer: Medicare Other

## 2019-05-23 ENCOUNTER — Ambulatory Visit
Admission: RE | Admit: 2019-05-23 | Discharge: 2019-05-23 | Disposition: A | Discharge status: Home / Self Care | Payer: Medicare Other | Source: Ambulatory Visit | Attending: Radiation Oncology | Admitting: Radiation Oncology

## 2019-05-23 ENCOUNTER — Other Ambulatory Visit: Payer: Self-pay

## 2019-05-23 DIAGNOSIS — D696 Thrombocytopenia, unspecified: Secondary | ICD-10-CM | POA: Diagnosis not present

## 2019-05-23 DIAGNOSIS — D649 Anemia, unspecified: Secondary | ICD-10-CM | POA: Diagnosis not present

## 2019-05-23 DIAGNOSIS — I350 Nonrheumatic aortic (valve) stenosis: Secondary | ICD-10-CM | POA: Diagnosis not present

## 2019-05-23 DIAGNOSIS — I110 Hypertensive heart disease with heart failure: Secondary | ICD-10-CM | POA: Diagnosis not present

## 2019-05-23 DIAGNOSIS — K922 Gastrointestinal hemorrhage, unspecified: Secondary | ICD-10-CM | POA: Diagnosis not present

## 2019-05-23 DIAGNOSIS — I459 Conduction disorder, unspecified: Secondary | ICD-10-CM | POA: Diagnosis not present

## 2019-05-23 DIAGNOSIS — C519 Malignant neoplasm of vulva, unspecified: Secondary | ICD-10-CM | POA: Diagnosis not present

## 2019-05-23 DIAGNOSIS — Z510 Encounter for antineoplastic radiation therapy: Secondary | ICD-10-CM | POA: Diagnosis not present

## 2019-05-23 NOTE — Telephone Encounter (Signed)
Attempted to call patient in regards to Cardiac Rehab - LM on VM 

## 2019-05-23 NOTE — Telephone Encounter (Signed)
Pt insurance is active and benefits verified through Medicare A/B Co-pay $0.00, DED $198.00/$198.00 met, out of pocket $0.00/$0.00 met, co-insurance 20%. No pre-authorization required. Passport, 05/23/2019 @ 1142AM, SKS#[id number]  Will contact patient to see if she is interested in the Cardiac Rehab Program. If interested, patient will need to complete follow up appt. Once completed, patient will be contacted for scheduling upon review by the RN Navigator.
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
## 2019-05-24 ENCOUNTER — Ambulatory Visit: Payer: Medicare Other

## 2019-05-24 ENCOUNTER — Ambulatory Visit
Admission: RE | Admit: 2019-05-24 | Discharge: 2019-05-24 | Disposition: A | Discharge status: Home / Self Care | Payer: Medicare Other | Source: Ambulatory Visit | Attending: Radiation Oncology | Admitting: Radiation Oncology

## 2019-05-24 ENCOUNTER — Other Ambulatory Visit: Payer: Self-pay

## 2019-05-24 ENCOUNTER — Encounter: Payer: Medicare Other | Admitting: Surgery

## 2019-05-24 ENCOUNTER — Ambulatory Visit: Payer: Medicare Other | Admitting: Physical Therapy

## 2019-05-24 DIAGNOSIS — I110 Hypertensive heart disease with heart failure: Secondary | ICD-10-CM | POA: Diagnosis not present

## 2019-05-24 DIAGNOSIS — Z510 Encounter for antineoplastic radiation therapy: Secondary | ICD-10-CM | POA: Diagnosis not present

## 2019-05-24 DIAGNOSIS — C519 Malignant neoplasm of vulva, unspecified: Secondary | ICD-10-CM | POA: Diagnosis not present

## 2019-05-24 DIAGNOSIS — D649 Anemia, unspecified: Secondary | ICD-10-CM | POA: Diagnosis not present

## 2019-05-24 DIAGNOSIS — K922 Gastrointestinal hemorrhage, unspecified: Secondary | ICD-10-CM | POA: Diagnosis not present

## 2019-05-24 DIAGNOSIS — D696 Thrombocytopenia, unspecified: Secondary | ICD-10-CM | POA: Diagnosis not present

## 2019-05-24 DIAGNOSIS — I350 Nonrheumatic aortic (valve) stenosis: Secondary | ICD-10-CM | POA: Diagnosis not present

## 2019-05-25 ENCOUNTER — Other Ambulatory Visit: Payer: Self-pay

## 2019-05-25 ENCOUNTER — Ambulatory Visit
Admission: RE | Admit: 2019-05-25 | Discharge: 2019-05-25 | Disposition: A | Discharge status: Home / Self Care | Payer: Medicare Other | Source: Ambulatory Visit | Attending: Radiation Oncology | Admitting: Radiation Oncology

## 2019-05-25 ENCOUNTER — Ambulatory Visit (INDEPENDENT_AMBULATORY_CARE_PROVIDER_SITE_OTHER): Payer: Medicare Other | Admitting: Physician Assistant

## 2019-05-25 ENCOUNTER — Ambulatory Visit: Payer: Medicare Other

## 2019-05-25 ENCOUNTER — Encounter: Payer: Self-pay | Admitting: Physician Assistant

## 2019-05-25 VITALS — BP 140/62 | HR 66 | Ht 64.0 in | Wt 185.0 lb

## 2019-05-25 DIAGNOSIS — E039 Hypothyroidism, unspecified: Secondary | ICD-10-CM | POA: Diagnosis not present

## 2019-05-25 DIAGNOSIS — C519 Malignant neoplasm of vulva, unspecified: Secondary | ICD-10-CM

## 2019-05-25 DIAGNOSIS — I5032 Chronic diastolic (congestive) heart failure: Secondary | ICD-10-CM | POA: Diagnosis not present

## 2019-05-25 DIAGNOSIS — Z510 Encounter for antineoplastic radiation therapy: Secondary | ICD-10-CM | POA: Diagnosis not present

## 2019-05-25 DIAGNOSIS — Z8719 Personal history of other diseases of the digestive system: Secondary | ICD-10-CM | POA: Diagnosis not present

## 2019-05-25 DIAGNOSIS — I459 Conduction disorder, unspecified: Secondary | ICD-10-CM

## 2019-05-25 DIAGNOSIS — Z952 Presence of prosthetic heart valve: Secondary | ICD-10-CM

## 2019-05-25 MED ORDER — AMOXICILLIN 500 MG PO TABS: ORAL_TABLET | ORAL | 3 refills | Status: AC

## 2019-05-25 MED ORDER — LEVOTHYROXINE SODIUM 137 MCG PO TABS: 125.0000 ug | ORAL_TABLET | Freq: Every day | ORAL | 2 refills | Status: DC

## 2019-05-25 MED ORDER — PANTOPRAZOLE SODIUM 40 MG PO TBEC: 40.0000 mg | DELAYED_RELEASE_TABLET | Freq: Every day | ORAL | 11 refills | Status: DC

## 2019-05-25 NOTE — Patient Instructions (Addendum)
Medication Instructions:  1) INCREASE LASIX to 40 mg daily for 2-3 days until your swelling resolves.  2) Your provider discussed the importance of taking an antibiotic prior to all dental visits to prevent damage to the heart valves from infection. You were given a prescription for AMOXIL 2,000mg  to take one hour prior to any dental appointment.  3) INCREASE SYNTHROID to 137 mcg daily.  4) STOP NEXIUM  5) START PROTONIX 40 mg daily  Labwork: TODAY: BMET, CBC  Follow-Up: Please keep your follow-up appointments!

## 2019-05-26 ENCOUNTER — Other Ambulatory Visit: Payer: Self-pay

## 2019-05-26 ENCOUNTER — Ambulatory Visit
Admission: RE | Admit: 2019-05-26 | Discharge: 2019-05-26 | Disposition: A | Discharge status: Home / Self Care | Payer: Medicare Other | Source: Ambulatory Visit | Attending: Radiation Oncology | Admitting: Radiation Oncology

## 2019-05-26 DIAGNOSIS — Z510 Encounter for antineoplastic radiation therapy: Secondary | ICD-10-CM | POA: Diagnosis not present

## 2019-05-26 DIAGNOSIS — C519 Malignant neoplasm of vulva, unspecified: Secondary | ICD-10-CM | POA: Diagnosis not present

## 2019-05-26 DIAGNOSIS — I350 Nonrheumatic aortic (valve) stenosis: Secondary | ICD-10-CM | POA: Diagnosis not present

## 2019-05-26 DIAGNOSIS — K922 Gastrointestinal hemorrhage, unspecified: Secondary | ICD-10-CM | POA: Diagnosis not present

## 2019-05-26 DIAGNOSIS — D649 Anemia, unspecified: Secondary | ICD-10-CM | POA: Diagnosis not present

## 2019-05-26 DIAGNOSIS — I110 Hypertensive heart disease with heart failure: Secondary | ICD-10-CM | POA: Diagnosis not present

## 2019-05-26 DIAGNOSIS — D696 Thrombocytopenia, unspecified: Secondary | ICD-10-CM | POA: Diagnosis not present

## 2019-05-26 LAB — CBC WITH DIFFERENTIAL/PLATELET
Basophils Absolute: 0 10*3/uL (ref 0.0–0.2)
Basos: 1 %
EOS (ABSOLUTE): 0.1 10*3/uL (ref 0.0–0.4)
Eos: 3 %
Hematocrit: 30.3 % — ABNORMAL LOW (ref 34.0–46.6)
Hemoglobin: 9.5 g/dL — ABNORMAL LOW (ref 11.1–15.9)
Immature Grans (Abs): 0 10*3/uL (ref 0.0–0.1)
Immature Granulocytes: 1 %
Lymphocytes Absolute: 0.2 10*3/uL — ABNORMAL LOW (ref 0.7–3.1)
Lymphs: 5 %
MCH: 28.4 pg (ref 26.6–33.0)
MCHC: 31.4 g/dL — ABNORMAL LOW (ref 31.5–35.7)
MCV: 91 fL (ref 79–97)
Monocytes Absolute: 0.4 10*3/uL (ref 0.1–0.9)
Monocytes: 11 %
Neutrophils Absolute: 2.8 10*3/uL (ref 1.4–7.0)
Neutrophils: 79 %
Platelets: 162 10*3/uL (ref 150–450)
RBC: 3.34 x10E6/uL — ABNORMAL LOW (ref 3.77–5.28)
RDW: 16.4 % — ABNORMAL HIGH (ref 11.7–15.4)
WBC: 3.5 10*3/uL (ref 3.4–10.8)

## 2019-05-26 LAB — BASIC METABOLIC PANEL
BUN/Creatinine Ratio: 15 (ref 12–28)
BUN: 10 mg/dL (ref 8–27)
CO2: 23 mmol/L (ref 20–29)
Calcium: 10.1 mg/dL (ref 8.7–10.3)
Chloride: 96 mmol/L (ref 96–106)
Creatinine, Ser: 0.66 mg/dL (ref 0.57–1.00)
GFR calc Af Amer: 93 mL/min/{1.73_m2} (ref >59)
GFR calc non Af Amer: 81 mL/min/{1.73_m2} (ref >59)
Glucose: 122 mg/dL — ABNORMAL HIGH (ref 65–99)
Potassium: 4.1 mmol/L (ref 3.5–5.2)
Sodium: 136 mmol/L (ref 134–144)

## 2019-05-27 DIAGNOSIS — I110 Hypertensive heart disease with heart failure: Secondary | ICD-10-CM | POA: Diagnosis not present

## 2019-05-27 DIAGNOSIS — D696 Thrombocytopenia, unspecified: Secondary | ICD-10-CM | POA: Diagnosis not present

## 2019-05-27 DIAGNOSIS — K922 Gastrointestinal hemorrhage, unspecified: Secondary | ICD-10-CM | POA: Diagnosis not present

## 2019-05-27 DIAGNOSIS — D649 Anemia, unspecified: Secondary | ICD-10-CM | POA: Diagnosis not present

## 2019-05-27 DIAGNOSIS — C519 Malignant neoplasm of vulva, unspecified: Secondary | ICD-10-CM | POA: Diagnosis not present

## 2019-05-27 DIAGNOSIS — I350 Nonrheumatic aortic (valve) stenosis: Secondary | ICD-10-CM | POA: Diagnosis not present

## 2019-05-29 ENCOUNTER — Ambulatory Visit
Admission: RE | Admit: 2019-05-29 | Discharge: 2019-05-29 | Disposition: A | Discharge status: Home / Self Care | Payer: Medicare Other | Source: Ambulatory Visit | Attending: Radiation Oncology | Admitting: Radiation Oncology

## 2019-05-29 ENCOUNTER — Other Ambulatory Visit: Payer: Self-pay

## 2019-05-29 DIAGNOSIS — C519 Malignant neoplasm of vulva, unspecified: Secondary | ICD-10-CM | POA: Diagnosis not present

## 2019-05-29 DIAGNOSIS — I110 Hypertensive heart disease with heart failure: Secondary | ICD-10-CM | POA: Diagnosis not present

## 2019-05-29 DIAGNOSIS — D649 Anemia, unspecified: Secondary | ICD-10-CM | POA: Diagnosis not present

## 2019-05-29 DIAGNOSIS — D696 Thrombocytopenia, unspecified: Secondary | ICD-10-CM | POA: Diagnosis not present

## 2019-05-29 DIAGNOSIS — K922 Gastrointestinal hemorrhage, unspecified: Secondary | ICD-10-CM | POA: Diagnosis not present

## 2019-05-29 DIAGNOSIS — I350 Nonrheumatic aortic (valve) stenosis: Secondary | ICD-10-CM | POA: Diagnosis not present

## 2019-05-29 DIAGNOSIS — Z510 Encounter for antineoplastic radiation therapy: Secondary | ICD-10-CM | POA: Diagnosis not present

## 2019-05-30 ENCOUNTER — Other Ambulatory Visit: Payer: Self-pay

## 2019-05-30 ENCOUNTER — Ambulatory Visit
Admission: RE | Admit: 2019-05-30 | Discharge: 2019-05-30 | Disposition: A | Discharge status: Home / Self Care | Payer: Medicare Other | Source: Ambulatory Visit | Attending: Radiation Oncology | Admitting: Radiation Oncology

## 2019-05-30 DIAGNOSIS — Z510 Encounter for antineoplastic radiation therapy: Secondary | ICD-10-CM | POA: Diagnosis not present

## 2019-05-30 DIAGNOSIS — I350 Nonrheumatic aortic (valve) stenosis: Secondary | ICD-10-CM | POA: Diagnosis not present

## 2019-05-30 DIAGNOSIS — C519 Malignant neoplasm of vulva, unspecified: Secondary | ICD-10-CM | POA: Diagnosis not present

## 2019-05-30 DIAGNOSIS — D696 Thrombocytopenia, unspecified: Secondary | ICD-10-CM | POA: Diagnosis not present

## 2019-05-30 DIAGNOSIS — I110 Hypertensive heart disease with heart failure: Secondary | ICD-10-CM | POA: Diagnosis not present

## 2019-05-30 DIAGNOSIS — K922 Gastrointestinal hemorrhage, unspecified: Secondary | ICD-10-CM | POA: Diagnosis not present

## 2019-05-30 DIAGNOSIS — D649 Anemia, unspecified: Secondary | ICD-10-CM | POA: Diagnosis not present

## 2019-05-31 ENCOUNTER — Other Ambulatory Visit: Payer: Self-pay | Admitting: *Deleted

## 2019-05-31 ENCOUNTER — Ambulatory Visit
Admission: RE | Admit: 2019-05-31 | Discharge: 2019-05-31 | Disposition: A | Discharge status: Home / Self Care | Payer: Medicare Other | Source: Ambulatory Visit | Attending: Radiation Oncology | Admitting: Radiation Oncology

## 2019-05-31 ENCOUNTER — Other Ambulatory Visit: Payer: Self-pay

## 2019-05-31 DIAGNOSIS — Z510 Encounter for antineoplastic radiation therapy: Secondary | ICD-10-CM | POA: Diagnosis not present

## 2019-05-31 DIAGNOSIS — I459 Conduction disorder, unspecified: Secondary | ICD-10-CM

## 2019-05-31 DIAGNOSIS — C519 Malignant neoplasm of vulva, unspecified: Secondary | ICD-10-CM | POA: Diagnosis not present

## 2019-06-01 ENCOUNTER — Other Ambulatory Visit: Payer: Self-pay

## 2019-06-01 ENCOUNTER — Encounter: Payer: Self-pay | Admitting: Radiation Oncology

## 2019-06-01 ENCOUNTER — Ambulatory Visit
Admission: RE | Admit: 2019-06-01 | Discharge: 2019-06-01 | Disposition: A | Discharge status: Home / Self Care | Payer: Medicare Other | Source: Ambulatory Visit | Attending: Radiation Oncology | Admitting: Radiation Oncology

## 2019-06-01 DIAGNOSIS — Z510 Encounter for antineoplastic radiation therapy: Secondary | ICD-10-CM | POA: Diagnosis not present

## 2019-06-01 DIAGNOSIS — C519 Malignant neoplasm of vulva, unspecified: Secondary | ICD-10-CM | POA: Diagnosis not present

## 2019-06-01 MED ORDER — CLOPIDOGREL BISULFATE 75 MG PO TABS: 75.0000 mg | ORAL_TABLET | Freq: Every day | ORAL | 1 refills | Status: DC

## 2019-06-02 ENCOUNTER — Telehealth: Payer: Self-pay | Admitting: Physician Assistant

## 2019-06-02 DIAGNOSIS — C519 Malignant neoplasm of vulva, unspecified: Secondary | ICD-10-CM | POA: Diagnosis not present

## 2019-06-02 DIAGNOSIS — I110 Hypertensive heart disease with heart failure: Secondary | ICD-10-CM | POA: Diagnosis not present

## 2019-06-02 DIAGNOSIS — K922 Gastrointestinal hemorrhage, unspecified: Secondary | ICD-10-CM | POA: Diagnosis not present

## 2019-06-02 DIAGNOSIS — D649 Anemia, unspecified: Secondary | ICD-10-CM | POA: Diagnosis not present

## 2019-06-02 DIAGNOSIS — I350 Nonrheumatic aortic (valve) stenosis: Secondary | ICD-10-CM | POA: Diagnosis not present

## 2019-06-02 DIAGNOSIS — D696 Thrombocytopenia, unspecified: Secondary | ICD-10-CM | POA: Diagnosis not present

## 2019-06-02 MED ORDER — FUROSEMIDE 40 MG PO TABS: 40.0000 mg | ORAL_TABLET | Freq: Every day | ORAL | Status: DC

## 2019-06-02 NOTE — Telephone Encounter (Signed)
Spoke with Hailey Cobb and the patient. Instructed her to increase Lasix to 40 mg daily. Confirmed visits scheduled next week. They were grateful for assistance.

## 2019-06-02 NOTE — Telephone Encounter (Signed)
Per Kathlene November, PA pt should have BMP on Monday.  I spoke with home health nurse who is currently with pt and she will draw BMP on Monday

## 2019-06-02 NOTE — Telephone Encounter (Signed)
New message:     Timmothy Sours PT calling stating that the patent has gain weight over a few days 10/06 07 and 09 188.0 10/10 185.2, 10/12 184.0, 10/13 181.0, and today 188.0 and patient is having some SOB.

## 2019-06-02 NOTE — Telephone Encounter (Signed)
I spoke with Timmothy Sours (OT) who reports the following weights.  These are taken by clinician when seeing pt.  Pt does not weigh at home.  No weights available for 10/14 and 10/15.  Lasix was increased for a few days after last office visit with K. Grandville Silos, PA  Pt has now returned to regular dose of Lasix 20 mg daily.  Timmothy Sours reports pt does have shortness of breath but it has not worsened recently Timmothy Sours requests we call him and pt back with recommendations from MD. Will forward to Dr Burt Knack

## 2019-06-02 NOTE — Telephone Encounter (Signed)
Sounds like she should go back to lasix 40 mg daily and maintain that dose. thanks

## 2019-06-05 DIAGNOSIS — K922 Gastrointestinal hemorrhage, unspecified: Secondary | ICD-10-CM | POA: Diagnosis not present

## 2019-06-05 DIAGNOSIS — I110 Hypertensive heart disease with heart failure: Secondary | ICD-10-CM | POA: Diagnosis not present

## 2019-06-05 DIAGNOSIS — C519 Malignant neoplasm of vulva, unspecified: Secondary | ICD-10-CM | POA: Diagnosis not present

## 2019-06-05 DIAGNOSIS — D649 Anemia, unspecified: Secondary | ICD-10-CM | POA: Diagnosis not present

## 2019-06-05 DIAGNOSIS — I350 Nonrheumatic aortic (valve) stenosis: Secondary | ICD-10-CM | POA: Diagnosis not present

## 2019-06-05 DIAGNOSIS — D696 Thrombocytopenia, unspecified: Secondary | ICD-10-CM | POA: Diagnosis not present

## 2019-06-05 DIAGNOSIS — I5033 Acute on chronic diastolic (congestive) heart failure: Secondary | ICD-10-CM | POA: Diagnosis not present

## 2019-06-06 DIAGNOSIS — I110 Hypertensive heart disease with heart failure: Secondary | ICD-10-CM | POA: Diagnosis not present

## 2019-06-06 DIAGNOSIS — K922 Gastrointestinal hemorrhage, unspecified: Secondary | ICD-10-CM | POA: Diagnosis not present

## 2019-06-06 DIAGNOSIS — D696 Thrombocytopenia, unspecified: Secondary | ICD-10-CM | POA: Diagnosis not present

## 2019-06-06 DIAGNOSIS — D649 Anemia, unspecified: Secondary | ICD-10-CM | POA: Diagnosis not present

## 2019-06-06 DIAGNOSIS — I350 Nonrheumatic aortic (valve) stenosis: Secondary | ICD-10-CM | POA: Diagnosis not present

## 2019-06-06 DIAGNOSIS — C519 Malignant neoplasm of vulva, unspecified: Secondary | ICD-10-CM | POA: Diagnosis not present

## 2019-06-07 ENCOUNTER — Other Ambulatory Visit: Payer: Self-pay | Admitting: *Deleted

## 2019-06-07 MED ORDER — FUROSEMIDE 40 MG PO TABS: 40.0000 mg | ORAL_TABLET | Freq: Every day | ORAL | 3 refills | Status: DC

## 2019-06-07 MED ORDER — PANTOPRAZOLE SODIUM 40 MG PO TBEC: 40.0000 mg | DELAYED_RELEASE_TABLET | Freq: Every day | ORAL | 3 refills | Status: DC

## 2019-06-08 ENCOUNTER — Ambulatory Visit (INDEPENDENT_AMBULATORY_CARE_PROVIDER_SITE_OTHER): Payer: Medicare Other | Admitting: Physician Assistant

## 2019-06-08 ENCOUNTER — Encounter: Payer: Self-pay | Admitting: Physician Assistant

## 2019-06-08 ENCOUNTER — Other Ambulatory Visit: Payer: Self-pay

## 2019-06-08 ENCOUNTER — Other Ambulatory Visit: Payer: Self-pay | Admitting: Physician Assistant

## 2019-06-08 ENCOUNTER — Ambulatory Visit (HOSPITAL_COMMUNITY): Payer: Medicare Other | Attending: Cardiology

## 2019-06-08 VITALS — BP 144/74 | HR 58 | Ht 64.0 in | Wt 183.0 lb

## 2019-06-08 DIAGNOSIS — I6320 Cerebral infarction due to unspecified occlusion or stenosis of unspecified precerebral arteries: Secondary | ICD-10-CM | POA: Diagnosis not present

## 2019-06-08 DIAGNOSIS — D630 Anemia in neoplastic disease: Secondary | ICD-10-CM | POA: Diagnosis not present

## 2019-06-08 DIAGNOSIS — I2510 Atherosclerotic heart disease of native coronary artery without angina pectoris: Secondary | ICD-10-CM | POA: Diagnosis not present

## 2019-06-08 DIAGNOSIS — I440 Atrioventricular block, first degree: Secondary | ICD-10-CM | POA: Diagnosis not present

## 2019-06-08 DIAGNOSIS — Z8719 Personal history of other diseases of the digestive system: Secondary | ICD-10-CM

## 2019-06-08 DIAGNOSIS — E039 Hypothyroidism, unspecified: Secondary | ICD-10-CM

## 2019-06-08 DIAGNOSIS — I0981 Rheumatic heart failure: Secondary | ICD-10-CM | POA: Diagnosis not present

## 2019-06-08 DIAGNOSIS — G319 Degenerative disease of nervous system, unspecified: Secondary | ICD-10-CM | POA: Diagnosis not present

## 2019-06-08 DIAGNOSIS — Z48812 Encounter for surgical aftercare following surgery on the circulatory system: Secondary | ICD-10-CM | POA: Diagnosis not present

## 2019-06-08 DIAGNOSIS — I10 Essential (primary) hypertension: Secondary | ICD-10-CM | POA: Diagnosis not present

## 2019-06-08 DIAGNOSIS — K754 Autoimmune hepatitis: Secondary | ICD-10-CM | POA: Diagnosis not present

## 2019-06-08 DIAGNOSIS — C519 Malignant neoplasm of vulva, unspecified: Secondary | ICD-10-CM | POA: Diagnosis not present

## 2019-06-08 DIAGNOSIS — J3801 Paralysis of vocal cords and larynx, unilateral: Secondary | ICD-10-CM | POA: Diagnosis not present

## 2019-06-08 DIAGNOSIS — Z952 Presence of prosthetic heart valve: Secondary | ICD-10-CM | POA: Insufficient documentation

## 2019-06-08 DIAGNOSIS — I248 Other forms of acute ischemic heart disease: Secondary | ICD-10-CM | POA: Diagnosis not present

## 2019-06-08 DIAGNOSIS — I4510 Unspecified right bundle-branch block: Secondary | ICD-10-CM | POA: Diagnosis not present

## 2019-06-08 DIAGNOSIS — I459 Conduction disorder, unspecified: Secondary | ICD-10-CM

## 2019-06-08 DIAGNOSIS — E119 Type 2 diabetes mellitus without complications: Secondary | ICD-10-CM | POA: Diagnosis not present

## 2019-06-08 DIAGNOSIS — I5033 Acute on chronic diastolic (congestive) heart failure: Secondary | ICD-10-CM | POA: Diagnosis not present

## 2019-06-08 DIAGNOSIS — I5032 Chronic diastolic (congestive) heart failure: Secondary | ICD-10-CM | POA: Diagnosis not present

## 2019-06-08 DIAGNOSIS — S065X0S Traumatic subdural hemorrhage without loss of consciousness, sequela: Secondary | ICD-10-CM | POA: Diagnosis not present

## 2019-06-08 DIAGNOSIS — I700 Atherosclerosis of aorta: Secondary | ICD-10-CM | POA: Diagnosis not present

## 2019-06-08 DIAGNOSIS — E890 Postprocedural hypothyroidism: Secondary | ICD-10-CM | POA: Diagnosis not present

## 2019-06-08 DIAGNOSIS — I495 Sick sinus syndrome: Secondary | ICD-10-CM | POA: Diagnosis not present

## 2019-06-08 DIAGNOSIS — I110 Hypertensive heart disease with heart failure: Secondary | ICD-10-CM | POA: Diagnosis not present

## 2019-06-08 DIAGNOSIS — D696 Thrombocytopenia, unspecified: Secondary | ICD-10-CM | POA: Diagnosis not present

## 2019-06-08 DIAGNOSIS — D62 Acute posthemorrhagic anemia: Secondary | ICD-10-CM | POA: Diagnosis not present

## 2019-06-08 DIAGNOSIS — G40909 Epilepsy, unspecified, not intractable, without status epilepticus: Secondary | ICD-10-CM | POA: Diagnosis not present

## 2019-06-08 DIAGNOSIS — I708 Atherosclerosis of other arteries: Secondary | ICD-10-CM | POA: Diagnosis not present

## 2019-06-08 DIAGNOSIS — I051 Rheumatic mitral insufficiency: Secondary | ICD-10-CM | POA: Diagnosis not present

## 2019-06-08 DIAGNOSIS — I471 Supraventricular tachycardia: Secondary | ICD-10-CM | POA: Diagnosis not present

## 2019-06-08 DIAGNOSIS — I444 Left anterior fascicular block: Secondary | ICD-10-CM | POA: Diagnosis not present

## 2019-06-08 NOTE — Patient Instructions (Addendum)
Medication Instructions:  1) You may discontinue Plavix after 11/16/2019  *If you need a refill on your cardiac medications before your next appointment, please call your pharmacy*  Lab Work: None If you have labs (blood work) drawn today and your tests are completely normal, you will receive your results only by: Marland Kitchen MyChart Message (if you have MyChart) OR . A paper copy in the mail If you have any lab test that is abnormal or we need to change your treatment, we will call you to review the results.  Testing/Procedures: None  Follow-Up: At [facility], you and your health needs are our priority.  As part of our continuing mission to provide you with exceptional heart care, we have created designated Provider Care Teams.  These Care Teams include your primary Cardiologist (physician) and Advanced Practice Providers (APPs -  Physician Assistants and Nurse Practitioners) who all work together to provide you with the care you need, when you need it.  Your next appointment:   12 months   The format for your next appointment:   In Person  Provider:   Nell Range, PA-C  Other Instructions  Continue to monitor your blood pressure and let us know if it is consistently 140/90 or higher.
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
## 2019-06-09 DIAGNOSIS — E113212 Type 2 diabetes mellitus with mild nonproliferative diabetic retinopathy with macular edema, left eye: Secondary | ICD-10-CM | POA: Diagnosis not present

## 2019-06-09 DIAGNOSIS — H35371 Puckering of macula, right eye: Secondary | ICD-10-CM | POA: Diagnosis not present

## 2019-06-09 DIAGNOSIS — E113291 Type 2 diabetes mellitus with mild nonproliferative diabetic retinopathy without macular edema, right eye: Secondary | ICD-10-CM | POA: Diagnosis not present

## 2019-06-09 DIAGNOSIS — H15833 Staphyloma posticum, bilateral: Secondary | ICD-10-CM | POA: Diagnosis not present

## 2019-06-14 DIAGNOSIS — I5033 Acute on chronic diastolic (congestive) heart failure: Secondary | ICD-10-CM | POA: Diagnosis not present

## 2019-06-14 DIAGNOSIS — I110 Hypertensive heart disease with heart failure: Secondary | ICD-10-CM | POA: Diagnosis not present

## 2019-06-14 DIAGNOSIS — Z48812 Encounter for surgical aftercare following surgery on the circulatory system: Secondary | ICD-10-CM | POA: Diagnosis not present

## 2019-06-14 DIAGNOSIS — I0981 Rheumatic heart failure: Secondary | ICD-10-CM | POA: Diagnosis not present

## 2019-06-14 DIAGNOSIS — I051 Rheumatic mitral insufficiency: Secondary | ICD-10-CM | POA: Diagnosis not present

## 2019-06-14 DIAGNOSIS — C519 Malignant neoplasm of vulva, unspecified: Secondary | ICD-10-CM | POA: Diagnosis not present

## 2019-06-19 NOTE — Addendum Note (Signed)
Addended by: Rose Phi on: 06/19/2019 08:12 AM   Modules accepted: Orders

## 2019-06-21 DIAGNOSIS — I4510 Unspecified right bundle-branch block: Secondary | ICD-10-CM | POA: Diagnosis not present

## 2019-06-21 DIAGNOSIS — E11319 Type 2 diabetes mellitus with unspecified diabetic retinopathy without macular edema: Secondary | ICD-10-CM | POA: Diagnosis not present

## 2019-06-21 DIAGNOSIS — Z7984 Long term (current) use of oral hypoglycemic drugs: Secondary | ICD-10-CM | POA: Diagnosis not present

## 2019-06-21 DIAGNOSIS — Z952 Presence of prosthetic heart valve: Secondary | ICD-10-CM | POA: Diagnosis not present

## 2019-06-21 DIAGNOSIS — R899 Unspecified abnormal finding in specimens from other organs, systems and tissues: Secondary | ICD-10-CM | POA: Diagnosis not present

## 2019-06-21 DIAGNOSIS — E039 Hypothyroidism, unspecified: Secondary | ICD-10-CM | POA: Diagnosis not present

## 2019-06-21 DIAGNOSIS — Z8544 Personal history of malignant neoplasm of other female genital organs: Secondary | ICD-10-CM | POA: Diagnosis not present

## 2019-06-21 DIAGNOSIS — D500 Iron deficiency anemia secondary to blood loss (chronic): Secondary | ICD-10-CM | POA: Diagnosis not present

## 2019-06-22 ENCOUNTER — Telehealth: Payer: Self-pay

## 2019-06-22 NOTE — Telephone Encounter (Signed)
-----   Message from Sherren Mocha, MD sent at 06/22/2019  8:17 AM EST ----- Monitor reviewed. Continue same Rx. No prolonged arrhythmia or bradycardic events noted.

## 2019-06-22 NOTE — Telephone Encounter (Signed)
Reviewed results with patient's DPR who verbalized understanding.   DPR reports the patient is no longer receiving home PT and would like to attend Cardiac Rehab. Spoke with Nell Range, PA, who gave OK to order Cardiac Rehab.

## 2019-06-23 ENCOUNTER — Telehealth (HOSPITAL_COMMUNITY): Payer: Self-pay

## 2019-06-23 ENCOUNTER — Encounter (HOSPITAL_COMMUNITY): Payer: Self-pay

## 2019-06-23 NOTE — Telephone Encounter (Signed)
Pt insurance is active and benefits verified through Medicare A/B. Co-pay $0.00, DED $198.00/$198.00 met, out of pocket $0.00/$0.00 met, co-insurance 20%. No pre-authorization required. Passport, 06/23/2019 @ 2:02PM, ZTI#[id number]

## 2019-06-23 NOTE — Telephone Encounter (Signed)
Attempted to call patient in regards to Cardiac Rehab - LM on VM Mailed letter 

## 2019-06-26 NOTE — Progress Notes (Incomplete)
°  Patient Name: Hailey Cobb MRN: [id number] DOB: [birth year] Referring Physician: Hulan Fess (Profile Not Attached) Date of Service: 06/01/2019  [facility] [place]                                                        End Of Treatment Note  Diagnoses: C51.9-Malignant neoplasm of vulva, unspecified  Cancer Staging: clinical stage IB squamous carcinoma  Intent: Curative  Radiation Treatment Dates: 04/05/2019 through 06/01/2019 Site Technique Total Dose (Gy) Dose per Fx (Gy) Completed Fx Beam Energies  Pelvis: Pelvis IMRT 50.4/50.4 1.8 28/28 10X  Pelvis: Pelvis_Bst IMRT 14/14 2 7/7 10X   Narrative: The patient tolerated radiation therapy relatively well. She experienced some dysuria and diarrhea with black and tarry stools. She denied pain, hematuria, vaginal discharge or bleeding, and rectal bleeding. She reported severe fatigue. The lesion was noted to be significantly smaller by the end of treatment.  Plan: The patient will follow-up with radiation oncology in 1 month.  ________________________________________________   Blair Promise, PhD, MD  This document serves as a record of services personally performed by Gery Pray, MD. It was created on his behalf by Wilburn Mylar, a trained medical scribe. The creation of this record is based on the scribe's personal observations and the provider's statements to them. This document has been checked and approved by the attending provider.

## 2019-06-30 ENCOUNTER — Telehealth (HOSPITAL_COMMUNITY): Payer: Self-pay

## 2019-06-30 NOTE — Telephone Encounter (Signed)
Pt daughter Hailey Cobb called and stated pt was interested in participating in the Cardiac Rehab Program. Patient stated yes. Patient will come in for orientation on 08/01/2019 @ 10AM and will attend the 11AM exercise class.  Tourist information centre manager.

## 2019-07-04 ENCOUNTER — Other Ambulatory Visit (HOSPITAL_COMMUNITY): Payer: Medicare Other

## 2019-07-05 ENCOUNTER — Telehealth (HOSPITAL_COMMUNITY): Payer: Self-pay | Admitting: *Deleted

## 2019-07-05 NOTE — Telephone Encounter (Signed)
-----   Message from Gery Pray, MD sent at 07/05/2019  8:30 AM EST ----- Regarding: RE: Ok to participate in group exercise at [facility] with me. Thanks, jk ----- Message ----- From: Rowe Pavy, RN Sent: 07/03/2019   1:35 PM EST To: Gery Pray, MD Subject: Ok to participate in group exercise at [facility]   Dr. Sondra Come,  The above mutual pt is eligible to participate in group exercise at Cardiac Rehab s/p  05/11/19 TAVR.  She has completed her cardiology follow up and wishes to proceed with scheduling.  A great deal of planning with advisement from our Medical Director - Dr. Radford Pax, CV Service line leadership, Infection Disease Control, Facilities, security, recommendations from American Association of Cardiac and Pulmonary Rehab (AACVPR) with the goal for optimal patient safety. Patients will have strict guidelines and criteria they must adhere to and follow. Patients will wear a mask during exercise and practice social distancing. Patients will have to complete screening prior to entry into gym area.  Ms. Morash has a COVID-19 risk score of 8 Do you feel this patient is appropriate to exercise in  a group setting in  cardiac rehab from oncology view?   Any additional restrictions you feel are appropriate for this patient? Next follow up  with you is on 11/30.  Pt tentatively scheduled for 12/15.   Thank you and we appreciate your input Maurice Small RN, BSN Cardiac and Pulmonary Rehab Nurse Navigator    Cardiac Rehab Staff

## 2019-07-06 DIAGNOSIS — M5136 Other intervertebral disc degeneration, lumbar region: Secondary | ICD-10-CM | POA: Diagnosis not present

## 2019-07-06 DIAGNOSIS — M9904 Segmental and somatic dysfunction of sacral region: Secondary | ICD-10-CM | POA: Diagnosis not present

## 2019-07-06 DIAGNOSIS — M9903 Segmental and somatic dysfunction of lumbar region: Secondary | ICD-10-CM | POA: Diagnosis not present

## 2019-07-06 DIAGNOSIS — M9905 Segmental and somatic dysfunction of pelvic region: Secondary | ICD-10-CM | POA: Diagnosis not present

## 2019-07-07 ENCOUNTER — Telehealth: Payer: Self-pay

## 2019-07-07 NOTE — Telephone Encounter (Signed)
pt contacted this RN re: possible yeast infection. Conveyed to pt that this RN would need to discuss with Gyn Onc as Dr. Sondra Come is out of the office on Fridays. Contacted pt with recommendations from Heywood Iles, NP to use cream for yeast infection externally since pt was unable to insert vaginal cream dispenser into vagina. Pt verbalized understanding and agreement. Pt to contact this RN Monday with report on how pt is doing. Loma Sousa, RN BSN

## 2019-07-17 ENCOUNTER — Ambulatory Visit
Admission: RE | Admit: 2019-07-17 | Discharge: 2019-07-17 | Disposition: A | Discharge status: Home / Self Care | Payer: Medicare Other | Source: Ambulatory Visit | Attending: Radiation Oncology | Admitting: Radiation Oncology

## 2019-07-17 ENCOUNTER — Encounter: Payer: Self-pay | Admitting: Radiation Oncology

## 2019-07-17 ENCOUNTER — Other Ambulatory Visit: Payer: Self-pay

## 2019-07-17 VITALS — BP 134/57 | HR 74 | Temp 97.8°F | Resp 20 | Wt 180.2 lb

## 2019-07-17 DIAGNOSIS — Z923 Personal history of irradiation: Secondary | ICD-10-CM | POA: Diagnosis not present

## 2019-07-17 DIAGNOSIS — Z7984 Long term (current) use of oral hypoglycemic drugs: Secondary | ICD-10-CM | POA: Diagnosis not present

## 2019-07-17 DIAGNOSIS — R609 Edema, unspecified: Secondary | ICD-10-CM | POA: Diagnosis not present

## 2019-07-17 DIAGNOSIS — C519 Malignant neoplasm of vulva, unspecified: Secondary | ICD-10-CM | POA: Insufficient documentation

## 2019-07-17 DIAGNOSIS — Z79899 Other long term (current) drug therapy: Secondary | ICD-10-CM | POA: Diagnosis not present

## 2019-07-17 DIAGNOSIS — B373 Candidiasis of vulva and vagina: Secondary | ICD-10-CM | POA: Diagnosis not present

## 2019-07-17 MED ORDER — FLUCONAZOLE 150 MG PO TABS: 150.0000 mg | ORAL_TABLET | Freq: Every day | ORAL | 0 refills | Status: DC

## 2019-07-17 NOTE — Progress Notes (Signed)
Radiation Oncology         [phone] ________________________________  Name: Hailey Cobb MRN: [id number]  Date: 07/17/2019  DOB: 06/23/1932  Follow-Up Visit Note  CC: Hulan Fess, MD  Hulan Fess, MD    ICD-10-CM   1. 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  Radiation Treatment Dates: 04/05/2019 through 06/01/2019 Site Technique Total Dose (Gy) Dose per Fx (Gy) Completed Fx Beam Energies  Pelvis: Pelvis IMRT 50.4/50.4 1.8 28/28 10X  Pelvis: Pelvis_Bst IMRT 14/14 2 7/7 10X    Narrative:  The patient returns today for routine follow-up. Since end of treatment, she had followed up with Angelena Form, cardiology PA, on 06/08/2019. At that time, she had some mild lower extremity edema but was not taking Lasix due to vulvar irritation from radiation. Patient has been participating in group exercises at Cardiac Rehab.  On review of systems, she reports whitish vaginal discharge.  She is concerned about a yeast infection. She denies vaginal bleeding and all other symptoms.                 ALLERGIES:  is allergic to metoprolol; clonidine derivatives; glimepiride; and invokana [canagliflozin].  Meds: Current Outpatient Medications  Medication Sig Dispense Refill  . ALPRAZolam (XANAX) 0.5 MG tablet Take 0.5 mg by mouth See admin instructions. Take 0.5 mg by mouth daily at bedtime and may also take an additional 0.5 mg during the day as needed for anxiety    . amLODipine (NORVASC) 10 MG tablet Take 10 mg by mouth daily.    Marland Kitchen amoxicillin (AMOXIL) 500 MG tablet Take 4 capsules (2,000mg ) one hour prior to all dental visits. 8 tablet 3  . aspirin 81 MG chewable tablet Chew 81 mg by mouth daily.    . clopidogrel (PLAVIX) 75 MG tablet Take 1 tablet (75 mg total) by mouth daily. 90 tablet 1  . diphenoxylate-atropine (LOMOTIL) 2.5-0.025 MG tablet Take 1 tablet by  mouth 4 (four) times daily as needed for diarrhea or loose stools. 15 tablet 0  . doxazosin (CARDURA) 2 MG tablet Take 4-8 mg by mouth See admin instructions. Take 4 mg by mouth in the morning and 8 mg in the evening    . furosemide (LASIX) 40 MG tablet Take 1 tablet (40 mg total) by mouth daily. 90 tablet 3  . HYDROcodone-acetaminophen (NORCO/VICODIN) 5-325 MG tablet Take 1-2 tablets by mouth every 4 (four) hours as needed for severe pain. Do not take and drive 30 tablet 0  . irbesartan (AVAPRO) 150 MG tablet Take 150 mg by mouth 2 (two) times daily.    Marland Kitchen levETIRAcetam (KEPPRA) 500 MG tablet Take 500 mg by mouth daily.    Marland Kitchen levothyroxine (SYNTHROID) 137 MCG tablet Take 1 tablet (137 mcg total) by mouth daily before breakfast. 30 tablet 2  . metFORMIN (GLUCOPHAGE) 1000 MG tablet Take 1,000 mg by mouth 2 (two) times daily with a meal.    . OVER THE COUNTER MEDICATION Take 3 tablets by mouth daily. Thymic factor vitamins    . pantoprazole (PROTONIX) 40 MG tablet Take 1 tablet (40 mg total) by mouth daily. 90 tablet 3  . polyvinyl alcohol (LIQUIFILM TEARS) 1.4 % ophthalmic solution Place 1 drop into both eyes every 6 (six) hours as needed for dry eyes.    . rosuvastatin (CRESTOR) 20 MG tablet Take 20 mg by mouth See admin  instructions. Take 20 mg by mouth at bedtime only on Mon/Wed/Fri nights    . sitaGLIPtin (JANUVIA) 100 MG tablet Take 100 mg by mouth daily.     . Azelastine HCl (ASTEPRO) 0.15 % SOLN Place 2 sprays into both nostrils at bedtime.      No current facility-administered medications for this encounter.     Physical Findings: The patient is in no acute distress. Patient is alert and oriented.  weight is 180 lb 3.2 oz (81.7 kg). Her temperature is 97.8 F (36.6 C). Her blood pressure is 134/57 (abnormal) and her pulse is 74. Her respiration is 20 and oxygen saturation is 99%. .   Lungs are clear to auscultation bilaterally. Heart has regular rate and rhythm. No palpable cervical,  supraclavicular, or axillary adenopathy. Abdomen soft, non-tender, normal bowel sounds.  On pelvic exam the patient skin is healed well.  Has some edema in the labia minora bilaterally.  Careful examination of the presentation site reveals no obvious residual tumor.  A speculum exam was performed using a small speculum.  There is whitish discharge in the vaginal vault consistent with a yeast infection  Lab Findings: Lab Results  Component Value Date   WBC 3.5 05/25/2019   HGB 9.5 (L) 05/25/2019   HCT 30.3 (L) 05/25/2019   MCV 91 05/25/2019   PLT 162 05/25/2019    Radiographic Findings: No results found.  Impression:  The patient is recovering from the effects of radiation. Clinically stable.  She appears to have had excellent clinical response to her radiation therapy.  Plan: Patient will return for routine follow-up in 5 months.  Patient will follow up with Dr. Denman George in 2 months for detailed examination.  Patient will be placed on Diflucan for her vaginal candidiasis.  The patient will be given a vaginal dilator with instructions on its use in light of her pelvic radiation therapy.  ____________________________________   Blair Promise, PhD, MD  This document serves as a record of services personally performed by Gery Pray, MD. It was created on his behalf by Clerance Lav, a trained medical scribe. The creation of this record is based on the scribe's personal observations and the provider's statements to them. This document has been checked and approved by the attending provider.

## 2019-07-17 NOTE — Progress Notes (Signed)
Pt presents today for one month f/u with Dr. Sondra Come. Pt denies c/o pain. Pt denies dysuria/hematuria. Pt reports frequent vaginal burning/itching. Pt reports external cream for yeast helped "some". Pt denies vaginal bleeding/discharge. Pt denies rectal bleeding. Pt reports diarrhea from sugar free brownies. Pt reports abdominal bloating. Pt denies N/V.  BP (!) 134/57 (BP Location: Left Arm, Patient Position: Sitting, Cuff Size: Normal)   Pulse 74   Temp 97.8 F (36.6 C)   Resp 20   Wt 180 lb 3.2 oz (81.7 kg)   LMP  (LMP Unknown)   SpO2 99%   BMI 30.93 kg/m   Wt Readings from Last 3 Encounters:  07/17/19 180 lb 3.2 oz (81.7 kg)  06/08/19 183 lb (83 kg)  05/25/19 185 lb (83.9 kg)   Loma Sousa, RN BSN

## 2019-07-17 NOTE — Patient Instructions (Signed)
Coronavirus (COVID-19) Are you at risk?  Are you at risk for the Coronavirus (COVID-19)?  To be considered HIGH RISK for Coronavirus (COVID-19), you have to meet the following criteria:  . Traveled to [place], [place], [place], [place] or [place]; or in the [place] to [place], [place], [place], or [place]; and have fever, cough, and shortness of breath within the last 2 weeks of travel OR . Been in close contact with a person diagnosed with COVID-19 within the last 2 weeks and have fever, cough, and shortness of breath . IF YOU DO NOT MEET THESE CRITERIA, YOU ARE CONSIDERED LOW RISK FOR COVID-19.  What to do if you are HIGH RISK for COVID-19?  . If you are having a medical emergency, call 911. . Seek medical care right away. Before you go to a doctor's office, urgent care or emergency department, call ahead and tell them about your recent travel, contact with someone diagnosed with COVID-19, and your symptoms. You should receive instructions from your physician's office regarding next steps of care.  . When you arrive at healthcare provider, tell the healthcare staff immediately you have returned from visiting [place], [place], [place], [place] or [place]; or traveled in the [place] to [place], [place], [place], or [place]; in the last two weeks or you have been in close contact with a person diagnosed with COVID-19 in the last 2 weeks.   . Tell the health care staff about your symptoms: fever, cough and shortness of breath. . After you have been seen by a medical provider, you will be either: o Tested for (COVID-19) and discharged home on quarantine except to seek medical care if symptoms worsen, and asked to  - Stay home and avoid contact with others until you get your results (4-5 days)  - Avoid travel on public transportation if possible (such as bus, train, or airplane) or o Sent to the Emergency Department by EMS for evaluation, COVID-19 testing, and possible  admission depending on your condition and test results.  What to do if you are LOW RISK for COVID-19?  Reduce your risk of any infection by using the same precautions used for avoiding the common cold or flu:  . Wash your hands often with soap and warm water for at least 20 seconds.  If soap and water are not readily available, use an alcohol-based hand sanitizer with at least 60% alcohol.  . If coughing or sneezing, cover your mouth and nose by coughing or sneezing into the elbow areas of your shirt or coat, into a tissue or into your sleeve (not your hands). . Avoid shaking hands with others and consider head nods or verbal greetings only. . Avoid touching your eyes, nose, or mouth with unwashed hands.  . Avoid close contact with people who are sick. . Avoid places or events with large numbers of people in one location, like concerts or sporting events. . Carefully consider travel plans you have or are making. . If you are planning any travel outside or inside the [place], visit the CDC's Travelers' Health webpage for the latest health notices. . If you have some symptoms but not all symptoms, continue to monitor at home and seek medical attention if your symptoms worsen. . If you are having a medical emergency, call 911.   ADDITIONAL HEALTHCARE OPTIONS FOR PATIENTS  [facility] / e-Visit: [email or web address]         [facility]: [phone]  [facility]: [phone]                   [facility]: [phone]   

## 2019-07-18 ENCOUNTER — Encounter: Payer: Self-pay | Admitting: *Deleted

## 2019-07-18 ENCOUNTER — Telehealth: Payer: Self-pay

## 2019-07-18 NOTE — Telephone Encounter (Signed)
Informed patient that medication was called into the pharmacy ad was ready for pick up

## 2019-07-19 DIAGNOSIS — Z952 Presence of prosthetic heart valve: Secondary | ICD-10-CM | POA: Diagnosis not present

## 2019-07-19 DIAGNOSIS — I4510 Unspecified right bundle-branch block: Secondary | ICD-10-CM | POA: Diagnosis not present

## 2019-07-19 DIAGNOSIS — Z8544 Personal history of malignant neoplasm of other female genital organs: Secondary | ICD-10-CM | POA: Diagnosis not present

## 2019-07-19 DIAGNOSIS — E039 Hypothyroidism, unspecified: Secondary | ICD-10-CM | POA: Diagnosis not present

## 2019-07-19 DIAGNOSIS — R899 Unspecified abnormal finding in specimens from other organs, systems and tissues: Secondary | ICD-10-CM | POA: Diagnosis not present

## 2019-07-19 DIAGNOSIS — D500 Iron deficiency anemia secondary to blood loss (chronic): Secondary | ICD-10-CM | POA: Diagnosis not present

## 2019-07-19 DIAGNOSIS — E11319 Type 2 diabetes mellitus with unspecified diabetic retinopathy without macular edema: Secondary | ICD-10-CM | POA: Diagnosis not present

## 2019-07-21 ENCOUNTER — Telehealth (HOSPITAL_COMMUNITY): Payer: Self-pay | Admitting: Pharmacist

## 2019-07-21 NOTE — Telephone Encounter (Deleted)
Cardiac Rehab Medication Review by a Pharmacist  Does the patient  feel that his/her medications are working for him/her?  yes  Has the patient been experiencing any side effects to the medications prescribed?  no  Does the patient measure his/her own blood pressure or blood glucose at home?  Yes   Does the patient have any problems obtaining medications due to transportation or finances?   no  Understanding of regimen: good Understanding of indications: good Potential of compliance: good    Pharmacist comments:     Werner Lean 07/21/2019 5:17 PM

## 2019-07-26 ENCOUNTER — Telehealth: Payer: Self-pay | Admitting: *Deleted

## 2019-07-26 NOTE — Telephone Encounter (Signed)
Called patient to inform of fu with Dr. Denman George on 09-14-19 - arrival time- 2 pm, lvm for a return call

## 2019-07-27 ENCOUNTER — Telehealth: Payer: Self-pay | Admitting: Interventional Cardiology

## 2019-07-27 NOTE — Telephone Encounter (Signed)
Spoke with daughter and made her aware of recommendations.  Daughter appreciative for call.

## 2019-07-27 NOTE — Telephone Encounter (Addendum)
Spoke with daughter, DPR on file.  She states pt is scheduled to start Cardiac Rehab next week.  Pt recently received a call from PCP and was told she "has cancer in her blood and WBC is low".  Daughter concerned about pt being around others at cardiac rehab with her immune system compromised.  Advised I will send to Dr. Tamala Julian for review and get his thoughts.   Daughter called back to let me know WBC count was down to 2.2 on 07/19/2019.

## 2019-07-27 NOTE — Telephone Encounter (Signed)
Agree. Although rehab will be very helpful, there would be risk associated with mingling given her age and the current Covid 5 pandemic. She should wait.

## 2019-07-27 NOTE — Telephone Encounter (Signed)
New Message    Pts daughter is calling and is wanting to speak with Anderson Malta  She has questions about Starting the rehab    Please call

## 2019-07-28 ENCOUNTER — Telehealth (HOSPITAL_COMMUNITY): Payer: Self-pay

## 2019-07-28 NOTE — Telephone Encounter (Signed)
Successful telephone encounter to Hailey Cobb to confirm Cardiac Rehab Orientation appointment for Tuesday 08/01/2019. Per documentation, Hailey Cobb's comorbidities and low WBC count may hinder her participation at this time. Hailey Cobb confirms she will not be attending Cardiac Rehab Orientation and wishes to cancel all appointments at this time. She will request another referral from Dr. Tamala Julian or her oncologist once her condition improves. Konstantine Gervasi E. Laray Anger, BSN

## 2019-07-28 NOTE — Telephone Encounter (Signed)
Ms. Hailey Cobb prefers not to review medications with me as she believes that they are all updated in the system. Of note, Ms. Hailey Cobb confirms she will not be attending Cardiac Rehab Orientation and wishes to cancel all appointments at this time. She will request another referral from Dr. Tamala Julian or her oncologist once her condition improves.  Sherren Kerns, PharmD PGY1 Acute Care Pharmacy Resident

## 2019-08-01 ENCOUNTER — Telehealth: Payer: Self-pay

## 2019-08-01 ENCOUNTER — Ambulatory Visit (HOSPITAL_COMMUNITY): Payer: Medicare Other

## 2019-08-01 NOTE — Telephone Encounter (Signed)
The pt contacted the TAVR team in regards to having a period of low BP yesterday evening while cooking dinner.  The pt experienced weakness and dizziness and her BP was 96/64, pulse 110. The pt monitors her VS closely and yesterday her readings were 159/67, pulse 86 and 132/71, pulse 97.  Today the pt overall is feeling okay.  She has felt a "little light headed" this morning and her BP is 141/72, pulse 79.  I advised the pt that I will forward this information to her primary cardiologist, Dr Tamala Julian, for review and any additional recommendations.

## 2019-08-01 NOTE — Telephone Encounter (Signed)
Spoke with pt and made her aware of recommendations.  Pt verbalized understanding and was appreciative for call.

## 2019-08-01 NOTE — Telephone Encounter (Signed)
No additional thoughts.  Continue to monitor.  Notify us and not TAVR team if she continues to have spells of low blood pressure.

## 2019-08-02 ENCOUNTER — Telehealth: Payer: Self-pay | Admitting: Hematology

## 2019-08-02 NOTE — Telephone Encounter (Signed)
A new hem appt has been scheduled for Hailey Cobb to see Dr. Irene Limbo on 12.22 at 11am. Pt needed a time for when her husband's caretaker would be with him. She has been made aware to arrive 15 minutes early.

## 2019-08-07 ENCOUNTER — Ambulatory Visit (HOSPITAL_COMMUNITY): Payer: Medicare Other
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
## 2019-08-08 ENCOUNTER — Other Ambulatory Visit: Payer: Self-pay

## 2019-08-08 ENCOUNTER — Inpatient Hospital Stay: Payer: Medicare Other | Attending: Hematology | Admitting: Hematology

## 2019-08-08 ENCOUNTER — Inpatient Hospital Stay: Payer: Medicare Other

## 2019-08-08 VITALS — BP 109/51 | HR 70 | Temp 97.8°F | Resp 18 | Ht 64.0 in | Wt 183.9 lb

## 2019-08-08 DIAGNOSIS — E538 Deficiency of other specified B group vitamins: Secondary | ICD-10-CM

## 2019-08-08 DIAGNOSIS — D61818 Other pancytopenia: Secondary | ICD-10-CM | POA: Diagnosis present

## 2019-08-08 DIAGNOSIS — D696 Thrombocytopenia, unspecified: Secondary | ICD-10-CM | POA: Insufficient documentation

## 2019-08-08 DIAGNOSIS — E039 Hypothyroidism, unspecified: Secondary | ICD-10-CM | POA: Insufficient documentation

## 2019-08-08 DIAGNOSIS — D709 Neutropenia, unspecified: Secondary | ICD-10-CM | POA: Diagnosis not present

## 2019-08-08 DIAGNOSIS — Z8544 Personal history of malignant neoplasm of other female genital organs: Secondary | ICD-10-CM | POA: Diagnosis not present

## 2019-08-08 DIAGNOSIS — D509 Iron deficiency anemia, unspecified: Secondary | ICD-10-CM

## 2019-08-08 DIAGNOSIS — D649 Anemia, unspecified: Secondary | ICD-10-CM | POA: Diagnosis not present

## 2019-08-08 DIAGNOSIS — E119 Type 2 diabetes mellitus without complications: Secondary | ICD-10-CM | POA: Diagnosis not present

## 2019-08-08 LAB — CMP (CANCER CENTER ONLY)
ALT: 16 U/L (ref 0–44)
AST: 19 U/L (ref 15–41)
Albumin: 3.6 g/dL (ref 3.5–5.0)
Alkaline Phosphatase: 88 U/L (ref 38–126)
Anion gap: 9 (ref 5–15)
BUN: 20 mg/dL (ref 8–23)
CO2: 25 mmol/L (ref 22–32)
Calcium: 9.5 mg/dL (ref 8.9–10.3)
Chloride: 101 mmol/L (ref 98–111)
Creatinine: 0.78 mg/dL (ref 0.44–1.00)
GFR, Est AFR Am: >60 mL/min (ref >60)
GFR, Estimated: >60 mL/min (ref >60)
Glucose, Bld: 90 mg/dL (ref 70–99)
Potassium: 4.5 mmol/L (ref 3.5–5.1)
Sodium: 135 mmol/L (ref 135–145)
Total Bilirubin: 0.3 mg/dL (ref 0.3–1.2)
Total Protein: 7 g/dL (ref 6.5–8.1)

## 2019-08-08 LAB — CBC WITH DIFFERENTIAL/PLATELET
Abs Immature Granulocytes: 0.01 10*3/uL (ref 0.00–0.07)
Basophils Absolute: 0 10*3/uL (ref 0.0–0.1)
Basophils Relative: 0 %
Eosinophils Absolute: 0 10*3/uL (ref 0.0–0.5)
Eosinophils Relative: 1 %
HCT: 33 % — ABNORMAL LOW (ref 36.0–46.0)
Hemoglobin: 10.6 g/dL — ABNORMAL LOW (ref 12.0–15.0)
Immature Granulocytes: 0 %
Lymphocytes Relative: 10 %
Lymphs Abs: 0.3 10*3/uL — ABNORMAL LOW (ref 0.7–4.0)
MCH: 29.6 pg (ref 26.0–34.0)
MCHC: 32.1 g/dL (ref 30.0–36.0)
MCV: 92.2 fL (ref 80.0–100.0)
Monocytes Absolute: 0.4 10*3/uL (ref 0.1–1.0)
Monocytes Relative: 14 %
Neutro Abs: 2.3 10*3/uL (ref 1.7–7.7)
Neutrophils Relative %: 75 %
Platelets: 110 10*3/uL — ABNORMAL LOW (ref 150–400)
RBC: 3.58 MIL/uL — ABNORMAL LOW (ref 3.87–5.11)
RDW: 15.2 % (ref 11.5–15.5)
WBC: 3 10*3/uL — ABNORMAL LOW (ref 4.0–10.5)
nRBC: 0 % (ref 0.0–0.2)

## 2019-08-08 LAB — RETICULOCYTES
Immature Retic Fract: 13.3 % (ref 2.3–15.9)
RBC.: 3.62 MIL/uL — ABNORMAL LOW (ref 3.87–5.11)
Retic Count, Absolute: 51 10*3/uL (ref 19.0–186.0)
Retic Ct Pct: 1.4 % (ref 0.4–3.1)

## 2019-08-08 LAB — IRON AND TIBC
Iron: 43 ug/dL (ref 41–142)
Saturation Ratios: 13 % — ABNORMAL LOW (ref 21–57)
TIBC: 331 ug/dL (ref 236–444)
UIBC: 288 ug/dL (ref 120–384)

## 2019-08-08 LAB — IMMATURE PLATELET FRACTION: Immature Platelet Fraction: 3.2 % (ref 1.2–8.6)

## 2019-08-08 LAB — FERRITIN: Ferritin: 13 ng/mL (ref 11–307)

## 2019-08-08 LAB — VITAMIN B12: Vitamin B-12: 138 pg/mL — ABNORMAL LOW (ref 180–914)

## 2019-08-08 LAB — LACTATE DEHYDROGENASE: LDH: 183 U/L (ref 98–192)

## 2019-08-09 ENCOUNTER — Telehealth: Payer: Self-pay | Admitting: Hematology

## 2019-08-09 ENCOUNTER — Ambulatory Visit (HOSPITAL_COMMUNITY): Payer: Medicare Other

## 2019-08-09 LAB — FOLATE RBC
Folate, Hemolysate: 411 ng/mL
Folate, RBC: 1216 ng/mL (ref >498)
Hematocrit: 33.8 % — ABNORMAL LOW (ref 34.0–46.6)

## 2019-08-09 LAB — HAPTOGLOBIN: Haptoglobin: 144 mg/dL (ref 41–333)

## 2019-08-09 LAB — MULTIPLE MYELOMA PANEL, SERUM
Albumin SerPl Elph-Mcnc: 3.5 g/dL (ref 2.9–4.4)
Albumin/Glob SerPl: 1.2 (ref 0.7–1.7)
Alpha 1: 0.2 g/dL (ref 0.0–0.4)
Alpha2 Glob SerPl Elph-Mcnc: 0.7 g/dL (ref 0.4–1.0)
B-Globulin SerPl Elph-Mcnc: 1 g/dL (ref 0.7–1.3)
Gamma Glob SerPl Elph-Mcnc: 1.2 g/dL (ref 0.4–1.8)
Globulin, Total: 3.1 g/dL (ref 2.2–3.9)
IgA: 290 mg/dL (ref 64–422)
IgG (Immunoglobin G), Serum: 1188 mg/dL (ref 586–1602)
IgM (Immunoglobulin M), Srm: 58 mg/dL (ref 26–217)
Total Protein ELP: 6.6 g/dL (ref 6.0–8.5)

## 2019-08-09 LAB — INTRINSIC FACTOR ANTIBODIES: Intrinsic Factor: 1 AU/mL (ref 0.0–1.1)

## 2019-08-09 NOTE — Telephone Encounter (Signed)
Scheduled appt per 12/22 los.  Spoke with pt and they are aware of the appt date and time.

## 2019-08-10 LAB — ANTI-PARIETAL ANTIBODY: Parietal Cell Antibody-IgG: 1.2 Units (ref 0.0–20.0)

## 2019-08-14 ENCOUNTER — Ambulatory Visit (HOSPITAL_COMMUNITY): Payer: Medicare Other

## 2019-08-16 ENCOUNTER — Ambulatory Visit (HOSPITAL_COMMUNITY): Payer: Medicare Other

## 2019-08-21 ENCOUNTER — Ambulatory Visit (HOSPITAL_COMMUNITY): Payer: Medicare Other

## 2019-08-22 ENCOUNTER — Inpatient Hospital Stay: Payer: Medicare Other | Attending: Hematology | Admitting: Hematology

## 2019-08-22 DIAGNOSIS — D509 Iron deficiency anemia, unspecified: Secondary | ICD-10-CM | POA: Diagnosis not present

## 2019-08-22 DIAGNOSIS — Z8544 Personal history of malignant neoplasm of other female genital organs: Secondary | ICD-10-CM | POA: Insufficient documentation

## 2019-08-22 DIAGNOSIS — E538 Deficiency of other specified B group vitamins: Secondary | ICD-10-CM | POA: Diagnosis not present

## 2019-08-22 DIAGNOSIS — D61818 Other pancytopenia: Secondary | ICD-10-CM | POA: Diagnosis not present

## 2019-08-22 MED ORDER — POLYSACCHARIDE IRON COMPLEX 150 MG PO CAPS: 150.0000 mg | ORAL_CAPSULE | Freq: Every day | ORAL | 3 refills | Status: DC

## 2019-08-22 MED ORDER — POLYSACCHARIDE IRON COMPLEX 150 MG PO CAPS: 150.0000 mg | ORAL_CAPSULE | Freq: Every day | ORAL | 0 refills | Status: DC

## 2019-08-22 MED ORDER — B-12 1000 MCG SL SUBL: 2000.0000 ug | SUBLINGUAL_TABLET | Freq: Every day | SUBLINGUAL | 0 refills | Status: DC

## 2019-08-22 MED ORDER — B-12 1000 MCG SL SUBL: 2000.0000 ug | SUBLINGUAL_TABLET | Freq: Every day | SUBLINGUAL | 3 refills | Status: DC
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
## 2019-08-23 ENCOUNTER — Telehealth: Payer: Self-pay | Admitting: Hematology

## 2019-08-23 ENCOUNTER — Ambulatory Visit (HOSPITAL_COMMUNITY): Payer: Medicare Other

## 2019-08-23 NOTE — Telephone Encounter (Signed)
Scheduled appt per 1/5 los, sent a message to HIM pool to get a calendar mailed out.

## 2019-08-25 ENCOUNTER — Ambulatory Visit (HOSPITAL_COMMUNITY): Payer: Medicare Other

## 2019-08-28 ENCOUNTER — Ambulatory Visit (HOSPITAL_COMMUNITY): Payer: Medicare Other

## 2019-08-30 ENCOUNTER — Ambulatory Visit (HOSPITAL_COMMUNITY): Payer: Medicare Other

## 2019-08-30 ENCOUNTER — Telehealth: Payer: Self-pay

## 2019-08-30 NOTE — Telephone Encounter (Signed)
TC to pt in regard to her question about her iron polysaccharides (NIFEREX) 150 MG capsule medication. [facility] [phone]) and spoke with someone and they stated that their pharmacy does not carry that medication that Dr Irene Limbo prescribed so he would need to prescribe her something else or send it to a different pharmacy. Dr Irene Limbo made aware.

## 2019-08-31 ENCOUNTER — Other Ambulatory Visit: Payer: Self-pay | Admitting: Hematology

## 2019-08-31 ENCOUNTER — Other Ambulatory Visit: Payer: Self-pay | Admitting: *Deleted

## 2019-08-31 MED ORDER — POLYSACCHARIDE IRON COMPLEX 150 MG PO CAPS: 150.0000 mg | ORAL_CAPSULE | Freq: Every day | ORAL | 3 refills | Status: AC

## 2019-08-31 NOTE — Telephone Encounter (Signed)
Requested prescription be filled at [facility]. Originally sent to [facility] order pharm. [facility] - confirmed with [facility].

## 2019-09-01 ENCOUNTER — Ambulatory Visit (HOSPITAL_COMMUNITY): Payer: Medicare Other

## 2019-09-04 ENCOUNTER — Ambulatory Visit (HOSPITAL_COMMUNITY): Payer: Medicare Other

## 2019-09-06 ENCOUNTER — Ambulatory Visit (HOSPITAL_COMMUNITY): Payer: Medicare Other

## 2019-09-08 ENCOUNTER — Other Ambulatory Visit: Payer: Self-pay | Admitting: Hematology

## 2019-09-08 ENCOUNTER — Ambulatory Visit (HOSPITAL_COMMUNITY): Payer: Medicare Other

## 2019-09-08 ENCOUNTER — Telehealth: Payer: Self-pay | Admitting: *Deleted

## 2019-09-08 DIAGNOSIS — E538 Deficiency of other specified B group vitamins: Secondary | ICD-10-CM | POA: Insufficient documentation

## 2019-09-08 NOTE — Telephone Encounter (Signed)
Patient called - she has experienced difficulty getting Vit B12 SL 1000 mcg filled at pharmacy (mail order and local). She wants to know if she can receive B12 injection here instead. Dr. Irene Limbo informed and given question. Dr. Irene Limbo response: Can have monthly B12 injections here. Orders placed. Schedule message sent. Contacted patient with information and to inform that she will receive call from scheduling. Patient has appt at [facility] 1/25 with Dr. Denman George, she asks if first appt can be scheduled in conjunction with that appt. Informed her that scheduling will receive that information.

## 2019-09-09 DIAGNOSIS — Z23 Encounter for immunization: Secondary | ICD-10-CM | POA: Diagnosis not present

## 2019-09-11 ENCOUNTER — Ambulatory Visit (HOSPITAL_COMMUNITY): Payer: Medicare Other

## 2019-09-13 ENCOUNTER — Ambulatory Visit (HOSPITAL_COMMUNITY): Payer: Medicare Other

## 2019-09-14 ENCOUNTER — Inpatient Hospital Stay (HOSPITAL_BASED_OUTPATIENT_CLINIC_OR_DEPARTMENT_OTHER): Payer: Medicare Other | Admitting: Gynecologic Oncology

## 2019-09-14 ENCOUNTER — Inpatient Hospital Stay: Payer: Medicare Other

## 2019-09-14 ENCOUNTER — Telehealth: Payer: Self-pay | Admitting: Interventional Cardiology

## 2019-09-14 ENCOUNTER — Other Ambulatory Visit: Payer: Self-pay

## 2019-09-14 ENCOUNTER — Encounter: Payer: Self-pay | Admitting: Gynecologic Oncology

## 2019-09-14 VITALS — BP 160/56 | HR 74 | Temp 97.0°F | Resp 20

## 2019-09-14 DIAGNOSIS — C519 Malignant neoplasm of vulva, unspecified: Secondary | ICD-10-CM

## 2019-09-14 DIAGNOSIS — D61818 Other pancytopenia: Secondary | ICD-10-CM | POA: Diagnosis not present

## 2019-09-14 DIAGNOSIS — E538 Deficiency of other specified B group vitamins: Secondary | ICD-10-CM

## 2019-09-14 DIAGNOSIS — Z8544 Personal history of malignant neoplasm of other female genital organs: Secondary | ICD-10-CM | POA: Diagnosis not present

## 2019-09-14 MED ORDER — CYANOCOBALAMIN 1000 MCG/ML IJ SOLN
1000.0000 ug | INTRAMUSCULAR | Status: DC
  Administered 2019-09-14: 1000 ug via SUBCUTANEOUS

## 2019-09-14 MED ORDER — CYANOCOBALAMIN 1000 MCG/ML IJ SOLN
INTRAMUSCULAR | Status: AC
  Filled 2019-09-14: qty 1

## 2019-09-14 NOTE — Telephone Encounter (Signed)
New Message    Pts daughter is calling and says the pt had an appt today and they recommended her to follow up with her Cardiologist.  She says the pt has not been able to sleep well because she is having problems breathing. The Dr She saw thinks she is having  Afib.  Pts bp  160/56    Please call back
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
## 2019-09-14 NOTE — Patient Instructions (Addendum)
Please notify Dr Denman George at phone number [phone] if you notice vaginal bleeding, new pelvic or abdominal pains, bloating, feeling full easy, or a change in bladder or bowel function.   Please have Dr Clabe Seal office contact Dr Serita Grit office (at [phone]) in April to request an appointment with her for July, 2021.  Please contact Dr Rex Kras to schedule follow-up for your heart symptoms as you appear to be in atrial fibrillation. If you cannot see him within a week, call the cardiologist to see them instead.

## 2019-09-14 NOTE — Telephone Encounter (Signed)
I spoke to the patient's daughter [name]) who called because the patient has been experiencing swelling and SOB.  It is hard for her to breath when laying down.    She was only taking 20 mg of Lasix, but is prescribed 40 mg Daily.  She will resume that.  She is to see Dr Tamala Julian on 2/23, but wants to be seen sooner.  She thinks that she may be having A Fib again, also.

## 2019-09-14 NOTE — Patient Instructions (Signed)
Cyanocobalamin, Pyridoxine, and Folate What is this medicine? A multivitamin containing folic acid, vitamin B6, and vitamin B12. This medicine may be used for other purposes; ask your health care provider or pharmacist if you have questions. COMMON BRAND NAME(S): AllanFol RX, AllanTex, Av-Vite FB, B Complex with Folic Acid, ComBgen, FaBB, Folamin, Folastin, Folbalin, Folbee, Folbic, Folcaps, Folgard, Folgard RX, Folgard RX 2.2, Folplex, Folplex 2.2, Foltabs 800, Foltx, Homocysteine Formula, Niva-Fol, NuFol, TL Gard RX, Virt-Gard, Virt-Vite, Virt-Vite Forte, Vita-Respa What should I tell my health care provider before I take this medicine? They need to know if you have any of these conditions:  bleeding or clotting disorder  history of anemia of any type  other chronic health condition  an unusual or allergic reaction to vitamins, other medicines, foods, dyes, or preservatives  pregnant or trying to get pregnant  breast-feeding How should I use this medicine? Take by mouth with a glass of water. May take with food. Follow the directions on the prescription label. It is usually given once a day. Do not take your medicine more often than directed. Contact your pediatrician regarding the use of this medicine in children. Special care may be needed. Overdosage: If you think you have taken too much of this medicine contact a poison control center or emergency room at once. NOTE: This medicine is only for you. Do not share this medicine with others. What if I miss a dose? If you miss a dose, take it as soon as you can. If it is almost time for your next dose, take only that dose. Do not take double or extra doses. What may interact with this medicine?  levodopa This list may not describe all possible interactions. Give your health care provider a list of all the medicines, herbs, non-prescription drugs, or dietary supplements you use. Also tell them if you smoke, drink alcohol, or use illegal  drugs. Some items may interact with your medicine. What should I watch for while using this medicine? See your health care professional for regular checks on your progress. Remember that vitamin supplements do not replace the need for good nutrition from a balanced diet. What side effects may I notice from receiving this medicine? Side effects that you should report to your doctor or health care professional as soon as possible:  allergic reaction such as skin rash or difficulty breathing  vomiting Side effects that usually do not require medical attention (report to your doctor or health care professional if they continue or are bothersome):  nausea  stomach upset This list may not describe all possible side effects. Call your doctor for medical advice about side effects. You may report side effects to FDA at 1-800-FDA-1088. Where should I keep my medicine? Keep out of the reach of children. Most vitamins should be stored at controlled room temperature. Check your specific product directions. Protect from heat and moisture. Throw away any unused medicine after the expiration date. NOTE: This sheet is a summary. It may not cover all possible information. If you have questions about this medicine, talk to your doctor, pharmacist, or health care provider.  2020 Elsevier/Gold Standard (2007-09-24 00:59:55)  

## 2019-09-15 ENCOUNTER — Ambulatory Visit (HOSPITAL_COMMUNITY): Payer: Medicare Other

## 2019-09-15 DIAGNOSIS — R002 Palpitations: Secondary | ICD-10-CM | POA: Diagnosis not present

## 2019-09-15 DIAGNOSIS — E538 Deficiency of other specified B group vitamins: Secondary | ICD-10-CM | POA: Diagnosis not present
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
## 2019-09-15 NOTE — Telephone Encounter (Signed)
Follow Up  Patient is calling in to follow up about afib symptoms. Please give patient a call back to discuss.

## 2019-09-15 NOTE — Telephone Encounter (Signed)
Spoke with PCP office earlier and they said pt was coming in to see them at 66 but they were trying to see if she needed to just come to our office instead.  No available appts in our office today. Spoke with Dr. Tamala Julian and he said to have pt take Furosemide 80mg  BID today and keep appt with PCP to check rhythm and start anticoags if needed and have pt come into our office early next week.  Spoke with pt and reviewed recommendations.  Advised when appropriate to go to ER.  Scheduled pt to see Truitt Merle, NP on Monday.  Pt verbalized understanding and was in agreement with this plan.

## 2019-09-18 ENCOUNTER — Ambulatory Visit (INDEPENDENT_AMBULATORY_CARE_PROVIDER_SITE_OTHER): Payer: Medicare Other | Admitting: Nurse Practitioner

## 2019-09-18 ENCOUNTER — Ambulatory Visit (HOSPITAL_COMMUNITY): Payer: Medicare Other

## 2019-09-18 ENCOUNTER — Other Ambulatory Visit: Payer: Self-pay

## 2019-09-18 ENCOUNTER — Encounter: Payer: Self-pay | Admitting: Nurse Practitioner

## 2019-09-18 VITALS — BP 140/68 | HR 74 | Ht 64.0 in | Wt 182.4 lb

## 2019-09-18 DIAGNOSIS — I10 Essential (primary) hypertension: Secondary | ICD-10-CM | POA: Diagnosis not present

## 2019-09-18 DIAGNOSIS — Z952 Presence of prosthetic heart valve: Secondary | ICD-10-CM | POA: Diagnosis not present

## 2019-09-18 DIAGNOSIS — I459 Conduction disorder, unspecified: Secondary | ICD-10-CM | POA: Diagnosis not present

## 2019-09-18 DIAGNOSIS — I5032 Chronic diastolic (congestive) heart failure: Secondary | ICD-10-CM | POA: Diagnosis not present

## 2019-09-18 DIAGNOSIS — Z8719 Personal history of other diseases of the digestive system: Secondary | ICD-10-CM | POA: Diagnosis not present

## 2019-09-18 MED ORDER — AMLODIPINE BESYLATE 5 MG PO TABS: 5.0000 mg | ORAL_TABLET | Freq: Every day | ORAL | 3 refills | Status: DC

## 2019-09-18 NOTE — Patient Instructions (Signed)
After Visit Summary:  We will be checking the following labs today - BMET, CBC, TSH   Medication Instructions:    Continue with your current medicines. BUT  I am going to cut the Norvasc in half - cut your 10 mg in half - I have sent the RX for the 5 mg to your mail order   If you need a refill on your cardiac medications before your next appointment, please call your pharmacy.     Testing/Procedures To Be Arranged:  N/A  Follow-Up:   See [place] in about 4 weeks    At [facility], you and your health needs are our priority.  As part of our continuing mission to provide you with exceptional heart care, we have created designated Provider Care Teams.  These Care Teams include your primary Cardiologist (physician) and Advanced Practice Providers (APPs -  Physician Assistants and Nurse Practitioners) who all work together to provide you with the care you need, when you need it.  Special Instructions:  . Stay safe, stay home, wash your hands for at least 20 seconds and wear a mask when out in public.  . It was good to talk with you today.  . You are not in atrial fibrillation today - this is good - you are having "extra beats" instead - probably from not sleeping and maybe your thyroid level - we are going to check your lab today, cut your Norvasc back (to help the swelling go away) and you are going to talk with Dr. Rex Kras about possible antidepressant medicine that can help you get back to sleeping better.     Call the [place] office at [phone] if you have any questions, problems or concerns.

## 2019-09-19 LAB — CBC
Hematocrit: 34.7 % (ref 34.0–46.6)
Hemoglobin: 11.3 g/dL (ref 11.1–15.9)
MCH: 29.6 pg (ref 26.6–33.0)
MCHC: 32.6 g/dL (ref 31.5–35.7)
MCV: 91 fL (ref 79–97)
Platelets: 121 10*3/uL — ABNORMAL LOW (ref 150–450)
RBC: 3.82 x10E6/uL (ref 3.77–5.28)
RDW: 14.8 % (ref 11.7–15.4)
WBC: 3.2 10*3/uL — ABNORMAL LOW (ref 3.4–10.8)

## 2019-09-19 LAB — TSH: TSH: 0.587 u[IU]/mL (ref 0.450–4.500)

## 2019-09-19 LAB — BASIC METABOLIC PANEL
BUN/Creatinine Ratio: 24 (ref 12–28)
BUN: 17 mg/dL (ref 8–27)
CO2: 25 mmol/L (ref 20–29)
Calcium: 10 mg/dL (ref 8.7–10.3)
Chloride: 95 mmol/L — ABNORMAL LOW (ref 96–106)
Creatinine, Ser: 0.72 mg/dL (ref 0.57–1.00)
GFR calc Af Amer: 88 mL/min/{1.73_m2} (ref >59)
GFR calc non Af Amer: 77 mL/min/{1.73_m2} (ref >59)
Glucose: 115 mg/dL — ABNORMAL HIGH (ref 65–99)
Potassium: 4.6 mmol/L (ref 3.5–5.2)
Sodium: 135 mmol/L (ref 134–144)

## 2019-09-20 ENCOUNTER — Ambulatory Visit (HOSPITAL_COMMUNITY): Payer: Medicare Other

## 2019-09-22 ENCOUNTER — Ambulatory Visit (HOSPITAL_COMMUNITY): Payer: Medicare Other

## 2019-09-25 ENCOUNTER — Ambulatory Visit (HOSPITAL_COMMUNITY): Payer: Medicare Other

## 2019-09-25 ENCOUNTER — Telehealth: Payer: Self-pay | Admitting: Hematology

## 2019-09-25 NOTE — Telephone Encounter (Signed)
Cancelled 2/26, 3/26, and 4/26 appts per 2/5 sch msg, pt req. Confirmed with pt the cancelled appts, pt is going to PCP to get injections

## 2019-09-27 ENCOUNTER — Ambulatory Visit (HOSPITAL_COMMUNITY): Payer: Medicare Other

## 2019-09-28 ENCOUNTER — Other Ambulatory Visit: Payer: Self-pay | Admitting: Hematology

## 2019-09-29 ENCOUNTER — Ambulatory Visit (HOSPITAL_COMMUNITY): Payer: Medicare Other

## 2019-10-07 DIAGNOSIS — Z23 Encounter for immunization: Secondary | ICD-10-CM | POA: Diagnosis not present
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
## 2019-10-10 ENCOUNTER — Encounter: Payer: Self-pay | Admitting: Interventional Cardiology

## 2019-10-10 ENCOUNTER — Other Ambulatory Visit: Payer: Self-pay

## 2019-10-10 ENCOUNTER — Ambulatory Visit (INDEPENDENT_AMBULATORY_CARE_PROVIDER_SITE_OTHER): Payer: Medicare Other | Admitting: Interventional Cardiology

## 2019-10-10 VITALS — BP 104/48 | HR 90 | Ht 64.0 in | Wt 183.8 lb

## 2019-10-10 DIAGNOSIS — I495 Sick sinus syndrome: Secondary | ICD-10-CM | POA: Diagnosis not present

## 2019-10-10 DIAGNOSIS — Z952 Presence of prosthetic heart valve: Secondary | ICD-10-CM

## 2019-10-10 DIAGNOSIS — I6320 Cerebral infarction due to unspecified occlusion or stenosis of unspecified precerebral arteries: Secondary | ICD-10-CM | POA: Diagnosis not present

## 2019-10-10 DIAGNOSIS — I4510 Unspecified right bundle-branch block: Secondary | ICD-10-CM | POA: Diagnosis not present

## 2019-10-10 DIAGNOSIS — Z8719 Personal history of other diseases of the digestive system: Secondary | ICD-10-CM

## 2019-10-10 DIAGNOSIS — Z7189 Other specified counseling: Secondary | ICD-10-CM | POA: Diagnosis not present

## 2019-10-10 DIAGNOSIS — I10 Essential (primary) hypertension: Secondary | ICD-10-CM | POA: Diagnosis not present

## 2019-10-10 DIAGNOSIS — I5033 Acute on chronic diastolic (congestive) heart failure: Secondary | ICD-10-CM | POA: Diagnosis not present

## 2019-10-10 DIAGNOSIS — E538 Deficiency of other specified B group vitamins: Secondary | ICD-10-CM | POA: Diagnosis not present

## 2019-10-10 MED ORDER — FUROSEMIDE 40 MG PO TABS: ORAL_TABLET | ORAL | 3 refills | Status: DC

## 2019-10-10 NOTE — Patient Instructions (Signed)
Medication Instructions:  1) Take Furosemide 40mg  one day, then 20mg  (half tablet) the next day and continue alternating.  Make sure you are weighing daily under the same circumstances (same time of day, same amount of clothing).  Contact the office if you gain 3 pounds overnight or 5 pounds in a week.   *If you need a refill on your cardiac medications before your next appointment, please call your pharmacy*  Lab Work: BMET today  If you have labs (blood work) drawn today and your tests are completely normal, you will receive your results only by: Marland Kitchen MyChart Message (if you have MyChart) OR . A paper copy in the mail If you have any lab test that is abnormal or we need to change your treatment, we will call you to review the results.  Testing/Procedures: None  Follow-Up: At [facility], you and your health needs are our priority.  As part of our continuing mission to provide you with exceptional heart care, we have created designated Provider Care Teams.  These Care Teams include your primary Cardiologist (physician) and Advanced Practice Providers (APPs -  Physician Assistants and Nurse Practitioners) who all work together to provide you with the care you need, when you need it.  Your next appointment:   6 month(s)  The format for your next appointment:   In Person  Provider:   You may see Sinclair Grooms, MD or one of the following Advanced Practice Providers on your designated Care Team:    Truitt Merle, NP  Cecilie Kicks, NP  Kathyrn Drown, NP   Other Instructions

## 2019-10-11 LAB — BASIC METABOLIC PANEL
BUN/Creatinine Ratio: 22 (ref 12–28)
BUN: 15 mg/dL (ref 8–27)
CO2: 24 mmol/L (ref 20–29)
Calcium: 10.2 mg/dL (ref 8.7–10.3)
Chloride: 97 mmol/L (ref 96–106)
Creatinine, Ser: 0.67 mg/dL (ref 0.57–1.00)
GFR calc Af Amer: 93 mL/min/{1.73_m2} (ref >59)
GFR calc non Af Amer: 80 mL/min/{1.73_m2} (ref >59)
Glucose: 158 mg/dL — ABNORMAL HIGH (ref 65–99)
Potassium: 4.4 mmol/L (ref 3.5–5.2)
Sodium: 134 mmol/L (ref 134–144)

## 2019-10-13 ENCOUNTER — Ambulatory Visit: Payer: Medicare Other

## 2019-10-18 DIAGNOSIS — E113293 Type 2 diabetes mellitus with mild nonproliferative diabetic retinopathy without macular edema, bilateral: Secondary | ICD-10-CM | POA: Diagnosis not present

## 2019-10-18 DIAGNOSIS — H15833 Staphyloma posticum, bilateral: Secondary | ICD-10-CM | POA: Diagnosis not present

## 2019-10-18 DIAGNOSIS — H43813 Vitreous degeneration, bilateral: Secondary | ICD-10-CM | POA: Diagnosis not present

## 2019-10-20 ENCOUNTER — Other Ambulatory Visit: Payer: Self-pay

## 2019-10-25 DIAGNOSIS — E039 Hypothyroidism, unspecified: Secondary | ICD-10-CM | POA: Diagnosis not present

## 2019-10-25 DIAGNOSIS — L931 Subacute cutaneous lupus erythematosus: Secondary | ICD-10-CM | POA: Diagnosis not present

## 2019-10-25 DIAGNOSIS — Z952 Presence of prosthetic heart valve: Secondary | ICD-10-CM | POA: Diagnosis not present

## 2019-10-25 DIAGNOSIS — I10 Essential (primary) hypertension: Secondary | ICD-10-CM | POA: Diagnosis not present

## 2019-10-25 DIAGNOSIS — E782 Mixed hyperlipidemia: Secondary | ICD-10-CM | POA: Diagnosis not present

## 2019-10-25 DIAGNOSIS — C519 Malignant neoplasm of vulva, unspecified: Secondary | ICD-10-CM | POA: Diagnosis not present

## 2019-10-25 DIAGNOSIS — M47816 Spondylosis without myelopathy or radiculopathy, lumbar region: Secondary | ICD-10-CM | POA: Diagnosis not present

## 2019-10-25 DIAGNOSIS — I5030 Unspecified diastolic (congestive) heart failure: Secondary | ICD-10-CM | POA: Diagnosis not present

## 2019-10-25 DIAGNOSIS — F419 Anxiety disorder, unspecified: Secondary | ICD-10-CM | POA: Diagnosis not present

## 2019-10-25 DIAGNOSIS — E11319 Type 2 diabetes mellitus with unspecified diabetic retinopathy without macular edema: Secondary | ICD-10-CM | POA: Diagnosis not present

## 2019-10-25 DIAGNOSIS — K754 Autoimmune hepatitis: Secondary | ICD-10-CM | POA: Diagnosis not present

## 2019-10-25 DIAGNOSIS — E538 Deficiency of other specified B group vitamins: Secondary | ICD-10-CM | POA: Diagnosis not present

## 2019-10-26 ENCOUNTER — Other Ambulatory Visit: Payer: Self-pay

## 2019-10-26 MED ORDER — FUROSEMIDE 40 MG PO TABS: 20.0000 mg | ORAL_TABLET | Freq: Every day | ORAL | 3 refills | Status: DC

## 2019-11-10 ENCOUNTER — Ambulatory Visit: Payer: Medicare Other

## 2019-11-14 DIAGNOSIS — E538 Deficiency of other specified B group vitamins: Secondary | ICD-10-CM | POA: Diagnosis not present

## 2019-11-20 ENCOUNTER — Inpatient Hospital Stay: Payer: Medicare Other | Attending: Hematology | Admitting: Hematology

## 2019-11-20 ENCOUNTER — Other Ambulatory Visit: Payer: Self-pay

## 2019-11-20 ENCOUNTER — Inpatient Hospital Stay: Payer: Medicare Other

## 2019-11-20 VITALS — BP 138/56 | HR 70 | Temp 98.5°F | Resp 18 | Ht 64.0 in | Wt 182.5 lb

## 2019-11-20 DIAGNOSIS — Z8544 Personal history of malignant neoplasm of other female genital organs: Secondary | ICD-10-CM | POA: Diagnosis not present

## 2019-11-20 DIAGNOSIS — E119 Type 2 diabetes mellitus without complications: Secondary | ICD-10-CM | POA: Insufficient documentation

## 2019-11-20 DIAGNOSIS — E538 Deficiency of other specified B group vitamins: Secondary | ICD-10-CM | POA: Diagnosis not present

## 2019-11-20 DIAGNOSIS — I6320 Cerebral infarction due to unspecified occlusion or stenosis of unspecified precerebral arteries: Secondary | ICD-10-CM | POA: Diagnosis not present

## 2019-11-20 DIAGNOSIS — D509 Iron deficiency anemia, unspecified: Secondary | ICD-10-CM

## 2019-11-20 DIAGNOSIS — E611 Iron deficiency: Secondary | ICD-10-CM | POA: Diagnosis not present

## 2019-11-20 DIAGNOSIS — E039 Hypothyroidism, unspecified: Secondary | ICD-10-CM | POA: Diagnosis not present

## 2019-11-20 DIAGNOSIS — D61818 Other pancytopenia: Secondary | ICD-10-CM | POA: Diagnosis not present

## 2019-11-20 LAB — CMP (CANCER CENTER ONLY)
ALT: 14 U/L (ref 0–44)
AST: 19 U/L (ref 15–41)
Albumin: 3.6 g/dL (ref 3.5–5.0)
Alkaline Phosphatase: 91 U/L (ref 38–126)
Anion gap: 8 (ref 5–15)
BUN: 18 mg/dL (ref 8–23)
CO2: 26 mmol/L (ref 22–32)
Calcium: 10.1 mg/dL (ref 8.9–10.3)
Chloride: 100 mmol/L (ref 98–111)
Creatinine: 0.8 mg/dL (ref 0.44–1.00)
GFR, Est AFR Am: >60 mL/min (ref >60)
GFR, Estimated: >60 mL/min (ref >60)
Glucose, Bld: 104 mg/dL — ABNORMAL HIGH (ref 70–99)
Potassium: 4.4 mmol/L (ref 3.5–5.1)
Sodium: 134 mmol/L — ABNORMAL LOW (ref 135–145)
Total Bilirubin: 0.3 mg/dL (ref 0.3–1.2)
Total Protein: 7.5 g/dL (ref 6.5–8.1)

## 2019-11-20 LAB — CBC WITH DIFFERENTIAL/PLATELET
Abs Immature Granulocytes: 0.01 10*3/uL (ref 0.00–0.07)
Basophils Absolute: 0 10*3/uL (ref 0.0–0.1)
Basophils Relative: 1 %
Eosinophils Absolute: 0.1 10*3/uL (ref 0.0–0.5)
Eosinophils Relative: 2 %
HCT: 34.2 % — ABNORMAL LOW (ref 36.0–46.0)
Hemoglobin: 11 g/dL — ABNORMAL LOW (ref 12.0–15.0)
Immature Granulocytes: 0 %
Lymphocytes Relative: 8 %
Lymphs Abs: 0.3 10*3/uL — ABNORMAL LOW (ref 0.7–4.0)
MCH: 29.4 pg (ref 26.0–34.0)
MCHC: 32.2 g/dL (ref 30.0–36.0)
MCV: 91.4 fL (ref 80.0–100.0)
Monocytes Absolute: 0.4 10*3/uL (ref 0.1–1.0)
Monocytes Relative: 10 %
Neutro Abs: 3.3 10*3/uL (ref 1.7–7.7)
Neutrophils Relative %: 79 %
Platelets: 126 10*3/uL — ABNORMAL LOW (ref 150–400)
RBC: 3.74 MIL/uL — ABNORMAL LOW (ref 3.87–5.11)
RDW: 15.5 % (ref 11.5–15.5)
WBC: 4.1 10*3/uL (ref 4.0–10.5)
nRBC: 0 % (ref 0.0–0.2)

## 2019-11-20 LAB — IRON AND TIBC
Iron: 47 ug/dL (ref 41–142)
Saturation Ratios: 17 % — ABNORMAL LOW (ref 21–57)
TIBC: 285 ug/dL (ref 236–444)
UIBC: 238 ug/dL (ref 120–384)

## 2019-11-20 LAB — FERRITIN: Ferritin: 28 ng/mL (ref 11–307)

## 2019-11-20 LAB — VITAMIN B12: Vitamin B-12: 354 pg/mL (ref 180–914)
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
## 2019-11-24 ENCOUNTER — Telehealth: Payer: Self-pay | Admitting: *Deleted

## 2019-11-24 NOTE — Telephone Encounter (Signed)
Patient called - requested most recent lab test results faxed to Dr. Hulan Fess     Faxed to: [phone] as requested. Fax confirmation received

## 2019-11-30 ENCOUNTER — Telehealth: Payer: Self-pay | Admitting: Hematology

## 2019-11-30 DIAGNOSIS — R05 Cough: Secondary | ICD-10-CM | POA: Diagnosis not present

## 2019-11-30 NOTE — Telephone Encounter (Signed)
Scheduled per 04/05 los, patient has been called and notified.

## 2019-12-06 DIAGNOSIS — R05 Cough: Secondary | ICD-10-CM | POA: Diagnosis not present

## 2019-12-06 DIAGNOSIS — E039 Hypothyroidism, unspecified: Secondary | ICD-10-CM | POA: Diagnosis not present

## 2019-12-06 DIAGNOSIS — E538 Deficiency of other specified B group vitamins: Secondary | ICD-10-CM | POA: Diagnosis not present

## 2019-12-06 DIAGNOSIS — J309 Allergic rhinitis, unspecified: Secondary | ICD-10-CM | POA: Diagnosis not present

## 2019-12-11 ENCOUNTER — Ambulatory Visit: Payer: Medicare Other | Admitting: Radiation Oncology

## 2019-12-11 ENCOUNTER — Ambulatory Visit: Payer: Medicare Other

## 2019-12-13 NOTE — Progress Notes (Signed)
Ms. Rudge presents today for follow up after completing radiation to the vulva on 06/01/2019  Pain: no pain pertaining to treatment area Fatigue: moderate  N/V/D: C/O nausea and diarrhea  Bladder: no pain or discomfort with urination Vaginal/Rectal bleeding: none present Skin (yeast infection improved?) improved   Completed azithromycin 2 weeks ago  Blood pressure (!) 150/70, pulse (!) 52, temperature 98.3 F (36.8 C), resp. rate 18, height 5\' 4"  (1.626 m), weight 183 lb 3.2 oz (83.1 kg), SpO2 98 %.   Last Weight  Most recent update: 12/18/2019  3:37 PM   Weight  83.1 kg (183 lb 3.2 oz)

## 2019-12-18 ENCOUNTER — Ambulatory Visit
Admission: RE | Admit: 2019-12-18 | Discharge: 2019-12-18 | Disposition: A | Discharge status: Home / Self Care | Payer: Medicare Other | Source: Ambulatory Visit | Attending: Radiation Oncology | Admitting: Radiation Oncology

## 2019-12-18 ENCOUNTER — Encounter: Payer: Self-pay | Admitting: Radiation Oncology

## 2019-12-18 ENCOUNTER — Other Ambulatory Visit: Payer: Self-pay

## 2019-12-18 VITALS — BP 150/70 | HR 52 | Temp 98.3°F | Resp 18 | Ht 64.0 in | Wt 183.2 lb

## 2019-12-18 DIAGNOSIS — Z8544 Personal history of malignant neoplasm of other female genital organs: Secondary | ICD-10-CM | POA: Diagnosis not present

## 2019-12-18 DIAGNOSIS — R197 Diarrhea, unspecified: Secondary | ICD-10-CM | POA: Diagnosis not present

## 2019-12-18 DIAGNOSIS — Z08 Encounter for follow-up examination after completed treatment for malignant neoplasm: Secondary | ICD-10-CM | POA: Diagnosis not present

## 2019-12-18 DIAGNOSIS — Z7982 Long term (current) use of aspirin: Secondary | ICD-10-CM | POA: Insufficient documentation

## 2019-12-18 DIAGNOSIS — Z79899 Other long term (current) drug therapy: Secondary | ICD-10-CM | POA: Insufficient documentation

## 2019-12-18 DIAGNOSIS — Z8589 Personal history of malignant neoplasm of other organs and systems: Secondary | ICD-10-CM | POA: Diagnosis not present

## 2019-12-18 DIAGNOSIS — Z7984 Long term (current) use of oral hypoglycemic drugs: Secondary | ICD-10-CM | POA: Insufficient documentation

## 2019-12-18 DIAGNOSIS — D649 Anemia, unspecified: Secondary | ICD-10-CM | POA: Insufficient documentation

## 2019-12-18 DIAGNOSIS — C519 Malignant neoplasm of vulva, unspecified: Secondary | ICD-10-CM

## 2019-12-18 NOTE — Progress Notes (Signed)
Radiation Oncology         ([phone]) [phone] ________________________________  Name: Hailey Cobb MRN: [id number]  Date: 12/18/2019  DOB: Apr 08, 1932  Follow-Up Visit Note  CC: Hulan Fess, MD  Hulan Fess, MD    ICD-10-CM   1. 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  Radiation Treatment Dates: 04/05/2019 through 06/01/2019 Site Technique Total Dose (Gy) Dose per Fx (Gy) Completed Fx Beam Energies  Pelvis: Pelvis IMRT 50.4/50.4 1.8 28/28 10X  Pelvis: Pelvis_Bst IMRT 14/14 2 7/7 10X    Narrative:  The patient returns today for routine follow-up. The patient followed-up with Dr. Denman George on 09/14/2019, during which time she was noted to have a complete clinical response.  The patient was last seen by Dr. Irene Limbo on 11/20/2019. At that time, her anemia had improved. Her labs were to be checked during routine follow-ups.  On review of systems, she reports occasional episodes of diarrhea which she is unable to control resulting in leakage. She denies abdominal cramping or pain.  She denies any blood in her stools vaginal bleeding or hematuria.    ALLERGIES:  is allergic to metoprolol; clonidine derivatives; glimepiride; and invokana [canagliflozin].  Meds: Current Outpatient Medications  Medication Sig Dispense Refill  . ALPRAZolam (XANAX) 0.5 MG tablet Take 0.5 mg by mouth See admin instructions. Take 0.5 mg by mouth daily at bedtime and may also take an additional 0.5 mg during the day as needed for anxiety    . amoxicillin (AMOXIL) 500 MG tablet Take 4 capsules (2,000mg ) one hour prior to all dental visits. 8 tablet 3  . aspirin 81 MG chewable tablet Chew 81 mg by mouth daily.    . Azelastine HCl (ASTEPRO) 0.15 % SOLN Place 2 sprays into both nostrils at bedtime.     . Cyanocobalamin (B-12 COMPLIANCE INJECTION IJ) Inject as directed every 30 (thirty)  days.    Marland Kitchen doxazosin (CARDURA) 2 MG tablet Take 4-8 mg by mouth See admin instructions. Take 4 mg by mouth in the morning and 8 mg in the evening    . furosemide (LASIX) 40 MG tablet Take 0.5-1 tablets (20-40 mg total) by mouth daily. 70 tablet 3  . HYDROcodone-acetaminophen (NORCO/VICODIN) 5-325 MG tablet Take 1-2 tablets by mouth every 4 (four) hours as needed for severe pain. Do not take and drive 30 tablet 0  . irbesartan (AVAPRO) 150 MG tablet Take 150 mg by mouth 2 (two) times daily.    . metFORMIN (GLUCOPHAGE) 1000 MG tablet Take 1,000 mg by mouth 2 (two) times daily with a meal.    . OVER THE COUNTER MEDICATION Take 3 tablets by mouth daily. Thymic factor vitamins    . polyvinyl alcohol (LIQUIFILM TEARS) 1.4 % ophthalmic solution Place 1 drop into both eyes every 6 (six) hours as needed for dry eyes.    . rosuvastatin (CRESTOR) 20 MG tablet Take 20 mg by mouth See admin instructions. Take 20 mg by mouth at bedtime only on Mon/Wed/Fri nights    . sitaGLIPtin (JANUVIA) 100 MG tablet Take 100 mg by mouth daily.     Marland Kitchen amLODipine (NORVASC) 5 MG tablet Take 1 tablet (5 mg total) by mouth daily. 90 tablet 3  . clopidogrel (PLAVIX) 75 MG tablet Take 1 tablet (75 mg total) by mouth daily. (Patient not taking: Reported on 11/20/2019) 90 tablet 1  . iron polysaccharides (  NIFEREX) 150 MG capsule Take 1 capsule (150 mg total) by mouth daily. (Patient not taking: Reported on 12/18/2019) 90 capsule 3  . levothyroxine (SYNTHROID) 137 MCG tablet Take 1 tablet (137 mcg total) by mouth daily before breakfast. 30 tablet 2  . pantoprazole (PROTONIX) 40 MG tablet Take 1 tablet (40 mg total) by mouth daily. (Patient not taking: Reported on 12/18/2019) 90 tablet 3   No current facility-administered medications for this encounter.    Physical Findings: The patient is in no acute distress. Patient is alert and oriented.  height is 5\' 4"  (1.626 m) and weight is 183 lb 3.2 oz (83.1 kg). Her temperature is 98.3 F (36.8  C). Her blood pressure is 150/70 (abnormal) and her pulse is 52 (abnormal). Her respiration is 18 and oxygen saturation is 98%.   Lungs are clear to auscultation bilaterally. Heart has regular rate and rhythm.  No A. fib noted today. no palpable cervical, supraclavicular, or axillary adenopathy. Abdomen soft, non-tender, normal bowel sounds.  The inguinal areas are free of adenopathy.  Pelvic exam no residual tumor is palpated or seen in the vulvar area.  Mild radiation changes.  some edema of vulva  related to her radiation therapy.  On speculum exam no mucosal lesions noted in the vaginal vault.  The cervix and uterus are surgically absent.  Rectal exam deferred at the patient's request.  No palpable pelvic masses   Lab Findings: Lab Results  Component Value Date   WBC 4.1 11/20/2019   HGB 11.0 (L) 11/20/2019   HCT 34.2 (L) 11/20/2019   MCV 91.4 11/20/2019   PLT 126 (L) 11/20/2019    Radiographic Findings: No results found.  Impression: Clinical stage IB squamous carcinoma  Clinically stable with no evidence of recurrence.  Plan: Follow-up with Dr. Denman George in 3 months and with radiation oncology in 6 months.  Discussed possible physical therapy evaluation for fecal incontinence if this continues to be an issue.  ____________________________________   Blair Promise, PhD, MD  This document serves as a record of services personally performed by Gery Pray, MD. It was created on his behalf by Clerance Lav, a trained medical scribe. The creation of this record is based on the scribe's personal observations and the provider's statements to them. This document has been checked and approved by the attending provider.

## 2019-12-27 DIAGNOSIS — J189 Pneumonia, unspecified organism: Secondary | ICD-10-CM | POA: Diagnosis not present

## 2019-12-28 ENCOUNTER — Other Ambulatory Visit: Payer: Self-pay | Admitting: Family Medicine

## 2019-12-28 DIAGNOSIS — J189 Pneumonia, unspecified organism: Secondary | ICD-10-CM

## 2019-12-29 ENCOUNTER — Other Ambulatory Visit: Payer: Medicare Other

## 2019-12-29 ENCOUNTER — Ambulatory Visit
Admission: RE | Admit: 2019-12-29 | Discharge: 2019-12-29 | Disposition: A | Discharge status: Home / Self Care | Payer: Medicare Other | Source: Ambulatory Visit | Attending: Family Medicine | Admitting: Family Medicine

## 2019-12-29 ENCOUNTER — Encounter: Payer: Self-pay | Admitting: Gynecologic Oncology

## 2019-12-29 DIAGNOSIS — J181 Lobar pneumonia, unspecified organism: Secondary | ICD-10-CM | POA: Diagnosis not present

## 2019-12-29 DIAGNOSIS — J42 Unspecified chronic bronchitis: Secondary | ICD-10-CM | POA: Diagnosis not present

## 2019-12-29 DIAGNOSIS — J189 Pneumonia, unspecified organism: Secondary | ICD-10-CM

## 2019-12-29 MED ORDER — IOPAMIDOL (ISOVUE-300) INJECTION 61%
75.0000 mL | Freq: Once | INTRAVENOUS | Status: AC | PRN
  Administered 2019-12-29: 75 mL via INTRAVENOUS

## 2020-01-01 ENCOUNTER — Telehealth: Payer: Self-pay

## 2020-01-01 NOTE — Telephone Encounter (Signed)
Spoke with Ms Fruchter and told her that Dr. Hulan Fess ordered the CT scan and she needs to call his office to discuss the results and he needs to order and further scans for further work up per Joylene John, NP. Pt verbalized understanding.

## 2020-01-01 NOTE — Telephone Encounter (Signed)
Spoke with Pamala Hurry. She is aware that Dr. Shearon Stalls will see the patient. She has been scheduled for 130pm on 5/19. Pamala Hurry will relay the information to the patient.   Nothing further needed at time of call.

## 2020-01-01 NOTE — Telephone Encounter (Signed)
Yes that is fine. Ok to schedule on 5/19.

## 2020-01-01 NOTE — Telephone Encounter (Signed)
Left detailed message for Hailey Cobb that I would send a message to one of our doctors to see if they would be able to see her.   Dr. Shearon Stalls, would you ok with seeing this patient on 5/19 as a consult? I checked both Dr. Lamonte Sakai and Dr. Valeta Harms schedules and they did not have any openings. Thanks!

## 2020-01-02 ENCOUNTER — Other Ambulatory Visit: Payer: Medicare Other

## 2020-01-03 ENCOUNTER — Encounter: Payer: Self-pay | Admitting: Internal Medicine

## 2020-01-03 ENCOUNTER — Other Ambulatory Visit: Payer: Self-pay

## 2020-01-03 ENCOUNTER — Ambulatory Visit (INDEPENDENT_AMBULATORY_CARE_PROVIDER_SITE_OTHER): Payer: Medicare Other | Admitting: Internal Medicine

## 2020-01-03 VITALS — BP 126/66 | HR 80 | Temp 97.9°F | Ht 64.0 in | Wt 181.4 lb

## 2020-01-03 DIAGNOSIS — R05 Cough: Secondary | ICD-10-CM

## 2020-01-03 DIAGNOSIS — R918 Other nonspecific abnormal finding of lung field: Secondary | ICD-10-CM | POA: Diagnosis not present

## 2020-01-03 DIAGNOSIS — R059 Cough, unspecified: Secondary | ICD-10-CM

## 2020-01-03 MED ORDER — BENZONATATE 100 MG PO CAPS: 200.0000 mg | ORAL_CAPSULE | Freq: Three times a day (TID) | ORAL | 3 refills | Status: DC | PRN
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
## 2020-01-09 ENCOUNTER — Ambulatory Visit (HOSPITAL_COMMUNITY)
Admission: RE | Admit: 2020-01-09 | Discharge: 2020-01-09 | Disposition: A | Discharge status: Home / Self Care | Payer: Medicare Other | Source: Ambulatory Visit | Attending: Internal Medicine | Admitting: Internal Medicine

## 2020-01-09 ENCOUNTER — Other Ambulatory Visit: Payer: Self-pay

## 2020-01-09 DIAGNOSIS — J439 Emphysema, unspecified: Secondary | ICD-10-CM | POA: Insufficient documentation

## 2020-01-09 DIAGNOSIS — R918 Other nonspecific abnormal finding of lung field: Secondary | ICD-10-CM | POA: Insufficient documentation

## 2020-01-09 DIAGNOSIS — I700 Atherosclerosis of aorta: Secondary | ICD-10-CM | POA: Insufficient documentation

## 2020-01-09 DIAGNOSIS — I2510 Atherosclerotic heart disease of native coronary artery without angina pectoris: Secondary | ICD-10-CM | POA: Insufficient documentation

## 2020-01-09 LAB — GLUCOSE, CAPILLARY: Glucose-Capillary: 123 mg/dL — ABNORMAL HIGH (ref 70–99)

## 2020-01-09 MED ORDER — FLUDEOXYGLUCOSE F - 18 (FDG) INJECTION
9.6700 | Freq: Once | INTRAVENOUS | Status: AC | PRN
  Administered 2020-01-09: 9.67 via INTRAVENOUS

## 2020-01-10 ENCOUNTER — Telehealth: Payer: Self-pay | Admitting: Internal Medicine

## 2020-01-10 DIAGNOSIS — R918 Other nonspecific abnormal finding of lung field: Secondary | ICD-10-CM

## 2020-01-10 NOTE — Telephone Encounter (Signed)
Spoke with Hailey Cobb and advised her that I would send this message to Dr. Shearon Stalls so she can review. Please advise.    Plan/Recommendations: Hailey Cobb is a very pleasant 86 y.o. woman with history of recent vulvar cancer s/p radiation who presents with new LLL lung mass.  Will proceed with PET scan. If no lymph node involvement will consider CT guided biopsy. Otherwise probably needs EBUS +/- Nav bronchosocopy. Will discuss with colleagues regarding appropriateness of either procedure pending results.  We discussed disease management and progression at length today, as well as next steps.   I spent 65 minutes in the care of this patient today including pre-charting, chart review, review of results, face-to-face care, coordination of care and communication with consultants etc.).

## 2020-01-11 ENCOUNTER — Other Ambulatory Visit: Payer: Self-pay | Admitting: Emergency Medicine

## 2020-01-11 ENCOUNTER — Telehealth: Payer: Self-pay | Admitting: Internal Medicine

## 2020-01-11 DIAGNOSIS — R918 Other nonspecific abnormal finding of lung field: Secondary | ICD-10-CM

## 2020-01-11 DIAGNOSIS — Z5181 Encounter for therapeutic drug level monitoring: Secondary | ICD-10-CM

## 2020-01-11 NOTE — Telephone Encounter (Signed)
Patient had daughter call . Gave her the instructions for procedure.  Daughter states patient is not on plavix and I corrected per RB recommendations to hold ASA for 2 days not 3.  No further questions at this time Nothing further needed at this time.   Will route to RB as FYI

## 2020-01-11 NOTE — Progress Notes (Signed)
ATC patient unable to reach .  Please-Let them know their Bronch is scheduled for 6//3/21 at [facility] with Dr. Lamonte Sakai at 1100.  Patient was instructed to arrive at hospital at 0930. They were instructed to bring someone with them as they will not be able to drive home from procedure. Patient instructed not to have anything to eat or drink after midnight. Patient needs to hold their blood thinner Aspirin 3 days prior and Plavix 5 day prior to procedure.   Patient's covid screening is scheduled at [facility] for 01/16/20 at 1:30pm.  Please route to RB once we hear from patient.

## 2020-01-11 NOTE — Telephone Encounter (Signed)
Thank you :)

## 2020-01-11 NOTE — Progress Notes (Signed)
Reviewed CT scan of the chest.  I believe that standard bronchoscopy under MAC or conscious sedation would be appropriate will work on arranging next week.    Please schedule the following: Printice Hellmer to perform  Diagnosis: Left lower lobe mass Procedure: Standard bronchoscopy Anesthesia: MAC Do you need Fluro?  Yes Priority: Urgent Date: Anytime 6/2-6/4 Time: Either Location: [facility] Does patient have OSA?  No DM? Yes Or Latex allergy?  No Medication Restriction: none Anticoagulate/Antiplatelet: hold ASA x 2 days, hold plavix x 5 days Pre-op Labs Ordered: CBC, CMP, PT/INR, PTT Imaging request: none   Please coordinate Pre-op COVID Testing

## 2020-01-11 NOTE — Addendum Note (Signed)
Addended by: Amado Coe on: 01/11/2020 03:52 PM   Modules accepted: Orders

## 2020-01-11 NOTE — Telephone Encounter (Signed)
Please schedule the following: Diagnosis: Lung Cancer Procedure: Bronchoscopy  Anesthesia: Yes Do you need Fluro? no Priority: urgent for lung cancer Date: week of June 1st (not Tuesday morning,) per Dr Lamonte Sakai who will be performing procedure at [facility] Alternate Date:  Time: per Dr Lamonte Sakai.  Location: [facility] Does patient have OSA? no DM? no Or Latex allergy? no Medication Restriction: none Anticoagulate/Antiplatelet: yes on asa and plavix which plavix should be held for 5 days prior to procedure. Pre-op Labs Ordered: CBC, CMP, PT/INR, PTT Imaging request: none - PET scan already done in chart. (If, SuperDimension CT Chest, please have STAT courier sent to [facility].) Please coordinate Pre-op COVID Testing

## 2020-01-11 NOTE — Telephone Encounter (Signed)
Patient needs bronchoscopy with biopsy of left lower lobe lung mass. Needs flexible bronchoscopy - not Nav and no fluoro needed. To be performed by Dr. Lamonte Sakai. Discussed plan with patient and her daughter.

## 2020-01-11 NOTE — Telephone Encounter (Signed)
See other phone note with Dr. Agustina Caroli requests

## 2020-01-11 NOTE — Telephone Encounter (Signed)
ATC patient unable to reach .  Please-Let them know their Bronch is scheduled for 6//3/21 at [facility] with Dr. Lamonte Sakai at 1100.  Patient was instructed to arrive at hospital at 0930. They were instructed to bring someone with them as they will not be able to drive home from procedure. Patient instructed not to have anything to eat or drink after midnight. Patient needs to hold their blood thinner Aspirin 3 days prior and Plavix 5 day prior to procedure.   Patient's covid screening is scheduled at [facility] for 01/16/20 at 1:30pm.  Pre-op labs have already been ordered Please route to RB once we hear from patient.

## 2020-01-12 DIAGNOSIS — D510 Vitamin B12 deficiency anemia due to intrinsic factor deficiency: Secondary | ICD-10-CM | POA: Diagnosis not present

## 2020-01-16 ENCOUNTER — Other Ambulatory Visit: Payer: Medicare Other

## 2020-01-16 ENCOUNTER — Telehealth: Payer: Self-pay | Admitting: Internal Medicine

## 2020-01-16 ENCOUNTER — Other Ambulatory Visit (HOSPITAL_COMMUNITY)
Admission: RE | Admit: 2020-01-16 | Discharge: 2020-01-16 | Disposition: A | Discharge status: Home / Self Care | Payer: Medicare Other | Source: Ambulatory Visit | Attending: Emergency Medicine | Admitting: Emergency Medicine

## 2020-01-16 DIAGNOSIS — R490 Dysphonia: Secondary | ICD-10-CM | POA: Diagnosis not present

## 2020-01-16 DIAGNOSIS — Z79890 Hormone replacement therapy (postmenopausal): Secondary | ICD-10-CM | POA: Diagnosis not present

## 2020-01-16 DIAGNOSIS — E669 Obesity, unspecified: Secondary | ICD-10-CM | POA: Diagnosis not present

## 2020-01-16 DIAGNOSIS — J3801 Paralysis of vocal cords and larynx, unilateral: Secondary | ICD-10-CM | POA: Diagnosis not present

## 2020-01-16 DIAGNOSIS — G40909 Epilepsy, unspecified, not intractable, without status epilepticus: Secondary | ICD-10-CM | POA: Diagnosis not present

## 2020-01-16 DIAGNOSIS — R918 Other nonspecific abnormal finding of lung field: Secondary | ICD-10-CM

## 2020-01-16 DIAGNOSIS — Z7984 Long term (current) use of oral hypoglycemic drugs: Secondary | ICD-10-CM | POA: Diagnosis not present

## 2020-01-16 DIAGNOSIS — J9811 Atelectasis: Secondary | ICD-10-CM | POA: Diagnosis not present

## 2020-01-16 DIAGNOSIS — E039 Hypothyroidism, unspecified: Secondary | ICD-10-CM | POA: Diagnosis not present

## 2020-01-16 DIAGNOSIS — I110 Hypertensive heart disease with heart failure: Secondary | ICD-10-CM | POA: Diagnosis not present

## 2020-01-16 DIAGNOSIS — Z6831 Body mass index (BMI) 31.0-31.9, adult: Secondary | ICD-10-CM | POA: Diagnosis not present

## 2020-01-16 DIAGNOSIS — I2510 Atherosclerotic heart disease of native coronary artery without angina pectoris: Secondary | ICD-10-CM | POA: Diagnosis not present

## 2020-01-16 DIAGNOSIS — E119 Type 2 diabetes mellitus without complications: Secondary | ICD-10-CM | POA: Diagnosis not present

## 2020-01-16 DIAGNOSIS — J90 Pleural effusion, not elsewhere classified: Secondary | ICD-10-CM | POA: Diagnosis not present

## 2020-01-16 DIAGNOSIS — Z952 Presence of prosthetic heart valve: Secondary | ICD-10-CM | POA: Diagnosis not present

## 2020-01-16 DIAGNOSIS — Z7902 Long term (current) use of antithrombotics/antiplatelets: Secondary | ICD-10-CM | POA: Diagnosis not present

## 2020-01-16 DIAGNOSIS — Z5181 Encounter for therapeutic drug level monitoring: Secondary | ICD-10-CM

## 2020-01-16 DIAGNOSIS — Z79899 Other long term (current) drug therapy: Secondary | ICD-10-CM | POA: Diagnosis not present

## 2020-01-16 DIAGNOSIS — Z96611 Presence of right artificial shoulder joint: Secondary | ICD-10-CM | POA: Diagnosis not present

## 2020-01-16 DIAGNOSIS — F419 Anxiety disorder, unspecified: Secondary | ICD-10-CM | POA: Diagnosis not present

## 2020-01-16 DIAGNOSIS — Z20822 Contact with and (suspected) exposure to covid-19: Secondary | ICD-10-CM | POA: Diagnosis not present

## 2020-01-16 DIAGNOSIS — I5032 Chronic diastolic (congestive) heart failure: Secondary | ICD-10-CM | POA: Diagnosis not present

## 2020-01-16 DIAGNOSIS — Z7982 Long term (current) use of aspirin: Secondary | ICD-10-CM | POA: Diagnosis not present

## 2020-01-16 DIAGNOSIS — J9601 Acute respiratory failure with hypoxia: Secondary | ICD-10-CM | POA: Diagnosis not present

## 2020-01-16 DIAGNOSIS — Z923 Personal history of irradiation: Secondary | ICD-10-CM | POA: Diagnosis not present

## 2020-01-16 LAB — COMPREHENSIVE METABOLIC PANEL
ALT: 16 U/L (ref 0–35)
AST: 21 U/L (ref 0–37)
Albumin: 3.3 g/dL — ABNORMAL LOW (ref 3.5–5.2)
Alkaline Phosphatase: 80 U/L (ref 39–117)
BUN: 17 mg/dL (ref 6–23)
CO2: 27 mEq/L (ref 19–32)
Calcium: 9.5 mg/dL (ref 8.4–10.5)
Chloride: 95 mEq/L — ABNORMAL LOW (ref 96–112)
Creatinine, Ser: 0.53 mg/dL (ref 0.40–1.20)
GFR: 109.4 mL/min (ref >60.00)
Glucose, Bld: 161 mg/dL — ABNORMAL HIGH (ref 70–99)
Potassium: 4.9 mEq/L (ref 3.5–5.1)
Sodium: 127 mEq/L — ABNORMAL LOW (ref 135–145)
Total Bilirubin: 0.4 mg/dL (ref 0.2–1.2)
Total Protein: 6.7 g/dL (ref 6.0–8.3)

## 2020-01-16 LAB — CBC WITH DIFFERENTIAL/PLATELET
Basophils Absolute: 0 10*3/uL (ref 0.0–0.1)
Basophils Relative: 0.3 % (ref 0.0–3.0)
Eosinophils Absolute: 0 10*3/uL (ref 0.0–0.7)
Eosinophils Relative: 0.7 % (ref 0.0–5.0)
HCT: 29.5 % — ABNORMAL LOW (ref 36.0–46.0)
Hemoglobin: 9.8 g/dL — ABNORMAL LOW (ref 12.0–15.0)
Lymphocytes Relative: 4.4 % — ABNORMAL LOW (ref 12.0–46.0)
Lymphs Abs: 0.2 10*3/uL — ABNORMAL LOW (ref 0.7–4.0)
MCHC: 33.4 g/dL (ref 30.0–36.0)
MCV: 88.8 fl (ref 78.0–100.0)
Monocytes Absolute: 0.6 10*3/uL (ref 0.1–1.0)
Monocytes Relative: 12 % (ref 3.0–12.0)
Neutro Abs: 4.1 10*3/uL (ref 1.4–7.7)
Neutrophils Relative %: 82.6 % — ABNORMAL HIGH (ref 43.0–77.0)
Platelets: 142 10*3/uL — ABNORMAL LOW (ref 150.0–400.0)
RBC: 3.32 Mil/uL — ABNORMAL LOW (ref 3.87–5.11)
RDW: 15.8 % — ABNORMAL HIGH (ref 11.5–15.5)
WBC: 5 10*3/uL (ref 4.0–10.5)

## 2020-01-16 LAB — PROTIME-INR
INR: 1.2 ratio — ABNORMAL HIGH (ref 0.8–1.0)
Prothrombin Time: 13.6 s — ABNORMAL HIGH (ref 9.6–13.1)

## 2020-01-16 LAB — APTT: aPTT: 32 s (ref 23.4–32.7)

## 2020-01-16 MED ORDER — HYDROCODONE-HOMATROPINE 5-1.5 MG/5ML PO SYRP: 5.0000 mL | ORAL_SOLUTION | Freq: Four times a day (QID) | ORAL | 0 refills | Status: DC | PRN

## 2020-01-16 NOTE — Telephone Encounter (Signed)
Spoke with the pt's daughter  She states that the tessalon is not helping pt's cough  She is taking 100 mg 2 TID  She is also taking robitussin DM and has tried mucinex and delsym and these have not helped  Cough is non prod and is very severe to the point of her stomach quivering and her chest and back feeling sore  She is not having any increased SOB or fever  Daughter is asking for something else to help with the cough  Please advise, thanks!  Allergies  Allergen Reactions  . Metoprolol Other (See Comments)    Bradycardia   . Clonidine Derivatives Other (See Comments)    Dizziness  . Glimepiride Other (See Comments)    Hypoglycemia  . Invokana [Canagliflozin] Other (See Comments)    Weakness, perineal irritation

## 2020-01-16 NOTE — Telephone Encounter (Signed)
Can try Hycodan cough syrup.  It is an opioid cough syrup, but should help with the cough suppression. I have sent to her [facility].

## 2020-01-16 NOTE — Telephone Encounter (Signed)
Spoke with the pt's daughter, [name], and notified of recs per Dr Shearon Stalls. She is aware Rx was already sent.

## 2020-01-17 ENCOUNTER — Encounter (HOSPITAL_COMMUNITY): Payer: Self-pay | Admitting: Emergency Medicine

## 2020-01-17 ENCOUNTER — Encounter (HOSPITAL_COMMUNITY): Payer: Medicare Other

## 2020-01-17 LAB — SARS CORONAVIRUS 2 (TAT 6-24 HRS): SARS Coronavirus 2: NEGATIVE

## 2020-01-18 ENCOUNTER — Inpatient Hospital Stay (HOSPITAL_COMMUNITY): Payer: Medicare Other

## 2020-01-18 ENCOUNTER — Encounter (HOSPITAL_COMMUNITY): Payer: Self-pay | Admitting: Emergency Medicine

## 2020-01-18 ENCOUNTER — Ambulatory Visit (HOSPITAL_COMMUNITY): Payer: Medicare Other | Admitting: Anesthesiology

## 2020-01-18 ENCOUNTER — Other Ambulatory Visit: Payer: Self-pay

## 2020-01-18 ENCOUNTER — Ambulatory Visit (HOSPITAL_COMMUNITY): Payer: Medicare Other

## 2020-01-18 ENCOUNTER — Inpatient Hospital Stay (HOSPITAL_COMMUNITY)
Admission: RE | Admit: 2020-01-18 | Discharge: 2020-01-19 | DRG: 189 | Disposition: A | Discharge status: Home / Self Care | Payer: Medicare Other | Attending: Emergency Medicine | Admitting: Emergency Medicine

## 2020-01-18 ENCOUNTER — Encounter (HOSPITAL_COMMUNITY)
Admission: RE | Disposition: A | Discharge status: Home / Self Care | Payer: Self-pay | Source: Ambulatory Visit | Attending: Emergency Medicine

## 2020-01-18 DIAGNOSIS — J9811 Atelectasis: Secondary | ICD-10-CM | POA: Diagnosis present

## 2020-01-18 DIAGNOSIS — J9601 Acute respiratory failure with hypoxia: Principal | ICD-10-CM | POA: Diagnosis present

## 2020-01-18 DIAGNOSIS — I110 Hypertensive heart disease with heart failure: Secondary | ICD-10-CM | POA: Diagnosis present

## 2020-01-18 DIAGNOSIS — J3801 Paralysis of vocal cords and larynx, unilateral: Secondary | ICD-10-CM | POA: Diagnosis present

## 2020-01-18 DIAGNOSIS — G40909 Epilepsy, unspecified, not intractable, without status epilepticus: Secondary | ICD-10-CM | POA: Diagnosis present

## 2020-01-18 DIAGNOSIS — J984 Other disorders of lung: Secondary | ICD-10-CM | POA: Diagnosis not present

## 2020-01-18 DIAGNOSIS — Z7984 Long term (current) use of oral hypoglycemic drugs: Secondary | ICD-10-CM | POA: Diagnosis not present

## 2020-01-18 DIAGNOSIS — Z7982 Long term (current) use of aspirin: Secondary | ICD-10-CM | POA: Diagnosis not present

## 2020-01-18 DIAGNOSIS — Z79899 Other long term (current) drug therapy: Secondary | ICD-10-CM

## 2020-01-18 DIAGNOSIS — R490 Dysphonia: Secondary | ICD-10-CM | POA: Diagnosis present

## 2020-01-18 DIAGNOSIS — Z20822 Contact with and (suspected) exposure to covid-19: Secondary | ICD-10-CM | POA: Diagnosis present

## 2020-01-18 DIAGNOSIS — Z96611 Presence of right artificial shoulder joint: Secondary | ICD-10-CM | POA: Diagnosis present

## 2020-01-18 DIAGNOSIS — Z952 Presence of prosthetic heart valve: Secondary | ICD-10-CM | POA: Diagnosis not present

## 2020-01-18 DIAGNOSIS — Z79890 Hormone replacement therapy (postmenopausal): Secondary | ICD-10-CM | POA: Diagnosis not present

## 2020-01-18 DIAGNOSIS — Z6831 Body mass index (BMI) 31.0-31.9, adult: Secondary | ICD-10-CM

## 2020-01-18 DIAGNOSIS — F419 Anxiety disorder, unspecified: Secondary | ICD-10-CM | POA: Diagnosis present

## 2020-01-18 DIAGNOSIS — E119 Type 2 diabetes mellitus without complications: Secondary | ICD-10-CM | POA: Diagnosis present

## 2020-01-18 DIAGNOSIS — R918 Other nonspecific abnormal finding of lung field: Secondary | ICD-10-CM | POA: Diagnosis present

## 2020-01-18 DIAGNOSIS — I2510 Atherosclerotic heart disease of native coronary artery without angina pectoris: Secondary | ICD-10-CM | POA: Diagnosis present

## 2020-01-18 DIAGNOSIS — Z9889 Other specified postprocedural states: Secondary | ICD-10-CM

## 2020-01-18 DIAGNOSIS — Z923 Personal history of irradiation: Secondary | ICD-10-CM | POA: Diagnosis not present

## 2020-01-18 DIAGNOSIS — I5032 Chronic diastolic (congestive) heart failure: Secondary | ICD-10-CM | POA: Diagnosis present

## 2020-01-18 DIAGNOSIS — Z87898 Personal history of other specified conditions: Secondary | ICD-10-CM

## 2020-01-18 DIAGNOSIS — E669 Obesity, unspecified: Secondary | ICD-10-CM | POA: Diagnosis present

## 2020-01-18 DIAGNOSIS — Z8544 Personal history of malignant neoplasm of other female genital organs: Secondary | ICD-10-CM

## 2020-01-18 DIAGNOSIS — J948 Other specified pleural conditions: Secondary | ICD-10-CM | POA: Diagnosis not present

## 2020-01-18 DIAGNOSIS — J90 Pleural effusion, not elsewhere classified: Secondary | ICD-10-CM | POA: Diagnosis present

## 2020-01-18 DIAGNOSIS — R0902 Hypoxemia: Secondary | ICD-10-CM

## 2020-01-18 DIAGNOSIS — I10 Essential (primary) hypertension: Secondary | ICD-10-CM | POA: Diagnosis present

## 2020-01-18 DIAGNOSIS — Z7902 Long term (current) use of antithrombotics/antiplatelets: Secondary | ICD-10-CM | POA: Diagnosis not present

## 2020-01-18 DIAGNOSIS — E039 Hypothyroidism, unspecified: Secondary | ICD-10-CM | POA: Diagnosis present

## 2020-01-18 DIAGNOSIS — K219 Gastro-esophageal reflux disease without esophagitis: Secondary | ICD-10-CM | POA: Diagnosis present

## 2020-01-18 LAB — BODY FLUID CELL COUNT WITH DIFFERENTIAL
Lymphs, Fluid: 25 %
Monocyte-Macrophage-Serous Fluid: 9 % — ABNORMAL LOW (ref 50–90)
Neutrophil Count, Fluid: 66 % — ABNORMAL HIGH (ref 0–25)
Total Nucleated Cell Count, Fluid: 1876 cu mm — ABNORMAL HIGH (ref 0–1000)

## 2020-01-18 LAB — CREATININE, SERUM
Creatinine, Ser: 0.62 mg/dL (ref 0.44–1.00)
GFR calc Af Amer: >60 mL/min (ref >60)
GFR calc non Af Amer: >60 mL/min (ref >60)

## 2020-01-18 LAB — LACTATE DEHYDROGENASE, PLEURAL OR PERITONEAL FLUID: LD, Fluid: 149 U/L — ABNORMAL HIGH (ref 3–23)

## 2020-01-18 LAB — CBC
HCT: 29.1 % — ABNORMAL LOW (ref 36.0–46.0)
Hemoglobin: 9.1 g/dL — ABNORMAL LOW (ref 12.0–15.0)
MCH: 28.6 pg (ref 26.0–34.0)
MCHC: 31.3 g/dL (ref 30.0–36.0)
MCV: 91.5 fL (ref 80.0–100.0)
Platelets: 152 10*3/uL (ref 150–400)
RBC: 3.18 MIL/uL — ABNORMAL LOW (ref 3.87–5.11)
RDW: 14.5 % (ref 11.5–15.5)
WBC: 4.2 10*3/uL (ref 4.0–10.5)
nRBC: 0 % (ref 0.0–0.2)

## 2020-01-18 LAB — GLUCOSE, CAPILLARY
Glucose-Capillary: 146 mg/dL — ABNORMAL HIGH (ref 70–99)
Glucose-Capillary: 126 mg/dL — ABNORMAL HIGH (ref 70–99)
Glucose-Capillary: 184 mg/dL — ABNORMAL HIGH (ref 70–99)
Glucose-Capillary: 112 mg/dL — ABNORMAL HIGH (ref 70–99)

## 2020-01-18 LAB — HEMOGLOBIN A1C
Hgb A1c MFr Bld: 6.5 % — ABNORMAL HIGH (ref 4.8–5.6)
Mean Plasma Glucose: 139.85 mg/dL

## 2020-01-18 LAB — PROTEIN, TOTAL: Total Protein: 6.4 g/dL — ABNORMAL LOW (ref 6.5–8.1)

## 2020-01-18 LAB — MRSA PCR SCREENING: MRSA by PCR: NEGATIVE

## 2020-01-18 SURGERY — CANCELLED PROCEDURE
Anesthesia: Monitor Anesthesia Care | Laterality: Left

## 2020-01-18 MED ORDER — ALBUTEROL SULFATE (2.5 MG/3ML) 0.083% IN NEBU
INHALATION_SOLUTION | RESPIRATORY_TRACT | Status: AC
  Filled 2020-01-18: qty 3

## 2020-01-18 MED ORDER — IOHEXOL 350 MG/ML SOLN
100.0000 mL | Freq: Once | INTRAVENOUS | Status: AC | PRN
  Administered 2020-01-18: 100 mL via INTRAVENOUS

## 2020-01-18 MED ORDER — SODIUM CHLORIDE 0.9% FLUSH
3.0000 mL | Freq: Two times a day (BID) | INTRAVENOUS | Status: DC
  Administered 2020-01-18 – 2020-01-19 (×2): 3 mL via INTRAVENOUS

## 2020-01-18 MED ORDER — ALBUTEROL SULFATE (2.5 MG/3ML) 0.083% IN NEBU: 2.5000 mg | INHALATION_SOLUTION | RESPIRATORY_TRACT | Status: DC | PRN

## 2020-01-18 MED ORDER — ALPRAZOLAM 0.5 MG PO TABS
0.5000 mg | ORAL_TABLET | Freq: Three times a day (TID) | ORAL | Status: DC | PRN
  Administered 2020-01-18: 0.5 mg via ORAL
  Filled 2020-01-18: qty 1

## 2020-01-18 MED ORDER — POLYVINYL ALCOHOL 1.4 % OP SOLN: 1.0000 [drp] | Freq: Every day | OPHTHALMIC | Status: DC

## 2020-01-18 MED ORDER — PANTOPRAZOLE SODIUM 40 MG PO TBEC: 40.0000 mg | DELAYED_RELEASE_TABLET | Freq: Every day | ORAL | Status: DC | PRN

## 2020-01-18 MED ORDER — ASPIRIN 81 MG PO CHEW
81.0000 mg | CHEWABLE_TABLET | Freq: Every day | ORAL | Status: DC
  Administered 2020-01-18 – 2020-01-19 (×2): 81 mg via ORAL
  Filled 2020-01-18 (×2): qty 1

## 2020-01-18 MED ORDER — FUROSEMIDE 20 MG PO TABS
20.0000 mg | ORAL_TABLET | Freq: Every day | ORAL | Status: DC
  Administered 2020-01-19: 20 mg via ORAL
  Filled 2020-01-18 (×2): qty 1

## 2020-01-18 MED ORDER — LACTATED RINGERS IV SOLN: INTRAVENOUS | Status: AC | PRN

## 2020-01-18 MED ORDER — LEVOTHYROXINE SODIUM 25 MCG PO TABS
125.0000 ug | ORAL_TABLET | Freq: Every day | ORAL | Status: DC
  Administered 2020-01-19: 125 ug via ORAL
  Filled 2020-01-18: qty 1

## 2020-01-18 MED ORDER — ORAL CARE MOUTH RINSE
15.0000 mL | Freq: Two times a day (BID) | OROMUCOSAL | Status: DC
  Administered 2020-01-18: 15 mL via OROMUCOSAL

## 2020-01-18 MED ORDER — LINAGLIPTIN 5 MG PO TABS
5.0000 mg | ORAL_TABLET | Freq: Every day | ORAL | Status: DC
  Administered 2020-01-18 – 2020-01-19 (×2): 5 mg via ORAL
  Filled 2020-01-18 (×2): qty 1

## 2020-01-18 MED ORDER — ROSUVASTATIN CALCIUM 20 MG PO TABS
20.0000 mg | ORAL_TABLET | ORAL | Status: DC
  Administered 2020-01-19: 20 mg via ORAL
  Filled 2020-01-18: qty 1

## 2020-01-18 MED ORDER — SODIUM CHLORIDE 0.9% FLUSH: 3.0000 mL | INTRAVENOUS | Status: DC | PRN

## 2020-01-18 MED ORDER — METFORMIN HCL 500 MG PO TABS
1000.0000 mg | ORAL_TABLET | Freq: Two times a day (BID) | ORAL | Status: DC
  Administered 2020-01-18 – 2020-01-19 (×2): 1000 mg via ORAL
  Filled 2020-01-18 (×3): qty 2

## 2020-01-18 MED ORDER — IRBESARTAN 150 MG PO TABS
150.0000 mg | ORAL_TABLET | Freq: Two times a day (BID) | ORAL | Status: DC
  Administered 2020-01-18 – 2020-01-19 (×2): 150 mg via ORAL
  Filled 2020-01-18 (×3): qty 1

## 2020-01-18 MED ORDER — HYDROCODONE-ACETAMINOPHEN 5-325 MG PO TABS
1.0000 | ORAL_TABLET | ORAL | Status: DC | PRN
  Administered 2020-01-18: 2 via ORAL
  Administered 2020-01-19: 1 via ORAL
  Filled 2020-01-18: qty 2
  Filled 2020-01-18: qty 1

## 2020-01-18 MED ORDER — CHLORHEXIDINE GLUCONATE CLOTH 2 % EX PADS
6.0000 | MEDICATED_PAD | Freq: Every day | CUTANEOUS | Status: DC
  Administered 2020-01-18 – 2020-01-19 (×2): 6 via TOPICAL

## 2020-01-18 MED ORDER — LEVETIRACETAM 500 MG PO TABS
500.0000 mg | ORAL_TABLET | Freq: Every day | ORAL | Status: DC
  Administered 2020-01-19: 500 mg via ORAL
  Filled 2020-01-18: qty 1

## 2020-01-18 MED ORDER — METFORMIN HCL 500 MG PO TABS: 1000.0000 mg | ORAL_TABLET | Freq: Two times a day (BID) | ORAL | Status: DC

## 2020-01-18 MED ORDER — POLYVINYL ALCOHOL 1.4 % OP SOLN
1.0000 [drp] | Freq: Every day | OPHTHALMIC | Status: DC
  Administered 2020-01-19: 1 [drp] via OPHTHALMIC
  Filled 2020-01-18: qty 15

## 2020-01-18 MED ORDER — DOXAZOSIN MESYLATE 8 MG PO TABS
8.0000 mg | ORAL_TABLET | Freq: Every day | ORAL | Status: DC
  Administered 2020-01-18: 8 mg via ORAL
  Filled 2020-01-18: qty 8

## 2020-01-18 MED ORDER — AZELASTINE HCL 0.1 % NA SOLN
2.0000 | Freq: Every day | NASAL | Status: DC
  Administered 2020-01-18: 2 via NASAL
  Filled 2020-01-18: qty 30

## 2020-01-18 MED ORDER — HEPARIN SODIUM (PORCINE) 5000 UNIT/ML IJ SOLN
5000.0000 [IU] | Freq: Three times a day (TID) | INTRAMUSCULAR | Status: DC
  Administered 2020-01-18 – 2020-01-19 (×4): 5000 [IU] via SUBCUTANEOUS
  Filled 2020-01-18 (×4): qty 1

## 2020-01-18 MED ORDER — HYDROCODONE-HOMATROPINE 5-1.5 MG/5ML PO SYRP: 5.0000 mL | ORAL_SOLUTION | Freq: Four times a day (QID) | ORAL | Status: DC | PRN

## 2020-01-18 MED ORDER — GUAIFENESIN ER 1200 MG PO TB12: 1200.0000 mg | ORAL_TABLET | Freq: Two times a day (BID) | ORAL | Status: DC

## 2020-01-18 MED ORDER — DOXAZOSIN MESYLATE 1 MG PO TABS
4.0000 mg | ORAL_TABLET | Freq: Every morning | ORAL | Status: DC
  Administered 2020-01-19: 4 mg via ORAL
  Filled 2020-01-18: qty 4

## 2020-01-18 MED ORDER — AZELASTINE HCL 0.15 % NA SOLN: 2.0000 | Freq: Every day | NASAL | Status: DC

## 2020-01-18 MED ORDER — POLYSACCHARIDE IRON COMPLEX 150 MG PO CAPS
150.0000 mg | ORAL_CAPSULE | Freq: Every day | ORAL | Status: DC
  Administered 2020-01-19: 150 mg via ORAL
  Filled 2020-01-18: qty 1

## 2020-01-18 MED ORDER — SODIUM CHLORIDE 0.9 % IV SOLN: 250.0000 mL | INTRAVENOUS | Status: DC | PRN

## 2020-01-18 MED ORDER — AMLODIPINE BESYLATE 10 MG PO TABS
10.0000 mg | ORAL_TABLET | Freq: Every day | ORAL | Status: DC
  Administered 2020-01-19: 10 mg via ORAL
  Filled 2020-01-18: qty 1

## 2020-01-18 MED ORDER — INSULIN ASPART 100 UNIT/ML ~~LOC~~ SOLN: 0.0000 [IU] | Freq: Every day | SUBCUTANEOUS | Status: DC

## 2020-01-18 MED ORDER — ALBUTEROL SULFATE (2.5 MG/3ML) 0.083% IN NEBU
2.5000 mg | INHALATION_SOLUTION | Freq: Once | RESPIRATORY_TRACT | Status: AC
  Administered 2020-01-18: 2.5 mg via RESPIRATORY_TRACT

## 2020-01-18 MED ORDER — ADULT MULTIVITAMIN W/MINERALS CH
1.0000 | ORAL_TABLET | Freq: Every day | ORAL | Status: DC
  Administered 2020-01-18 – 2020-01-19 (×2): 1 via ORAL
  Filled 2020-01-18 (×2): qty 1

## 2020-01-18 MED ORDER — INSULIN ASPART 100 UNIT/ML ~~LOC~~ SOLN
0.0000 [IU] | Freq: Three times a day (TID) | SUBCUTANEOUS | Status: DC
  Administered 2020-01-18: 2 [IU] via SUBCUTANEOUS
  Administered 2020-01-19 (×2): 1 [IU] via SUBCUTANEOUS

## 2020-01-18 MED ORDER — GUAIFENESIN ER 600 MG PO TB12
1200.0000 mg | ORAL_TABLET | Freq: Two times a day (BID) | ORAL | Status: DC
  Administered 2020-01-18 – 2020-01-19 (×2): 1200 mg via ORAL
  Filled 2020-01-18 (×2): qty 2

## 2020-01-18 NOTE — Progress Notes (Signed)
Bronchoscopy cancelled for now. Patient admitted to hospital per Dr. Lamonte Sakai.

## 2020-01-18 NOTE — Transfer of Care (Signed)
Case cancelled
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
## 2020-01-18 NOTE — Progress Notes (Signed)
Pt admitted for procedure - SpO2 on room air 75%.  Pt placed on 3L [place] and SpO2 improved to 91%.  Pt DOES NOT have home oxygen.  Dr. Kalman Shan notified.  Albuterol tx ordered and administered.  Will notify Dr. Lamonte Sakai of SpO2 prior to procedure start.  Will continue to monitor pt.

## 2020-01-18 NOTE — Anesthesia Preprocedure Evaluation (Addendum)
Anesthesia Evaluation  Patient identified by MRN, date of birth, ID band Patient awake    Reviewed: Allergy & Precautions, NPO status , Patient's Chart, lab work & pertinent test results  Airway Mallampati: II  TM Distance: >3 FB Neck ROM: Full    Dental no notable dental hx.    Pulmonary COPD,  Room air saturation 75%  Chronic cough  Poor inspiratory effort  + rhonchi  + decreased breath sounds      Cardiovascular hypertension, +CHF  Normal cardiovascular exam+ dysrhythmias Supra Ventricular Tachycardia + Valvular Problems/Murmurs AS  Rhythm:Regular Rate:Normal  S/P TAVR   Neuro/Psych negative neurological ROS  negative psych ROS   GI/Hepatic Neg liver ROS, GERD  ,  Endo/Other  diabetesHypothyroidism   Renal/GU negative Renal ROS  negative genitourinary   Musculoskeletal negative musculoskeletal ROS (+)   Abdominal   Peds negative pediatric ROS (+)  Hematology negative hematology ROS (+)   Anesthesia Other Findings   Reproductive/Obstetrics negative OB ROS                            Anesthesia Physical Anesthesia Plan  ASA: IV  Anesthesia Plan: MAC   Post-op Pain Management:    Induction: Intravenous  PONV Risk Score and Plan:   Airway Management Planned: Nasal Cannula  Additional Equipment:   Intra-op Plan:   Post-operative Plan:   Informed Consent: I have reviewed the patients History and Physical, chart, labs and discussed the procedure including the risks, benefits and alternatives for the proposed anesthesia with the patient or authorized representative who has indicated his/her understanding and acceptance.     Dental advisory given  Plan Discussed with: CRNA and Surgeon  Anesthesia Plan Comments: (May have to be admitted)       Anesthesia Quick Evaluation

## 2020-01-19 ENCOUNTER — Inpatient Hospital Stay (HOSPITAL_COMMUNITY): Payer: Medicare Other

## 2020-01-19 ENCOUNTER — Telehealth: Payer: Self-pay | Admitting: Internal Medicine

## 2020-01-19 LAB — GLUCOSE, CAPILLARY
Glucose-Capillary: 122 mg/dL — ABNORMAL HIGH (ref 70–99)
Glucose-Capillary: 141 mg/dL — ABNORMAL HIGH (ref 70–99)

## 2020-01-19 LAB — BASIC METABOLIC PANEL
Anion gap: 8 (ref 5–15)
BUN: 17 mg/dL (ref 8–23)
CO2: 28 mmol/L (ref 22–32)
Calcium: 9 mg/dL (ref 8.9–10.3)
Chloride: 97 mmol/L — ABNORMAL LOW (ref 98–111)
Creatinine, Ser: 0.53 mg/dL (ref 0.44–1.00)
GFR calc Af Amer: >60 mL/min (ref >60)
GFR calc non Af Amer: >60 mL/min (ref >60)
Glucose, Bld: 132 mg/dL — ABNORMAL HIGH (ref 70–99)
Potassium: 5.1 mmol/L (ref 3.5–5.1)
Sodium: 133 mmol/L — ABNORMAL LOW (ref 135–145)

## 2020-01-19 LAB — MAGNESIUM: Magnesium: 1.6 mg/dL — ABNORMAL LOW (ref 1.7–2.4)

## 2020-01-19 LAB — CBC
HCT: 27.5 % — ABNORMAL LOW (ref 36.0–46.0)
Hemoglobin: 8.6 g/dL — ABNORMAL LOW (ref 12.0–15.0)
MCH: 28.9 pg (ref 26.0–34.0)
MCHC: 31.3 g/dL (ref 30.0–36.0)
MCV: 92.3 fL (ref 80.0–100.0)
Platelets: 138 10*3/uL — ABNORMAL LOW (ref 150–400)
RBC: 2.98 MIL/uL — ABNORMAL LOW (ref 3.87–5.11)
RDW: 14.7 % (ref 11.5–15.5)
WBC: 3.6 10*3/uL — ABNORMAL LOW (ref 4.0–10.5)
nRBC: 0 % (ref 0.0–0.2)

## 2020-01-19 LAB — PROTEIN, TOTAL: Total Protein: 6 g/dL — ABNORMAL LOW (ref 6.5–8.1)

## 2020-01-19 LAB — LACTATE DEHYDROGENASE: LDH: 124 U/L (ref 98–192)

## 2020-01-19 NOTE — Discharge Summary (Signed)
Physician Discharge Summary         Patient ID: Hailey Cobb MRN: [id number] DOB/AGE: 1931-10-22 86 y.o.  Admit date: 01/18/2020 Discharge date: 01/19/2020  Discharge Diagnoses:    Left lower lobe lung mass Exudative pleural effusion Acute hypoxic respiratory failure  hypothyroidism Hypertension Diabetes seizure disorder, Unilateral vocal cord paralysis    Discharge summary    86 year old female never smoker history of vulvar cancer treated with radiation.  Also had TAVR done in July 2020 has been off anticoagulation since April.  Seen in our office initially in May with chief complaint of persistent cough and chest x-ray showing new left lower lobe lung mass and small left effusion follow-up PET scanning demonstrate mass to be hypermetabolic.  She had been having new left flank pain, was to undergo bronchoscopy and video biopsy on 6/3 however on presentation had room air pulse oximetry in the 70s a chest x-ray was done showing a very large left pleural effusion, follow-up CT scan confirming this.  She was admitted for new acute hypoxic respiratory failure.  Shortly after admission she underwent a left-sided thoracentesis in which we removed 1.2 L of pleural fluid this was exudate by analysis and was sent for evaluation.  After this her work of breathing was much improved, walking oximetry was repeated prior to discharge. We noted her oxygen level went to as low as 87% requiring 2 liters to bring above 90%.  She was discharged home with plan to follow-up on pleural cytology, and to proceed with further evaluation pending results.  Bronchoscopy was put on hold until results of pleural cytology obtained  Discharge Plan by Active Problems    Acute hypoxic respiratory failure.  Suspect most likely secondary to large volume pleural effusion and resultant atelectasis.  This is much improved following thoracentesis She does still have mild exertional hypoxia as low as 87% Plan Home on  oxygen, 2 L via nasal cannula with exertion  Left lower lobe lung mass with exudative pleural effusion She is now status post diagnostic and therapeutic thoracentesis on 6/4 Plan Follow-up in our office next week to review pleural cytology If negative we will need to set her up for bronchoscopy once again  Hypertension Plan Resume home amlodipine doxazosin and Lasix  History of TAVR Plan Continue home medications  History of seizure disorder Plan Home on Duck  History of hypothyroidism Plan Home on Synthroid  History of diabetes Plan Resume home medications  History of anxiety [facility] tests/ studies  Left thoracentesis: exudative by lights criteria. 1.2 liters removed 1. No CT findings for pulmonary embolism.2. Stable changes from a TAVR. No aortic aneurysm or dissection. 3. Enlarging moderate-sized left pleural effusion with complete left lower lobe atelectasis surrounding the left lower lobe cavitary mass. The left lower lobe bronchi are filled with fluid and debris. There is also moderate compressive atelectasis in the lingula.4. Very small right pleural effusion.5. No worrisome right lung lesions, infiltrates or significant atelectasis.6. Aortic atherosclerosis. Procedures   Left thoracentesis: exudative by lights criteria. 1.2 liters removed  Culture data/antimicrobials   Pleural fluid 6/3>>>   Consults    Discharge Exam: Blood Pressure (Abnormal) 168/59 (BP Location: Right Leg)   Pulse 78   Temperature (Abnormal) 97.4 F (36.3 C) (Oral)   Respiration (Abnormal) 21   Height 5\' 4"  (1.626 m)   Weight 82.1 kg   Last Menstrual Period  (LMP Unknown)   Oxygen Saturation 91%   Body Mass Index 31.07 kg/m  General this is a pleasant 86 year old white female she is resting in bed and in no acute distress this morning HEENT normocephalic atraumatic no jugular venous distention appreciated Pulmonary: Clear to auscultation without  accessory use Cardiac: Regular rate and rhythm Abdomen: Soft nontender Extremities: Warm dry brisk cap refill Neuro: Awake oriented no focal deficits GU: Voiding Labs at discharge   Lab Results  Component Value Date   CREATININE 0.53 01/19/2020   BUN 17 01/19/2020   NA 133 (L) 01/19/2020   K 5.1 01/19/2020   CL 97 (L) 01/19/2020   CO2 28 01/19/2020   Lab Results  Component Value Date   WBC 3.6 (L) 01/19/2020   HGB 8.6 (L) 01/19/2020   HCT 27.5 (L) 01/19/2020   MCV 92.3 01/19/2020   PLT 138 (L) 01/19/2020   Lab Results  Component Value Date   ALT 16 01/16/2020   AST 21 01/16/2020   ALKPHOS 80 01/16/2020   BILITOT 0.4 01/16/2020   Lab Results  Component Value Date   INR 1.2 (H) 01/16/2020   INR 1.0 05/15/2019   INR 1.0 05/11/2019    Current radiological studies    CT ANGIO CHEST PE W OR WO CONTRAST  Result Date: 01/18/2020 CLINICAL DATA:  Left lower lobe lung mass with weakness and cough. EXAM: CT ANGIOGRAPHY CHEST WITH CONTRAST TECHNIQUE: Multidetector CT imaging of the chest was performed using the standard protocol during bolus administration of intravenous contrast. Multiplanar CT image reconstructions and MIPs were obtained to evaluate the vascular anatomy. CONTRAST:  161mL OMNIPAQUE IOHEXOL 350 MG/ML SOLN COMPARISON:  Chest CT 12/29/2019 and PET-CT 01/09/2020 FINDINGS: Cardiovascular: Stable mild cardiac enlargement. No pericardial effusion. Stable changes from a TAVR. No aortic aneurysm or dissection. Minimal scattered atherosclerotic calcifications for age. The pulmonary arterial tree is fairly well opacified. No definite filling defects to suggest pulmonary embolism. Mediastinum/Nodes: Small scattered mediastinal and hilar lymph nodes are stable. No mass or overt adenopathy. The esophagus is grossly normal. Lungs/Pleura: Enlarging moderate-sized left pleural effusion progressive since the PET-CT of 01/09/2020. There is complete left lower lobe atelectasis surrounding  the left lower lobe cavitary mass. The left lower lobe bronchi are filled with fluid and debris. There is also moderate compressive atelectasis in the lingula. The right lung is relatively clear. No worrisome right lung lesions, infiltrates or significant atelectasis. Very small right pleural effusion. Upper Abdomen: No significant upper abdominal findings are identified. No hepatic or adrenal gland lesions are identified. Musculoskeletal: No significant bony findings. Review of the MIP images confirms the above findings. IMPRESSION: 1. No CT findings for pulmonary embolism. 2. Stable changes from a TAVR. No aortic aneurysm or dissection. 3. Enlarging moderate-sized left pleural effusion with complete left lower lobe atelectasis surrounding the left lower lobe cavitary mass. The left lower lobe bronchi are filled with fluid and debris. There is also moderate compressive atelectasis in the lingula. 4. Very small right pleural effusion. 5. No worrisome right lung lesions, infiltrates or significant atelectasis. 6. Aortic atherosclerosis. Aortic Atherosclerosis (ICD10-I70.0). Electronically Signed   By: Marijo Sanes M.D.   On: 01/18/2020 12:58   DG Chest Port 1 View  Result Date: 01/19/2020 CLINICAL DATA:  86 year old female with history of left pleural effusion. EXAM: PORTABLE CHEST 1 VIEW COMPARISON:  Chest x-ray 01/18/2020. FINDINGS: Lung volumes are slightly low. Opacity in the left base which may reflect atelectasis and/or consolidation. Moderate left pleural effusion slightly increased compared to the prior study. Right lung is clear. No right pleural effusion. No pneumothorax.  No evidence of pulmonary edema. Heart size is mildly enlarged. Upper mediastinal contours are within normal limits. Aortic atherosclerosis. Status post TAVR. Status post bilateral shoulder arthroplasty. IMPRESSION: 1. Persistent and increasing moderate left pleural effusion with left basilar atelectasis and/or consolidation. 2. Aortic  atherosclerosis. Electronically Signed   By: Vinnie Langton M.D.   On: 01/19/2020 08:07   DG Chest Port 1 View  Result Date: 01/18/2020 CLINICAL DATA:  Status post thoracentesis. EXAM: PORTABLE CHEST 1 VIEW COMPARISON:  Prior same day chest radiograph. FINDINGS: Decreased left pleural effusion now small in size. Left basilar/retrocardiac confluent opacities are also decreased. Background pulmonary edema with diffuse interstitial prominence and perihilar vessel engorgement. No pneumothorax. Partially obscured cardiomediastinal silhouette. Sequela of TAVR. Bilateral humeral prostheses. IMPRESSION: Small left pleural effusion and left basilar/retrocardiac opacities, decreased from prior exam. Electronically Signed   By: Primitivo Gauze M.D.   On: 01/18/2020 16:26   DG Chest Port 1 View  Result Date: 01/18/2020 CLINICAL DATA:  Hypoxemia. EXAM: PORTABLE CHEST 1 VIEW COMPARISON:  12/27/2019 FINDINGS: The patient has developed a large left pleural effusion since the prior study 12/27/2019. Overall heart size and pulmonary vascularity are normal. Right lung is clear. No acute bone abnormality. TAVR. Multiple surgical clips in the region of the thyroid consistent previous reported thyroidectomy. Bilateral proximal humeral prostheses, unchanged. IMPRESSION: New large left pleural effusion. Electronically Signed   By: Lorriane Shire M.D.   On: 01/18/2020 11:24    Disposition:      Discharge Instructions    For home use only DME oxygen   Complete by: As directed    Length of Need: 12 Months   Mode or (Route): Nasal cannula   Liters per Minute: 2   Frequency: Continuous (stationary and portable oxygen unit needed)   Oxygen conserving device: No   Oxygen delivery system: Gas      Allergies as of 01/19/2020    Allergen Reactions Comment   Metoprolol Other (See Comments) Bradycardia   Clonidine Derivatives Other (See Comments) Dizziness   Glimepiride Other (See Comments) Hypoglycemia   Invokana  [canagliflozin] Other (See Comments) Weakness, perineal irritation      Medication List    Stop taking these medications   clopidogrel 75 MG tablet Commonly known as: PLAVIX     Take these medications   ALPRAZolam 0.5 MG tablet Commonly known as: XANAX Take 0.5 mg by mouth 3 (three) times daily as needed for anxiety.   amLODipine 5 MG tablet Commonly known as: NORVASC Take 1 tablet (5 mg total) by mouth daily. What changed: how much to take   amoxicillin 500 MG tablet Commonly known as: AMOXIL Take 4 capsules (2,000mg ) one hour prior to all dental visits.   aspirin 81 MG chewable tablet Chew 81 mg by mouth daily.   Astepro 0.15 % Soln Generic drug: Azelastine HCl Place 2 sprays into both nostrils at bedtime.   B-12 COMPLIANCE INJECTION IJ Inject as directed every 30 (thirty) days.   benzonatate 100 MG capsule Commonly known as: Tessalon Perles Take 2 capsules (200 mg total) by mouth 3 (three) times daily as needed for cough.   doxazosin 2 MG tablet Commonly known as: CARDURA Take 4-8 mg by mouth See admin instructions. Take 4 mg by mouth in the morning and 8 mg at bedtime   furosemide 40 MG tablet Commonly known as: LASIX Take 0.5-1 tablets (20-40 mg total) by mouth daily. What changed:   when to take this  additional instructions   HYDROcodone-acetaminophen  5-325 MG tablet Commonly known as: NORCO/VICODIN Take 1-2 tablets by mouth every 4 (four) hours as needed for severe pain. Do not take and drive   HYDROcodone-homatropine 5-1.5 MG/5ML syrup Commonly known as: HYCODAN Take 5 mLs by mouth every 6 (six) hours as needed for cough.   irbesartan 150 MG tablet Commonly known as: AVAPRO Take 150 mg by mouth 2 (two) times daily.   iron polysaccharides 150 MG capsule Commonly known as: NIFEREX Take 1 capsule (150 mg total) by mouth daily.   levETIRAcetam 500 MG tablet Commonly known as: KEPPRA Take 500 mg by mouth daily.   levothyroxine 125 MCG  tablet Commonly known as: SYNTHROID Take 125 mcg by mouth daily before breakfast. What changed: Another medication with the same name was removed. Continue taking this medication, and follow the directions you see here.   metFORMIN 1000 MG tablet Commonly known as: GLUCOPHAGE Take 1,000 mg by mouth 2 (two) times daily with a meal.   Mucinex Maximum Strength 1200 MG Tb12 Generic drug: Guaifenesin Take 1,200 mg by mouth in the morning and at bedtime.   OVER THE COUNTER MEDICATION Take 3 tablets by mouth daily. Thymic factor vitamins   pantoprazole 40 MG tablet Commonly known as: PROTONIX Take 1 tablet (40 mg total) by mouth daily. What changed:   when to take this  reasons to take this   polyvinyl alcohol 1.4 % ophthalmic solution Commonly known as: LIQUIFILM TEARS Place 1 drop into both eyes daily.   PRESERVISION AREDS PO Take 1 tablet by mouth daily.   rosuvastatin 20 MG tablet Commonly known as: CRESTOR Take 20 mg by mouth every other day.   sitaGLIPtin 100 MG tablet Commonly known as: JANUVIA Take 100 mg by mouth daily.        Durable Medical Equipment  (From admission, onward)         Start     Ordered   01/19/20 0000  For home use only DME oxygen    Question Answer Comment  Length of Need 12 Months   Mode or (Route) Nasal cannula   Liters per Minute 2   Frequency Continuous (stationary and portable oxygen unit needed)   Oxygen conserving device No   Oxygen delivery system Gas      01/19/20 1057           Follow-up appointment   We will call the patient  Discharge Condition:    good  Physician Statement:   The Patient was personally examined, the discharge assessment and plan has been personally reviewed and I agree with ACNP Quartez Lagos's assessment and plan. 33 minutes of time have been dedicated to discharge assessment, planning and discharge instructions.   Signed: Clementeen Graham 01/19/2020, 10:57 AM

## 2020-01-19 NOTE — Progress Notes (Signed)
    Durable Medical Equipment  (From admission, onward)         Start     Ordered   01/19/20 0000  For home use only DME oxygen    Question Answer Comment  Length of Need 12 Months   Mode or (Route) Nasal cannula   Liters per Minute 2   Frequency Continuous (stationary and portable oxygen unit needed)   Oxygen conserving device No   Oxygen delivery system Gas      01/19/20 1057

## 2020-01-19 NOTE — Progress Notes (Signed)
Oxygen delivered to patient's room. Information provided by representative to patient and patient's daughter, Hailey Cobb. Discharge information provided by RN to patient and Hailey Cobb. All questions answered. Patient's belongings sent with her. PIV removed. Patient wheeled down and assisted to car by NT.

## 2020-01-19 NOTE — TOC Initial Note (Addendum)
Transition of Care [facility]) - Initial/Assessment Note    Patient Details  Name: Hailey Cobb MRN: [id number] Date of Birth: April 28, 1932  [facility] CM/SW Contact:    Leeroy Cha, RN Phone Number: 01/19/2020, 9:13 AM  Clinical Narrative:                 From home, married, has pcp Large plueral effusion, desats to 75% on room air on 4l o2 via [place]. Plan: will follow for dme and hhc needs. Home o2 ordered through adapt SATURATION QUALIFICATIONS: (This note is used to comply with regulatory documentation for home oxygen)  Patient Saturations on Room Air at Rest = 97%  Patient Saturations on Room Air while Ambulating = 87%  Patient Saturations on 2 Liters of oxygen while Ambulating = 92% Expected Discharge Plan: Home/Self Care Barriers to Discharge: Continued Medical Work up   Patient Goals and CMS Choice Patient states their goals for this hospitalization and ongoing recovery are:: to go back home CMS Medicare.gov Compare Post Acute Care list provided to:: Patient    Expected Discharge Plan and Services Expected Discharge Plan: Home/Self Care       Living arrangements for the past 2 months: Single Family Home                                      Prior Living Arrangements/Services Living arrangements for the past 2 months: Single Family Home Lives with:: Spouse Patient language and need for interpreter reviewed:: No        Need for Family Participation in Patient Care: Yes (Comment) Care giver support system in place?: Yes (comment)   Criminal Activity/Legal Involvement Pertinent to Current Situation/Hospitalization: No - Comment as needed  Activities of Daily Living Home Assistive Devices/Equipment: Dentures (specify type), Eyeglasses, Grab bars around toilet, Grab bars in shower, Raised toilet seat with rails, Shower chair with back, Bedside commode/3-in-1, Walker (specify type) ADL Screening (condition at time of admission) Patient's  cognitive ability adequate to safely complete daily activities?: Yes Is the patient deaf or have difficulty hearing?: No Does the patient have difficulty seeing, even when wearing glasses/contacts?: No Does the patient have difficulty concentrating, remembering, or making decisions?: No Patient able to express need for assistance with ADLs?: Yes Does the patient have difficulty dressing or bathing?: No Independently performs ADLs?: Yes (appropriate for developmental age) Does the patient have difficulty walking or climbing stairs?: No(Pt states she has a ramp; doesn't have steps) Weakness of Legs: Both Weakness of Arms/Hands: Both  Permission Sought/Granted                  Emotional Assessment Appearance:: Appears stated age Attitude/Demeanor/Rapport: Engaged Affect (typically observed): Calm Orientation: : Oriented to Place, Oriented to  Time, Oriented to Self, Oriented to Situation Alcohol / Substance Use: Not Applicable Psych Involvement: No (comment)  Admission diagnosis:  Acute respiratory failure with hypoxia ([place]) [J96.01] Patient Active Problem List   Diagnosis Date Noted  . Acute respiratory failure with hypoxia ([place]) 01/18/2020  . Cavitating mass in left lower lung lobe 01/18/2020  . Pleural effusion, left 01/18/2020  . B12 deficiency 09/08/2019  . History of seizures 05/21/2019  . Tachy-brady syndrome ([place]) 05/21/2019  . S/P TAVR (transcatheter aortic valve replacement)   . Syncope 05/11/2019  . Diarrhea 05/11/2019  . Acute GI bleeding 05/02/2019  . 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  Elevated troponin 12/18/2018  . HLD (hyperlipidemia) 12/17/2018  . GERD (gastroesophageal reflux disease) 12/17/2018  . Seizure ([place]) 12/17/2018  . Acute on chronic diastolic heart failure ([place]) 12/17/2018  . Ureteral calculus 08/11/2017  . Tachycardia-bradycardia syndrome ([place]) 05/01/2017  . Symptomatic bradycardia 02/06/2017  . Right bundle branch  block 07/30/2015  . Aortic valve stenosis 07/29/2015  . Diabetes mellitus without complication ([place]) 31/43/8887  . Acute left-sided weakness   . Diabetes mellitus ([place]) 07/23/2014  . Anxiety 07/23/2014  . Hypothyroidism 07/23/2014  . Essential hypertension 07/23/2014  . Subdural hematoma ([place]) 07/07/2014  . Arthritis of shoulder region, right 05/16/2014   PCP:  Hulan Fess, MD Pharmacy:   [id number] - [place], [place] [place] [address] Phone: [phone] Fax: [phone]     Social Determinants of Health (SDOH) Interventions    Readmission Risk Interventions Readmission Risk Prevention Plan 05/21/2019  Transportation Screening Complete  PCP or Specialist Appt within 3-5 Days Complete  HRI or [place] Complete  Social Work Consult for [place] Planning/Counseling Complete  Palliative Care Screening Not Applicable  Medication Review Press photographer) Complete  Some recent data might be hidden

## 2020-01-19 NOTE — Telephone Encounter (Signed)
HFU with Hailey Cobb 01/24/20 at 11 am

## 2020-01-19 NOTE — Procedures (Signed)
Thoracentesis Procedure Note  Pre-operative Diagnosis: pleural effusion   Post-operative Diagnosis: same  Indications: evaluation of pleural fluid and treatment of dyspnea   Procedure Details  Consent: Informed consent was obtained. Risks of the procedure were discussed including: infection, bleeding, pain, pneumothorax.  Under sterile conditions the patient was positioned. Betadine solution and sterile drapes were utilized.  1% buffered lidocaine was used to anesthetize the  rib space. Fluid was obtained without any difficulties and minimal blood loss.  A dressing was applied to the wound and wound care instructions were provided.   Findings 1200 ml of cloudy pleural fluid was obtained. A sample was sent to Pathology for cytogenetics, flow, and cell counts, as well as for infection analysis.  Complications:  None; patient tolerated the procedure well.          Condition: stable  Plan A follow up chest x-ray was ordered. Bed Rest for 0 hours. Tylenol 650 mg. for pain.  Erick Colace ACNP-BC [place] Pager # [phone] OR # [phone] if no answer

## 2020-01-19 NOTE — Progress Notes (Signed)
SATURATION QUALIFICATIONS: (This note is used to comply with regulatory documentation for home oxygen)  Patient Saturations on Room Air at Rest = 97%  Patient Saturations on Room Air while Ambulating = 87%  Patient Saturations on 2 Liters of oxygen while Ambulating = 92%

## 2020-01-20 ENCOUNTER — Telehealth: Payer: Self-pay | Admitting: Emergency Medicine

## 2020-01-20 NOTE — Telephone Encounter (Signed)
Follow effusion cytology. Needs fob if negative for malignancy

## 2020-01-21 LAB — BODY FLUID CULTURE
Culture: NO GROWTH
Special Requests: NORMAL

## 2020-01-22 ENCOUNTER — Telehealth: Payer: Self-pay | Admitting: Emergency Medicine

## 2020-01-22 DIAGNOSIS — J984 Other disorders of lung: Secondary | ICD-10-CM

## 2020-01-22 LAB — CHOLESTEROL, BODY FLUID: Cholesterol, Fluid: 69 mg/dL

## 2020-01-22 NOTE — Telephone Encounter (Signed)
   Spoke with pt's daughter, she states the hospital released the pt on Saturday. She was told that the results from the fluid would be back today to see what the next steps are. RB please advise.  Plan/Recommendations: Hailey Cobb is a very pleasant 86 y.o. woman with history of recent vulvar cancer s/p radiation who presents with new LLL lung mass.  Will proceed with PET scan. If no lymph node involvement will consider CT guided biopsy. Otherwise probably needs EBUS +/- Nav bronchosocopy. Will discuss with colleagues regarding appropriateness of either procedure pending results.  We discussed disease management and progression at length today, as well as next steps.   I spent 65 minutes in the care of this patient today including pre-charting, chart review, review of results, face-to-face care, coordination of care and communication with consultants etc.).

## 2020-01-23 ENCOUNTER — Telehealth: Payer: Self-pay | Admitting: Emergency Medicine

## 2020-01-23 LAB — CYTOLOGY - NON PAP

## 2020-01-23 NOTE — Telephone Encounter (Signed)
Called pt's daughter and advised message from the provider. Pt understood and verbalized understanding.   She states she is so weak and really sick but she does have an appt with Aaron Edelman tomorrow. She would like to get the bronch done as soon as possible.

## 2020-01-23 NOTE — Telephone Encounter (Signed)
Spoke with daughter [name], requesting cytology results and RB's recs on how to proceed, discharge summary mentions possible FOB.    RB please advise.  Thanks!

## 2020-01-23 NOTE — Telephone Encounter (Signed)
Please let them know that there were atypical cells present in the fluid, but there was no definite evidence for malignancy. Based on this, I believe we need to try to reschedule her bronchoscopy. I will work on this through our office and we will contact her.

## 2020-01-23 NOTE — Telephone Encounter (Signed)
Labs will done on specimen collected during EBUS.

## 2020-01-23 NOTE — Telephone Encounter (Signed)
DONE

## 2020-01-23 NOTE — Telephone Encounter (Signed)
Planning to reschedule FOB + EBUS on 6/22.

## 2020-01-23 NOTE — Telephone Encounter (Signed)
[name] daughter is checking on Bronch procedure. [name] phone number is [phone].

## 2020-01-24 ENCOUNTER — Ambulatory Visit (INDEPENDENT_AMBULATORY_CARE_PROVIDER_SITE_OTHER): Payer: Medicare Other

## 2020-01-24 ENCOUNTER — Other Ambulatory Visit: Payer: Self-pay | Admitting: Pulmonary Disease

## 2020-01-24 ENCOUNTER — Encounter: Payer: Self-pay | Admitting: Pulmonary Disease

## 2020-01-24 ENCOUNTER — Telehealth: Payer: Self-pay | Admitting: Pulmonary Disease

## 2020-01-24 ENCOUNTER — Ambulatory Visit (INDEPENDENT_AMBULATORY_CARE_PROVIDER_SITE_OTHER): Payer: Medicare Other | Admitting: Pulmonary Disease

## 2020-01-24 ENCOUNTER — Other Ambulatory Visit: Payer: Self-pay

## 2020-01-24 ENCOUNTER — Other Ambulatory Visit (HOSPITAL_COMMUNITY)
Admission: RE | Admit: 2020-01-24 | Discharge: 2020-01-24 | Disposition: A | Discharge status: Home / Self Care | Payer: Medicare Other | Source: Ambulatory Visit | Attending: Pulmonary Disease | Admitting: Pulmonary Disease

## 2020-01-24 VITALS — BP 112/60 | HR 68 | Temp 97.3°F | Ht 64.0 in | Wt 184.8 lb

## 2020-01-24 DIAGNOSIS — C519 Malignant neoplasm of vulva, unspecified: Secondary | ICD-10-CM

## 2020-01-24 DIAGNOSIS — J90 Pleural effusion, not elsewhere classified: Secondary | ICD-10-CM

## 2020-01-24 DIAGNOSIS — R9389 Abnormal findings on diagnostic imaging of other specified body structures: Secondary | ICD-10-CM | POA: Insufficient documentation

## 2020-01-24 DIAGNOSIS — J984 Other disorders of lung: Secondary | ICD-10-CM | POA: Diagnosis not present

## 2020-01-24 DIAGNOSIS — J948 Other specified pleural conditions: Secondary | ICD-10-CM | POA: Diagnosis not present

## 2020-01-24 DIAGNOSIS — R0602 Shortness of breath: Secondary | ICD-10-CM | POA: Diagnosis not present

## 2020-01-24 MED ORDER — HYDROCODONE-HOMATROPINE 5-1.5 MG/5ML PO SYRP: 5.0000 mL | ORAL_SOLUTION | Freq: Four times a day (QID) | ORAL | 0 refills | Status: DC | PRN

## 2020-01-24 NOTE — Progress Notes (Addendum)
Thoracentesis Procedure Note:   INDICATION: Left pleural effusion  PROCEDURE OPERATOR: Garner Nash, DO  ATTENDING PHYSICIAN: Garner Nash, DO    CONSENT:  Consent was obtained from patient prior to the procedure. Indications, risks, and benefits were explained at length.    PROCEDURE SUMMARY:  A time out was performed and the chest x-ray was reviewed, the appropriate side was confirmed and marked. Hands were washed immediately prior to the procedure. Sterile technique was used. The patient was prepped and draped in a sterile manner using chlorhexidine scrub after the appropriate level was identified under ultrasound and confirmed by ultrasound. 1% lidocaine was used to anesthesize the skin, subcutaneous tissue, superior aspect of the rib periosteum and parietal pleura. A finder needle was then introduced over the superior aspect of the rib to locate the pleural fluid; amber colored fluid was aspirated. A 10-blade scalpel was used to nick the skin at the insertion site. The needle was then introduced through the skin incision into the pleural space using negative aspiration pressure and the red colometric indicator to confirm appropriate positioning of the needle. The thoracentesis catheter was then threaded without difficulty. 1400 ml of amber colored fluid was removed without difficulty. The catheter was then removed. No immediate complications were noted during the procedure. A post-procedure chest x-ray is pending at the time of this note. The fluid will be sent for cytology.  Estimated blood loss is <1cc.  Ultrasound images were reviewed and stored in office based butterfly ultrasound cloud based imaging.   Garner Nash, DO [facility] 01/24/2020 1:35 PM

## 2020-01-24 NOTE — Assessment & Plan Note (Signed)
Stat chest x-ray today reveals recurrent left pleural effusion Worsened orthopnea/shortness of breath for last 24 to 48 hours Status post left thoracentesis on 01/18/2020  Discussion: Discussed case with Dr. Lamonte Sakai, Dr. Tamala Julian as well as Dr. Valeta Harms.  Plan will be for Dr. Valeta Harms to perform clinic outpatient thoracentesis.  We will plan for outpatient Pleurx placement next week.  Plan: Clinic thoracentesis by Dr. Valeta Harms today

## 2020-01-24 NOTE — Telephone Encounter (Signed)
cont'd : Asked to be worked in earlier as per request of BPM. Please call daughter of pt, [name] at [phone]

## 2020-01-24 NOTE — H&P (View-Only) (Signed)
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 (Oral)   Ht 5\' 4"  (1.626 m)   Wt 184 lb 12.8 oz (83.8 kg)   LMP  (LMP Unknown)   SpO2 91%   BMI 31.72 kg/m   Wt Readings from Last 5 Encounters:  01/24/20 184 lb 12.8 oz (83.8 kg)  01/18/20 181 lb (82.1 kg)  01/03/20 181 lb 6.4 oz (82.3 kg)  12/18/19 183 lb 3.2 oz (83.1 kg)  11/20/19 182 lb 8 oz (82.8 kg)    BMI Readings from Last 5 Encounters:  01/24/20 31.72 kg/m  01/18/20 31.07 kg/m  01/03/20 31.14 kg/m  12/18/19 31.45 kg/m  11/20/19 31.33 kg/m     Physical Exam Vitals and nursing note reviewed.  Constitutional:      General: She is not in acute distress.    Appearance: Normal appearance. She is obese.  HENT:     Head: Normocephalic and atraumatic.     Right Ear: Tympanic membrane, ear canal and external ear normal. There is no impacted cerumen.     Left Ear: Tympanic membrane, ear canal and external ear normal. There is no impacted cerumen.     Nose: Nose normal. No congestion.     Mouth/Throat:     Mouth: Mucous membranes are moist.     Pharynx: Oropharynx is clear.  Eyes:     Pupils: Pupils are equal, round, and reactive to light.  Cardiovascular:     Rate and Rhythm: Normal rate and regular rhythm.     Pulses: Normal pulses.     Heart sounds:  Normal heart sounds. No murmur.  Pulmonary:     Effort: Pulmonary effort is normal. No respiratory distress.     Breath sounds: No decreased air movement. Examination of the left-upper field reveals decreased breath sounds. Examination of the left-middle field reveals decreased breath sounds. Examination of the left-lower field reveals decreased breath sounds. Decreased breath sounds present. No wheezing or rales.  Abdominal:     General: Abdomen is flat. Bowel sounds are normal. There is distension.     Palpations: Abdomen is soft.  Musculoskeletal:     Cervical back: Normal range of motion.  Skin:  General: Skin is warm and dry.     Capillary Refill: Capillary refill takes less than 2 seconds.  Neurological:     General: No focal deficit present.     Mental Status: She is alert and oriented to person, place, and time. Mental status is at baseline.     Gait: Gait normal.  Psychiatric:        Mood and Affect: Mood normal.        Behavior: Behavior normal.        Thought Content: Thought content normal.        Judgment: Judgment normal.       Assessment & Plan:   Discussion: Chest x-ray in office shows recurrent left pleural effusion.  Discussed case with Dr. Lamonte Sakai.  Dr. Lamonte Sakai and I both agree patient would benefit from Pleurx catheter placement.  Due to patient's acute symptoms as well as acutely worsened chest x-ray we will plan for outpatient thoracentesis in clinic by Dr. Valeta Harms.  We will also tentatively plan for outpatient Pleurx placement next week by Dr. Valeta Harms.  Potentially based off of the exam and evaluation read by Dr. Valeta Harms if he has the capacity in order to get the patient's bronchoscopy moved up if still needed that Dr. Valeta Harms can take over the bronchoscopy in place of Dr. Lamonte Sakai.  Pleural effusion, left Stat chest x-ray today reveals recurrent left pleural effusion Worsened orthopnea/shortness of breath for last 24 to 48 hours Status post left thoracentesis on  01/18/2020  Discussion: Discussed case with Dr. Lamonte Sakai, Dr. Tamala Julian as well as Dr. Valeta Harms.  Plan will be for Dr. Valeta Harms to perform clinic outpatient thoracentesis.  We will plan for outpatient Pleurx placement next week.  Plan: Clinic thoracentesis by Dr. Valeta Harms today    Cavitating mass in left lower lung lobe Tentatively plan for bronchoscopy with Dr. Lamonte Sakai on 02/06/2020 Worsened left pleural effusion on exam today Ongoing chronic cough PET positive left lower lobe cavitary lung mass on 01/09/2020 PET scan  Plan: Outpatient thoracentesis today in clinic May be able to move up bronchoscopy from scheduled 02/06/2020 date planned with Dr. Lamonte Sakai    Return in about 2 weeks (around 02/07/2020), or if symptoms worsen or fail to improve, for Follow up with Dr. Valeta Harms, Follow up with Dr. Lamonte Sakai.   Lauraine Rinne, NP 01/24/2020   This appointment required 75 minutes of patient care (this includes precharting, chart review, review of results, face-to-face care, etc.).

## 2020-01-24 NOTE — Addendum Note (Signed)
Addended byCoralie Keens on: 01/24/2020 01:50 PM   Modules accepted: Orders

## 2020-01-24 NOTE — Addendum Note (Signed)
Addended by: Garner Nash on: 01/24/2020 01:47 PM   Modules accepted: Orders

## 2020-01-24 NOTE — Telephone Encounter (Signed)
I am seeing the patient next Tuesday for her bronchoscopy  I will address then Thanks Garner Nash, [facility] 01/24/2020 5:55 PM

## 2020-01-24 NOTE — Patient Instructions (Addendum)
You were seen today by Lauraine Rinne, NP  for:   1. Pleural effusion, left  - DG Chest 2 View; Future  We will coordinate outpatient thoracentesis in clinic today on 01/24/2020 with Dr. Valeta Harms  Tentative plan will be to place Pleurx next week in hospital outpatient setting   2. Cavitating mass in left lower lung lobe  Continue forward with tentative plan of obtaining bronchoscopy with either Dr. Lamonte Sakai as scheduled or with Dr. Valeta Harms after thoracentesis/evaluation today    We recommend today:  Orders Placed This Encounter  Procedures  . DG Chest 2 View    Standing Status:   Future    Number of Occurrences:   1    Standing Expiration Date:   05/25/2020    Order Specific Question:   Reason for Exam (SYMPTOM  OR DIAGNOSIS REQUIRED)    Answer:   left pleural effusion, sob    Order Specific Question:   Preferred imaging location?    Answer:   Internal    Order Specific Question:   Radiology Contrast Protocol - do NOT remove file path    Answer:   [email or web address]   Orders Placed This Encounter  Procedures  . DG Chest 2 View   No orders of the defined types were placed in this encounter.   Follow Up:    Return in about 2 weeks (around 02/07/2020), or if symptoms worsen or fail to improve, for Follow up with Dr. Valeta Harms, Follow up with Dr. Lamonte Sakai.   Please do your part to reduce the spread of COVID-19:      Reduce your risk of any infection  and COVID19 by using the similar precautions used for avoiding the common cold or flu:  Marland Kitchen Wash your hands often with soap and warm water for at least 20 seconds.  If soap and water are not readily available, use an alcohol-based hand sanitizer with at least 60% alcohol.  . If coughing or sneezing, cover your mouth and nose by coughing or sneezing into the elbow areas of your shirt or coat, into a tissue or into your sleeve (not your hands). Langley Gauss A MASK when in public  . Avoid shaking hands with others  and consider head nods or verbal greetings only. . Avoid touching your eyes, nose, or mouth with unwashed hands.  . Avoid close contact with people who are sick. . Avoid places or events with large numbers of people in one location, like concerts or sporting events. . If you have some symptoms but not all symptoms, continue to monitor at home and seek medical attention if your symptoms worsen. . If you are having a medical emergency, call 911.   [place] / e-Visit: [email or web address]         [facility]: [facility]: [phone]                   [facility]: [phone]     It is flu season:   >>> Best ways to protect herself from the flu: Receive the yearly flu vaccine, practice good hand hygiene washing with soap and also using hand sanitizer when available, eat a nutritious meals, get adequate rest, hydrate appropriately   Please contact the office if your symptoms worsen or you have concerns that you are not improving.   Thank you for choosing [facility] for your healthcare, and for allowing [place] to partner  with you on your healthcare journey. I am thankful to be able to provide care to you today.   Wyn Quaker FNP-C

## 2020-01-24 NOTE — Progress Notes (Signed)
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 4"  (1.626 m)   Wt 184 lb 12.8 oz (83.8 kg)   LMP  (LMP Unknown)   SpO2 91%   BMI 31.72 kg/m   Wt Readings from Last 5 Encounters:  01/24/20 184 lb 12.8 oz (83.8 kg)  01/18/20 181 lb (82.1 kg)  01/03/20 181 lb 6.4 oz (82.3 kg)  12/18/19 183 lb 3.2 oz (83.1 kg)  11/20/19 182 lb 8 oz (82.8 kg)    BMI Readings from Last 5 Encounters:  01/24/20 31.72 kg/m  01/18/20 31.07 kg/m  01/03/20 31.14 kg/m  12/18/19 31.45 kg/m  11/20/19 31.33 kg/m     Physical Exam Vitals and nursing note reviewed.  Constitutional:      General: She is not in acute distress.    Appearance: Normal appearance. She is obese.  HENT:     Head: Normocephalic and atraumatic.     Right Ear: Tympanic membrane, ear canal and external ear normal. There is no impacted cerumen.     Left Ear: Tympanic membrane, ear canal and external ear normal. There is no impacted cerumen.     Nose: Nose normal. No congestion.     Mouth/Throat:     Mouth: Mucous membranes are moist.     Pharynx: Oropharynx is clear.  Eyes:     Pupils: Pupils are equal, round, and reactive to light.  Cardiovascular:     Rate and Rhythm: Normal rate and regular rhythm.     Pulses: Normal pulses.     Heart sounds:  Normal heart sounds. No murmur.  Pulmonary:     Effort: Pulmonary effort is normal. No respiratory distress.     Breath sounds: No decreased air movement. Examination of the left-upper field reveals decreased breath sounds. Examination of the left-middle field reveals decreased breath sounds. Examination of the left-lower field reveals decreased breath sounds. Decreased breath sounds present. No wheezing or rales.  Abdominal:     General: Abdomen is flat. Bowel sounds are normal. There is distension.     Palpations: Abdomen is soft.  Musculoskeletal:     Cervical back: Normal range of motion.  Skin:  General: Skin is warm and dry.     Capillary Refill: Capillary refill takes less than 2 seconds.  Neurological:     General: No focal deficit present.     Mental Status: She is alert and oriented to person, place, and time. Mental status is at baseline.     Gait: Gait normal.  Psychiatric:        Mood and Affect: Mood normal.        Behavior: Behavior normal.        Thought Content: Thought content normal.        Judgment: Judgment normal.       Assessment & Plan:   Discussion: Chest x-ray in office shows recurrent left pleural effusion.  Discussed case with Dr. Lamonte Sakai.  Dr. Lamonte Sakai and I both agree patient would benefit from Pleurx catheter placement.  Due to patient's acute symptoms as well as acutely worsened chest x-ray we will plan for outpatient thoracentesis in clinic by Dr. Valeta Harms.  We will also tentatively plan for outpatient Pleurx placement next week by Dr. Valeta Harms.  Potentially based off of the exam and evaluation read by Dr. Valeta Harms if he has the capacity in order to get the patient's bronchoscopy moved up if still needed that Dr. Valeta Harms can take over the bronchoscopy in place of Dr. Lamonte Sakai.  Pleural effusion, left Stat chest x-ray today reveals recurrent left pleural effusion Worsened orthopnea/shortness of breath for last 24 to 48 hours Status post left thoracentesis on  01/18/2020  Discussion: Discussed case with Dr. Lamonte Sakai, Dr. Tamala Julian as well as Dr. Valeta Harms.  Plan will be for Dr. Valeta Harms to perform clinic outpatient thoracentesis.  We will plan for outpatient Pleurx placement next week.  Plan: Clinic thoracentesis by Dr. Valeta Harms today    Cavitating mass in left lower lung lobe Tentatively plan for bronchoscopy with Dr. Lamonte Sakai on 02/06/2020 Worsened left pleural effusion on exam today Ongoing chronic cough PET positive left lower lobe cavitary lung mass on 01/09/2020 PET scan  Plan: Outpatient thoracentesis today in clinic May be able to move up bronchoscopy from scheduled 02/06/2020 date planned with Dr. Lamonte Sakai    Return in about 2 weeks (around 02/07/2020), or if symptoms worsen or fail to improve, for Follow up with Dr. Valeta Harms, Follow up with Dr. Lamonte Sakai.   Lauraine Rinne, NP 01/24/2020   This appointment required 75 minutes of patient care (this includes precharting, chart review, review of results, face-to-face care, etc.).

## 2020-01-24 NOTE — Telephone Encounter (Signed)
Spoke with [name], she stated she was seen in the office today. Nothing further is needed.

## 2020-01-24 NOTE — Addendum Note (Signed)
Addended by: Garner Nash on: 01/24/2020 01:44 PM   Modules accepted: Orders

## 2020-01-24 NOTE — Assessment & Plan Note (Signed)
Tentatively plan for bronchoscopy with Dr. Lamonte Sakai on 02/06/2020 Worsened left pleural effusion on exam today Ongoing chronic cough PET positive left lower lobe cavitary lung mass on 01/09/2020 PET scan  Plan: Outpatient thoracentesis today in clinic May be able to move up bronchoscopy from scheduled 02/06/2020 date planned with Dr. Lamonte Sakai

## 2020-01-24 NOTE — Telephone Encounter (Signed)
Hailey Cobb seen pt today and wanted her back with [facility] or [facility] in 2 wks  Nothing until after 4 wks  Pt made appt for 02/26/20 with [facility], but is upset that she has to wait and wants to be seen in 2 wks as requested   Will route to Dr Lamonte Sakai and Dr Valeta Harms to see if they wish to overbook in order to see the pt sooner

## 2020-01-25 LAB — CYTOLOGY - NON PAP

## 2020-01-26 ENCOUNTER — Telehealth: Payer: Self-pay | Admitting: Emergency Medicine

## 2020-01-26 NOTE — Telephone Encounter (Signed)
Spoke with the pt's daughter Hailey Cobb I scheduled ov with TP for POC qualification  She is now asking how dangerous is the procedure that the pt is going to have done is with Dr Valeta Harms given her age and current state  Please advise, thanks

## 2020-01-26 NOTE — Telephone Encounter (Signed)
Ok to start process for POC >> she would need an OV to assess for pulsed flow O2 or would need her DME to perform. I don't know if [employer] has any to provide, but OK to order to see if they do.

## 2020-01-26 NOTE — Telephone Encounter (Signed)
Spoke with [name]  She states Dr Lamonte Sakai prescribed her o2 while in the hospital recently  She is asking if we can prescribe POC  Please advise, thanks

## 2020-01-27 ENCOUNTER — Other Ambulatory Visit (HOSPITAL_COMMUNITY)
Admission: RE | Admit: 2020-01-27 | Discharge: 2020-01-27 | Disposition: A | Discharge status: Home / Self Care | Payer: Medicare Other | Source: Ambulatory Visit | Attending: Pulmonary Disease | Admitting: Pulmonary Disease

## 2020-01-27 DIAGNOSIS — Z01812 Encounter for preprocedural laboratory examination: Secondary | ICD-10-CM | POA: Insufficient documentation

## 2020-01-27 DIAGNOSIS — Z20822 Contact with and (suspected) exposure to covid-19: Secondary | ICD-10-CM | POA: Insufficient documentation

## 2020-01-27 LAB — SARS CORONAVIRUS 2 (TAT 6-24 HRS): SARS Coronavirus 2: NEGATIVE

## 2020-01-29 ENCOUNTER — Encounter (HOSPITAL_COMMUNITY): Payer: Self-pay | Admitting: Pulmonary Disease

## 2020-01-29 ENCOUNTER — Other Ambulatory Visit: Payer: Self-pay

## 2020-01-29 NOTE — Progress Notes (Signed)
SDW-pre-op call completed by both pt and daughter, [name] [facility]). Pt denies any acute pulmonary issues. Pt wears supplemental oxygen PRN.  Pt denies chest pain. Pt stated that she is under the care of Dr. Tamala Julian, Cardiology and Dr. Hulan Fess, PCP. Pt stated that a stress test was performed > 10 years ago. Pt stated that care must be taken when inserting anything into her throat because she has a vocal cord implant after having her thyroid removed. Daughter made aware to have pt stop taking vitamins, fish oil and herbal medications. Do not take any NSAIDs ie: Ibuprofen, Advil, Naproxen (Aleve), Motrin, BC and Goody Powder. Daughter made aware to have pt hold Metformin and Januvia DOS. Daughter made aware to have pt check CBG every 2 hours prior to arrival to hospital on DOS. Pt made aware to treat a CBG < 70 with 4 ounces of apple or cranberry juice, wait 15 minutes after intervention to recheck CBG, if CBG remains < 70, call Short Stay unit to speak with a nurse. Daughter reminded to have pt continue to quarantine. Daughter verbalized understanding of all pre-op instructions.

## 2020-01-29 NOTE — Progress Notes (Signed)
Anesthesia Chart Review: Same-day work-up  Patient was originally scheduled for this procedure on 01/18/2020 however on presentation she was in acute hypoxemic respiratory failure, the procedure was canceled and she was admitted for treatment.  Her respiratory failure was felt due to her left lower lobe volume loss and evolving pleural effusion.  Thoracentesis was performed which yielded 1.2 L of exudative fluid.  After removal of fluid her work of breathing significantly improved.  She was noted to have some desaturations with activity and was discharged the next day on 2 L supplemental O2 with exertion.  She was seen by pulmonology on 01/24/2020 and outpatient follow-up.  Patient reported increased shortness of breath and cough.  Repeat chest x-ray showed increasing left-sided pleural effusion.  Outpatient thoracentesis was completed yielding 1.4 L of amber-colored fluid.  Follows with cardiology for history of tachycardia/bradycardia syndrome, right bundle branch block, severe AS status post TAVR 05/07/2019.  Last seen 10/10/2019 by Dr. Tamala Julian.  Per note, she had stable valve function and has not required pacemaker therapy.  Appeared euvolemic on exam.  93-month follow-up recommended.  History of seizure after fall resulting in SDH in 2015, managed nonsurgically.  She has been maintained on Keppra.  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  General anesthesia with ETT 05/16/2014 -no difficulty noted.  DM2 last A1c 6.5 on 01/18/2020.  Will need day of surgery labs and eval.  EKG 09/18/2019: NSR.  Rate 68.  RBBB.  TTE 06/08/2019: 1. Left ventricular ejection fraction, by visual estimation, is 60 to  65%. The left ventricle has normal function. Normal left ventricular size.  There is mildly increased left ventricular hypertrophy.  2. Elevated mean left atrial pressure.  3.  Left ventricular diastolic Doppler parameters are consistent with  impaired relaxation pattern of LV diastolic filling.  4. Global right ventricle has normal systolic function.The right  ventricular size is normal. No increase in right ventricular wall  thickness.  5. Left atrial size was severely dilated.  6. Right atrial size was normal.  7. The mitral valve is normal in structure. Mild mitral valve  regurgitation. No evidence of mitral stenosis.  8. The tricuspid valve is normal in structure. Tricuspid valve  regurgitation is mild.  9. Aortic valve regurgitation was not visualized by color flow Doppler.  Structurally normal aortic valve, with no evidence of sclerosis or  stenosis.  10. Peak transaortic velocity: 2.35m/s, mean gradient 81mmHg.  11. The pulmonic valve was normal in structure. Pulmonic valve  regurgitation is trivial by color flow Doppler.  12. Mildly elevated pulmonary artery systolic pressure.  13. The tricuspid regurgitant velocity is 2.39 m/s, and with an assumed  right atrial pressure of 10 mmHg, the estimated right ventricular systolic  pressure is mildly elevated at 32.9 mmHg.  14. The inferior vena cava is normal in size with greater than 50%  respiratory variability, suggesting right atrial pressure of 3 mmHg.   Right/left heart cath (pre-TAVR) 04/06/2019:  Critical calcific aortic stenosis with calculated aortic valve area of 0.77 cm.  Peak transvalvular gradient 80 mmHg and mean gradient 66 mmHg  Normal left main  LAD contains proximal eccentric 50% stenosis.  Mid LAD contains 30% narrowing.  Mid circumflex contains 30% diffuse narrowing overlapping the origin of the second marginal.  Minimal luminal irregularities are noted in the dominant right coronary.  Minimal/mild pulmonary hypertension.  Normal left ventricular end-diastolic pressure.  RECOMMENDATIONS:  Severe symptomatic aortic stenosis.  Referred to the structural heart team  for consideration of TAVR.  The patient is currently undergoing curative radiation therapy for vulvar cancer.  If she is a candidate for TAVR hope to have this completed sometime within the next 3 months after she has completed radiation therapy.   Wynonia Musty [facility] Short Stay Center/Anesthesiology Phone [phone] 01/29/2020 1:14 PM

## 2020-01-29 NOTE — Telephone Encounter (Signed)
RB, please advise. Thanks!  

## 2020-01-29 NOTE — Anesthesia Preprocedure Evaluation (Addendum)
Anesthesia Evaluation  Patient identified by MRN, date of birth, ID band Patient awake    Reviewed: Allergy & Precautions, NPO status , Patient's Chart, lab work & pertinent test results  History of Anesthesia Complications Negative for: history of anesthetic complications  Airway Mallampati: III  TM Distance: >3 FB Neck ROM: Full    Dental  (+) Missing   Pulmonary shortness of breath,    Pulmonary exam normal        Cardiovascular hypertension, Normal cardiovascular exam+ dysrhythmias + Valvular Problems/Murmurs (s/p TAVR 2020)      Neuro/Psych Seizures -,  negative psych ROS   GI/Hepatic Neg liver ROS, GERD  ,  Endo/Other  diabetes, Type 2Hypothyroidism   Renal/GU negative Renal ROS  negative genitourinary   Musculoskeletal negative musculoskeletal ROS (+)   Abdominal   Peds  Hematology negative hematology ROS (+)   Anesthesia Other Findings  Case cancelled 01/18/20 due to acute hypoxemia with O2 sat 75% on RA- admitted and treated with thoracentesis which was repeated as an outpatient.   Left VC paralysis after thyroidectomy, s/p medialization laryngoplasty (2011)  Follows with cardiology for history of tachycardia/bradycardia syndrome, right bundle branch block, severe AS status post TAVR 05/07/2019.  Last seen 10/10/2019 by Dr. Tamala Julian.  Per note, she had stable valve function and has not required pacemaker therapy.  Appeared euvolemic on exam.  71-month follow-up recommended.  Reproductive/Obstetrics                            Anesthesia Physical Anesthesia Plan  ASA: IV  Anesthesia Plan: General   Post-op Pain Management:    Induction: Intravenous  PONV Risk Score and Plan: 3 and Ondansetron, Dexamethasone, Treatment may vary due to age or medical condition and Midazolam  Airway Management Planned: Oral ETT  Additional Equipment: None  Intra-op Plan:   Post-operative Plan:  Extubation in OR  Informed Consent: I have reviewed the patients History and Physical, chart, labs and discussed the procedure including the risks, benefits and alternatives for the proposed anesthesia with the patient or authorized representative who has indicated his/her understanding and acceptance.     Dental advisory given  Plan Discussed with:   Anesthesia Plan Comments: (PAT note by Karoline Caldwell, PA-C: Patient was originally scheduled for this procedure on 01/18/2020 however on presentation she was in acute hypoxemic respiratory failure, the procedure was canceled and she was admitted for treatment.  Her respiratory failure was felt due to her left lower lobe volume loss and evolving pleural effusion.  Thoracentesis was performed which yielded 1.2 L of exudative fluid.  After removal of fluid her work of breathing significantly improved.  She was noted to have some desaturations with activity and was discharged the next day on 2 L supplemental O2 with exertion.  She was seen by pulmonology on 01/24/2020 and outpatient follow-up.  Patient reported increased shortness of breath and cough.  Repeat chest x-ray showed increasing left-sided pleural effusion.  Outpatient thoracentesis was completed yielding 1.4 L of amber-colored fluid.  Follows with cardiology for history of tachycardia/bradycardia syndrome, right bundle branch block, severe AS status post TAVR 05/07/2019.  Last seen 10/10/2019 by Dr. Tamala Julian.  Per note, she had stable valve function and has not required pacemaker therapy.  Appeared euvolemic on exam.  92-month follow-up recommended.  History of seizure after fall resulting in SDH in 2015, managed nonsurgically.  She has been maintained on Keppra.  History of thyroidectomy in 2010 with  secondary hypothyroidism, left vocal cord paralysis following thyroidectomy s/p left medialization laryngoplasty with Gore-Tex 02/07/10.  General anesthesia with ETT 05/16/2014 -no difficulty noted.  DM2  last A1c 6.5 on 01/18/2020.  Will need day of surgery labs and eval.  EKG 09/18/2019: NSR.  Rate 68.  RBBB.  TTE 06/08/2019: 1. Left ventricular ejection fraction, by visual estimation, is 60 to  65%. The left ventricle has normal function. Normal left ventricular size.  There is mildly increased left ventricular hypertrophy.  2. Elevated mean left atrial pressure.  3. Left ventricular diastolic Doppler parameters are consistent with  impaired relaxation pattern of LV diastolic filling.  4. Global right ventricle has normal systolic function.The right  ventricular size is normal. No increase in right ventricular wall  thickness.  5. Left atrial size was severely dilated.  6. Right atrial size was normal.  7. The mitral valve is normal in structure. Mild mitral valve  regurgitation. No evidence of mitral stenosis.  8. The tricuspid valve is normal in structure. Tricuspid valve  regurgitation is mild.  9. Aortic valve regurgitation was not visualized by color flow Doppler.  Structurally normal aortic valve, with no evidence of sclerosis or  stenosis.  10. Peak transaortic velocity: 2.55m/s, mean gradient 67mmHg.  11. The pulmonic valve was normal in structure. Pulmonic valve  regurgitation is trivial by color flow Doppler.  12. Mildly elevated pulmonary artery systolic pressure.  13. The tricuspid regurgitant velocity is 2.39 m/s, and with an assumed  right atrial pressure of 10 mmHg, the estimated right ventricular systolic  pressure is mildly elevated at 32.9 mmHg.  14. The inferior vena cava is normal in size with greater than 50%  respiratory variability, suggesting right atrial pressure of 3 mmHg.   Right/left heart cath (pre-TAVR) 04/06/2019:      Critical calcific aortic stenosis with calculated aortic valve area of 0.77 cm.  Peak transvalvular gradient 80 mmHg and mean gradient 66 mmHg      Normal left main      LAD contains proximal eccentric 50% stenosis.  Mid  LAD contains 30% narrowing.      Mid circumflex contains 30% diffuse narrowing overlapping the origin of the second marginal.      Minimal luminal irregularities are noted in the dominant right coronary.      Minimal/mild pulmonary hypertension.      Normal left ventricular end-diastolic pressure.  RECOMMENDATIONS:       Severe symptomatic aortic stenosis.      Referred to the structural heart team for consideration of TAVR.      The patient is currently undergoing curative radiation therapy for vulvar cancer.      If she is a candidate for TAVR hope to have this completed sometime within the next 3 months after she has completed radiation therapy.  )       Anesthesia Quick Evaluation

## 2020-01-30 ENCOUNTER — Telehealth: Payer: Self-pay | Admitting: Pulmonary Disease

## 2020-01-30 ENCOUNTER — Ambulatory Visit (HOSPITAL_COMMUNITY): Payer: Medicare Other

## 2020-01-30 ENCOUNTER — Encounter (HOSPITAL_COMMUNITY): Payer: Self-pay | Admitting: Pulmonary Disease

## 2020-01-30 ENCOUNTER — Encounter (HOSPITAL_COMMUNITY)
Admission: RE | Disposition: A | Discharge status: Home Health | Payer: Self-pay | Source: Home / Self Care | Attending: Emergency Medicine

## 2020-01-30 ENCOUNTER — Other Ambulatory Visit: Payer: Self-pay

## 2020-01-30 ENCOUNTER — Ambulatory Visit (HOSPITAL_COMMUNITY): Payer: Medicare Other | Admitting: Physician Assistant

## 2020-01-30 ENCOUNTER — Observation Stay (HOSPITAL_COMMUNITY)
Admission: RE | Admit: 2020-01-30 | Discharge: 2020-01-31 | Disposition: A | Discharge status: Home Health | Payer: Medicare Other | Attending: Emergency Medicine | Admitting: Emergency Medicine

## 2020-01-30 DIAGNOSIS — F419 Anxiety disorder, unspecified: Secondary | ICD-10-CM | POA: Insufficient documentation

## 2020-01-30 DIAGNOSIS — G8918 Other acute postprocedural pain: Secondary | ICD-10-CM | POA: Insufficient documentation

## 2020-01-30 DIAGNOSIS — E7800 Pure hypercholesterolemia, unspecified: Secondary | ICD-10-CM | POA: Diagnosis not present

## 2020-01-30 DIAGNOSIS — I700 Atherosclerosis of aorta: Secondary | ICD-10-CM | POA: Insufficient documentation

## 2020-01-30 DIAGNOSIS — Z952 Presence of prosthetic heart valve: Secondary | ICD-10-CM | POA: Insufficient documentation

## 2020-01-30 DIAGNOSIS — Z7982 Long term (current) use of aspirin: Secondary | ICD-10-CM | POA: Insufficient documentation

## 2020-01-30 DIAGNOSIS — Z7984 Long term (current) use of oral hypoglycemic drugs: Secondary | ICD-10-CM | POA: Insufficient documentation

## 2020-01-30 DIAGNOSIS — Z938 Other artificial opening status: Secondary | ICD-10-CM

## 2020-01-30 DIAGNOSIS — Z96612 Presence of left artificial shoulder joint: Secondary | ICD-10-CM | POA: Insufficient documentation

## 2020-01-30 DIAGNOSIS — Z8674 Personal history of sudden cardiac arrest: Secondary | ICD-10-CM | POA: Insufficient documentation

## 2020-01-30 DIAGNOSIS — Z96611 Presence of right artificial shoulder joint: Secondary | ICD-10-CM | POA: Insufficient documentation

## 2020-01-30 DIAGNOSIS — J984 Other disorders of lung: Secondary | ICD-10-CM | POA: Diagnosis not present

## 2020-01-30 DIAGNOSIS — E875 Hyperkalemia: Secondary | ICD-10-CM | POA: Diagnosis not present

## 2020-01-30 DIAGNOSIS — M1990 Unspecified osteoarthritis, unspecified site: Secondary | ICD-10-CM | POA: Insufficient documentation

## 2020-01-30 DIAGNOSIS — J90 Pleural effusion, not elsewhere classified: Secondary | ICD-10-CM

## 2020-01-30 DIAGNOSIS — E119 Type 2 diabetes mellitus without complications: Secondary | ICD-10-CM | POA: Diagnosis not present

## 2020-01-30 DIAGNOSIS — I5032 Chronic diastolic (congestive) heart failure: Secondary | ICD-10-CM | POA: Insufficient documentation

## 2020-01-30 DIAGNOSIS — Y92239 Unspecified place in hospital as the place of occurrence of the external cause: Secondary | ICD-10-CM | POA: Insufficient documentation

## 2020-01-30 DIAGNOSIS — Z9889 Other specified postprocedural states: Secondary | ICD-10-CM | POA: Insufficient documentation

## 2020-01-30 DIAGNOSIS — I110 Hypertensive heart disease with heart failure: Secondary | ICD-10-CM | POA: Diagnosis not present

## 2020-01-30 DIAGNOSIS — I495 Sick sinus syndrome: Secondary | ICD-10-CM | POA: Insufficient documentation

## 2020-01-30 DIAGNOSIS — E890 Postprocedural hypothyroidism: Secondary | ICD-10-CM | POA: Insufficient documentation

## 2020-01-30 DIAGNOSIS — R569 Unspecified convulsions: Secondary | ICD-10-CM | POA: Insufficient documentation

## 2020-01-30 DIAGNOSIS — C3432 Malignant neoplasm of lower lobe, left bronchus or lung: Secondary | ICD-10-CM | POA: Diagnosis not present

## 2020-01-30 DIAGNOSIS — J9811 Atelectasis: Secondary | ICD-10-CM | POA: Insufficient documentation

## 2020-01-30 DIAGNOSIS — Y838 Other surgical procedures as the cause of abnormal reaction of the patient, or of later complication, without mention of misadventure at the time of the procedure: Secondary | ICD-10-CM | POA: Diagnosis not present

## 2020-01-30 DIAGNOSIS — I4510 Unspecified right bundle-branch block: Secondary | ICD-10-CM | POA: Insufficient documentation

## 2020-01-30 DIAGNOSIS — I97191 Other postprocedural cardiac functional disturbances following other surgery: Secondary | ICD-10-CM | POA: Diagnosis not present

## 2020-01-30 DIAGNOSIS — I4891 Unspecified atrial fibrillation: Secondary | ICD-10-CM | POA: Diagnosis present

## 2020-01-30 DIAGNOSIS — Z96652 Presence of left artificial knee joint: Secondary | ICD-10-CM | POA: Insufficient documentation

## 2020-01-30 DIAGNOSIS — Z79899 Other long term (current) drug therapy: Secondary | ICD-10-CM | POA: Insufficient documentation

## 2020-01-30 DIAGNOSIS — E785 Hyperlipidemia, unspecified: Secondary | ICD-10-CM | POA: Insufficient documentation

## 2020-01-30 DIAGNOSIS — I2510 Atherosclerotic heart disease of native coronary artery without angina pectoris: Secondary | ICD-10-CM | POA: Diagnosis not present

## 2020-01-30 DIAGNOSIS — Z888 Allergy status to other drugs, medicaments and biological substances status: Secondary | ICD-10-CM | POA: Insufficient documentation

## 2020-01-30 DIAGNOSIS — Z79890 Hormone replacement therapy (postmenopausal): Secondary | ICD-10-CM | POA: Diagnosis not present

## 2020-01-30 DIAGNOSIS — K754 Autoimmune hepatitis: Secondary | ICD-10-CM | POA: Insufficient documentation

## 2020-01-30 DIAGNOSIS — Z8679 Personal history of other diseases of the circulatory system: Secondary | ICD-10-CM | POA: Insufficient documentation

## 2020-01-30 DIAGNOSIS — Z9981 Dependence on supplemental oxygen: Secondary | ICD-10-CM | POA: Insufficient documentation

## 2020-01-30 HISTORY — DX: Cardiac arrest, cause unspecified: I46.9

## 2020-01-30 HISTORY — DX: Other nonspecific abnormal finding of lung field: R91.8

## 2020-01-30 HISTORY — DX: Failed or difficult intubation, initial encounter: T88.4XXA

## 2020-01-30 HISTORY — DX: Presence of spectacles and contact lenses: Z97.3

## 2020-01-30 HISTORY — DX: Dependence on supplemental oxygen: Z99.81

## 2020-01-30 HISTORY — DX: Personal history of urinary calculi: Z87.442

## 2020-01-30 HISTORY — DX: Unspecified macular degeneration: H35.30

## 2020-01-30 HISTORY — DX: Pleural effusion, not elsewhere classified: J90

## 2020-01-30 HISTORY — DX: Pneumonia, unspecified organism: J18.9

## 2020-01-30 HISTORY — DX: Malignant (primary) neoplasm, unspecified: C80.1

## 2020-01-30 HISTORY — DX: Presence of dental prosthetic device (complete) (partial): Z97.2

## 2020-01-30 LAB — CBC WITH DIFFERENTIAL/PLATELET
Abs Immature Granulocytes: 0.02 10*3/uL (ref 0.00–0.07)
Basophils Absolute: 0 10*3/uL (ref 0.0–0.1)
Basophils Relative: 0 %
Eosinophils Absolute: 0 10*3/uL (ref 0.0–0.5)
Eosinophils Relative: 0 %
HCT: 29.7 % — ABNORMAL LOW (ref 36.0–46.0)
Hemoglobin: 9.3 g/dL — ABNORMAL LOW (ref 12.0–15.0)
Immature Granulocytes: 0 %
Lymphocytes Relative: 2 %
Lymphs Abs: 0.1 10*3/uL — ABNORMAL LOW (ref 0.7–4.0)
MCH: 27.9 pg (ref 26.0–34.0)
MCHC: 31.3 g/dL (ref 30.0–36.0)
MCV: 89.2 fL (ref 80.0–100.0)
Monocytes Absolute: 0.1 10*3/uL (ref 0.1–1.0)
Monocytes Relative: 1 %
Neutro Abs: 6 10*3/uL (ref 1.7–7.7)
Neutrophils Relative %: 97 %
Platelets: 245 10*3/uL (ref 150–400)
RBC: 3.33 MIL/uL — ABNORMAL LOW (ref 3.87–5.11)
RDW: 14.9 % (ref 11.5–15.5)
WBC: 6.2 10*3/uL (ref 4.0–10.5)
nRBC: 0 % (ref 0.0–0.2)

## 2020-01-30 LAB — COMPREHENSIVE METABOLIC PANEL
ALT: 85 U/L — ABNORMAL HIGH (ref 0–44)
ALT: 77 U/L — ABNORMAL HIGH (ref 0–44)
AST: 77 U/L — ABNORMAL HIGH (ref 15–41)
AST: 63 U/L — ABNORMAL HIGH (ref 15–41)
Albumin: 2.3 g/dL — ABNORMAL LOW (ref 3.5–5.0)
Albumin: 2.5 g/dL — ABNORMAL LOW (ref 3.5–5.0)
Alkaline Phosphatase: 71 U/L (ref 38–126)
Alkaline Phosphatase: 70 U/L (ref 38–126)
Anion gap: 9 (ref 5–15)
Anion gap: 8 (ref 5–15)
BUN: 9 mg/dL (ref 8–23)
BUN: 10 mg/dL (ref 8–23)
CO2: 24 mmol/L (ref 22–32)
CO2: 26 mmol/L (ref 22–32)
Calcium: 8.9 mg/dL (ref 8.9–10.3)
Calcium: 8.9 mg/dL (ref 8.9–10.3)
Chloride: 99 mmol/L (ref 98–111)
Chloride: 98 mmol/L (ref 98–111)
Creatinine, Ser: 0.54 mg/dL (ref 0.44–1.00)
Creatinine, Ser: 0.57 mg/dL (ref 0.44–1.00)
GFR calc Af Amer: >60 mL/min (ref >60)
GFR calc Af Amer: >60 mL/min (ref >60)
GFR calc non Af Amer: >60 mL/min (ref >60)
GFR calc non Af Amer: >60 mL/min (ref >60)
Glucose, Bld: 139 mg/dL — ABNORMAL HIGH (ref 70–99)
Glucose, Bld: 167 mg/dL — ABNORMAL HIGH (ref 70–99)
Potassium: 4.5 mmol/L (ref 3.5–5.1)
Potassium: 4.7 mmol/L (ref 3.5–5.1)
Sodium: 132 mmol/L — ABNORMAL LOW (ref 135–145)
Sodium: 132 mmol/L — ABNORMAL LOW (ref 135–145)
Total Bilirubin: 0.2 mg/dL — ABNORMAL LOW (ref 0.3–1.2)
Total Bilirubin: 0.1 mg/dL — ABNORMAL LOW (ref 0.3–1.2)
Total Protein: 6.1 g/dL — ABNORMAL LOW (ref 6.5–8.1)
Total Protein: 6.3 g/dL — ABNORMAL LOW (ref 6.5–8.1)

## 2020-01-30 LAB — GLUCOSE, CAPILLARY
Glucose-Capillary: 147 mg/dL — ABNORMAL HIGH (ref 70–99)
Glucose-Capillary: 163 mg/dL — ABNORMAL HIGH (ref 70–99)
Glucose-Capillary: 155 mg/dL — ABNORMAL HIGH (ref 70–99)

## 2020-01-30 LAB — CBC
HCT: 29.8 % — ABNORMAL LOW (ref 36.0–46.0)
Hemoglobin: 9.3 g/dL — ABNORMAL LOW (ref 12.0–15.0)
MCH: 28.3 pg (ref 26.0–34.0)
MCHC: 31.2 g/dL (ref 30.0–36.0)
MCV: 90.6 fL (ref 80.0–100.0)
Platelets: 237 10*3/uL (ref 150–400)
RBC: 3.29 MIL/uL — ABNORMAL LOW (ref 3.87–5.11)
RDW: 15.1 % (ref 11.5–15.5)
WBC: 4.2 10*3/uL (ref 4.0–10.5)
nRBC: 0 % (ref 0.0–0.2)

## 2020-01-30 LAB — TSH: TSH: 0.975 u[IU]/mL (ref 0.350–4.500)

## 2020-01-30 LAB — PHOSPHORUS: Phosphorus: 3.6 mg/dL (ref 2.5–4.6)

## 2020-01-30 LAB — MAGNESIUM: Magnesium: 1.3 mg/dL — ABNORMAL LOW (ref 1.7–2.4)

## 2020-01-30 SURGERY — VIDEO BRONCHOSCOPY WITHOUT FLUORO
Anesthesia: General

## 2020-01-30 MED ORDER — SUGAMMADEX SODIUM 200 MG/2ML IV SOLN
INTRAVENOUS | Status: DC | PRN
  Administered 2020-01-30 (×2): 200 mg via INTRAVENOUS

## 2020-01-30 MED ORDER — ASPIRIN 81 MG PO CHEW
81.0000 mg | CHEWABLE_TABLET | Freq: Every day | ORAL | Status: DC
  Administered 2020-01-31: 81 mg via ORAL
  Filled 2020-01-30: qty 1

## 2020-01-30 MED ORDER — METOPROLOL TARTRATE 5 MG/5ML IV SOLN
2.5000 mg | Freq: Once | INTRAVENOUS | Status: AC
  Administered 2020-01-30: 2.5 mg via INTRAVENOUS

## 2020-01-30 MED ORDER — LACTATED RINGERS IV SOLN: INTRAVENOUS | Status: DC

## 2020-01-30 MED ORDER — ACETAMINOPHEN 325 MG PO TABS: 650.0000 mg | ORAL_TABLET | Freq: Four times a day (QID) | ORAL | Status: DC | PRN

## 2020-01-30 MED ORDER — CHLORHEXIDINE GLUCONATE 0.12 % MT SOLN
OROMUCOSAL | Status: AC
  Administered 2020-01-30: 15 mL via OROMUCOSAL
  Filled 2020-01-30: qty 15

## 2020-01-30 MED ORDER — POLYETHYLENE GLYCOL 3350 17 G PO PACK: 17.0000 g | PACK | Freq: Every day | ORAL | Status: DC | PRN

## 2020-01-30 MED ORDER — CHLORHEXIDINE GLUCONATE 0.12 % MT SOLN: 15.0000 mL | Freq: Once | OROMUCOSAL | Status: AC

## 2020-01-30 MED ORDER — LIDOCAINE HCL 1 % IJ SOLN
INTRAMUSCULAR | Status: DC | PRN
  Administered 2020-01-30: 15 mL

## 2020-01-30 MED ORDER — AMLODIPINE BESYLATE 5 MG PO TABS
10.0000 mg | ORAL_TABLET | Freq: Every day | ORAL | Status: AC
Start: 2020-01-30 — End: 2020-04-29

## 2020-01-30 MED ORDER — FENTANYL CITRATE (PF) 100 MCG/2ML IJ SOLN
25.0000 ug | INTRAMUSCULAR | Status: DC | PRN
  Administered 2020-01-30: 50 ug via INTRAVENOUS

## 2020-01-30 MED ORDER — HYDROCODONE-HOMATROPINE 5-1.5 MG/5ML PO SYRP: 5.0000 mL | ORAL_SOLUTION | Freq: Four times a day (QID) | ORAL | Status: DC | PRN

## 2020-01-30 MED ORDER — DILTIAZEM HCL 25 MG/5ML IV SOLN
10.0000 mg | Freq: Once | INTRAVENOUS | Status: AC
  Administered 2020-01-30: 10 mg via INTRAVENOUS
  Filled 2020-01-30: qty 5

## 2020-01-30 MED ORDER — ACETAMINOPHEN 650 MG RE SUPP: 650.0000 mg | Freq: Four times a day (QID) | RECTAL | Status: DC | PRN

## 2020-01-30 MED ORDER — PHENYLEPHRINE HCL-NACL 10-0.9 MG/250ML-% IV SOLN
INTRAVENOUS | Status: DC | PRN
  Administered 2020-01-30: 40 ug/min via INTRAVENOUS

## 2020-01-30 MED ORDER — ALBUMIN HUMAN 5 % IV SOLN: INTRAVENOUS | Status: DC | PRN

## 2020-01-30 MED ORDER — ONDANSETRON HCL 4 MG/2ML IJ SOLN: 4.0000 mg | Freq: Once | INTRAMUSCULAR | Status: DC | PRN

## 2020-01-30 MED ORDER — FUROSEMIDE 40 MG PO TABS: 20.0000 mg | ORAL_TABLET | ORAL | Status: DC

## 2020-01-30 MED ORDER — DOXAZOSIN MESYLATE 4 MG PO TABS
4.0000 mg | ORAL_TABLET | Freq: Every day | ORAL | Status: DC
  Administered 2020-01-31: 4 mg via ORAL
  Filled 2020-01-30: qty 2
  Filled 2020-01-30: qty 1

## 2020-01-30 MED ORDER — FENTANYL CITRATE (PF) 100 MCG/2ML IJ SOLN
INTRAMUSCULAR | Status: AC
  Filled 2020-01-30: qty 2

## 2020-01-30 MED ORDER — DILTIAZEM HCL 30 MG PO TABS
30.0000 mg | ORAL_TABLET | Freq: Four times a day (QID) | ORAL | Status: DC
  Administered 2020-01-30 – 2020-01-31 (×2): 30 mg via ORAL
  Filled 2020-01-30 (×5): qty 1

## 2020-01-30 MED ORDER — OXYCODONE HCL 5 MG/5ML PO SOLN: 5.0000 mg | Freq: Once | ORAL | Status: DC | PRN

## 2020-01-30 MED ORDER — HEPARIN SODIUM (PORCINE) 5000 UNIT/ML IJ SOLN
5000.0000 [IU] | Freq: Three times a day (TID) | INTRAMUSCULAR | Status: DC
  Administered 2020-01-31: 5000 [IU] via SUBCUTANEOUS

## 2020-01-30 MED ORDER — OXYCODONE HCL 5 MG PO TABS: 5.0000 mg | ORAL_TABLET | Freq: Once | ORAL | Status: DC | PRN

## 2020-01-30 MED ORDER — ALBUTEROL SULFATE (2.5 MG/3ML) 0.083% IN NEBU: 2.5000 mg | INHALATION_SOLUTION | RESPIRATORY_TRACT | Status: DC | PRN

## 2020-01-30 MED ORDER — BENZONATATE 100 MG PO CAPS: 200.0000 mg | ORAL_CAPSULE | Freq: Three times a day (TID) | ORAL | Status: DC | PRN

## 2020-01-30 MED ORDER — EPHEDRINE SULFATE 50 MG/ML IJ SOLN
INTRAMUSCULAR | Status: DC | PRN
Start: 2020-01-30 — End: 2020-01-30
  Administered 2020-01-30: 10 mg via INTRAVENOUS
  Administered 2020-01-30: 5 mg via INTRAVENOUS

## 2020-01-30 MED ORDER — EPINEPHRINE 1 MG/10ML IJ SOSY
PREFILLED_SYRINGE | INTRAMUSCULAR | Status: AC
  Filled 2020-01-30: qty 10

## 2020-01-30 MED ORDER — LIDOCAINE 2% (20 MG/ML) 5 ML SYRINGE
INTRAMUSCULAR | Status: DC | PRN
  Administered 2020-01-30: 40 mg via INTRAVENOUS

## 2020-01-30 MED ORDER — SODIUM CHLORIDE (PF) 0.9 % IJ SOLN
PREFILLED_SYRINGE | INTRAMUSCULAR | Status: DC | PRN
  Administered 2020-01-30: 4 mL

## 2020-01-30 MED ORDER — ONDANSETRON HCL 4 MG/2ML IJ SOLN
INTRAMUSCULAR | Status: DC | PRN
  Administered 2020-01-30: 4 mg via INTRAVENOUS

## 2020-01-30 MED ORDER — PROPOFOL 10 MG/ML IV BOLUS
INTRAVENOUS | Status: DC | PRN
  Administered 2020-01-30: 120 mg via INTRAVENOUS

## 2020-01-30 MED ORDER — ONDANSETRON HCL 4 MG/2ML IJ SOLN: 4.0000 mg | Freq: Four times a day (QID) | INTRAMUSCULAR | Status: DC | PRN

## 2020-01-30 MED ORDER — ROCURONIUM BROMIDE 10 MG/ML (PF) SYRINGE
PREFILLED_SYRINGE | INTRAVENOUS | Status: DC | PRN
  Administered 2020-01-30: 10 mg via INTRAVENOUS
  Administered 2020-01-30: 60 mg via INTRAVENOUS

## 2020-01-30 MED ORDER — HYDROCODONE-ACETAMINOPHEN 5-325 MG PO TABS
1.0000 | ORAL_TABLET | ORAL | Status: DC | PRN
  Administered 2020-01-30 – 2020-01-31 (×2): 1 via ORAL
  Filled 2020-01-30 (×2): qty 1

## 2020-01-30 MED ORDER — ONDANSETRON HCL 4 MG PO TABS: 4.0000 mg | ORAL_TABLET | Freq: Four times a day (QID) | ORAL | Status: DC | PRN

## 2020-01-30 MED ORDER — DEXAMETHASONE SODIUM PHOSPHATE 10 MG/ML IJ SOLN
INTRAMUSCULAR | Status: DC | PRN
  Administered 2020-01-30: 5 mg via INTRAVENOUS

## 2020-01-30 MED ORDER — FENTANYL CITRATE (PF) 100 MCG/2ML IJ SOLN: 25.0000 ug | INTRAMUSCULAR | Status: DC | PRN

## 2020-01-30 MED ORDER — FENTANYL CITRATE (PF) 100 MCG/2ML IJ SOLN
INTRAMUSCULAR | Status: DC | PRN
  Administered 2020-01-30: 100 ug via INTRAVENOUS

## 2020-01-30 MED ORDER — LIDOCAINE HCL (PF) 1 % IJ SOLN
INTRAMUSCULAR | Status: AC
  Filled 2020-01-30: qty 30

## 2020-01-30 MED ORDER — ESMOLOL HCL 100 MG/10ML IV SOLN
INTRAVENOUS | Status: DC | PRN
  Administered 2020-01-30: 10 mg via INTRAVENOUS
  Administered 2020-01-30 (×2): 20 mg via INTRAVENOUS

## 2020-01-30 MED ORDER — DOXAZOSIN MESYLATE 2 MG PO TABS
8.0000 mg | ORAL_TABLET | Freq: Every day | ORAL | Status: DC
  Administered 2020-01-30: 8 mg via ORAL
  Filled 2020-01-30: qty 4

## 2020-01-30 MED ORDER — ROSUVASTATIN CALCIUM 20 MG PO TABS
20.0000 mg | ORAL_TABLET | ORAL | Status: DC
  Administered 2020-01-31: 20 mg via ORAL
  Filled 2020-01-30: qty 1

## 2020-01-30 MED ORDER — PANTOPRAZOLE SODIUM 40 MG PO TBEC
40.0000 mg | DELAYED_RELEASE_TABLET | Freq: Every day | ORAL | Status: DC
  Administered 2020-01-31: 40 mg via ORAL
  Filled 2020-01-30: qty 1

## 2020-01-30 SURGICAL SUPPLY — 29 items
BRUSH CYTOL CELLEBRITY 1.5X140 (MISCELLANEOUS) IMPLANT
CANISTER SUCT 3000ML PPV (MISCELLANEOUS) ×3 IMPLANT
CONT SPEC 4OZ CLIKSEAL STRL BL (MISCELLANEOUS) ×3 IMPLANT
COVER BACK TABLE 60X90IN (DRAPES) ×3 IMPLANT
COVER DOME SNAP 22 D (MISCELLANEOUS) ×3 IMPLANT
FORCEPS BIOP RJ4 1.8 (CUTTING FORCEPS) IMPLANT
GAUZE SPONGE 4X4 12PLY STRL (GAUZE/BANDAGES/DRESSINGS) ×3 IMPLANT
GLOVE BIO SURGEON STRL SZ7.5 (GLOVE) ×3 IMPLANT
GOWN STRL REUS W/ TWL LRG LVL3 (GOWN DISPOSABLE) ×2 IMPLANT
GOWN STRL REUS W/TWL LRG LVL3 (GOWN DISPOSABLE) ×3
KIT CLEAN ENDO COMPLIANCE (KITS) ×6 IMPLANT
KIT TURNOVER KIT B (KITS) ×3 IMPLANT
MARKER SKIN DUAL TIP RULER LAB (MISCELLANEOUS) ×3 IMPLANT
NEEDLE EBUS SONO TIP PENTAX (NEEDLE) ×3 IMPLANT
NS IRRIG 1000ML POUR BTL (IV SOLUTION) ×3 IMPLANT
OIL SILICONE PENTAX (PARTS (SERVICE/REPAIRS)) ×3 IMPLANT
PAD ARMBOARD 7.5X6 YLW CONV (MISCELLANEOUS) ×6 IMPLANT
SOL ANTI FOG 6CC (MISCELLANEOUS) ×2 IMPLANT
SOLUTION ANTI FOG 6CC (MISCELLANEOUS) ×1
SYR 20CC LL (SYRINGE) ×6 IMPLANT
SYR 20ML ECCENTRIC (SYRINGE) ×6 IMPLANT
SYR 50ML SLIP (SYRINGE) IMPLANT
SYR 5ML LUER SLIP (SYRINGE) ×3 IMPLANT
TOWEL OR 17X24 6PK STRL BLUE (TOWEL DISPOSABLE) ×3 IMPLANT
TRAP SPECIMEN MUCOUS 40CC (MISCELLANEOUS) IMPLANT
TUBE CONNECTING 20X1/4 (TUBING) ×6 IMPLANT
UNDERPAD 30X30 (UNDERPADS AND DIAPERS) ×3 IMPLANT
VALVE DISPOSABLE (MISCELLANEOUS) ×3 IMPLANT
WATER STERILE IRR 1000ML POUR (IV SOLUTION) ×3 IMPLANT

## 2020-01-30 NOTE — Transfer of Care (Signed)
Immediate Anesthesia Transfer of Care Note  Patient: Syria L Jafari  Procedure(s) Performed: VIDEO BRONCHOSCOPY WITHOUT FLUORO (N/A ) INSERTION PLEURAL DRAINAGE CATHETER (N/A ) HEMOSTASIS CONTROL BRONCHIAL BIOPSIES BRONCHIAL BRUSHINGS  Patient Location: PACU  Anesthesia Type:General  Level of Consciousness: awake and alert   Airway & Oxygen Therapy: Patient Spontanous Breathing, Patient connected to face mask oxygen and Patient connected to face mask  Post-op Assessment: Report given to RN, Post -op Vital signs reviewed and stable and Patient moving all extremities  Post vital signs: Reviewed and stable  Last Vitals:  Vitals Value Taken Time  BP 141/107 01/30/20 1323  Temp    Pulse 104 01/30/20 1327  Resp 12 01/30/20 1327  SpO2 100 % 01/30/20 1327  Vitals shown include unvalidated device data.  Last Pain:  Vitals:   01/30/20 0935  TempSrc:   PainSc: 0-No pain         Complications: No complications documented.

## 2020-01-30 NOTE — Telephone Encounter (Signed)
PCCM:  Pleur-ex (IPC) was placed on 01/30/2020   Please arrange home health with [facility] for drainage and teaching.  Drainage recommended to start daily   Please schedule office visit with RB or APP within 7-10 days for wound check and suture removal.   Garner Nash, DO [facility] 01/30/2020 12:32 PM

## 2020-01-30 NOTE — Progress Notes (Signed)
Patient arrived from PACU at 1545.  CHG given.  Vitals taken.  Skin assessment completed. Patient arrived in a-fib RVR .  Per PACU report cardizem push given and PO scheduled.   PO given per order.  Per CCMD patient converted to SR / junctional rate in 50's.  Patient asymptomatic and states her baseline is in the 60's.

## 2020-01-30 NOTE — Telephone Encounter (Signed)
Patient is scheduled with Brain Mack on 6/25 to wound check/suture removal. She has an appointment scheduled with Judson Roch this week on 6/18 can this appointment be canceled?

## 2020-01-30 NOTE — Discharge Instructions (Signed)
Flexible Bronchoscopy, Care After This sheet gives you information about how to care for yourself after your test. Your doctor may also give you more specific instructions. If you have problems or questions, contact your doctor. Follow these instructions at home: Eating and drinking  Do not eat or drink anything (not even water) for 2 hours after your test, or until your numbing medicine (local anesthetic) wears off.  When your numbness is gone and your cough and gag reflexes have come back, you may: ? Eat only soft foods. ? Slowly drink liquids.  The day after the test, go back to your normal diet. Driving  Do not drive for 24 hours if you were given a medicine to help you relax (sedative).  Do not drive or use heavy machinery while taking prescription pain medicine. General instructions   Take over-the-counter and prescription medicines only as told by your doctor.  Return to your normal activities as told. Ask what activities are safe for you.  Do not use any products that have nicotine or tobacco in them. This includes cigarettes and e-cigarettes. If you need help quitting, ask your doctor.  Keep all follow-up visits as told by your doctor. This is important. It is very important if you had a tissue sample (biopsy) taken. Get help right away if:  You have shortness of breath that gets worse.  You get light-headed.  You feel like you are going to pass out (faint).  You have chest pain.  You cough up: ? More than a little blood. ? More blood than before. Summary  Do not eat or drink anything (not even water) for 2 hours after your test, or until your numbing medicine wears off.  Do not use cigarettes. Do not use e-cigarettes.  Get help right away if you have chest pain.  Please call our office for any questions or concerns. [phone].     Indwelling Pleural Catheter Home Guide  An indwelling pleural catheter is a thin, flexible tube that is inserted under  your skin and into your chest. The catheter drains excess fluid that collects in the area between the chest wall and the lungs (pleural space).After the catheter is inserted, it can be attached to a bottle that collects fluid. The pleural catheter will allow you to drain fluid from your chest at home on a regular basis (sometimes daily). This will eliminate the need for frequent visits to the hospital or clinic to drain the fluid. The catheter may be removed after the excess fluid problem is resolved, usually after 2-3 months. It is important to follow instructions from your health care provider about how to drain and care for your catheter. What are the risks? Generally, this is a safe procedure. However, problems may occur, including:  Infection.  Skin damage around the catheter.  Lung damage.  Failure of the chest tube to work properly.  Spreading of cancer cells along the catheter, if you have cancer. Supplies needed:  Vacuum-sealed drainage bottle with attached drainage line.  Sterile dressing.  Sterile alcohol pads.  Sterile gloves.  Valve cap.  Sterile gauze pads, 4  4 inch (10 cm  10 cm).  Tape.  Adhesive dressing.  Sterile foam catheter pad. How to care for your catheter and insertion site  Wash your hands with soap and warm water before and after touching the catheter or insertion site. If soap and water are not available, use hand sanitizer.  Check your bandage (dressing) daily to make sure it is  clean and dry.  Keep the skin around the catheter clean and dry.  Check the catheter regularly for any cracks or kinks in the tubing.  Check your catheter insertion site every day for signs of infection. Check for: ? Skin breakdown. ? Redness, swelling, or pain. ? Fluid or blood. ? Warmth. ? Pus or a bad smell. How to drain your catheter You may need to drain your catheter every day, or more or less often as told by your health care provider. Follow instructions  from your health care provider about how to drain your catheter. You may also refer to instructions that come with the drainage system. To drain the catheter: 1. Wash your hands with soap and warm water. If soap and water are not available, use hand sanitizer. 2. Carefully remove the dressing from around the catheter. 3. Wash your hands again. 4. Put on the gloves provided. 5. Prepare the vacuum-sealed drainage bottle and drainage line. Close the drainage line of the vacuum-sealed drainage bottle by squeezing the pinch clamp or rolling the wheel of the roller clamp toward the bottle. The vacuum in the bottle will be lost if the line is not closed completely. 6. Remove the access tip cover from the drainage line. Do not touch the end. Set it on a sterile surface. 7. Remove the catheter valve cap and throw it away. 8. Use an alcohol pad to clean the end of the catheter. 9. Insert the access tip into the catheter valve. Make sure the valve and access tip are securely connected. Listen for a click to confirm that they are connected. 10. Insert the T plunger to break the vacuum seal on the drainage bottle. 11. Open the clamp on the drainage line. 12. Allow the catheter to drain. Keep the catheter and the drainage bottle below the level of your chest. There may be a one-way valve on the end of the tubing that will allow liquid and air to flow out of the catheter without letting air inside. 13. Drain the amount of fluid as told by your health care provider. It usually takes 5-15 minutes. Do not drain more than 1000 mL of fluid. You may feel a little discomfort while you are draining. If the pain is severe, stop draining and contact your health care provider. 14. After you finish draining the catheter, remove the drainage bottle tubing from the catheter. 15. Use a clean alcohol pad to wipe the catheter tip. 16. Place a clean cap on the end of the catheter. 17. Use an alcohol pad to clean the skin around the  catheter. 18. Allow the skin to air-dry. 19. Put the catheter pad on your skin. Curl the catheter into loops and place it on the pad. Do not place the catheter on your skin. 20. Replace the dressing over the catheter. 21. Discard the drainage bottle as instructed by your health care provider. Do not reuse the drainage bottle. How to change your dressing Change your dressing at least once a week, or more often if needed to keep the dressing dry. Be sure to change the dressing whenever it becomes moist. Your health care provider will tell you how often to change your dressing. 1. Wash your hands with soap and warm water. If soap and water are not available, use hand sanitizer. 2. Gently remove the old dressing. Avoid using scissors to remove the dressing. Sharp objects may damage the catheter. 3. Wash the skin around the insertion site with mild, fragrance-free soap and  warm water. Rinse well, then pat the area dry with a clean cloth. 4. Check the skin around the catheter for signs of infection. Check for: ? Skin breakdown. ? Redness, swelling, or pain. ? Fluid or blood. ? Warmth. ? Pus or a bad smell. 5. If your catheter was stitched (sutured) to your skin, look at the suture to make sure it is still anchored in your skin. 6. Do not apply creams, ointments, or alcohol to the area. Let your skin air-dry completely before you apply a new dressing. 7. Curl the catheter into loops and place it on the sterile catheter pad. Do not place the catheter on your skin. 8. If you do not have a pad, use a clean dressing. Slide the dressing under the disk that holds the drainage catheter in place. 9. Use gauze to cover the catheter and the catheter pad. The catheter should rest on the pad or dressing, not on your skin. 10. Tape the dressing to your skin. You may be instructed to use an adhesive dressing covering instead of gauze and tape. 11. Wash your hands with soap and warm water. If soap and water are not  available, use hand sanitizer. General recommendations  Always wash your hands with soap and warm water before and after caring for your catheter and drainage bottle. Use a mild, fragrance-free soap. If soap and water are not available, use hand sanitizer.  Always make sure there are no leaks in the catheter or drainage bottle.  Each time you drain the catheter, note the color and amount of fluid.  Do not touch the tip of the catheter or the drainage bottle tubing.  Do not reuse drainage bottles.  Do not take baths, swim, or use a hot tub until your health care provider approves. Ask your health care provider if you may take showers. You may only be allowed to take sponge baths.  Take deep breaths regularly, followed by a cough. Doing this can help to prevent lung infection. Contact a health care provider if:  You have any questions about caring for your catheter or drainage bottle.  You still have pain at the catheter insertion site more than 2 days after your procedure.  You have pain while draining your catheter.  Your catheter becomes bent, twisted, or cracked.  The connection between the catheter and the collection bottle becomes loose.  You have any of these around your catheter insertion site or coming from it: ? Skin breakdown. ? Redness, swelling, or pain. ? Fluid or blood. ? Warmth. ? Pus or a bad smell. Get help right away if:  You have a fever or chills.  You have chest pain.  You have dizziness or shortness of breath.  You have severe redness, swelling, or pain at your catheter insertion site.  The catheter comes out.  The catheter is blocked or clogged. Summary  An indwelling pleural catheter is a thin, flexible tube that is inserted under your skin and into your chest. The catheter drains excess fluid that collects in the area between the chest wall and the lungs (pleural space).  It is important to follow instructions from your health care provider  about how to drain and care for your catheter.  Do not touch the tip of the catheter or the drainage bottle tubing.  Always wash your hands with soap and water before and after caring for your catheter and drainage bottle. If soap and water are not available, use hand sanitizer. This information is  not intended to replace advice given to you by your health care provider. Make sure you discuss any questions you have with your health care provider. Document Revised: 11/25/2018 Document Reviewed: 11/26/2016 Elsevier Patient Education  [place].

## 2020-01-30 NOTE — Telephone Encounter (Signed)
Yes, cancel this week.  Thanks [place], DO [facility] 01/30/2020 5:40 PM

## 2020-01-30 NOTE — Anesthesia Postprocedure Evaluation (Signed)
Anesthesia Post Note  Patient: Hailey Cobb  Procedure(s) Performed: VIDEO BRONCHOSCOPY WITHOUT FLUORO (N/A ) INSERTION PLEURAL DRAINAGE CATHETER (N/A ) HEMOSTASIS CONTROL BRONCHIAL BIOPSIES BRONCHIAL BRUSHINGS     Patient location during evaluation: PACU Anesthesia Type: General Level of consciousness: awake and alert Pain management: pain level controlled Vital Signs Assessment: post-procedure vital signs reviewed and stable Respiratory status: spontaneous breathing, nonlabored ventilation and respiratory function stable Cardiovascular status: blood pressure returned to baseline, stable and tachycardic Postop Assessment: no apparent nausea or vomiting Anesthetic complications: no   No complications documented.  Last Vitals:  Vitals:   01/30/20 1323 01/30/20 1345  BP:    Pulse:  (!) 102  Resp:  (!) 26  Temp: 36.5 C   SpO2:  92%    Last Pain:  Vitals:   01/30/20 1350  TempSrc:   PainSc: 10-Worst pain ever                 Lidia Collum

## 2020-01-30 NOTE — Addendum Note (Signed)
Addended by: Lia Foyer R on: 01/30/2020 05:16 PM   Modules accepted: Orders

## 2020-01-30 NOTE — Anesthesia Procedure Notes (Signed)
Procedure Name: Intubation Date/Time: 01/30/2020 11:45 AM Performed by: Alain Marion, CRNA Pre-anesthesia Checklist: Patient identified, Emergency Drugs available, Suction available and Patient being monitored Patient Re-evaluated:Patient Re-evaluated prior to induction Oxygen Delivery Method: Circle System Utilized Preoxygenation: Pre-oxygenation with 100% oxygen Induction Type: IV induction Ventilation: Mask ventilation without difficulty Laryngoscope Size: Glidescope and 3 Grade View: Grade I Tube type: Oral Tube size: 8.0 mm Number of attempts: 1 Airway Equipment and Method: Stylet Placement Confirmation: ETT inserted through vocal cords under direct vision,  positive ETCO2 and breath sounds checked- equal and bilateral Secured at: 22 cm Tube secured with: Tape Dental Injury: Teeth and Oropharynx as per pre-operative assessment  Comments: Performed by Reece Agar, SRNA under the supervision of CRNA and MDA

## 2020-01-30 NOTE — Op Note (Signed)
PleurX Catheter Instertion Procedure Note  Date of Operation: 01/30/2020  Pre-op Diagnosis: Left sided effusion   Post-op Diagnosis: Left sided effusion   Surgeon: Garner Nash, DO  Assistants: Clarise Cruz Treat, MS4  Anesthesia: per anesthesia  Meds Given: per anesthesia   Operation: PleurX catheter insertion on the left   Estimated Blood Loss: <9HB  Complications: none noted  Indications and History: Hailey Cobb is 86 y.o. 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  Recommendation was to perform Pleurx catheter insertion. The risks, benefits, potential complications, treatment options, and expected outcomes were discussed with the patient.  The possibilities of pneumothorax, infection, or damage to surrounding structures were discussed with the patient who freely signed the consent.    Description of Procedure: The patient was seen in the preoperative area, was examined and was deemed appropriate to proceed.  The patient was taken to [facility] Endo Room 2, identified as Hailey Cobb with date of birth [birth year] and MRN [id number], and the procedure verified as Pleurx catheter placement.  A Time Out was held and the above information confirmed.   With the patient in the semirecumbent position, ultrasound was used to locate fluid in the left pleural space.  The site was marked and the area thoroughly cleaned with chlorhexidine.  The area was anesthetized with 1% lidocaine, including through subcutaneous tissues to the pleura.  Pleural fluid was withdrawn with the finder needle.  The procedure needle was advanced through the same tract into the pleural fluid, where fluid was again withdrawn.  The guidewire was advanced into the pleural space with subsequent removal of the needle.  A few centimeters anteriorly a skin incision was made, followed by a skin incision by the guidewire.  A Pleurx catheter attached to a soft tissue dissector was advanced through the anterior incision with  tunneling through the subcutaneous tissues to the posterior incision.  The Pleurx catheter was placed with the cuff appropriately about 1 cm from the anterior incision.  The soft tissue dissector was separated from a the Pleurx catheter.  Dilator was advanced over the guidewire into the pleural space and withdrawn.  The guidewire and peel-away catheter were advanced over the guidewire into the pleural space, followed by removal of the dilator and guidewire while leaving the peel-away catheter in place.  The Pleurx catheter was then advanced through the peel-away catheter with the catheter removed in its entirety. The pleurX catheter was connected to a sterile pleuro-vac with successful drainage of pleural fluid. The catheter was secured in place with sutures. The posterior incision was closed with 2 suture. Following successful drainage of 400cc of pleural fluid, catheter remained connected to pleuro-vac. A sterile dressing was applied.   Samples: 1. Pleural fluid not sent. Please see previous sample collections   Plans:  Home health has been ordered, as well as bottles for home drainage. Follow up in Pulmonary clinic has been arranged for suture removal.  Hailey Cobb 01/30/20 12:25 PM [facility]

## 2020-01-30 NOTE — Interval H&P Note (Signed)
History and Physical Interval Note:  01/30/2020 11:21 AM  Hailey Cobb  has presented today for surgery, with the diagnosis of CAVITATED MASS OF LEFT LOWR LUNG.  The various methods of treatment have been discussed with the patient and family. After consideration of risks, benefits and other options for treatment, the patient has consented to  Procedure(s): VIDEO BRONCHOSCOPY WITH ENDOBRONCHIAL ULTRASOUND (N/A) INSERTION PLEURAL DRAINAGE CATHETER (N/A) as a surgical intervention.  The patient's history has been reviewed, patient examined, no change in status, stable for surgery.  I have reviewed the patient's chart and labs.  Questions were answered to the patient's satisfaction.     Collene Gobble
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
## 2020-01-31 ENCOUNTER — Other Ambulatory Visit: Payer: Self-pay | Admitting: Emergency Medicine

## 2020-01-31 ENCOUNTER — Telehealth: Payer: Self-pay

## 2020-01-31 ENCOUNTER — Encounter (HOSPITAL_COMMUNITY): Payer: Self-pay | Admitting: Pulmonary Disease

## 2020-01-31 ENCOUNTER — Observation Stay (HOSPITAL_COMMUNITY): Payer: Medicare Other

## 2020-01-31 ENCOUNTER — Telehealth: Payer: Self-pay | Admitting: Pulmonary Disease

## 2020-01-31 ENCOUNTER — Observation Stay (HOSPITAL_BASED_OUTPATIENT_CLINIC_OR_DEPARTMENT_OTHER): Payer: Medicare Other

## 2020-01-31 DIAGNOSIS — I361 Nonrheumatic tricuspid (valve) insufficiency: Secondary | ICD-10-CM | POA: Diagnosis not present

## 2020-01-31 DIAGNOSIS — J9811 Atelectasis: Secondary | ICD-10-CM | POA: Diagnosis not present

## 2020-01-31 DIAGNOSIS — I340 Nonrheumatic mitral (valve) insufficiency: Secondary | ICD-10-CM

## 2020-01-31 DIAGNOSIS — I97191 Other postprocedural cardiac functional disturbances following other surgery: Secondary | ICD-10-CM | POA: Diagnosis not present

## 2020-01-31 DIAGNOSIS — J984 Other disorders of lung: Secondary | ICD-10-CM

## 2020-01-31 DIAGNOSIS — J90 Pleural effusion, not elsewhere classified: Secondary | ICD-10-CM | POA: Diagnosis not present

## 2020-01-31 DIAGNOSIS — J939 Pneumothorax, unspecified: Secondary | ICD-10-CM | POA: Diagnosis not present

## 2020-01-31 LAB — BASIC METABOLIC PANEL
Anion gap: 7 (ref 5–15)
BUN: 17 mg/dL (ref 8–23)
CO2: 25 mmol/L (ref 22–32)
Calcium: 8.7 mg/dL — ABNORMAL LOW (ref 8.9–10.3)
Chloride: 100 mmol/L (ref 98–111)
Creatinine, Ser: 0.69 mg/dL (ref 0.44–1.00)
GFR calc Af Amer: >60 mL/min (ref >60)
GFR calc non Af Amer: >60 mL/min (ref >60)
Glucose, Bld: 191 mg/dL — ABNORMAL HIGH (ref 70–99)
Potassium: 5.2 mmol/L — ABNORMAL HIGH (ref 3.5–5.1)
Sodium: 132 mmol/L — ABNORMAL LOW (ref 135–145)

## 2020-01-31 LAB — CBC
HCT: 26.1 % — ABNORMAL LOW (ref 36.0–46.0)
Hemoglobin: 8.1 g/dL — ABNORMAL LOW (ref 12.0–15.0)
MCH: 27.9 pg (ref 26.0–34.0)
MCHC: 31 g/dL (ref 30.0–36.0)
MCV: 90 fL (ref 80.0–100.0)
Platelets: 217 10*3/uL (ref 150–400)
RBC: 2.9 MIL/uL — ABNORMAL LOW (ref 3.87–5.11)
RDW: 15 % (ref 11.5–15.5)
WBC: 5.7 10*3/uL (ref 4.0–10.5)
nRBC: 0 % (ref 0.0–0.2)

## 2020-01-31 LAB — ECHOCARDIOGRAM COMPLETE
Height: 64 in
Weight: 2896 oz

## 2020-01-31 LAB — SURGICAL PATHOLOGY

## 2020-01-31 LAB — GLUCOSE, CAPILLARY
Glucose-Capillary: 140 mg/dL — ABNORMAL HIGH (ref 70–99)
Glucose-Capillary: 162 mg/dL — ABNORMAL HIGH (ref 70–99)
Glucose-Capillary: 157 mg/dL — ABNORMAL HIGH (ref 70–99)

## 2020-01-31 LAB — CYTOLOGY - NON PAP

## 2020-01-31 MED ORDER — LEVETIRACETAM 500 MG PO TABS
500.0000 mg | ORAL_TABLET | Freq: Every day | ORAL | Status: DC
  Administered 2020-01-31: 500 mg via ORAL
  Filled 2020-01-31: qty 1

## 2020-01-31 MED ORDER — ALPRAZOLAM 0.5 MG PO TABS
0.5000 mg | ORAL_TABLET | Freq: Once | ORAL | Status: AC
  Administered 2020-01-31: 0.5 mg via ORAL
  Filled 2020-01-31: qty 1

## 2020-01-31 MED ORDER — METFORMIN HCL 500 MG PO TABS: 1000.0000 mg | ORAL_TABLET | Freq: Two times a day (BID) | ORAL | Status: DC

## 2020-01-31 MED ORDER — LEVOTHYROXINE SODIUM 25 MCG PO TABS: 125.0000 ug | ORAL_TABLET | Freq: Every day | ORAL | Status: DC

## 2020-01-31 NOTE — Discharge Summary (Signed)
Physician Discharge Summary  Patient ID: Hailey Cobb MRN: [id number] DOB/AGE: Mar 07, 1932 86 y.o.  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  Seizures Hypothyroidism Hypertension.      DISCHARGE PLAN BY DIAGNOSIS    Postop atrial fibrillation -Converted to sinus bradycardia 6/15 Additionally history of tachybradycardia syndrome -Repeat ECHO obtain 6/16 results below  History of hypertension  Plan: Diltiazem stopped 6/16 Continue home Lasix, Amlodipine, Cardura, and Avapro to be continued at discharge   Status post bronchoscopy -Preformed 6/15 Left lung mass -Concerning for primary bronchogenic carcinoma Recurrent left-sided pleural effusion -S/P thoracentesis 6/3 with 1.2L removed and again 6/9 with 1.4L removed. Concern that effusion is malignant in nature, pathology pending   -Thoracentesis 6/3 was seen with cytology atypical cells present, repeat thoracentesis 6/9  Was seen with reactive mesothelial cells present  Plan: Pathology pending, follow-up when available  Will refer patient to Multi-disciplinary Thoracic On[facility] PleurX cath placed 6/15 ~2.5L removed since insertion  TOC care team consulted for assistance in obtaining equipment at discharge  Patient would benefit from home health nurse at discharge  Follow up in office with Eric Form 02/02/2020  Postop pain control Plan: Controled with PO opoid's during admission PRN tylenol and NSAID at discharge   Diabetes mellitus  Plan: Resume home regiment of Glucophage and Januvia at discharge   Seizures Plan: Continue preadmission [place] upon discharge    Hypothyroidism Plan: Continue preadmission  Synthroid at discharge   Hypertension Plan. Continue home Doxazosin and irbesartan at discharge                    Hailey Cobb is a 86 year old female, past medical history of arthritis, diabetes, anxiety, hypothyroidism, hypertension, hyperlipidemia, GERD and prior TAVR. Who presented for elective video bronchoscopy with endobronchial ultrasound on 6/16 and unfortunately required admission post procedure due to the development of A-fib.    It appears that Hailey Cobb underwent an outpatient Chest CT 12/29/2019 where a 6.9 x 6.3 cm speculated mass was seen in the left lower lobe. At that time a referral to [facility] was placed and she followed up with Dr. Shearon Stalls on 01/03/2020 who  recommended a PET scan. On 12/17/2019 the patient had a PET scan preformed that reveled  Intense FDG uptake associated with the left lower lobe cavitary lung mass.   Given evidence of mass without lymph node involvement patient was scheduled for a Bronch with possible biopsy by Dr. Lamonte Sakai on 01/18/2020 but on arrival she was seen with significant hypoxia with SPO2 70% on room air with new profound weakness, increased cough, left flank and back pain. Chest x-ray done in the preoperative area shows significant left lower lobe volume loss and an evolving now moderate to large pleural effusion. At that time bronchoscopy was canceled and patient was admitted to hospital. She underwent a  thoracentesis 01/18/2020 with 1.2L removed and cytology revealed atypical cells present. She was discharged home with plans to rescheduled bronchoscopy.   Seen again 01/24/2020 in the Pulmonary clinic by Wyn Quaker NP  for worsening orthopnea and shortness of breath, a stat chest x-ray revealed recurrent left pleural effusion. Dr. Valeta Harms preformed and in office thoracentesis with 1.4L removed with cytology showing reactive mesothelial  cells present   On 01/30/2020 patient presented for elective video bronchoscopy with  endobronchial ultrasound and placement of PleurX cath prior to bronchoscopy.  Post bronchoscopy patient was found to be in pain, short of breath, and tachycardia. Atrial fibrillation appeared to be on telemetry monitor with a heart rate in 130s.  Decision was made for admission to observation to the hospital.       By evening of 6/15 patient had transitioned back to sinus bradycardia. Morning of 01/31/2020 patient remained hemodynamically stable and back to baseline. She was ambulated with nursing and able to preform all ADL. Education on care for PleurX cath was done by nursing with both patient and daughter. Patient Is scheduled to follow up with Eric Form with [facility] 02/02/2020.  SIGNIFICANT DIAGNOSTIC STUDIES PTA  Chest CT W contrast 01/08/23 1. 6.9 x 6.3 cm, spiculated, mass-like density with mild central cavitation in the left lower lobe, encasing vessels and proximal bronchi with soft tissue density filling 2 of the proximal bronchi, suspicious for a primary lung carcinoma. Dense, consolidated, cavitary infection is also a possibility. This could be further evaluated with bronchoscopy and biopsy or PET-CT. 2. Dense consolidation extending more inferiorly in the left lower lobe, compatible dense atelectasis and possible spread of malignancy. 3. Small left pleural effusion. 4. Mild changes of COPD and chronic bronchitis. 5. Small hiatal hernia. 6.  Calcific coronary artery and aortic atherosclerosis.  PET scan 01/09/2020 1. Intense FDG uptake is associated with the left lower lobe cavitary lung mass. SUV max is equal to 13.4. Primary differential consideration includes necrotic tumor versus necrotizing pneumonia. Evidence of progressive postobstructive pneumonitis within the lingula and left lower lobe noted. The small left pleural effusion appears increased in volume from previous exam. 2. No FDG avid mediastinal or hilar lymph nodes or evidence of distant metastatic  disease 3. Aortic atherosclerosis and coronary artery calcifications. Aortic Atherosclerosis (ICD10-I70.0). 4.  Emphysema (ICD10-J43.9).  Chest X-ray 6/16  No pneumothorax. Unchanged PleurX catheter position on the left inferiorly. Left base atelectasis with small left pleural effusion remain. No new opacity. No cavitation evident. Stable cardiac prominence. Status post aortic valve replacement.  ECHO 6/16 > 1. Left ventricular ejection fraction, by estimation, is 60 to 65%. The  left ventricle has normal function. The left ventricle has no regional  wall motion abnormalities. There is moderate left ventricular hypertrophy  of the basal-septal segment. Left ventricular diastolic parameters are  consistent with Grade II diastolic dysfunction (pseudonormalization).  Elevated left ventricular end-diastolic pressure.  2. Right ventricular systolic function is normal. The right ventricular  size is normal. There is moderately elevated pulmonary artery systolic  pressure. The estimated right ventricular systolic pressure is 46.9 mmHg.  3. Left atrial size was severely dilated.  4. The mitral valve is degenerative. Mild mitral valve regurgitation. No  evidence of mitral stenosis.  5. The aortic valve has been repaired/replaced. There is a 26 mm Edwards  Sapien prosthetic, stented (TAVR) valve present in the aortic position.  Aortic valve regurgitation is not visualized. No aortic stenosis is  present.  6. The inferior vena cava is normal in size with greater than 50%  respiratory variability, suggesting right atrial pressure of 3  mmHgCompared to echo 05/2019, normal rfunctioning TAVR with mean AVG of  TAVR 25mHg which is essentially unchanged from  prior echo. PASP has increased from 332mg to 5671m.  MICRO DATA  None  ANTIBIOTICS None  CONSULTS None  TUBES / LINES None  Discharge Exam General:  Pleasant slightly deconditioned female lying in bed, in NAD HEENT: [place]/AT, MM  pink/moist, PERRL,  Neuro: Alert and oriented x3, non-focal  CV: s1s2 regular rate and rhythm, no murmur, rubs, or gallops,  PULM:  Diminished breath sounds in the left compared to the right, pleurx cath on left GI: soft, bowel sounds active in all 4 quadrants, non-tender, non-distended Extremities: warm/dry, no edema  Skin: no rashes or lesions   Vitals:   01/31/20 0452 01/31/20 0820 01/31/20 1015 01/31/20 1133  BP:  (!) 125/48 (!) 125/48 (!) 142/50  Pulse:  (!) 56  100  Resp:  20  13  Temp: 97.7 F (36.5 C) 97.6 F (36.4 C)  97.6 F (36.4 C)  TempSrc: Oral Oral  Oral  SpO2: 94% 97%  100%  Weight:      Height:         Discharge Labs  BMET Recent Labs  Lab 01/30/20 0908 01/30/20 0908 01/30/20 1644 01/31/20 0333  NA 132*  --  132* 132*  K 4.5   < > 4.7 5.2*  CL 99  --  98 100  CO2 24  --  26 25  GLUCOSE 139*  --  167* 191*  BUN 9  --  10 17  CREATININE 0.54  --  0.57 0.69  CALCIUM 8.9  --  8.9 8.7*  MG  --   --  1.3*  --   PHOS  --   --  3.6  --    < > = values in this interval not displayed.    CBC Recent Labs  Lab 01/30/20 0906 01/30/20 1644 01/31/20 0333  HGB 9.3* 9.3* 8.1*  HCT 29.8* 29.7* 26.1*  WBC 4.2 6.2 5.7  PLT 237 245 217    Anti-Coagulation No results for input(s): INR in the last 168 hours.  Discharge Instructions    Discharge patient   Complete by: As directed    After CXR has been reviewed   Discharge disposition: 01-Home or Self Care   Discharge patient date: 01/30/2020   Pleural Drainage Schedule   Complete by: As directed    Drain up to max of 1L until patient is only able to drain out 126m. If <1556mfor 3 consecutive drains then drain every other day. If <15045mor 3 consecutive drains every other day then call the practice that inserted the catheter for evaluation and possible removal. [facility] office ([phone] Interventional Radiology ([phone] Dr. OakGenevive [phone] [facility] ([phone]        Follow-up Information    [facility] On 02/02/2020.   Specialty: Pulmonary Disease Contact information: [id number] [place] [phone]             Allergies as of 01/31/2020      Reactions   Metoprolol Other (See Comments)   Bradycardia   Clonidine Derivatives Other (See Comments)   Dizziness   Glimepiride Other (See Comments)   Hypoglycemia   Invokana [canagliflozin] Other (See Comments)   Weakness, perineal irritation      Medication List    STOP taking these medications   pantoprazole 40 MG tablet Commonly known as: PROTONIX     TAKE these medications   ALPRAZolam 0.5 MG tablet Commonly known as: XANAX Take 0.5 mg by mouth 3 (three) times daily as needed for anxiety.   amLODipine 5 MG tablet Commonly known as: NORVASC Take 2 tablets (10 mg total) by mouth  daily.   amoxicillin 500 MG tablet Commonly known as: AMOXIL Take 4 capsules (2,082m) one hour prior to all dental visits.   aspirin 81 MG chewable tablet Chew 81 mg by mouth daily.   Astepro 0.15 % Soln Generic drug: Azelastine HCl Place 2 sprays into both nostrils at bedtime.   B-12 COMPLIANCE INJECTION IJ Inject as directed every 30 (thirty) days.   benzonatate 100 MG capsule Commonly known as: Tessalon Perles Take 2 capsules (200 mg total) by mouth 3 (three) times daily as needed for cough.   doxazosin 2 MG tablet Commonly known as: CARDURA Take 4-8 mg by mouth See admin instructions. Take 4 mg by mouth in the morning and 8 mg at bedtime   furosemide 40 MG tablet Commonly known as: LASIX Take 0.5-1 tablets (20-40 mg total) by mouth See admin instructions. Take 20 mg and 40 mg alternate every other day   HYDROcodone-acetaminophen 5-325 MG tablet Commonly known as: NORCO/VICODIN Take 1-2 tablets by mouth every 4 (four) hours as needed for severe pain. Do not take and drive   HYDROcodone-homatropine 5-1.5 MG/5ML syrup Commonly known as: HYCODAN Take  5 mLs by mouth every 6 (six) hours as needed for cough.   irbesartan 150 MG tablet Commonly known as: AVAPRO Take 150 mg by mouth 2 (two) times daily.   iron polysaccharides 150 MG capsule Commonly known as: NIFEREX Take 1 capsule (150 mg total) by mouth daily.   levETIRAcetam 500 MG tablet Commonly known as: KEPPRA Take 500 mg by mouth daily.   levothyroxine 125 MCG tablet Commonly known as: SYNTHROID Take 125 mcg by mouth daily before breakfast.   metFORMIN 1000 MG tablet Commonly known as: GLUCOPHAGE Take 1,000 mg by mouth 2 (two) times daily with a meal.   Mucinex Maximum Strength 1200 MG Tb12 Generic drug: Guaifenesin Take 1,200 mg by mouth in the morning and at bedtime.   OVER THE COUNTER MEDICATION Take 3 tablets by mouth daily. Thymic factor vitamins   polyvinyl alcohol 1.4 % ophthalmic solution Commonly known as: LIQUIFILM TEARS Place 1 drop into both eyes daily.   PRESERVISION AREDS PO Take 1 tablet by mouth daily.   rosuvastatin 20 MG tablet Commonly known as: CRESTOR Take 20 mg by mouth every other day.   sitaGLIPtin 100 MG tablet Commonly known as: JANUVIA Take 100 mg by mouth daily.         Disposition:   Discharged Condition: Hailey MULHEARNhas met maximum benefit of inpatient care and is medically stable and cleared for discharge.  Patient is pending follow up as above.      Time spent on disposition:  45 Minutes.   Signed: .WJohnsie Cancel NP-C [facility] Contact / Pager information can be found on Amion  01/31/2020, 2:40 PM

## 2020-01-31 NOTE — Telephone Encounter (Addendum)
Called and left message for daughter [name] letting her know that we are going to cancel appointment for this Friday and for her to keep appointment for next Friday 6/25. NOthing further needed at this time

## 2020-01-31 NOTE — Progress Notes (Signed)
Mobility Specialist: Progress Note    01/31/20 1602  Mobility  Activity Ambulated in hall  Level of Assistance Modified independent, requires aide device or extra time  Assistive Device Front wheel walker  Distance Ambulated (ft) 280 ft  Mobility Response Tolerated well  Mobility performed by Mobility specialist  $Mobility charge 1 Mobility   Pre-Mobility: 75 HR Post-Mobility: 68 HR  Pt had to take one standing rest break b/c c/o SOB, resolved quickly.   [facility] Kolin Erdahl Mobility Specialist

## 2020-01-31 NOTE — Telephone Encounter (Signed)
New order has been placed for home health. When the pt was here to see Dr. Valeta Harms, he had plans on putting in a pleurex catheter when he did her bronch. Pt should be given supplies for this when she leaves the hospital. At her last OV, her daughter was with her and seemed competent to be able to learn how to help with the catheter. We do not know at this time when the pt will be discharged from the hospital.

## 2020-01-31 NOTE — TOC Transition Note (Signed)
Transition of Care [facility]) - CM/SW Discharge Note Marvetta Gibbons RN, BSN Transitions of Care Unit 4E- RN Case Manager [phone]   Patient Details  Name: Hailey Cobb MRN: [id number] Date of Birth: May 21, 1932  Transition of Care [facility]) CM/SW Contact:  Dawayne Patricia, RN Phone Number: 01/31/2020, 4:09 PM   Clinical Narrative:    Pt s/p pleurX cath placemet- referral received for [facility] needs for PleurX cath drainage needs- [employer] form placed on shadow chart and has been signed- faxed to [place] and original will be mailed. Spoke with pt and daughter at bedside regarding [facility] choice list provided Per CMS guidelines from medicare.gov website with star ratings (copy placed in shadow chart)- per pt her spouse has [place] coming out and would like to use some agency- Noted Pulm office has contact [facility]- call made to Blanchard Mane with [facility] and confirmed they were active with spouse and will be able to accept patient as well for pleurX cath needs. Orders have been sent to [facility] by Pulm. Office.  Pt states she has no DME needs for home, daughter to provide transport home.  Bedside RN has already ordered box of 10 drainage pleurX kits for home. Per Loree Fee NP with pulm- plan will be to drain- every other day.   Spoke with Laurena Spies- pulm. Educator- she will come assist bedside RN with education for pt and daughter on pleurX cath prior to discharge.    Final next level of care: [place] Barriers to Discharge: No Barriers Identified   Patient Goals and CMS Choice Patient states their goals for this hospitalization and ongoing recovery are:: return home CMS Medicare.gov Compare Post Acute Care list provided to:: Patient Choice offered to / list presented to : Patient  Discharge Placement                       Discharge Plan and Services   Discharge Planning Services: CM Consult Post Acute Care Choice: Home Health                     HH Arranged: RN [facility] Agency: [facility] Date [place]: 01/31/20 Time HH Agency Contacted: 1500 Representative spoke with at [place]: [place] ([place]) Interventions     Readmission Risk Interventions Readmission Risk Prevention Plan 05/21/2019  Transportation Screening Complete  PCP or Specialist Appt within 3-5 Days Complete  HRI or [place] Complete  Social Work Consult for [place] Planning/Counseling Complete  Palliative Care Screening Not Applicable  Medication Review Press photographer) Complete  Some recent data might be hidden

## 2020-01-31 NOTE — Telephone Encounter (Signed)
Addressed.

## 2020-01-31 NOTE — Progress Notes (Signed)
Referral to [facility], final path pending

## 2020-01-31 NOTE — Progress Notes (Signed)
01/31/20 1138  PT Visit Information  Last PT Received On 01/31/20  Assistance Needed +1  History of Present Illness Pt is an 86 y/o female admitted secondary to L lower lobe mass. Pt is s/p videobronchoscopy. Pt is s/p PleurX catheter placement. PMH includes a fib, DM, HTN, s/p TAVR, and CHF.   Precautions  Precautions Fall  Precaution Comments pleurX  Restrictions  Weight Bearing Restrictions No  Home Living  Family/patient expects to be discharged to: Private residence  Living Arrangements Spouse/significant other  Available Help at Discharge Family;Available PRN/intermittently  Type of [place] One level  Bathroom Shower/Tub Tub/shower unit;Curtain  Gaffer - manual;Walker - 2 wheels  Additional Comments husband has dementia with CGs in AM for ADL routine M-F; Pt performs ADL/IADL independently until recently. Daughter lives close  Prior Function  Level of Independence Needs assistance  Gait / Transfers Assistance Needed Was using RW for mobility   ADL's / [place] performs ADLs with difficulty, daughter assists with iADLs  Communication  Communication No difficulties  Pain Assessment  Pain Assessment No/denies pain  Cognition  Arousal/Alertness Awake/alert  Behavior During Therapy WFL for tasks assessed/performed  Overall Cognitive Status Within Functional Limits for tasks assessed  Upper Extremity Assessment  Upper Extremity Assessment Generalized weakness  Lower Extremity Assessment  Lower Extremity Assessment Generalized weakness  Bed Mobility  General bed mobility comments In chair upon entry.   Transfers  Overall transfer level Needs assistance  Equipment used Rolling walker (2 wheeled)  Transfers Sit to/from Stand  Sit to Stand Supervision  General transfer comment Supervision for safety. Demonstrated safe hand placement.   Ambulation/Gait   Ambulation/Gait assistance Min guard;Supervision  Gait Distance (Feet) 50 Feet  Assistive device Rolling walker (2 wheeled)  Gait Pattern/deviations Step-through pattern;Decreased stride length  General Gait Details Cautious gait. Overall steady with use of RW. Pt limited secondary to fatigue. VSS.   Gait velocity Decreased  Balance  Overall balance assessment Needs assistance  Sitting-balance support No upper extremity supported;Feet supported  Sitting balance-Leahy Scale Good  Standing balance support Bilateral upper extremity supported;During functional activity  Standing balance-Leahy Scale Poor  Standing balance comment Reliant on BUE support   PT - End of Session  Equipment Utilized During Treatment Gait belt  Activity Tolerance Patient limited by fatigue  Patient left in chair;with call bell/phone within reach  Nurse Communication Mobility status  PT Assessment  PT Recommendation/Assessment Patient needs continued PT services  PT Visit Diagnosis Muscle weakness (generalized) (M62.81)  PT Problem List Decreased strength;Decreased activity tolerance;Decreased mobility  PT Plan  PT Frequency (ACUTE ONLY) Min 3X/week  PT Treatment/Interventions (ACUTE ONLY) DME instruction;Gait training;Functional mobility training;Therapeutic activities;Therapeutic exercise;Balance training;Patient/family education  AM-PAC PT "6 Clicks" Mobility Outcome Measure (Version 2)  Help needed turning from your back to your side while in a flat bed without using bedrails? 4  Help needed moving from lying on your back to sitting on the side of a flat bed without using bedrails? 3  Help needed moving to and from a bed to a chair (including a wheelchair)? 3  Help needed standing up from a chair using your arms (e.g., wheelchair or bedside chair)? 4  Help needed to walk in hospital room? 3  Help needed climbing 3-5 steps with a railing?  2  6 Click Score 19  Consider Recommendation of Discharge To: Home  with [facility]  PT Recommendation  Follow  Up Recommendations Home health PT  PT equipment None recommended by PT  Individuals Consulted  Consulted and Agree with Results and Recommendations Patient  Acute Rehab PT Goals  Patient Stated Goal to go home  PT Goal Formulation With patient  Time For Goal Achievement 02/14/20  Potential to Achieve Goals Good  PT Time Calculation  PT Start Time (ACUTE ONLY) 1136  PT Stop Time (ACUTE ONLY) 1156  PT Time Calculation (min) (ACUTE ONLY) 20 min  PT General Charges  $$ ACUTE PT VISIT 1 Visit  PT Evaluation  $PT Eval Moderate Complexity 1 Mod   Pt admitted secondary to problem above with deficits above. Pt limited this session secondary to fatigue. Educated about activity pacing techniques. Feel pt would benefit from HHPT at d/c. Will continue to follow acutely to maximize functional mobility independence and safety.   Reuel Derby, PT, DPT  Acute Rehabilitation Services  Pager: [phone] Office: [phone]
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
## 2020-01-31 NOTE — Progress Notes (Signed)
Patient education on PleurX catheter care, dressing change, and drainage.  Patient and daughter present for education. Daughter performed dressing change with my guidance then repeated the steps back to me for further clarity. While the dressing was being changed I disconnected it from the [facility] and we placed the sterile cap on the end of the PleurX. We were unable to do the drainage together but I talked the daughter through the steps and demonstrated on a simulation bottle. She was able to repeat the steps back to me.  Patient will be sent home with a PleurX discharge packet that has the steps for drainage with illustrations, a table to document the drainage, and a DVD on how to change the dressing and perform the drainage. I showed all of these items to the patient and daughter and they expressed understanding. I answered all their questions and thanked them for their time.

## 2020-01-31 NOTE — Progress Notes (Signed)
eLink Physician-Brief Progress Note Patient Name: Hailey Cobb DOB: 05-06-1932 MRN: [id number]   Date of Service  01/31/2020  HPI/Events of Note  Anxiety - Patient request home Xanax 0.5 mg.  eICU Interventions  Plan: 1. Xanax 0.5 mg PO x 1 now.      Intervention Category Major Interventions: Delirium, psychosis, severe agitation - evaluation and management  Radley Teston Eugene 01/31/2020, 2:23 AM

## 2020-01-31 NOTE — Progress Notes (Signed)
  Echocardiogram 2D Echocardiogram has been performed.  Hailey Cobb 01/31/2020, 9:19 AM

## 2020-01-31 NOTE — Procedures (Signed)
Echo attempted. Patient eating. Will attempt again later. 

## 2020-01-31 NOTE — Telephone Encounter (Signed)
Response from [facility]:  Blanchard Mane sent to [facility] we should be able to accept I just need some more information. The order was actually placed under Referral for DME, not home health, so we will need specific orders under home health with RN listed as the discipline. The patient will have a pleurex cathether? Will they be sent home from the hospital with cathether supplies, and how many kits? Does the patient have a teachable caregiver? When will the patient be discharged from the hospital?  Just let me know    In addition, [facility] is asking this as well:  Blanchard Mane sent to [facility],  One other quick question? Does the patient have any other chronic need like wound care?

## 2020-01-31 NOTE — Telephone Encounter (Signed)
Needs to be BI as provider not me.

## 2020-02-01 ENCOUNTER — Telehealth: Payer: Self-pay | Admitting: Emergency Medicine

## 2020-02-01 DIAGNOSIS — C3492 Malignant neoplasm of unspecified part of left bronchus or lung: Secondary | ICD-10-CM

## 2020-02-01 NOTE — Telephone Encounter (Signed)
I reviewed results with Patient and her daughter [name] >> squamous cell carcinoma.  She would like to be referred to the [facility], [place]. I will make the referral now. She has an OV with b mack next week to look at her pleurx

## 2020-02-02 ENCOUNTER — Inpatient Hospital Stay: Payer: Medicare Other | Admitting: Acute Care

## 2020-02-02 DIAGNOSIS — I2510 Atherosclerotic heart disease of native coronary artery without angina pectoris: Secondary | ICD-10-CM | POA: Diagnosis not present

## 2020-02-02 DIAGNOSIS — Z794 Long term (current) use of insulin: Secondary | ICD-10-CM | POA: Diagnosis not present

## 2020-02-02 DIAGNOSIS — R918 Other nonspecific abnormal finding of lung field: Secondary | ICD-10-CM | POA: Diagnosis not present

## 2020-02-02 DIAGNOSIS — Z48813 Encounter for surgical aftercare following surgery on the respiratory system: Secondary | ICD-10-CM | POA: Diagnosis not present

## 2020-02-02 DIAGNOSIS — J90 Pleural effusion, not elsewhere classified: Secondary | ICD-10-CM | POA: Diagnosis not present

## 2020-02-02 DIAGNOSIS — I4891 Unspecified atrial fibrillation: Secondary | ICD-10-CM | POA: Diagnosis not present

## 2020-02-02 DIAGNOSIS — J449 Chronic obstructive pulmonary disease, unspecified: Secondary | ICD-10-CM | POA: Diagnosis not present

## 2020-02-02 DIAGNOSIS — I10 Essential (primary) hypertension: Secondary | ICD-10-CM | POA: Diagnosis not present

## 2020-02-02 DIAGNOSIS — E119 Type 2 diabetes mellitus without complications: Secondary | ICD-10-CM | POA: Diagnosis not present

## 2020-02-02 NOTE — Op Note (Signed)
[facility] Cardiopulmonary Patient Name: Hailey Cobb Date: 01/30/2020 MRN: [id number] Attending MD: Collene Gobble , MD Date of Birth: 1931-10-12 CSN: Finalized Age: 86 Admit Type: Inpatient Gender: Female Procedure:             Bronchoscopy Indications:           Left lower lobe mass Providers:             Grace Isaac, RN, Glori Bickers, RN, Lazaro Arms,                         Technician, Theodora Blow, Technician, Collene Gobble,                         MD Referring MD:           Medicines:             General Anesthesia Complications:         No immediate complications Estimated Blood Loss:  Estimated blood loss: none. Procedure:             Pre-Anesthesia Assessment:                        - A History and Physical has been performed. Patient                         meds and allergies have been reviewed. The risks and                         benefits of the procedure and the sedation options and                         risks were discussed with the patient. All questions                         were answered and informed consent was obtained.                         Patient identification and proposed procedure were                         verified prior to the procedure by the physician in                         the pre-procedure area. Mental Status Examination:                         alert and oriented. Airway Examination: normal                         oropharyngeal airway. Respiratory Examination:                         rhonchi. CV Examination: normal. ASA Grade Assessment:                         III - A patient with severe systemic disease. After  reviewing the risks and benefits, the patient was                         deemed in satisfactory condition to undergo the                         procedure. The anesthesia plan was to use general                         anesthesia. Immediately prior to administration of                          medications, the patient was re-assessed for adequacy                         to receive sedatives. The heart rate, respiratory                         rate, oxygen saturations, blood pressure, adequacy of                         pulmonary ventilation, and response to care were                         monitored throughout the procedure. The physical                         status of the patient was re-assessed after the                         procedure.                        After obtaining informed consent, the bronchoscope was                         passed under direct vision. Throughout the procedure,                         the patient's blood pressure, pulse, and oxygen                         saturations were monitored continuously. the [id number]                         ([id number]) Olympus Therapeutic Bronchoscope was                         introduced through the mouth, via the endotracheal                         tube and advanced to the tracheobronchial tree. The                         procedure was accomplished without difficulty. The                         patient tolerated the procedure well. Scope In: Scope Out: Findings:      The trachea is of  normal caliber. The carina is sharp. The       tracheobronchial tree of the right lung was examined to at least the       first subsegmental level. Bronchial mucosa and anatomy in the right lung       are normal; there are no endobronchial lesions, and no secretions.      Left Lung Abnormalities: A completely obstructing mass was found       proximally in the lateral basal segment of the left lower lobe (B9). The       mass was small and endobronchial, exophytic and raised. The lesion was       not traversed.      Brushings of a mass were obtained in the lateral basal segment of the       left lower lobe with a cytology brush and sent for routine cytology.       Three samples were obtained.      Endobronchial  biopsies of a mass were performed in the lateral basal       segment of the left lower lobe using a forceps and sent for       histopathology examination. Five samples were obtained. Impression:            - Left lower lobe mass                        - The airway examination of the right lung was normal.                        - An endobronchial, exophytic and raised mass was                         found in the lateral basal segment of the left lower                         lobe (B9). This lesion is suspicious for malignancy.                        - Brushings were obtained of LLL mass.                        - Endobronchial biopsies were performed on LLL mass. Moderate Sedation:      Performed under general anesthesia Recommendation:        - Await biopsy and brushing results. Procedure Code(s):     --- Professional ---                        [phone], Bronchoscopy, rigid or flexible, including                         fluoroscopic guidance, when performed; with bronchial                         or endobronchial biopsy(s), single or multiple sites                        [id number], Bronchoscopy, rigid or flexible, including                         fluoroscopic guidance, when performed; with brushing  or protected brushings Diagnosis Code(s):     --- Professional ---                        R91.8, Other nonspecific abnormal finding of lung field                        J98.9, Respiratory disorder, unspecified CPT copyright 2019 American Medical Association. All rights reserved. The codes documented in this report are preliminary and upon coder review may  be revised to meet current compliance requirements. Collene Gobble, MD Collene Gobble, MD 02/02/2020 3:14:52 PM Number of Addenda: 0

## 2020-02-03 ENCOUNTER — Other Ambulatory Visit (HOSPITAL_COMMUNITY): Payer: Medicare Other

## 2020-02-05 ENCOUNTER — Ambulatory Visit: Payer: Medicare Other | Admitting: Hematology

## 2020-02-06 ENCOUNTER — Other Ambulatory Visit: Payer: Self-pay

## 2020-02-06 ENCOUNTER — Inpatient Hospital Stay: Payer: Medicare Other | Attending: Hematology | Admitting: Hematology

## 2020-02-06 VITALS — BP 125/63 | HR 85 | Temp 97.5°F | Resp 18 | Ht 64.0 in | Wt 179.3 lb

## 2020-02-06 DIAGNOSIS — D509 Iron deficiency anemia, unspecified: Secondary | ICD-10-CM

## 2020-02-06 DIAGNOSIS — R519 Headache, unspecified: Secondary | ICD-10-CM

## 2020-02-06 DIAGNOSIS — Z8544 Personal history of malignant neoplasm of other female genital organs: Secondary | ICD-10-CM | POA: Insufficient documentation

## 2020-02-06 DIAGNOSIS — D61818 Other pancytopenia: Secondary | ICD-10-CM | POA: Diagnosis present

## 2020-02-06 DIAGNOSIS — E538 Deficiency of other specified B group vitamins: Secondary | ICD-10-CM

## 2020-02-06 DIAGNOSIS — C3432 Malignant neoplasm of lower lobe, left bronchus or lung: Secondary | ICD-10-CM | POA: Insufficient documentation

## 2020-02-06 DIAGNOSIS — C3490 Malignant neoplasm of unspecified part of unspecified bronchus or lung: Secondary | ICD-10-CM

## 2020-02-06 NOTE — Progress Notes (Signed)
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 thyroid region. Total shoulder replacements noted bilaterally. IMPRESSION: No pneumothorax. Unchanged PleurX catheter position on the left inferiorly. Left base atelectasis with small left pleural effusion remain.  No new opacity. No cavitation evident. Stable cardiac prominence. Status post aortic valve replacement. Aortic Atherosclerosis (ICD10-I70.0). Electronically Signed   By: Lowella Grip III M.D.   On: 01/31/2020 08:22   DG Chest Port 1 View  Result Date: 01/30/2020 CLINICAL DATA:  Status post bronchoscopy. EXAM: PORTABLE CHEST 1 VIEW COMPARISON:  January 24, 2020. FINDINGS: Stable cardiomediastinal silhouette. Status post transcatheter aortic valve repair. Left pleural drainage catheter is noted. No pneumothorax or pleural effusion is noted. Right lung is clear. Minimal left basilar subsegmental atelectasis is noted. Status post bilateral shoulder arthroplasties. Atherosclerosis of thoracic aorta is noted. IMPRESSION: Left basilar pleural drainage catheter is noted. Minimal left basilar subsegmental atelectasis. No pneumothorax is noted. Aortic Atherosclerosis (ICD10-I70.0).  Electronically Signed   By: Marijo Conception M.D.   On: 01/30/2020 13:29   DG Chest Port 1 View  Result Date: 01/19/2020 CLINICAL DATA:  86 year old female with history of left pleural effusion. EXAM: PORTABLE CHEST 1 VIEW COMPARISON:  Chest x-ray 01/18/2020. FINDINGS: Lung volumes are slightly low. Opacity in the left base which may reflect atelectasis and/or consolidation. Moderate left pleural effusion slightly increased compared to the prior study. Right lung is clear. No right pleural effusion. No pneumothorax. No evidence of pulmonary edema. Heart size is mildly enlarged. Upper mediastinal contours are within normal limits. Aortic atherosclerosis. Status post TAVR. Status post bilateral shoulder arthroplasty. IMPRESSION: 1. Persistent and increasing moderate left pleural effusion with left basilar atelectasis and/or consolidation. 2. Aortic atherosclerosis. Electronically Signed   By: Vinnie Langton M.D.   On: 01/19/2020 08:07   DG Chest Port 1 View  Result Date: 01/18/2020 CLINICAL DATA:  Status post thoracentesis. EXAM: PORTABLE CHEST 1 VIEW COMPARISON:  Prior same day chest radiograph. FINDINGS: Decreased left pleural effusion now small in size. Left basilar/retrocardiac confluent opacities are also decreased. Background pulmonary edema with diffuse interstitial prominence and perihilar vessel engorgement. No pneumothorax. Partially obscured cardiomediastinal silhouette. Sequela of TAVR. Bilateral humeral prostheses. IMPRESSION: Small left pleural effusion and left basilar/retrocardiac opacities, decreased from prior exam. Electronically Signed   By: Primitivo Gauze M.D.   On: 01/18/2020 16:26   DG Chest Port 1 View  Result Date: 01/18/2020 CLINICAL DATA:  Hypoxemia. EXAM: PORTABLE CHEST 1 VIEW COMPARISON:  12/27/2019 FINDINGS: The patient has developed a large left pleural effusion since the prior study 12/27/2019. Overall heart size and pulmonary vascularity are normal. Right lung is clear.  No acute bone abnormality. TAVR. Multiple surgical clips in the region of the thyroid consistent previous reported thyroidectomy. Bilateral proximal humeral prostheses, unchanged. IMPRESSION: New large left pleural effusion. Electronically Signed   By: Lorriane Shire M.D.   On: 01/18/2020 11:24   ECHOCARDIOGRAM COMPLETE  Result Date: 01/31/2020    ECHOCARDIOGRAM REPORT   Patient Name:   Hailey Cobb Date of Exam: 01/31/2020 Medical Rec #:  [id number]      Height:       64.0 in Accession #:    [id number]     Weight:       181.0 lb Date of Birth:  1931-11-20       BSA:          1.875 m Patient Age:    31 years       BP:           122/51 mmHg Patient Gender: F              HR:           55 bpm.  Exam Location:  Inpatient Procedure: 2D Echo, Cardiac Doppler and Color Doppler Indications:    Atrial fibrillation  History:        Patient has prior history of Echocardiogram examinations, most                 recent 06/08/2019. Aortic Valve Disease; Risk Factors:Diabetes                 and Hypertension. S/p TAVR. 26 mm Edwards-Sapien valve.  Sonographer:    Clayton Lefort RDCS (AE) Referring Phys: [id number] [place]  1. Left ventricular ejection fraction, by estimation, is 60 to 65%. The left ventricle has normal function. The left ventricle has no regional wall motion abnormalities. There is moderate left ventricular hypertrophy of the basal-septal segment. Left ventricular diastolic parameters are consistent with Grade II diastolic dysfunction (pseudonormalization). Elevated left ventricular end-diastolic pressure.  2. Right ventricular systolic function is normal. The right ventricular size is normal. There is moderately elevated pulmonary artery systolic pressure. The estimated right ventricular systolic pressure is 62.7 mmHg.  3. Left atrial size was severely dilated.  4. The mitral valve is degenerative. Mild mitral valve regurgitation. No evidence of mitral stenosis.  5. The aortic valve has been  repaired/replaced. There is a 26 mm Edwards Sapien prosthetic, stented (TAVR) valve present in the aortic position. Aortic valve regurgitation is not visualized. No aortic stenosis is present.  6. The inferior vena cava is normal in size with greater than 50% respiratory variability, suggesting right atrial pressure of 3 mmHgCompared to echo 05/2019, normal rfunctioning TAVR with mean AVG of TAVR 5mmHg which is essentially unchanged from prior echo. PASP has increased from 43mmHg to 8mmHg. FINDINGS  Left Ventricle: Left ventricular ejection fraction, by estimation, is 60 to 65%. The left ventricle has normal function. The left ventricle has no regional wall motion abnormalities. The left ventricular internal cavity size was normal in size. There is  moderate left ventricular hypertrophy of the basal-septal segment. Left ventricular diastolic parameters are consistent with Grade II diastolic dysfunction (pseudonormalization). Elevated left ventricular end-diastolic pressure. Right Ventricle: The right ventricular size is normal. No increase in right ventricular wall thickness. Right ventricular systolic function is normal. There is moderately elevated pulmonary artery systolic pressure. The tricuspid regurgitant velocity is 3.47 m/s, and with an assumed right atrial pressure of 8 mmHg, the estimated right ventricular systolic pressure is 03.5 mmHg. Left Atrium: Left atrial size was severely dilated. Right Atrium: Right atrial size was normal in size. Pericardium: There is no evidence of pericardial effusion. Mitral Valve: The mitral valve is degenerative in appearance. There is mild thickening of the mitral valve leaflet(s). There is moderate calcification of the mitral valve leaflet(s). Normal mobility of the mitral valve leaflets. Mild to moderate mitral annular calcification. Mild mitral valve regurgitation. No evidence of mitral valve stenosis. MV peak gradient, 6.6 mmHg. The mean mitral valve gradient is 2.0  mmHg. Tricuspid Valve: The tricuspid valve is normal in structure. Tricuspid valve regurgitation is mild . No evidence of tricuspid stenosis. Aortic Valve: The aortic valve has been repaired/replaced. Aortic valve regurgitation is not visualized. Aortic valve mean gradient measures 11.4 mmHg. Aortic valve peak gradient measures 25.0 mmHg. Aortic valve area, by VTI measures 2.55 cm. There is a  26 mm Edwards Sapien prosthetic, stented (TAVR) valve present in the aortic position. Pulmonic Valve: The pulmonic valve was normal in structure. Pulmonic valve regurgitation is not visualized. No evidence of pulmonic stenosis. Aorta: The aortic root  is normal in size and structure. Venous: The inferior vena cava is normal in size with less than 50% respiratory variability, suggesting right atrial pressure of 8 mmHg. IAS/Shunts: The interatrial septum was not well visualized. Cannot rule out PFO. Consider Agitated saline contrast bubble study.  LEFT VENTRICLE PLAX 2D LVIDd:         3.90 cm  Diastology LVIDs:         2.80 cm  LV e' lateral:   4.90 cm/s LV PW:         1.80 cm  LV E/e' lateral: 26.9 LV IVS:        1.60 cm  LV e' medial:    5.98 cm/s LVOT diam:     2.60 cm  LV E/e' medial:  22.1 LV SV:         146 LV SV Index:   78 LVOT Area:     5.31 cm  RIGHT VENTRICLE             IVC RV Basal diam:  3.00 cm     IVC diam: 2.00 cm RV S prime:     12.70 cm/s TAPSE (M-mode): 2.8 cm LEFT ATRIUM              Index       RIGHT ATRIUM           Index LA diam:        4.20 cm  2.24 cm/m  RA Area:     14.80 cm LA Vol (A2C):   104.0 ml 55.46 ml/m RA Volume:   35.70 ml  19.04 ml/m LA Vol (A4C):   88.5 ml  47.20 ml/m LA Biplane Vol: 99.0 ml  52.80 ml/m  AORTIC VALVE AV Area (Vmax):    2.34 cm AV Area (Vmean):   2.36 cm AV Area (VTI):     2.55 cm AV Vmax:           250.00 cm/s AV Vmean:          155.600 cm/s AV VTI:            0.571 m AV Peak Grad:      25.0 mmHg AV Mean Grad:      11.4 mmHg LVOT Vmax:         110.20 cm/s LVOT  Vmean:        69.220 cm/s LVOT VTI:          0.274 m LVOT/AV VTI ratio: 0.48 MITRAL VALVE                TRICUSPID VALVE MV Area (PHT): 2.24 cm     TR Peak grad:   48.2 mmHg MV Peak grad:  6.6 mmHg     TR Vmax:        347.00 cm/s MV Mean grad:  2.0 mmHg MV Vmax:       1.28 m/s     SHUNTS MV Vmean:      66.7 cm/s    Systemic VTI:  0.27 m MV Decel Time: 338 msec     Systemic Diam: 2.60 cm MV E velocity: 132.00 cm/s MV A velocity: 111.00 cm/s MV E/A ratio:  1.19 Fransico Him MD Electronically signed by Fransico Him MD Signature Date/Time: 01/31/2020/12:54:21 PM    Final     ASSESSMENT & PLAN:   86 yo with   1) Pancytopenia - unclear etiology Mild normocytic anemia MCV 92 Mild thrombocytopenia 110l Mild leucopenia WNC 3k  2) 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 PLAN: -Discussed pt labwork today, 02/06/20; all values are WNL except for RBC at 2.90, Hgb at 8.1, HCT at 26.1, Sodium at 132, Potassium at 5.2, Glucose at 191, Calcium at 8.7. -Discussed 01/09/2020 PET/CT ([id number]) which revealed "1. Intense FDG uptake is associated with the left lower lobe cavitary lung mass. SUV max is equal to 13.4. Primary differential consideration includes necrotic tumor versus necrotizing pneumonia. Evidence of progressive postobstructive pneumonitis within the lingula and left lower lobe noted. The small left pleural effusion appears increased in volume from previous exam. 2. No FDG avid mediastinal or hilar lymph nodes or evidence of distant metastatic disease 3. Aortic atherosclerosis and coronary artery calcifications. Aortic Atherosclerosis (ICD10-I70.0). 4.  Emphysema (ICD10-J43.9)." -Discussed 01/18/2020 Non-Gynecological Report ([id number]) which revealed "Atypical cells present." -Discussed 01/24/2020 Non-Gynecological Cytology Report ([id number]) which revealed "Reactive mesothelial cells present." -Discussed 01/30/2020  Non-Gynecological Cytology Report ([id number]) which revealed "Malignant cells consistent with squamous cell carcinoma." -Discussed 01/30/2020 Surgical Pathology Report [id number]) which revealed "Invasive moderately differentiated squamous cell carcinoma." -Advised pt that she has a stage 3-4 Squamous Cell Carcinoma with moderate aggressiveness.  -Advised pt that this is not a curable disease and that we will be treating in a palliative setting.  -Advised pt that one fluid sample had cells that were suspicious for malignancy, but none of the samples could confirm this. - sympathetic effusion vs malignancy?  -Advised pt that we would like to confirm if the fluid is a part of the malignancy to differentiate between stage 3-4 disease.  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 It appears to be a lung primary.  -Because the lung mass appears to be gaining size vs metastasizing would recommend immediate local Radiation Therapy. -Advised pt that there are immunotherapy and targeted therapy options based on genetics, but would focus on systemic therapies after RT. -Advised pt that we would try to avoid chemotherapy at this time due to pt's current functional status, anemia, and age. -Recommend pt lower dosage of Metformin to 500 mg twice per day  -Will begin monthly Vitamin B12 injections -Will give IV Injectafer weekly x2  -Will get CT Head in 3-4 days to complete staging  -Will urgently refer pt to Radiation Oncology  -Will see back in 2 weeks    FOLLOW UP: -Urgent referral to radiation oncology for palliative RT for newly diagnosed LLL squamous cell lung carcinoma -CT head in 3-4 days -Plz schedule B12 [place] q4weeks x 6 -PLz schedule IV Injectafer weekly x 2 doses -RTC with Dr Irene Limbo in 2 weeks   The total time spent in the appt was 60 minutes and more than 50% was on counseling and direct patient cares.  All of the patient's questions were answered with  apparent satisfaction. The patient knows to call the clinic with any problems, questions or concerns.    Sullivan Lone MD [place] AAHIVMS [facility] Hematology/Oncology Physician [facility]  (Office):       [phone] (Work cell):  [phone] (Fax):           [phone]  02/06/2020 5:02 PM  I, Yevette Edwards, am acting as a scribe for Dr. Sullivan Lone.   .I have reviewed the above documentation for accuracy and completeness, and I agree with the above. Brunetta Genera MD

## 2020-02-06 NOTE — Patient Instructions (Signed)
Thank you for choosing [facility] to provide your oncology and hematology care.   Should you have questions after your visit to the [facility] ([facility]), please contact this office at [phone] between 8:30 AM and 4:30 PM.  Voice mails left after 4:00 PM may not be returned until the following business day.  Calls received after 4:30 PM will be answered by an off-site Nurse Triage Line.    Prescription Refills:  Please have your pharmacy contact us directly for most prescription requests.  Contact the office directly for refills of narcotics (pain medications). Allow 48-72 hours for refills.  Appointments: Please contact the [facility] scheduling department [phone] for questions regarding [facility] appointment scheduling.  Contact the schedulers with any scheduling changes so that your appointment can be rescheduled in a timely manner.   Central Scheduling for [place] [phone] - Call to schedule procedures such as PET scans, CT scans, MRI, Ultrasound, etc.  To afford each patient quality time with our providers, please arrive 30 minutes before your scheduled appointment time.  If you arrive late for your appointment, you may be asked to reschedule.  We strive to give you quality time with our providers, and arriving late affects you and other patients whose appointments are after yours. If you are a no show for multiple scheduled visits, you may be dismissed from the clinic at the providers discretion.     Resources: [facility] Social Workers [phone] for additional information on assistance programs or assistance connecting with community support programs   [place] DSS  336-641-3447: Information regarding food stamps, Medicaid, and utility assistance [place] [phone]   [place] [employer]'s shared-ride transportation service for eligible riders who have a disability that prevents them from riding the fixed route bus.   Medicare Rights Center  800-333-4114 Helps people with Medicare understand their rights and benefits, navigate the Medicare system, and secure the quality healthcare they deserve [employer] 800-227-2345 Assists patients locate various types of support and financial assistance Cancer Care: 1-800-813-HOPE (4673) Provides financial assistance, online support groups, medication/co-pay assistance.   Transportation Assistance for appointments at [facility]: Transportation Coordinator [phone]  Again, thank you for choosing [facility] for your care.       

## 2020-02-07 ENCOUNTER — Encounter: Payer: Self-pay | Admitting: *Deleted

## 2020-02-07 DIAGNOSIS — E119 Type 2 diabetes mellitus without complications: Secondary | ICD-10-CM | POA: Diagnosis not present

## 2020-02-07 DIAGNOSIS — J90 Pleural effusion, not elsewhere classified: Secondary | ICD-10-CM | POA: Diagnosis not present

## 2020-02-07 DIAGNOSIS — I4891 Unspecified atrial fibrillation: Secondary | ICD-10-CM | POA: Diagnosis not present

## 2020-02-07 DIAGNOSIS — R918 Other nonspecific abnormal finding of lung field: Secondary | ICD-10-CM | POA: Diagnosis not present

## 2020-02-07 DIAGNOSIS — Z48813 Encounter for surgical aftercare following surgery on the respiratory system: Secondary | ICD-10-CM | POA: Diagnosis not present

## 2020-02-07 DIAGNOSIS — J449 Chronic obstructive pulmonary disease, unspecified: Secondary | ICD-10-CM | POA: Diagnosis not present

## 2020-02-07 NOTE — Progress Notes (Signed)
Ms. Marseille is a newly dx lung cancer patient of Dr. Irene Limbo.  He has ordered an urgent referral to [facility].  I followed up with Rad Onc scheduling team to update them on the urgent referral.

## 2020-02-07 NOTE — Progress Notes (Signed)
Thoracic Location of Tumor / Histology:  Squamous cell carcinoma of LEFT lower lobe  Patient was seen on 01/03/2020 for a persistent cough. CT scan on 12/29/2019 revealed a new left lower lobe mass 6.9 x 6.3 cm and a small left pleural effusion.  Subsequent PET scan on 01/09/2020 showed the mass to be hypermetabolic  Biopsies revealed:  01/30/2020 FINAL MICROSCOPIC DIAGNOSIS:  A. LUNG, LEFT LOWER LOBE, BIOPSY:  - Invasive moderately differentiated squamous cell carcinoma. See  comment   Tobacco/Marijuana/Snuff/ETOH use: Patient does not drink alcohol and has never smoker tobacco/marijuana.   Past/Anticipated interventions by cardiothoracic surgery, if any:  01/30/2020 -Dr. Baltazar Apo VIDEO BRONCHOSCOPY WITH ENDOBRONCHIAL ULTRASOUND  -Dr. Leory Plowman Icard PleurX catheter insertion on the left 01/24/2020 Dr. Leory Plowman Icard Thoracentesis   Past/Anticipated interventions by medical oncology, if any:  Under care of Dr. Sullivan Lone 02/06/2020 -Advised pt that she has a stage 3-4 Squamous Cell Carcinoma with moderate aggressiveness. -Advised pt that this is not a curable disease and that we will be treating in a palliative setting -Advised pt that we would like to confirm if the fluid is a part of the malignancy to differentiate between stage 3-4 disease.  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 It appears to be a lung primary.  -Because the lung mass appears to be gaining size vs metastasizing would recommend immediate local Radiation Therapy. -Advised pt that there are immunotherapy and targeted therapy options based on genetics, but would focus on systemic therapies after RT. -Advised pt that we would try to avoid chemotherapy at this time due to pt's current functional status, anemia, and age.  Signs/Symptoms  Weight changes, if any: No  Respiratory complaints, if any: Shortness of breath with minimal activity and wheezing. Persistent/productive  cough since this past March  Hemoptysis, if any: None  Pain issues, if any:  Chronic right hip and lower back   SAFETY ISSUES:  Prior radiation? Yes: Pelvis (for vulvar squamous cell carcinoma) 04/05/2019-06/01/2019 28 fx total of 50.4 Gy with 7 boost treatments total of 14 Gy by Dr. Gery Pray  Pacemaker/ICD? No   Possible current pregnancy? No  Is the patient on methotrexate? No  Current Complaints / other details:  Still has a PleurX drain in place. She states it feels like it is up against a nerve and causes her right shoulder quite a bit of pain

## 2020-02-08 ENCOUNTER — Other Ambulatory Visit: Payer: Self-pay | Admitting: *Deleted

## 2020-02-08 ENCOUNTER — Telehealth: Payer: Self-pay | Admitting: Hematology

## 2020-02-08 ENCOUNTER — Encounter: Payer: Self-pay | Admitting: Radiation Oncology

## 2020-02-08 NOTE — Telephone Encounter (Signed)
Scheduled appt per 6/23 sch message- pt daughter wanted to schedule with 6/25 appts .

## 2020-02-08 NOTE — Progress Notes (Signed)
The proposed treatment discussed in cancer conference 02/08/20 is for discussion purpose only and is not only a binding recommendation.  The patient have not been physically examined nor present for their treatment options.  Therefore, final treatment plans cannot be decided.

## 2020-02-08 NOTE — Progress Notes (Signed)
The patient's case was discussed this a.m. in thoracic multidisciplinary conference.  The patient appears to be a reasonable candidate for SBRT/hypofractionated radiation treatment alone as an initial treatment.  The patient has a suspicious pleural effusion without confirmation of malignant involvement at this time, but this does remain a possibility.  The patient may be a candidate for additional systemic treatment in the future based on additional results.  The patient is scheduled to be seen in our department later this week and will undergo evaluation and more detailed discussion of treatment options.  ------------------------------------------------  Jodelle Gross, MD, PhD

## 2020-02-09 ENCOUNTER — Ambulatory Visit
Admission: RE | Admit: 2020-02-09 | Discharge: 2020-02-09 | Disposition: A | Discharge status: Home / Self Care | Payer: Medicare Other | Source: Ambulatory Visit | Attending: Radiation Oncology | Admitting: Radiation Oncology

## 2020-02-09 ENCOUNTER — Inpatient Hospital Stay: Payer: Medicare Other

## 2020-02-09 ENCOUNTER — Encounter: Payer: Self-pay | Admitting: Pulmonary Disease

## 2020-02-09 ENCOUNTER — Other Ambulatory Visit: Payer: Self-pay

## 2020-02-09 ENCOUNTER — Ambulatory Visit (INDEPENDENT_AMBULATORY_CARE_PROVIDER_SITE_OTHER): Payer: Medicare Other | Admitting: Pulmonary Disease

## 2020-02-09 ENCOUNTER — Encounter: Payer: Self-pay | Admitting: Radiation Oncology

## 2020-02-09 ENCOUNTER — Other Ambulatory Visit: Payer: Self-pay | Admitting: *Deleted

## 2020-02-09 VITALS — BP 128/82 | HR 68 | Temp 97.9°F | Ht 64.0 in | Wt 179.2 lb

## 2020-02-09 VITALS — BP 139/58 | HR 60 | Temp 97.5°F | Resp 18 | Ht 64.0 in | Wt 178.5 lb

## 2020-02-09 DIAGNOSIS — I2510 Atherosclerotic heart disease of native coronary artery without angina pectoris: Secondary | ICD-10-CM | POA: Diagnosis not present

## 2020-02-09 DIAGNOSIS — Z87442 Personal history of urinary calculi: Secondary | ICD-10-CM | POA: Diagnosis not present

## 2020-02-09 DIAGNOSIS — Z8544 Personal history of malignant neoplasm of other female genital organs: Secondary | ICD-10-CM | POA: Diagnosis not present

## 2020-02-09 DIAGNOSIS — E7800 Pure hypercholesterolemia, unspecified: Secondary | ICD-10-CM | POA: Insufficient documentation

## 2020-02-09 DIAGNOSIS — Z8674 Personal history of sudden cardiac arrest: Secondary | ICD-10-CM | POA: Insufficient documentation

## 2020-02-09 DIAGNOSIS — F419 Anxiety disorder, unspecified: Secondary | ICD-10-CM | POA: Diagnosis not present

## 2020-02-09 DIAGNOSIS — M25551 Pain in right hip: Secondary | ICD-10-CM | POA: Diagnosis not present

## 2020-02-09 DIAGNOSIS — G8929 Other chronic pain: Secondary | ICD-10-CM | POA: Insufficient documentation

## 2020-02-09 DIAGNOSIS — J90 Pleural effusion, not elsewhere classified: Secondary | ICD-10-CM | POA: Diagnosis not present

## 2020-02-09 DIAGNOSIS — I5032 Chronic diastolic (congestive) heart failure: Secondary | ICD-10-CM | POA: Insufficient documentation

## 2020-02-09 DIAGNOSIS — C3490 Malignant neoplasm of unspecified part of unspecified bronchus or lung: Secondary | ICD-10-CM

## 2020-02-09 DIAGNOSIS — Z79899 Other long term (current) drug therapy: Secondary | ICD-10-CM | POA: Insufficient documentation

## 2020-02-09 DIAGNOSIS — I700 Atherosclerosis of aorta: Secondary | ICD-10-CM | POA: Diagnosis not present

## 2020-02-09 DIAGNOSIS — D61818 Other pancytopenia: Secondary | ICD-10-CM | POA: Diagnosis not present

## 2020-02-09 DIAGNOSIS — C519 Malignant neoplasm of vulva, unspecified: Secondary | ICD-10-CM

## 2020-02-09 DIAGNOSIS — K589 Irritable bowel syndrome without diarrhea: Secondary | ICD-10-CM | POA: Diagnosis not present

## 2020-02-09 DIAGNOSIS — M545 Low back pain: Secondary | ICD-10-CM | POA: Diagnosis not present

## 2020-02-09 DIAGNOSIS — C3432 Malignant neoplasm of lower lobe, left bronchus or lung: Secondary | ICD-10-CM | POA: Diagnosis not present

## 2020-02-09 DIAGNOSIS — M1990 Unspecified osteoarthritis, unspecified site: Secondary | ICD-10-CM | POA: Diagnosis not present

## 2020-02-09 DIAGNOSIS — D649 Anemia, unspecified: Secondary | ICD-10-CM | POA: Insufficient documentation

## 2020-02-09 DIAGNOSIS — R062 Wheezing: Secondary | ICD-10-CM | POA: Diagnosis not present

## 2020-02-09 DIAGNOSIS — Z952 Presence of prosthetic heart valve: Secondary | ICD-10-CM | POA: Diagnosis not present

## 2020-02-09 DIAGNOSIS — E039 Hypothyroidism, unspecified: Secondary | ICD-10-CM | POA: Diagnosis not present

## 2020-02-09 DIAGNOSIS — K219 Gastro-esophageal reflux disease without esophagitis: Secondary | ICD-10-CM | POA: Insufficient documentation

## 2020-02-09 DIAGNOSIS — T8579XA Infection and inflammatory reaction due to other internal prosthetic devices, implants and grafts, initial encounter: Secondary | ICD-10-CM | POA: Insufficient documentation

## 2020-02-09 DIAGNOSIS — Z7982 Long term (current) use of aspirin: Secondary | ICD-10-CM | POA: Insufficient documentation

## 2020-02-09 DIAGNOSIS — Z800 Family history of malignant neoplasm of digestive organs: Secondary | ICD-10-CM | POA: Insufficient documentation

## 2020-02-09 DIAGNOSIS — J984 Other disorders of lung: Secondary | ICD-10-CM | POA: Diagnosis not present

## 2020-02-09 DIAGNOSIS — I110 Hypertensive heart disease with heart failure: Secondary | ICD-10-CM | POA: Insufficient documentation

## 2020-02-09 LAB — CMP (CANCER CENTER ONLY)
ALT: 26 U/L (ref 0–44)
AST: 19 U/L (ref 15–41)
Albumin: 2.8 g/dL — ABNORMAL LOW (ref 3.5–5.0)
Alkaline Phosphatase: 98 U/L (ref 38–126)
Anion gap: 11 (ref 5–15)
BUN: 11 mg/dL (ref 8–23)
CO2: 28 mmol/L (ref 22–32)
Calcium: 9.9 mg/dL (ref 8.9–10.3)
Chloride: 98 mmol/L (ref 98–111)
Creatinine: 0.75 mg/dL (ref 0.44–1.00)
GFR, Est AFR Am: >60 mL/min (ref >60)
GFR, Estimated: >60 mL/min (ref >60)
Glucose, Bld: 124 mg/dL — ABNORMAL HIGH (ref 70–99)
Potassium: 5.4 mmol/L — ABNORMAL HIGH (ref 3.5–5.1)
Sodium: 137 mmol/L (ref 135–145)
Total Bilirubin: 0.3 mg/dL (ref 0.3–1.2)
Total Protein: 6.9 g/dL (ref 6.5–8.1)

## 2020-02-09 MED ORDER — CEPHALEXIN 500 MG PO CAPS: 500.0000 mg | ORAL_CAPSULE | Freq: Four times a day (QID) | ORAL | 0 refills | Status: AC

## 2020-02-09 NOTE — Patient Instructions (Addendum)
You were seen today by Lauraine Rinne, NP  for:   1. Recurrent left pleural effusion  Continue draining your Pleurx every other day  Notify our office if you start having difficulty with drainage or if you are running low on supplies and not able to obtain more  Please also notify our office if your drawer Pleurx is draining less than 100 consecutively 2 times  2. Cavitating mass in left lower lung lobe  Continue forward with oncology  Continue forward with radiation oncology  Follow Up:    Return in about 4 weeks (around 03/08/2020), or if symptoms worsen or fail to improve, for Follow up with Dr. Valeta Harms, Follow up with Wyn Quaker FNP-C.   Please do your part to reduce the spread of COVID-19:      Reduce your risk of any infection  and COVID19 by using the similar precautions used for avoiding the common cold or flu:  Marland Kitchen Wash your hands often with soap and warm water for at least 20 seconds.  If soap and water are not readily available, use an alcohol-based hand sanitizer with at least 60% alcohol.  . If coughing or sneezing, cover your mouth and nose by coughing or sneezing into the elbow areas of your shirt or coat, into a tissue or into your sleeve (not your hands). Langley Gauss A MASK when in public  . Avoid shaking hands with others and consider head nods or verbal greetings only. . Avoid touching your eyes, nose, or mouth with unwashed hands.  . Avoid close contact with people who are sick. . Avoid places or events with large numbers of people in one location, like concerts or sporting events. . If you have some symptoms but not all symptoms, continue to monitor at home and seek medical attention if your symptoms worsen. . If you are having a medical emergency, call 911.   [place] / e-Visit: [email or web address]         [facility]: [facility]:  [phone]                   [facility]: [phone]     It is flu season:   >>> Best ways to protect herself from the flu: Receive the yearly flu vaccine, practice good hand hygiene washing with soap and also using hand sanitizer when available, eat a nutritious meals, get adequate rest, hydrate appropriately   Please contact the office if your symptoms worsen or you have concerns that you are not improving.   Thank you for choosing [facility] for your healthcare, and for allowing [place] to partner with you on your healthcare journey. I am thankful to be able to provide care to you today.   Wyn Quaker FNP-C

## 2020-02-09 NOTE — Assessment & Plan Note (Signed)
Plan: Dressing change today Keflex prescribed 4-week follow-up

## 2020-02-09 NOTE — Progress Notes (Signed)
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 tablet Take 4-8 mg by mouth See admin instructions. Take 4 mg by mouth in the morning and 8 mg at bedtime  .  furosemide (LASIX) 40 MG tablet Take 0.5-1 tablets (20-40 mg total) by mouth See admin instructions. Take 20 mg and 40 mg alternate every other day  . Guaifenesin (MUCINEX MAXIMUM STRENGTH) 1200 MG TB12 Take 1,200 mg by mouth in the morning and at bedtime.  Marland Kitchen HYDROcodone-acetaminophen (NORCO/VICODIN) 5-325 MG tablet Take 1-2 tablets by mouth every 4 (four) hours as needed for severe pain. Do not take and drive  . HYDROcodone-homatropine (HYCODAN) 5-1.5 MG/5ML syrup Take 5 mLs by mouth every 6 (six) hours as needed for cough.  . irbesartan (AVAPRO) 150 MG tablet Take 150 mg by mouth 2 (two) times daily.  . iron polysaccharides (NIFEREX) 150 MG capsule Take 1 capsule (150 mg total) by mouth daily.  Marland Kitchen levETIRAcetam (KEPPRA) 500 MG tablet Take 500 mg by mouth daily.  Marland Kitchen levothyroxine (SYNTHROID) 125 MCG tablet Take 125 mcg by mouth daily before breakfast.  . metFORMIN (GLUCOPHAGE) 1000 MG tablet Take 500 mg by mouth 2 (two) times daily with a meal. 500 mg in AM and 500 mg in PM  . Multiple Vitamins-Minerals (PRESERVISION AREDS PO) Take 1 tablet by mouth daily.  Marland Kitchen OVER THE COUNTER MEDICATION Take 3 tablets by  mouth daily. Thymic factor vitamins  . polyvinyl alcohol (LIQUIFILM TEARS) 1.4 % ophthalmic solution Place 1 drop into both eyes daily.   . rosuvastatin (CRESTOR) 20 MG tablet Take 20 mg by mouth every other day.   . sitaGLIPtin (JANUVIA) 100 MG tablet Take 100 mg by mouth daily.   . cephALEXin (KEFLEX) 500 MG capsule Take 1 capsule (500 mg total) by mouth 4 (four) times daily for 5 days.   Facility-Administered Encounter Medications as of 02/09/2020  Medication  . lactated ringers infusion     Review of Systems  Review of Systems  Constitutional: Positive for fatigue. Negative for activity change and fever.  HENT: Negative for sinus pressure, sinus pain and sore throat.   Respiratory: Positive for cough and shortness of breath. Negative for wheezing.   Cardiovascular: Negative for chest pain and palpitations.  Gastrointestinal: Negative for diarrhea, nausea and vomiting.  Musculoskeletal: Negative for arthralgias.  Neurological: Negative for dizziness.  Psychiatric/Behavioral: Negative for sleep disturbance. The patient is not nervous/anxious.      Physical Exam  BP 128/82 (BP Location: Left Arm, Cuff Size: Normal)   Pulse 68   Temp 97.9 F (36.6 C) (Oral)   Ht 5\' 4"  (1.626 m)   Wt 179 lb 3.2 oz (81.3 kg)   LMP  (LMP Unknown)   SpO2 94%   BMI 30.76 kg/m   Wt Readings from Last 5 Encounters:  02/09/20 179 lb 3.2 oz (81.3 kg)  02/09/20 178 lb 8 oz (81 kg)  02/06/20 179 lb 4.8 oz (81.3 kg)  01/30/20 181 lb (82.1 kg)  01/24/20 184 lb 12.8 oz (83.8 kg)    BMI Readings from Last 5 Encounters:  02/09/20 30.76 kg/m  02/09/20 30.64 kg/m  02/06/20 30.78 kg/m  01/30/20 31.07 kg/m  01/24/20 31.72 kg/m     Physical Exam Vitals and nursing note reviewed.  Constitutional:      General: She is not in acute distress.    Appearance: Normal appearance. She is obese.     Comments: Fatigued elderly female  HENT:     Head: Normocephalic and atraumatic.     Right Ear:  Tympanic membrane, ear canal and external ear normal. There is no impacted cerumen.     Left Ear: Tympanic membrane, ear canal and external ear normal.  There is no impacted cerumen.     Nose: Nose normal. No congestion.     Mouth/Throat:     Mouth: Mucous membranes are moist.     Pharynx: Oropharynx is clear.  Eyes:     Pupils: Pupils are equal, round, and reactive to light.  Cardiovascular:     Rate and Rhythm: Normal rate and regular rhythm.     Pulses: Normal pulses.     Heart sounds: Normal heart sounds. No murmur heard.   Pulmonary:     Effort: Pulmonary effort is normal. No respiratory distress.     Breath sounds: No decreased air movement. Examination of the left-lower field reveals decreased breath sounds. Decreased breath sounds present. No wheezing or rales.  Chest:    Musculoskeletal:     Cervical back: Normal range of motion.  Skin:    General: Skin is warm and dry.     Capillary Refill: Capillary refill takes less than 2 seconds.     Findings: Erythema present.       Neurological:     General: No focal deficit present.     Mental Status: She is alert and oriented to person, place, and time. Mental status is at baseline.     Gait: Gait normal.  Psychiatric:        Mood and Affect: Mood normal.        Behavior: Behavior normal.        Thought Content: Thought content normal.        Judgment: Judgment normal.       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 Valeta Harms, Follow up with Wyn Quaker FNP-C.   Lauraine Rinne, NP 02/09/2020   This appointment required 45 minutes of patient care (this includes precharting, chart review, review of results, face-to-face care, etc.).

## 2020-02-09 NOTE — Assessment & Plan Note (Signed)
Status post 2 thoracentesis 01/30/2020 Pleurx drain placement 350 cc drained from Pleurx today Slight redness at Pleurx site Sutures removed Slight exudative drainage from Pleurx site  Plan: 4-week follow-up Keflex today Dressing change

## 2020-02-09 NOTE — Assessment & Plan Note (Signed)
Plan: Continue follow-up with radiation oncology Continue follow-up with oncology Follow-up with our office in 4 weeks

## 2020-02-12 ENCOUNTER — Ambulatory Visit (HOSPITAL_COMMUNITY)
Admission: RE | Admit: 2020-02-12 | Discharge: 2020-02-12 | Disposition: A | Discharge status: Home / Self Care | Payer: Medicare Other | Source: Ambulatory Visit | Attending: Hematology | Admitting: Hematology

## 2020-02-12 ENCOUNTER — Other Ambulatory Visit: Payer: Self-pay

## 2020-02-12 ENCOUNTER — Telehealth: Payer: Self-pay

## 2020-02-12 DIAGNOSIS — R519 Headache, unspecified: Secondary | ICD-10-CM | POA: Diagnosis present

## 2020-02-12 DIAGNOSIS — C3490 Malignant neoplasm of unspecified part of unspecified bronchus or lung: Secondary | ICD-10-CM | POA: Diagnosis not present

## 2020-02-12 DIAGNOSIS — R9082 White matter disease, unspecified: Secondary | ICD-10-CM | POA: Diagnosis not present

## 2020-02-12 MED ORDER — IOHEXOL 300 MG/ML  SOLN
75.0000 mL | Freq: Once | INTRAMUSCULAR | Status: AC | PRN
  Administered 2020-02-12: 75 mL via INTRAVENOUS

## 2020-02-12 MED ORDER — SODIUM CHLORIDE (PF) 0.9 % IJ SOLN
INTRAMUSCULAR | Status: AC
  Filled 2020-02-12: qty 50

## 2020-02-12 NOTE — Telephone Encounter (Signed)
Patient's daughter [name] called stating her mother saw  Dr. Isidore Moos on Friday and wants to change to you as she was comfortable with you from her previous experience. Her daughter states her  Mother thinks her radiation treatments might change based on your recommendation. Daughter advised will talk with Dr. Sondra Come and see if he can call the patient [name].  Pt. Cell # [phone]  Daughter Blain Pais # [phone]

## 2020-02-12 NOTE — Progress Notes (Signed)
Radiation Oncology         [phone] ________________________________  Initial outpatient Consultation  Name: Hailey Cobb MRN: [id number]  Date: 02/09/2020  DOB: [birth year]  BT:YOMAYO, Lennette Bihari, MD  Brunetta Genera, MD   REFERRING PHYSICIAN: Brunetta Genera, MD  DIAGNOSIS:    ICD-10-CM   1. 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 Malignant neoplasm of lower lobe of left lung (HCC)  C34.32      CHIEF COMPLAINT: Here to discuss management of lung cancer  HISTORY OF PRESENT ILLNESS::Hailey Cobb is a 86 y.o. female who presented with a persistent cough.  Initially this was thought to be pneumonia.  Ultimately a CT scan in mid May revealed a 6.9 cm mass in the left lower lung with a pleural effusion.  PET scan on 01/09/2020 showed the mass to be hypermetabolic.  No other sign of disease.  I have personally reviewed her imaging.  It should be noted that she previously has a history of vulvar cancer treated by Dr. Sondra Come with radiotherapy.  This was clinical stage IB.  She received 50.4 Gray followed by a 14 Gy boost and completed that in mid October 2020.  She had a complete clinical response.  Biopsy of the left lower lung on 01/30/2020 showed invasive moderately differentiated squamous cell carcinoma.  To date, sampling of the pleural fluid has not revealed any cancerous cells.  She denies ever smoking.  She has a Pleurx drain in place.  It is causing her some pain.  She has shortness of breath with minimal activity.  She reports wheezing.  She has had a productive cough since March.  She denies any weight changes or hemoptysis.  She has chronic right hip and lower back pain.  She has met with medical oncology.  Her disease is not felt to be curable by Dr. Irene Limbo.  Systemic therapy is not recommended at this time.  This is in part due to her functional status and age.  She also has  significant anemia.  An urgent referral was made for consideration of radiation therapy.  There is concern about the rate of growth of this tumor.  PREVIOUS RADIATION THERAPY: Yes as above  PAST MEDICAL HISTORY:  has a past medical history of Anxiety, Autoimmune hepatitis ([place]), Burn (2015), CAD (coronary artery disease), Cancer ([place]), Cardiac arrest ([place]), Chronic diastolic CHF (congestive heart failure) ([place]), Complication of anesthesia, Diabetes mellitus without complication ([place]), Difficult intubation, Frequent PVCs, GERD (gastroesophageal reflux disease), History of kidney stones, Hypercholesteremia, Hypertension, Hypothyroidism, IBS (irritable bowel syndrome), Macular degeneration, Mass of lower lobe of left lung, On supplemental oxygen therapy, Osteoarthritis, Pleural effusion on left, Pneumonia, RBBB (right bundle branch block), S/P dilatation of esophageal stricture, 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    PAST SURGICAL HISTORY: Past Surgical History:  Procedure Laterality Date  . ABDOMINAL HYSTERECTOMY    . APPENDECTOMY    . APPLICATION OF A-CELL OF EXTREMITY Left 12/27/2014   Procedure: PLACEMENT OF A-CELL ;  Surgeon: Theodoro Kos, DO;  Location: [facility];  Service: Plastics;  Laterality: Left;  . BRONCHIAL BIOPSY  01/30/2020   Procedure: BRONCHIAL BIOPSIES;  Surgeon: Garner Nash, DO;  Location: [facility];  Service: Pulmonary;;  . BRONCHIAL BRUSHINGS  01/30/2020   Procedure: BRONCHIAL BRUSHINGS;  Surgeon: Garner Nash, DO;  Location: [facility];  Service: Pulmonary;;  . CATARACT EXTRACTION W/ INTRAOCULAR LENS  IMPLANT, BILATERAL Bilateral   . CHEST TUBE INSERTION N/A 01/30/2020   Procedure: INSERTION PLEURAL DRAINAGE CATHETER;  Surgeon: Garner Nash, DO;   Location: [facility];  Service: Pulmonary;  Laterality: N/A;  . CYSTOSCOPY/RETROGRADE/URETEROSCOPY Bilateral 08/11/2017   Procedure: CYSTOSCOPY/BILATERAL RETROGRADE AND BILATERAL STENT PLACEMENT;  Surgeon: Lucas Mallow, MD;  Location: [facility];  Service: Urology;  Laterality: Bilateral;  . CYSTOSCOPY/URETEROSCOPY/HOLMIUM LASER/STENT PLACEMENT Bilateral 08/25/2017   Procedure: CYSTOSCOPY/URETEROSCOPY/HOLMIUM LASER/STENT PLACEMENT, diagnosic right urteral stent removal;  Surgeon: Lucas Mallow, MD;  Location: [facility];  Service: Urology;  Laterality: Bilateral;  . ELBOW BURSA SURGERY Left   . EYE SURGERY    . HEMOSTASIS CONTROL  01/30/2020   Procedure: HEMOSTASIS CONTROL;  Surgeon: Garner Nash, DO;  Location: [facility];  Service: Pulmonary;;  . I & D EXTREMITY Left 12/27/2014   Procedure: IRRIGATION AND DEBRIDEMENT OF LEFT FOOT AND ANKLE BURN WOUNDS WITH SURGICAL PREPS ;  Surgeon: Theodoro Kos, DO;  Location: [facility];  Service: Plastics;  Laterality: Left;  . INCISION AND DRAINAGE OF WOUND Left 02/20/2015   Procedure: LEFT LEG WOUND IRRIGATION AND DEBRIDEMENT WITH ACELL/VAC PLACEMENT;  Surgeon: Theodoro Kos, DO;  Location: [facility];  Service: Plastics;  Laterality: Left;  . JOINT REPLACEMENT Left    Knee  . JOINT REPLACEMENT Left    Shoulder  . LARYNGOPLASTY  2011   @ [facility]    . RIGHT/LEFT HEART CATH AND CORONARY ANGIOGRAPHY N/A 04/06/2019   Procedure: RIGHT/LEFT HEART CATH AND CORONARY ANGIOGRAPHY;  Surgeon: Belva Crome, MD;  Location: [facility];  Service: Cardiovascular;  Laterality: N/A;  . TEE WITHOUT CARDIOVERSION N/A 05/18/2019   Procedure: TRANSESOPHAGEAL ECHOCARDIOGRAM (TEE);  Surgeon: Sherren Mocha, MD;  Location: [facility];  Service: Open Heart Surgery;  Laterality: N/A;  . thoracentesis    . THYROIDECTOMY  11-2008  . TOTAL SHOULDER ARTHROPLASTY Right 05/16/2014   Procedure: TOTAL SHOULDER ARTHROPLASTY;  Surgeon: Ninetta Lights, MD;  Location: [facility];  Service: Orthopedics;  Laterality: Right;  . TRANSCATHETER AORTIC VALVE REPLACEMENT, TRANSFEMORAL N/A 05/18/2019   Procedure: TRANSCATHETER AORTIC VALVE REPLACEMENT, TRANSFEMORAL;  Surgeon: Sherren Mocha, MD;  Location: [facility];  Service: Open Heart Surgery;  Laterality: N/A;  . VIDEO BRONCHOSCOPY N/A 01/30/2020   Procedure: VIDEO BRONCHOSCOPY WITHOUT FLUORO;  Surgeon: Garner Nash, DO;  Location: [facility];  Service: Pulmonary;  Laterality: N/A;  . vocal laryngoplasty     s/p left vocal fold medialization laryngoplasty with Goretex 02/07/10 (Dr. Wonda Amis)    FAMILY HISTORY: family history includes Colon cancer in her mother; Multiple sclerosis in her sister; Prostate cancer in her brother; Stomach cancer in her mother; Stroke in her brother, brother, and sister.  SOCIAL HISTORY:  reports that she has never smoked. She has never used smokeless tobacco. She reports that she does not drink alcohol and does not use drugs.  ALLERGIES: Metoprolol, Clonidine derivatives, Glimepiride, and Invokana [canagliflozin]  MEDICATIONS:  Current Outpatient Medications  Medication Sig Dispense Refill  . ALPRAZolam (XANAX) 0.5 MG tablet Take 0.5 mg by mouth 3 (three) times daily as needed for anxiety.     Marland Kitchen amLODipine (NORVASC) 5 MG tablet Take 2 tablets (10 mg total) by mouth daily.    Marland Kitchen amoxicillin (AMOXIL) 500 MG tablet Take 4 capsules (2,045m) one hour  prior to all dental visits. 8 tablet 3  . aspirin 81 MG chewable tablet Chew 81 mg by mouth daily.    . Azelastine HCl (ASTEPRO) 0.15 % SOLN Place 2 sprays into both nostrils at bedtime.     . benzonatate (TESSALON PERLES) 100 MG capsule Take 2 capsules (200 mg total) by mouth 3 (three) times daily as needed for cough. 60 capsule 3  . Cyanocobalamin (B-12 COMPLIANCE INJECTION IJ) Inject as directed every 30 (thirty) days.    Marland Kitchen doxazosin (CARDURA) 2 MG tablet Take 4-8 mg by mouth See admin instructions. Take 4 mg by mouth in the morning and 8 mg  at bedtime    . furosemide (LASIX) 40 MG tablet Take 0.5-1 tablets (20-40 mg total) by mouth See admin instructions. Take 20 mg and 40 mg alternate every other day    . Guaifenesin (MUCINEX MAXIMUM STRENGTH) 1200 MG TB12 Take 1,200 mg by mouth in the morning and at bedtime.    Marland Kitchen HYDROcodone-acetaminophen (NORCO/VICODIN) 5-325 MG tablet Take 1-2 tablets by mouth every 4 (four) hours as needed for severe pain. Do not take and drive 30 tablet 0  . HYDROcodone-homatropine (HYCODAN) 5-1.5 MG/5ML syrup Take 5 mLs by mouth every 6 (six) hours as needed for cough. 240 mL 0  . irbesartan (AVAPRO) 150 MG tablet Take 150 mg by mouth 2 (two) times daily.    . iron polysaccharides (NIFEREX) 150 MG capsule Take 1 capsule (150 mg total) by mouth daily. 90 capsule 3  . levETIRAcetam (KEPPRA) 500 MG tablet Take 500 mg by mouth daily.    Marland Kitchen levothyroxine (SYNTHROID) 125 MCG tablet Take 125 mcg by mouth daily before breakfast.    . metFORMIN (GLUCOPHAGE) 1000 MG tablet Take 500 mg by mouth 2 (two) times daily with a meal. 500 mg in AM and 500 mg in PM    . Multiple Vitamins-Minerals (PRESERVISION AREDS PO) Take 1 tablet by mouth daily.    Marland Kitchen OVER THE COUNTER MEDICATION Take 3 tablets by mouth daily. Thymic factor vitamins    . polyvinyl alcohol (LIQUIFILM TEARS) 1.4 % ophthalmic solution Place 1 drop into both eyes daily.     . rosuvastatin (CRESTOR) 20 MG tablet Take 20 mg by mouth every other day.     . sitaGLIPtin (JANUVIA) 100 MG tablet Take 100 mg by mouth daily.     . cephALEXin (KEFLEX) 500 MG capsule Take 1 capsule (500 mg total) by mouth 4 (four) times daily for 5 days. 20 capsule 0   No current facility-administered medications for this encounter.   Facility-Administered Medications Ordered in Other Encounters  Medication Dose Route Frequency Provider Last Rate Last Admin  . lactated ringers infusion   Intravenous Continuous PRN Lavina Hamman, CRNA   New Bag at 01/18/20 1050    REVIEW OF SYSTEMS:   Notable for that above.   PHYSICAL EXAM:  height is '5\' 4"'  (1.626 m) and weight is 178 lb 8 oz (81 kg). Her temporal temperature is 97.5 F (36.4 C) (abnormal). Her blood pressure is 139/58 (abnormal) and her pulse is 60. Her respiration is 18 and oxygen saturation is 97%.    General: Alert and oriented, in no acute distress HEENT: Head is normocephalic.  Heart: Regular in rate and rhythm  Chest: Adequate airflow bilaterally.  No obvious rhonchi or wheezes.  Pleurx catheter in place. Psychiatric: Judgment and insight are intact. Affect is appropriate.    LABORATORY DATA:  Lab Results  Component Value Date  WBC 5.7 01/31/2020   HGB 8.1 (L) 01/31/2020   HCT 26.1 (L) 01/31/2020   MCV 90.0 01/31/2020   PLT 217 01/31/2020   CMP     Component Value Date/Time   NA 137 02/09/2020 1324   NA 134 10/10/2019 1128   K 5.4 (H) 02/09/2020 1324   CL 98 02/09/2020 1324   CO2 28 02/09/2020 1324   GLUCOSE 124 (H) 02/09/2020 1324   BUN 11 02/09/2020 1324   BUN 15 10/10/2019 1128   CREATININE 0.75 02/09/2020 1324   CALCIUM 9.9 02/09/2020 1324   PROT 6.9 02/09/2020 1324   ALBUMIN 2.8 (L) 02/09/2020 1324   AST 19 02/09/2020 1324   ALT 26 02/09/2020 1324   ALKPHOS 98 02/09/2020 1324   BILITOT 0.3 02/09/2020 1324   GFRNONAA >60 02/09/2020 1324   GFRAA >60 02/09/2020 1324         RADIOGRAPHY: DG Chest 1 View  Result Date: 01/24/2020 CLINICAL DATA:  Status post thoracentesis EXAM: CHEST  1 VIEW COMPARISON:  Sixty 20 FINDINGS: Resolution of the left effusion following thoracentesis. No pneumothorax. Stable cardiomegaly and some residual left lower lobe atelectasis/consolidation. Previous TAVR noted. Stable right lung aeration. Aorta atherosclerotic. Degenerative changes of the spine. Shoulder replacements bilaterally. IMPRESSION: Resolved left effusion following thoracentesis. No complicating feature or pneumothorax. Electronically Signed   By: Jerilynn Mages.  Shick M.D.   On: 01/24/2020 14:33   DG Chest 2  View  Result Date: 01/24/2020 CLINICAL DATA:  Shortness of breath and known left-sided effusion EXAM: CHEST - 2 VIEW COMPARISON:  01/19/2020 FINDINGS: Cardiac shadow is enlarged but stable. TAVR changes are seen. Increasing left-sided effusion is noted there is likely underlying atelectasis/infiltrate present. Right lung remains clear. No acute bony abnormality is seen. IMPRESSION: Increasing left-sided effusion. Electronically Signed   By: Inez Catalina M.D.   On: 01/24/2020 12:25   CT Head W Wo Contrast  Result Date: 02/13/2020 CLINICAL DATA:  86 year old female with newly diagnosed non-small cell lung cancer. EXAM: CT HEAD WITHOUT AND WITH CONTRAST TECHNIQUE: Contiguous axial images were obtained from the base of the skull through the vertex without and with intravenous contrast CONTRAST:  50m OMNIPAQUE IOHEXOL 300 MG/ML  SOLN COMPARISON:  PET-CT 01/09/2020 head CT without contrast 05/11/2019. Brain MRI 07/24/2014. FINDINGS: Brain: Cerebral volume is stable since 2020. No midline shift, ventriculomegaly, mass effect, evidence of mass lesion, intracranial hemorrhage or evidence of cortically based acute infarction. Moderate bilateral cerebral white matter patchy hypodensity appears stable. No cortical encephalomalacia identified. No abnormal enhancement identified. Vascular: Calcified atherosclerosis at the skull base. The major intracranial vascular structures are enhancing as expected. Skull: Stable.  No acute osseous abnormality identified. Sinuses/Orbits: Visualized paranasal sinuses and mastoids are stable and well pneumatized. Other: No acute orbit or scalp soft tissue findings. IMPRESSION: 1. No metastatic disease or acute intracranial abnormality. 2. Moderate for age chronic white matter changes most often due to small vessel disease. Electronically Signed   By: HGenevie AnnM.D.   On: 02/13/2020 07:42   CT ANGIO CHEST PE W OR WO CONTRAST  Result Date: 01/18/2020 CLINICAL DATA:  Left lower lobe lung  mass with weakness and cough. EXAM: CT ANGIOGRAPHY CHEST WITH CONTRAST TECHNIQUE: Multidetector CT imaging of the chest was performed using the standard protocol during bolus administration of intravenous contrast. Multiplanar CT image reconstructions and MIPs were obtained to evaluate the vascular anatomy. CONTRAST:  1030mOMNIPAQUE IOHEXOL 350 MG/ML SOLN COMPARISON:  Chest CT 12/29/2019 and PET-CT 01/09/2020 FINDINGS: Cardiovascular: Stable mild  cardiac enlargement. No pericardial effusion. Stable changes from a TAVR. No aortic aneurysm or dissection. Minimal scattered atherosclerotic calcifications for age. The pulmonary arterial tree is fairly well opacified. No definite filling defects to suggest pulmonary embolism. Mediastinum/Nodes: Small scattered mediastinal and hilar lymph nodes are stable. No mass or overt adenopathy. The esophagus is grossly normal. Lungs/Pleura: Enlarging moderate-sized left pleural effusion progressive since the PET-CT of 01/09/2020. There is complete left lower lobe atelectasis surrounding the left lower lobe cavitary mass. The left lower lobe bronchi are filled with fluid and debris. There is also moderate compressive atelectasis in the lingula. The right lung is relatively clear. No worrisome right lung lesions, infiltrates or significant atelectasis. Very small right pleural effusion. Upper Abdomen: No significant upper abdominal findings are identified. No hepatic or adrenal gland lesions are identified. Musculoskeletal: No significant bony findings. Review of the MIP images confirms the above findings. IMPRESSION: 1. No CT findings for pulmonary embolism. 2. Stable changes from a TAVR. No aortic aneurysm or dissection. 3. Enlarging moderate-sized left pleural effusion with complete left lower lobe atelectasis surrounding the left lower lobe cavitary mass. The left lower lobe bronchi are filled with fluid and debris. There is also moderate compressive atelectasis in the lingula.  4. Very small right pleural effusion. 5. No worrisome right lung lesions, infiltrates or significant atelectasis. 6. Aortic atherosclerosis. Aortic Atherosclerosis (ICD10-I70.0). Electronically Signed   By: Marijo Sanes M.D.   On: 01/18/2020 12:58   Portable chest 1 View  Result Date: 01/31/2020 CLINICAL DATA:  Status post bronchoscopy EXAM: PORTABLE CHEST 1 VIEW COMPARISON:  January 30, 2020 FINDINGS: PleurX catheter on the left present with tip directed inferiorly. No pneumothorax. There is a small left pleural effusion with ill-defined airspace opacity in the left lower lobe region, similar to 1 day prior. No cavitation appreciable by radiography. Lungs otherwise clear. Heart is mildly enlarged with pulmonary vascularity normal. There is an aortic valve replacement. There is aortic atherosclerosis. No adenopathy appreciable. Surgical clips are noted in the thyroid region. Total shoulder replacements noted bilaterally. IMPRESSION: No pneumothorax. Unchanged PleurX catheter position on the left inferiorly. Left base atelectasis with small left pleural effusion remain. No new opacity. No cavitation evident. Stable cardiac prominence. Status post aortic valve replacement. Aortic Atherosclerosis (ICD10-I70.0). Electronically Signed   By: Lowella Grip III M.D.   On: 01/31/2020 08:22   DG Chest Port 1 View  Result Date: 01/30/2020 CLINICAL DATA:  Status post bronchoscopy. EXAM: PORTABLE CHEST 1 VIEW COMPARISON:  January 24, 2020. FINDINGS: Stable cardiomediastinal silhouette. Status post transcatheter aortic valve repair. Left pleural drainage catheter is noted. No pneumothorax or pleural effusion is noted. Right lung is clear. Minimal left basilar subsegmental atelectasis is noted. Status post bilateral shoulder arthroplasties. Atherosclerosis of thoracic aorta is noted. IMPRESSION: Left basilar pleural drainage catheter is noted. Minimal left basilar subsegmental atelectasis. No pneumothorax is noted. Aortic  Atherosclerosis (ICD10-I70.0). Electronically Signed   By: Marijo Conception M.D.   On: 01/30/2020 13:29   DG Chest Port 1 View  Result Date: 01/19/2020 CLINICAL DATA:  86 year old female with history of left pleural effusion. EXAM: PORTABLE CHEST 1 VIEW COMPARISON:  Chest x-ray 01/18/2020. FINDINGS: Lung volumes are slightly low. Opacity in the left base which may reflect atelectasis and/or consolidation. Moderate left pleural effusion slightly increased compared to the prior study. Right lung is clear. No right pleural effusion. No pneumothorax. No evidence of pulmonary edema. Heart size is mildly enlarged. Upper mediastinal contours are within normal limits. Aortic  atherosclerosis. Status post TAVR. Status post bilateral shoulder arthroplasty. IMPRESSION: 1. Persistent and increasing moderate left pleural effusion with left basilar atelectasis and/or consolidation. 2. Aortic atherosclerosis. Electronically Signed   By: Vinnie Langton M.D.   On: 01/19/2020 08:07   DG Chest Port 1 View  Result Date: 01/18/2020 CLINICAL DATA:  Status post thoracentesis. EXAM: PORTABLE CHEST 1 VIEW COMPARISON:  Prior same day chest radiograph. FINDINGS: Decreased left pleural effusion now small in size. Left basilar/retrocardiac confluent opacities are also decreased. Background pulmonary edema with diffuse interstitial prominence and perihilar vessel engorgement. No pneumothorax. Partially obscured cardiomediastinal silhouette. Sequela of TAVR. Bilateral humeral prostheses. IMPRESSION: Small left pleural effusion and left basilar/retrocardiac opacities, decreased from prior exam. Electronically Signed   By: Primitivo Gauze M.D.   On: 01/18/2020 16:26   DG Chest Port 1 View  Result Date: 01/18/2020 CLINICAL DATA:  Hypoxemia. EXAM: PORTABLE CHEST 1 VIEW COMPARISON:  12/27/2019 FINDINGS: The patient has developed a large left pleural effusion since the prior study 12/27/2019. Overall heart size and pulmonary vascularity  are normal. Right lung is clear. No acute bone abnormality. TAVR. Multiple surgical clips in the region of the thyroid consistent previous reported thyroidectomy. Bilateral proximal humeral prostheses, unchanged. IMPRESSION: New large left pleural effusion. Electronically Signed   By: Lorriane Shire M.D.   On: 01/18/2020 11:24   ECHOCARDIOGRAM COMPLETE  Result Date: 01/31/2020    ECHOCARDIOGRAM REPORT   Patient Name:   JOY REIGER Date of Exam: 01/31/2020 Medical Rec #:  [id number]      Height:       64.0 in Accession #:    [id number]     Weight:       181.0 lb Date of Birth:  [birth year]       BSA:          1.875 m Patient Age:    53 years       BP:           122/51 mmHg Patient Gender: F              HR:           55 bpm. Exam Location:  Inpatient Procedure: 2D Echo, Cardiac Doppler and Color Doppler Indications:    Atrial fibrillation  History:        Patient has prior history of Echocardiogram examinations, most                 recent 06/08/2019. Aortic Valve Disease; Risk Factors:Diabetes                 and Hypertension. S/p TAVR. 26 mm Edwards-Sapien valve.  Sonographer:    Clayton Lefort RDCS (AE) Referring Phys: [id number] [place]  1. Left ventricular ejection fraction, by estimation, is 60 to 65%. The left ventricle has normal function. The left ventricle has no regional wall motion abnormalities. There is moderate left ventricular hypertrophy of the basal-septal segment. Left ventricular diastolic parameters are consistent with Grade II diastolic dysfunction (pseudonormalization). Elevated left ventricular end-diastolic pressure.  2. Right ventricular systolic function is normal. The right ventricular size is normal. There is moderately elevated pulmonary artery systolic pressure. The estimated right ventricular systolic pressure is 27.7 mmHg.  3. Left atrial size was severely dilated.  4. The mitral valve is degenerative. Mild mitral valve regurgitation. No evidence of mitral stenosis.   5. The aortic valve has been repaired/replaced. There is a 26 mm Edwards Sapien prosthetic, stented (TAVR) valve present  in the aortic position. Aortic valve regurgitation is not visualized. No aortic stenosis is present.  6. The inferior vena cava is normal in size with greater than 50% respiratory variability, suggesting right atrial pressure of 3 mmHgCompared to echo 05/2019, normal rfunctioning TAVR with mean AVG of TAVR 26mHg which is essentially unchanged from prior echo. PASP has increased from 311mg to 5662m. FINDINGS  Left Ventricle: Left ventricular ejection fraction, by estimation, is 60 to 65%. The left ventricle has normal function. The left ventricle has no regional wall motion abnormalities. The left ventricular internal cavity size was normal in size. There is  moderate left ventricular hypertrophy of the basal-septal segment. Left ventricular diastolic parameters are consistent with Grade II diastolic dysfunction (pseudonormalization). Elevated left ventricular end-diastolic pressure. Right Ventricle: The right ventricular size is normal. No increase in right ventricular wall thickness. Right ventricular systolic function is normal. There is moderately elevated pulmonary artery systolic pressure. The tricuspid regurgitant velocity is 3.47 m/s, and with an assumed right atrial pressure of 8 mmHg, the estimated right ventricular systolic pressure is 56.05.6Hg. Left Atrium: Left atrial size was severely dilated. Right Atrium: Right atrial size was normal in size. Pericardium: There is no evidence of pericardial effusion. Mitral Valve: The mitral valve is degenerative in appearance. There is mild thickening of the mitral valve leaflet(s). There is moderate calcification of the mitral valve leaflet(s). Normal mobility of the mitral valve leaflets. Mild to moderate mitral annular calcification. Mild mitral valve regurgitation. No evidence of mitral valve stenosis. MV peak gradient, 6.6 mmHg. The mean  mitral valve gradient is 2.0 mmHg. Tricuspid Valve: The tricuspid valve is normal in structure. Tricuspid valve regurgitation is mild . No evidence of tricuspid stenosis. Aortic Valve: The aortic valve has been repaired/replaced. Aortic valve regurgitation is not visualized. Aortic valve mean gradient measures 11.4 mmHg. Aortic valve peak gradient measures 25.0 mmHg. Aortic valve area, by VTI measures 2.55 cm. There is a  26 mm Edwards Sapien prosthetic, stented (TAVR) valve present in the aortic position. Pulmonic Valve: The pulmonic valve was normal in structure. Pulmonic valve regurgitation is not visualized. No evidence of pulmonic stenosis. Aorta: The aortic root is normal in size and structure. Venous: The inferior vena cava is normal in size with less than 50% respiratory variability, suggesting right atrial pressure of 8 mmHg. IAS/Shunts: The interatrial septum was not well visualized. Cannot rule out PFO. Consider Agitated saline contrast bubble study.  LEFT VENTRICLE PLAX 2D LVIDd:         3.90 cm  Diastology LVIDs:         2.80 cm  LV e' lateral:   4.90 cm/s LV PW:         1.80 cm  LV E/e' lateral: 26.9 LV IVS:        1.60 cm  LV e' medial:    5.98 cm/s LVOT diam:     2.60 cm  LV E/e' medial:  22.1 LV SV:         146 LV SV Index:   78 LVOT Area:     5.31 cm  RIGHT VENTRICLE             IVC RV Basal diam:  3.00 cm     IVC diam: 2.00 cm RV S prime:     12.70 cm/s TAPSE (M-mode): 2.8 cm LEFT ATRIUM              Index       RIGHT ATRIUM  Index LA diam:        4.20 cm  2.24 cm/m  RA Area:     14.80 cm LA Vol (A2C):   104.0 ml 55.46 ml/m RA Volume:   35.70 ml  19.04 ml/m LA Vol (A4C):   88.5 ml  47.20 ml/m LA Biplane Vol: 99.0 ml  52.80 ml/m  AORTIC VALVE AV Area (Vmax):    2.34 cm AV Area (Vmean):   2.36 cm AV Area (VTI):     2.55 cm AV Vmax:           250.00 cm/s AV Vmean:          155.600 cm/s AV VTI:            0.571 m AV Peak Grad:      25.0 mmHg AV Mean Grad:      11.4 mmHg LVOT Vmax:          110.20 cm/s LVOT Vmean:        69.220 cm/s LVOT VTI:          0.274 m LVOT/AV VTI ratio: 0.48 MITRAL VALVE                TRICUSPID VALVE MV Area (PHT): 2.24 cm     TR Peak grad:   48.2 mmHg MV Peak grad:  6.6 mmHg     TR Vmax:        347.00 cm/s MV Mean grad:  2.0 mmHg MV Vmax:       1.28 m/s     SHUNTS MV Vmean:      66.7 cm/s    Systemic VTI:  0.27 m MV Decel Time: 338 msec     Systemic Diam: 2.60 cm MV E velocity: 132.00 cm/s MV A velocity: 111.00 cm/s MV E/A ratio:  1.19 Fransico Him MD Electronically signed by Fransico Him MD Signature Date/Time: 01/31/2020/12:54:21 PM    Final       IMPRESSION/PLAN: Today, I talked to the patient about the findings and work-up thus far. 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 We discussed the available radiation techniques, and focused on the details of logistics and delivery.    In my opinion, there is a significant possibility that this is a metastatic lesion.  Although it is a solitary lesion, it would be very unusual for a patient without a smoking history does have primary squamous cell carcinoma of the lung.  The timing of this lesion developing also makes it suspicious for metastatic tumor.  I explained that there is a significant risk that she will develop disease elsewhere in her body even if the radiotherapy procures local control where we aim the beams.  We discussed a hypofractionated regimen versus a standard fractionation regimen.  Treatment can take place for as little as 2 weeks versus 6 weeks.  We talked about the pros and cons of each treatment approach.  I recommend a 2-week approach in which we give as much dose as we can safely administer per treatment over a total of 10 treatments.  She and her daughter are amenable to this plan.  We discussed the risks, benefits, and side effects of radiotherapy. Side  effects may include but not necessarily be limited to: Skin irritation, internal organ injury such as to the heart, esophagus, and lungs; fatigue, cough; permanent serious injury to the thorax. No guarantees of treatment  were given. A consent form was signed and placed in the patient's medical record. The patient was encouraged to ask questions that I answered to the best of my ability.   Simulation will take place today.  I offered to plan the patient's treatment and follow her during the treatment course versus transferring her care to Dr. Sondra Come.  At this time they would like to keep their care with me.  Addendum: I received word a few days after the consultation that the patient and her daughter would like to transfer her care back to Dr. Sondra Come.  I Spoke with Dr. Sondra Come in detail about her case and he will conduct her treatment planning and follow her during treatment.  On date of service, in total, I spent 50 minutes on this encounter. She was seen in person with her daughter.    __________________________________________   Eppie Gibson, MD

## 2020-02-13 ENCOUNTER — Ambulatory Visit: Payer: Medicare Other | Admitting: Adult Health

## 2020-02-13 ENCOUNTER — Encounter: Payer: Self-pay | Admitting: Radiation Oncology

## 2020-02-13 DIAGNOSIS — C3432 Malignant neoplasm of lower lobe, left bronchus or lung: Secondary | ICD-10-CM | POA: Diagnosis not present

## 2020-02-13 DIAGNOSIS — E119 Type 2 diabetes mellitus without complications: Secondary | ICD-10-CM | POA: Diagnosis not present

## 2020-02-13 DIAGNOSIS — J449 Chronic obstructive pulmonary disease, unspecified: Secondary | ICD-10-CM | POA: Diagnosis not present

## 2020-02-13 DIAGNOSIS — I4891 Unspecified atrial fibrillation: Secondary | ICD-10-CM | POA: Diagnosis not present

## 2020-02-13 DIAGNOSIS — R918 Other nonspecific abnormal finding of lung field: Secondary | ICD-10-CM | POA: Diagnosis not present

## 2020-02-13 DIAGNOSIS — J90 Pleural effusion, not elsewhere classified: Secondary | ICD-10-CM | POA: Diagnosis not present

## 2020-02-13 DIAGNOSIS — Z48813 Encounter for surgical aftercare following surgery on the respiratory system: Secondary | ICD-10-CM | POA: Diagnosis not present

## 2020-02-14 NOTE — Progress Notes (Signed)
  Radiation Oncology         [phone] ________________________________  Name: Hailey Cobb MRN: [id number]  Date: 02/15/2020  DOB: [birth year]  Simulation Verification Note    ICD-10-CM   1. Cavitating mass in left lower lung lobe  J98.4     Status: outpatient   NARRATIVE: The patient was brought to the treatment unit and placed in the planned treatment position. The clinical setup was verified. Then port films were obtained and uploaded to the radiation oncology medical record software. The treatment beams were carefully compared against the planned radiation fields. The position location and shape of the radiation fields was reviewed. They targeted volume of tissue appears to be appropriately covered by the radiation beams. Organs at risk appear to be excluded as planned.  Based on my personal review, I approved the simulation verification. The patient's treatment will proceed as planned.   -----------------------------------  Blair Promise, PhD, MD  This document serves as a record of services personally performed by Gery Pray, MD. It was created on his behalf by Clerance Lav, a trained medical scribe. The creation of this record is based on the scribe's personal observations and the provider's statements to them. This document has been checked and approved by the attending provider.

## 2020-02-15 ENCOUNTER — Ambulatory Visit
Admission: RE | Admit: 2020-02-15 | Discharge: 2020-02-15 | Disposition: A | Discharge status: Home / Self Care | Payer: Medicare Other | Source: Ambulatory Visit | Attending: Radiation Oncology | Admitting: Radiation Oncology

## 2020-02-15 ENCOUNTER — Other Ambulatory Visit: Payer: Self-pay

## 2020-02-15 ENCOUNTER — Telehealth: Payer: Self-pay | Admitting: Pulmonary Disease

## 2020-02-15 DIAGNOSIS — J984 Other disorders of lung: Secondary | ICD-10-CM

## 2020-02-15 DIAGNOSIS — C3432 Malignant neoplasm of lower lobe, left bronchus or lung: Secondary | ICD-10-CM | POA: Diagnosis not present

## 2020-02-15 MED ORDER — HYDROCODONE-HOMATROPINE 5-1.5 MG/5ML PO SYRP: 5.0000 mL | ORAL_SOLUTION | Freq: Four times a day (QID) | ORAL | 0 refills | Status: AC | PRN

## 2020-02-15 NOTE — Telephone Encounter (Signed)
Called and let the daughter know the script had been sent to the pharmacy.  Nothing further  Needed.

## 2020-02-15 NOTE — Telephone Encounter (Signed)
Spoke with daughter, Ms. Culler listed on the [facility] regarding request for Hycodan syrup.  Dr. Lamonte Sakai, Please advise.  Thank you.

## 2020-02-15 NOTE — Telephone Encounter (Signed)
Please let her know script sent to [facility] [address]

## 2020-02-16 ENCOUNTER — Telehealth: Payer: Self-pay | Admitting: Hematology

## 2020-02-16 ENCOUNTER — Ambulatory Visit
Admission: RE | Admit: 2020-02-16 | Discharge: 2020-02-16 | Disposition: A | Discharge status: Home / Self Care | Payer: Medicare Other | Source: Ambulatory Visit | Attending: Radiation Oncology | Admitting: Radiation Oncology

## 2020-02-16 ENCOUNTER — Other Ambulatory Visit: Payer: Self-pay

## 2020-02-16 ENCOUNTER — Inpatient Hospital Stay: Payer: Medicare Other | Attending: Hematology

## 2020-02-16 VITALS — BP 110/62 | HR 77 | Temp 98.4°F | Resp 18

## 2020-02-16 DIAGNOSIS — J910 Malignant pleural effusion: Secondary | ICD-10-CM | POA: Insufficient documentation

## 2020-02-16 DIAGNOSIS — E538 Deficiency of other specified B group vitamins: Secondary | ICD-10-CM

## 2020-02-16 DIAGNOSIS — C3432 Malignant neoplasm of lower lobe, left bronchus or lung: Secondary | ICD-10-CM | POA: Insufficient documentation

## 2020-02-16 DIAGNOSIS — E039 Hypothyroidism, unspecified: Secondary | ICD-10-CM | POA: Insufficient documentation

## 2020-02-16 DIAGNOSIS — R531 Weakness: Secondary | ICD-10-CM | POA: Diagnosis not present

## 2020-02-16 DIAGNOSIS — R42 Dizziness and giddiness: Secondary | ICD-10-CM | POA: Diagnosis not present

## 2020-02-16 DIAGNOSIS — E43 Unspecified severe protein-calorie malnutrition: Secondary | ICD-10-CM | POA: Insufficient documentation

## 2020-02-16 DIAGNOSIS — D61818 Other pancytopenia: Secondary | ICD-10-CM | POA: Diagnosis not present

## 2020-02-16 MED ORDER — CYANOCOBALAMIN 1000 MCG/ML IJ SOLN
INTRAMUSCULAR | Status: AC
  Filled 2020-02-16: qty 1

## 2020-02-16 MED ORDER — CYANOCOBALAMIN 1000 MCG/ML IJ SOLN
1000.0000 ug | INTRAMUSCULAR | Status: DC
  Administered 2020-02-16: 1000 ug via SUBCUTANEOUS

## 2020-02-16 MED ORDER — SODIUM CHLORIDE 0.9 % IV SOLN
750.0000 mg | Freq: Once | INTRAVENOUS | Status: AC
  Administered 2020-02-16: 750 mg via INTRAVENOUS
  Filled 2020-02-16: qty 15

## 2020-02-16 MED ORDER — SODIUM CHLORIDE 0.9 % IV SOLN
Freq: Once | INTRAVENOUS | Status: AC
  Filled 2020-02-16: qty 250

## 2020-02-16 MED ORDER — ACETAMINOPHEN 325 MG PO TABS
ORAL_TABLET | ORAL | Status: AC
  Filled 2020-02-16: qty 2

## 2020-02-16 MED ORDER — ACETAMINOPHEN 325 MG PO TABS
650.0000 mg | ORAL_TABLET | Freq: Once | ORAL | Status: AC
  Administered 2020-02-16: 650 mg via ORAL

## 2020-02-16 NOTE — Patient Instructions (Signed)

## 2020-02-16 NOTE — Telephone Encounter (Signed)
Scheduled per 06/22 los, spoke with patient's daughter and patient will be notified of upcoming appointments.

## 2020-02-18 NOTE — Progress Notes (Signed)
PCCM:  Thanks for seeing her  Garner Nash, DO [facility] 02/18/2020 6:05 PM

## 2020-02-20 ENCOUNTER — Other Ambulatory Visit: Payer: Self-pay | Admitting: Radiation Oncology

## 2020-02-20 ENCOUNTER — Telehealth: Payer: Self-pay

## 2020-02-20 ENCOUNTER — Ambulatory Visit
Admission: RE | Admit: 2020-02-20 | Discharge: 2020-02-20 | Disposition: A | Discharge status: Home / Self Care | Payer: Medicare Other | Source: Ambulatory Visit | Attending: Radiation Oncology | Admitting: Radiation Oncology

## 2020-02-20 ENCOUNTER — Telehealth: Payer: Self-pay | Admitting: Pulmonary Disease

## 2020-02-20 ENCOUNTER — Other Ambulatory Visit: Payer: Self-pay

## 2020-02-20 DIAGNOSIS — C3432 Malignant neoplasm of lower lobe, left bronchus or lung: Secondary | ICD-10-CM | POA: Diagnosis not present

## 2020-02-20 MED ORDER — HYDROCOD POLST-CPM POLST ER 10-8 MG/5ML PO SUER: 5.0000 mL | Freq: Every evening | ORAL | 0 refills | Status: DC | PRN

## 2020-02-20 NOTE — Telephone Encounter (Signed)
1043 am TC to patient's daughter, [name] after RN received phone record from on call nurse on 02/18/2020. Patient had fallen on 02/17/20 and her chest tube started bleeding more 2 hours after she fell. Patient refused to go to the ED. Daughter reports she is doing okay except she is coughing more and has more nausea. Patient has cough syrup with codeine in it and Purls to use. Daughter advised she needs to let the MD who placed the Chest Tube be aware of the fall and increase in blood from the chest tube. Daughter verbalized understanding. Pt. Will be in this afternoon for her radiation tx.

## 2020-02-20 NOTE — Telephone Encounter (Signed)
Spoke with the pt's daughter [name]  She states that pt started RT and had her first treatment on 7/1 and then again on 7/2  She has noticed that when she drains the fluid in her pleurx now it is red in color, when before it was a more copper color She has noticed pt coughing slightly more  No other new concerns  Called oncology and was told that this was normal after starting RT by one nurse and not normal by another and wants to get clarification  Please advise thanks

## 2020-02-20 NOTE — Telephone Encounter (Signed)
Patients daughter is aware nothing further needed at this time.

## 2020-02-20 NOTE — Telephone Encounter (Signed)
Its ok. Just monitor drainage. Coughing is normal post radiation.  If there is bright red blood she should let us know.  Otherwise ok to continue.  Garner Nash, DO [facility] 02/20/2020 12:50 PM

## 2020-02-21 ENCOUNTER — Ambulatory Visit
Admission: RE | Admit: 2020-02-21 | Discharge: 2020-02-21 | Disposition: A | Discharge status: Home / Self Care | Payer: Medicare Other | Source: Ambulatory Visit | Attending: Radiation Oncology | Admitting: Radiation Oncology

## 2020-02-21 ENCOUNTER — Other Ambulatory Visit: Payer: Self-pay

## 2020-02-21 DIAGNOSIS — C3432 Malignant neoplasm of lower lobe, left bronchus or lung: Secondary | ICD-10-CM | POA: Diagnosis not present

## 2020-02-22 ENCOUNTER — Ambulatory Visit
Admission: RE | Admit: 2020-02-22 | Discharge: 2020-02-22 | Disposition: A | Discharge status: Home / Self Care | Payer: Medicare Other | Source: Ambulatory Visit | Attending: Radiation Oncology | Admitting: Radiation Oncology

## 2020-02-22 DIAGNOSIS — C3432 Malignant neoplasm of lower lobe, left bronchus or lung: Secondary | ICD-10-CM | POA: Diagnosis not present

## 2020-02-22 NOTE — Progress Notes (Signed)
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 right ventricular systolic pressure is 37.9 mmHg. Left Atrium: Left atrial size was severely dilated. Right Atrium: Right atrial size was normal in size. Pericardium: There is no evidence of pericardial effusion. Mitral Valve: The mitral valve is degenerative in appearance. There is mild thickening of the mitral valve leaflet(s). There is moderate calcification of the mitral valve leaflet(s). Normal mobility of the mitral valve leaflets. Mild to moderate mitral annular calcification. Mild mitral valve regurgitation. No evidence of mitral valve stenosis. MV peak gradient, 6.6 mmHg. The mean mitral valve gradient is 2.0 mmHg. Tricuspid Valve: The tricuspid valve is normal in structure. Tricuspid valve regurgitation is mild . No evidence of tricuspid stenosis. Aortic Valve: The aortic valve has been repaired/replaced. Aortic valve regurgitation is not visualized. Aortic valve mean gradient measures 11.4 mmHg. Aortic valve peak gradient measures 25.0 mmHg. Aortic valve area, by VTI measures 2.55 cm. There is a  26 mm Edwards Sapien prosthetic, stented (TAVR) valve present in  the aortic position. Pulmonic Valve: The pulmonic valve was normal in structure. Pulmonic valve regurgitation is not visualized. No evidence of pulmonic stenosis. Aorta: The aortic root is normal in size and structure. Venous: The inferior vena cava is normal in size with less than 50% respiratory variability, suggesting right atrial pressure of 8 mmHg. IAS/Shunts: The interatrial septum was not well visualized. Cannot rule out PFO. Consider Agitated saline contrast bubble study.  LEFT VENTRICLE PLAX 2D LVIDd:         3.90 cm  Diastology LVIDs:         2.80 cm  LV e' lateral:   4.90 cm/s LV PW:         1.80 cm  LV E/e' lateral: 26.9 LV IVS:        1.60 cm  LV e' medial:    5.98 cm/s LVOT diam:     2.60 cm  LV E/e' medial:  22.1 LV SV:         146 LV SV Index:   78 LVOT Area:     5.31 cm  RIGHT VENTRICLE             IVC RV Basal diam:  3.00 cm     IVC diam: 2.00 cm RV S prime:     12.70 cm/s TAPSE (M-mode): 2.8 cm LEFT ATRIUM              Index       RIGHT ATRIUM           Index LA diam:        4.20 cm  2.24 cm/m  RA Area:     14.80 cm LA Vol (A2C):   104.0 ml 55.46 ml/m RA Volume:   35.70 ml  19.04 ml/m LA Vol (A4C):   88.5 ml  47.20 ml/m LA Biplane Vol: 99.0 ml  52.80 ml/m  AORTIC VALVE AV Area (Vmax):  2.34 cm AV Area (Vmean):   2.36 cm AV Area (VTI):     2.55 cm AV Vmax:           250.00 cm/s AV Vmean:          155.600 cm/s AV VTI:            0.571 m AV Peak Grad:      25.0 mmHg AV Mean Grad:      11.4 mmHg LVOT Vmax:         110.20 cm/s LVOT Vmean:        69.220 cm/s LVOT VTI:          0.274 m LVOT/AV VTI ratio: 0.48 MITRAL VALVE                TRICUSPID VALVE MV Area (PHT): 2.24 cm     TR Peak grad:   48.2 mmHg MV Peak grad:  6.6 mmHg     TR Vmax:        347.00 cm/s MV Mean grad:  2.0 mmHg MV Vmax:       1.28 m/s     SHUNTS MV Vmean:      66.7 cm/s    Systemic VTI:  0.27 m MV Decel Time: 338 msec     Systemic Diam: 2.60 cm MV E velocity: 132.00 cm/s MV A velocity: 111.00 cm/s MV E/A ratio:  1.19  Fransico Him MD Electronically signed by Fransico Him MD Signature Date/Time: 01/31/2020/12:54:21 PM    Final     ASSESSMENT & PLAN:   86 yo with   1) Pancytopenia - unclear etiology Mild normocytic anemia MCV 92 Mild thrombocytopenia 110l Mild leucopenia WNC 3k  2) Recenty Diagnosed 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 PLAN: -Discussed pt labwork today, 02/09/20; of CBC w/diff and CMP is as follows: all values are WNL except for Potassium at 5.4, Glucose at 124, Albumin at 2.8 -Discussed 02/12/20 of CT Head W Wo Contrast ([id number])- no brain metastases -Advised pt that we would try to avoid chemotherapy at this time due to pt's current functional status, anemia, and age. -Advised pt that she has a stage 3-4 Squamous Cell Carcinoma with moderate differentiation.  -Advised pt that this is not a curable disease and that we will be treating in a palliative setting.  -Advised radiation and cancer is causing symptoms like fatigue and lack of appetite  -Advised on chills  -Advised on immunotherapy -waiting for final mutation testing results. -Advised pt that there are immunotherapy and targeted therapy options based on genetics, but would focus on systemic therapies after RT.  -Advised eating well- drinking a supplement drink  -Advised on continue watching blood sugars  -Recommends keeping legs elevated  -Recommends referral to [facility] for nutritional needs  -Recommends stopping metformin  -Recommends using stool softener if pt is not having a bowl movement  -Will prescribe medication (marinol and zofran) -Will get labs today -may need blood transfusion  -Will see back in 2 weeks  FOLLOW UP: Labs today after infusion PRBC transfusion  x 1 unit in 1-2 days Referral to [facility] for severe protein calorie malnutrition RTC with Dr Irene Limbo in 2 weeks with labs  The total time spent in the appt was 30 minutes and more than 50% was  on counseling and direct patient cares.  All of the patient's questions were answered with apparent satisfaction. The patient knows to call the clinic with any problems,  questions or concerns.  Sullivan Lone MD MS AAHIVMS [facility] Hematology/Oncology Physician [facility]  (Office):       [phone] (Work cell):  [phone] (Fax):           [phone]  02/23/2020 10:54 AM  I, Dawayne Cirri am acting as a Education administrator for Dr. Sullivan Lone.   .I have reviewed the above documentation for accuracy and completeness, and I agree with the above. Brunetta Genera MD

## 2020-02-23 ENCOUNTER — Other Ambulatory Visit: Payer: Self-pay

## 2020-02-23 ENCOUNTER — Ambulatory Visit
Admission: RE | Admit: 2020-02-23 | Discharge: 2020-02-23 | Disposition: A | Discharge status: Home / Self Care | Payer: Medicare Other | Source: Ambulatory Visit | Attending: Radiation Oncology | Admitting: Radiation Oncology

## 2020-02-23 ENCOUNTER — Inpatient Hospital Stay (HOSPITAL_BASED_OUTPATIENT_CLINIC_OR_DEPARTMENT_OTHER): Payer: Medicare Other | Admitting: Hematology

## 2020-02-23 ENCOUNTER — Inpatient Hospital Stay: Payer: Medicare Other

## 2020-02-23 VITALS — BP 143/60 | HR 80 | Temp 97.5°F | Resp 19 | Ht 64.0 in | Wt 178.4 lb

## 2020-02-23 DIAGNOSIS — I6320 Cerebral infarction due to unspecified occlusion or stenosis of unspecified precerebral arteries: Secondary | ICD-10-CM

## 2020-02-23 DIAGNOSIS — E538 Deficiency of other specified B group vitamins: Secondary | ICD-10-CM

## 2020-02-23 DIAGNOSIS — R531 Weakness: Secondary | ICD-10-CM | POA: Diagnosis not present

## 2020-02-23 DIAGNOSIS — D509 Iron deficiency anemia, unspecified: Secondary | ICD-10-CM

## 2020-02-23 DIAGNOSIS — D61818 Other pancytopenia: Secondary | ICD-10-CM | POA: Diagnosis not present

## 2020-02-23 DIAGNOSIS — E440 Moderate protein-calorie malnutrition: Secondary | ICD-10-CM

## 2020-02-23 DIAGNOSIS — C3432 Malignant neoplasm of lower lobe, left bronchus or lung: Secondary | ICD-10-CM | POA: Diagnosis not present

## 2020-02-23 DIAGNOSIS — E43 Unspecified severe protein-calorie malnutrition: Secondary | ICD-10-CM | POA: Diagnosis not present

## 2020-02-23 DIAGNOSIS — C3490 Malignant neoplasm of unspecified part of unspecified bronchus or lung: Secondary | ICD-10-CM

## 2020-02-23 DIAGNOSIS — J910 Malignant pleural effusion: Secondary | ICD-10-CM | POA: Diagnosis not present

## 2020-02-23 DIAGNOSIS — E039 Hypothyroidism, unspecified: Secondary | ICD-10-CM | POA: Diagnosis not present

## 2020-02-23 LAB — CBC WITH DIFFERENTIAL/PLATELET
Abs Immature Granulocytes: 0.02 10*3/uL (ref 0.00–0.07)
Basophils Absolute: 0 10*3/uL (ref 0.0–0.1)
Basophils Relative: 1 %
Eosinophils Absolute: 0 10*3/uL (ref 0.0–0.5)
Eosinophils Relative: 1 %
HCT: 31.1 % — ABNORMAL LOW (ref 36.0–46.0)
Hemoglobin: 9.9 g/dL — ABNORMAL LOW (ref 12.0–15.0)
Immature Granulocytes: 0 %
Lymphocytes Relative: 2 %
Lymphs Abs: 0.1 10*3/uL — ABNORMAL LOW (ref 0.7–4.0)
MCH: 28.4 pg (ref 26.0–34.0)
MCHC: 31.8 g/dL (ref 30.0–36.0)
MCV: 89.4 fL (ref 80.0–100.0)
Monocytes Absolute: 0.5 10*3/uL (ref 0.1–1.0)
Monocytes Relative: 9 %
Neutro Abs: 4.4 10*3/uL (ref 1.7–7.7)
Neutrophils Relative %: 87 %
Platelets: 154 10*3/uL (ref 150–400)
RBC: 3.48 MIL/uL — ABNORMAL LOW (ref 3.87–5.11)
RDW: 17.2 % — ABNORMAL HIGH (ref 11.5–15.5)
WBC: 5.1 10*3/uL (ref 4.0–10.5)
nRBC: 0 % (ref 0.0–0.2)

## 2020-02-23 LAB — CMP (CANCER CENTER ONLY)
ALT: 16 U/L (ref 0–44)
AST: 19 U/L (ref 15–41)
Albumin: 2.3 g/dL — ABNORMAL LOW (ref 3.5–5.0)
Alkaline Phosphatase: 92 U/L (ref 38–126)
Anion gap: 10 (ref 5–15)
BUN: 11 mg/dL (ref 8–23)
CO2: 26 mmol/L (ref 22–32)
Calcium: 9.4 mg/dL (ref 8.9–10.3)
Chloride: 95 mmol/L — ABNORMAL LOW (ref 98–111)
Creatinine: 0.68 mg/dL (ref 0.44–1.00)
GFR, Est AFR Am: >60 mL/min (ref >60)
GFR, Estimated: >60 mL/min (ref >60)
Glucose, Bld: 165 mg/dL — ABNORMAL HIGH (ref 70–99)
Potassium: 4.4 mmol/L (ref 3.5–5.1)
Sodium: 131 mmol/L — ABNORMAL LOW (ref 135–145)
Total Bilirubin: 0.4 mg/dL (ref 0.3–1.2)
Total Protein: 6.5 g/dL (ref 6.5–8.1)

## 2020-02-23 LAB — TYPE AND SCREEN
ABO/RH(D): A POS
Antibody Screen: NEGATIVE

## 2020-02-23 MED ORDER — SODIUM CHLORIDE 0.9 % IV SOLN
750.0000 mg | Freq: Once | INTRAVENOUS | Status: AC
  Administered 2020-02-23: 750 mg via INTRAVENOUS
  Filled 2020-02-23: qty 15

## 2020-02-23 MED ORDER — ACETAMINOPHEN 325 MG PO TABS
ORAL_TABLET | ORAL | Status: AC
  Filled 2020-02-23: qty 2

## 2020-02-23 MED ORDER — SODIUM CHLORIDE 0.9 % IV SOLN
Freq: Once | INTRAVENOUS | Status: AC
  Filled 2020-02-23: qty 250

## 2020-02-23 MED ORDER — ACETAMINOPHEN 325 MG PO TABS
650.0000 mg | ORAL_TABLET | Freq: Once | ORAL | Status: AC
  Administered 2020-02-23: 650 mg via ORAL

## 2020-02-23 NOTE — Patient Instructions (Signed)

## 2020-02-25 ENCOUNTER — Ambulatory Visit: Admission: RE | Admit: 2020-02-25 | Payer: Medicare Other | Source: Ambulatory Visit

## 2020-02-26 ENCOUNTER — Other Ambulatory Visit: Payer: Self-pay | Admitting: Hematology

## 2020-02-26 ENCOUNTER — Ambulatory Visit (INDEPENDENT_AMBULATORY_CARE_PROVIDER_SITE_OTHER): Payer: Medicare Other | Admitting: Emergency Medicine

## 2020-02-26 ENCOUNTER — Other Ambulatory Visit: Payer: Self-pay

## 2020-02-26 ENCOUNTER — Ambulatory Visit
Admission: RE | Admit: 2020-02-26 | Discharge: 2020-02-26 | Disposition: A | Discharge status: Home / Self Care | Payer: Medicare Other | Source: Ambulatory Visit | Attending: Radiation Oncology | Admitting: Radiation Oncology

## 2020-02-26 ENCOUNTER — Encounter: Payer: Self-pay | Admitting: Emergency Medicine

## 2020-02-26 DIAGNOSIS — R0600 Dyspnea, unspecified: Secondary | ICD-10-CM | POA: Insufficient documentation

## 2020-02-26 DIAGNOSIS — J90 Pleural effusion, not elsewhere classified: Secondary | ICD-10-CM | POA: Diagnosis not present

## 2020-02-26 DIAGNOSIS — C3432 Malignant neoplasm of lower lobe, left bronchus or lung: Secondary | ICD-10-CM | POA: Diagnosis not present

## 2020-02-26 DIAGNOSIS — R0602 Shortness of breath: Secondary | ICD-10-CM | POA: Diagnosis not present

## 2020-02-26 DIAGNOSIS — I6320 Cerebral infarction due to unspecified occlusion or stenosis of unspecified precerebral arteries: Secondary | ICD-10-CM | POA: Diagnosis not present

## 2020-02-26 MED ORDER — DRONABINOL 2.5 MG PO CAPS: 2.5000 mg | ORAL_CAPSULE | Freq: Two times a day (BID) | ORAL | 0 refills | Status: DC

## 2020-02-26 MED ORDER — ALBUTEROL SULFATE HFA 108 (90 BASE) MCG/ACT IN AERS
2.0000 | INHALATION_SPRAY | Freq: Four times a day (QID) | RESPIRATORY_TRACT | 6 refills | Status: DC | PRN
Start: 2020-02-26 — End: 2020-02-29

## 2020-02-26 MED ORDER — ONDANSETRON HCL 8 MG PO TABS: 8.0000 mg | ORAL_TABLET | Freq: Three times a day (TID) | ORAL | 3 refills | Status: DC | PRN
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
## 2020-02-26 NOTE — Assessment & Plan Note (Signed)
With Pleurx catheter in place.  Her drainage has been decreased over the last 48 hours.  I will have her continue to drain daily/as needed as long as the output is greater than 100 cc.  She will skip a day if the drainage is less than that.  Her drainage may ultimately dwindle as she receives treatment of her underlying malignancy.  If so we could discuss possible removal of the Pleurx.  She has some scabbed areas of prior irritation from her Pleurx dressing.  No evidence of local infection and the current dressing looks unaffected.  Use Neosporin to your skin irritation near your Pleurx dressing We will work on helping you get collection containers and equipment Continue to drain your Pleurx as long as you are getting over 100 cc.  If you have a day where you get less than 100 cc then skip the next day. Please continue to follow with radiation oncology and with oncology Follow with either Dr. Lamonte Sakai or Dr. Valeta Harms in 6 weeks.

## 2020-02-26 NOTE — Patient Instructions (Addendum)
Use Neosporin to your skin irritation near your Pleurx dressing We will work on helping you get collection containers and equipment Continue to drain your Pleurx as long as you are getting over 100 cc.  If you have a day where you get less than 100 cc then skip the next day. Please continue to follow with radiation oncology and with oncology We will try starting albuterol 2 puffs if needed for shortness of breath, spells of coughing. Follow with either Dr. Lamonte Sakai or Dr. Valeta Harms in 6 weeks.

## 2020-02-26 NOTE — Assessment & Plan Note (Signed)
With associated cough.  Likely due to the malignancy but reasonable to do a trial of albuterol to see if she gets benefit.  We will do teaching and obtain albuterol for her today.

## 2020-02-27 ENCOUNTER — Other Ambulatory Visit: Payer: Self-pay

## 2020-02-27 ENCOUNTER — Ambulatory Visit
Admission: RE | Admit: 2020-02-27 | Discharge: 2020-02-27 | Disposition: A | Discharge status: Home / Self Care | Payer: Medicare Other | Source: Ambulatory Visit | Attending: Radiation Oncology | Admitting: Radiation Oncology

## 2020-02-27 DIAGNOSIS — C3432 Malignant neoplasm of lower lobe, left bronchus or lung: Secondary | ICD-10-CM | POA: Diagnosis not present

## 2020-02-28 ENCOUNTER — Ambulatory Visit
Admission: RE | Admit: 2020-02-28 | Discharge: 2020-02-28 | Disposition: A | Discharge status: Home / Self Care | Payer: Medicare Other | Source: Ambulatory Visit | Attending: Radiation Oncology | Admitting: Radiation Oncology

## 2020-02-28 DIAGNOSIS — C3432 Malignant neoplasm of lower lobe, left bronchus or lung: Secondary | ICD-10-CM | POA: Diagnosis not present

## 2020-02-29 ENCOUNTER — Other Ambulatory Visit: Payer: Self-pay

## 2020-02-29 ENCOUNTER — Encounter: Payer: Self-pay | Admitting: Radiation Oncology

## 2020-02-29 ENCOUNTER — Telehealth: Payer: Self-pay | Admitting: Emergency Medicine

## 2020-02-29 ENCOUNTER — Ambulatory Visit
Admission: RE | Admit: 2020-02-29 | Discharge: 2020-02-29 | Disposition: A | Discharge status: Home / Self Care | Payer: Medicare Other | Source: Ambulatory Visit | Attending: Radiation Oncology | Admitting: Radiation Oncology

## 2020-02-29 DIAGNOSIS — Z48813 Encounter for surgical aftercare following surgery on the respiratory system: Secondary | ICD-10-CM | POA: Diagnosis not present

## 2020-02-29 DIAGNOSIS — J449 Chronic obstructive pulmonary disease, unspecified: Secondary | ICD-10-CM | POA: Diagnosis not present

## 2020-02-29 DIAGNOSIS — R918 Other nonspecific abnormal finding of lung field: Secondary | ICD-10-CM | POA: Diagnosis not present

## 2020-02-29 DIAGNOSIS — J90 Pleural effusion, not elsewhere classified: Secondary | ICD-10-CM | POA: Diagnosis not present

## 2020-02-29 DIAGNOSIS — C3432 Malignant neoplasm of lower lobe, left bronchus or lung: Secondary | ICD-10-CM | POA: Diagnosis not present

## 2020-02-29 DIAGNOSIS — I4891 Unspecified atrial fibrillation: Secondary | ICD-10-CM | POA: Diagnosis not present

## 2020-02-29 DIAGNOSIS — E119 Type 2 diabetes mellitus without complications: Secondary | ICD-10-CM | POA: Diagnosis not present

## 2020-02-29 MED ORDER — ALBUTEROL SULFATE HFA 108 (90 BASE) MCG/ACT IN AERS: 2.0000 | INHALATION_SPRAY | Freq: Four times a day (QID) | RESPIRATORY_TRACT | 6 refills | Status: AC | PRN

## 2020-02-29 NOTE — Telephone Encounter (Signed)
Spoke with Linus Orn the home health nurse for patient. She was needing clarification on draining the patients pleurx. Per Dr. Sudie Bailey notes from 02/26/20  With Pleurx catheter in place.  Her drainage has been decreased over the last 48 hours.  I will have her continue to drain daily/as needed as long as the output is greater than 100 cc.  She will skip a day if the drainage is less than that.  Continue to drain your Pleurx as long as you are getting over 100 cc.  If you have a day where you get less than 100 cc then skip the next day. Please continue to follow with radiation oncology and with oncology Follow with either Dr. Lamonte Sakai or Dr. Valeta Harms in 6 weeks.  Was asked to fax this over to nurse. Paperwork was faxed. Nothing further needed at this time.

## 2020-02-29 NOTE — Telephone Encounter (Signed)
Instructions from [place] 7/12 with RB  Use Neosporin to your skin irritation near your Pleurx dressing We will work on helping you get collection containers and equipment Continue to drain your Pleurx as long as you are getting over 100 cc.  If you have a day where you get less than 100 cc then skip the next day. Please continue to follow with radiation oncology and with oncology We will try starting albuterol 2 puffs if needed for shortness of breath, spells of coughing. Follow with either Dr. Lamonte Sakai or Dr. Valeta Harms in 6 weeks.     Rx for albuterol inhaler was sent to [facility] by mail for pt after OV. Called and spoke with pt's daughter [name] letting her know that the Rx was sent there. Asked her if she wanted me to send the Rx for pt to local pharmacy and she verbalized yes. I have sent pt's albuterol inhaler to preferred local pharmacy for pt. Nothing further needed.

## 2020-03-01 ENCOUNTER — Telehealth: Payer: Self-pay | Admitting: Hematology

## 2020-03-01 NOTE — Telephone Encounter (Signed)
Scheduled per 07/09 los, spoke with patient's daughter and patient will be notified of upcoming appointments.

## 2020-03-03 DIAGNOSIS — Z794 Long term (current) use of insulin: Secondary | ICD-10-CM | POA: Diagnosis not present

## 2020-03-03 DIAGNOSIS — Z48813 Encounter for surgical aftercare following surgery on the respiratory system: Secondary | ICD-10-CM | POA: Diagnosis not present

## 2020-03-03 DIAGNOSIS — J449 Chronic obstructive pulmonary disease, unspecified: Secondary | ICD-10-CM | POA: Diagnosis not present

## 2020-03-03 DIAGNOSIS — I10 Essential (primary) hypertension: Secondary | ICD-10-CM | POA: Diagnosis not present

## 2020-03-03 DIAGNOSIS — J90 Pleural effusion, not elsewhere classified: Secondary | ICD-10-CM | POA: Diagnosis not present

## 2020-03-03 DIAGNOSIS — I4891 Unspecified atrial fibrillation: Secondary | ICD-10-CM | POA: Diagnosis not present

## 2020-03-03 DIAGNOSIS — E119 Type 2 diabetes mellitus without complications: Secondary | ICD-10-CM | POA: Diagnosis not present

## 2020-03-03 DIAGNOSIS — I2510 Atherosclerotic heart disease of native coronary artery without angina pectoris: Secondary | ICD-10-CM | POA: Diagnosis not present

## 2020-03-03 DIAGNOSIS — R918 Other nonspecific abnormal finding of lung field: Secondary | ICD-10-CM | POA: Diagnosis not present

## 2020-03-05 ENCOUNTER — Ambulatory Visit: Payer: Medicare Other

## 2020-03-05 ENCOUNTER — Telehealth: Payer: Self-pay | Admitting: *Deleted

## 2020-03-05 DIAGNOSIS — J90 Pleural effusion, not elsewhere classified: Secondary | ICD-10-CM | POA: Diagnosis not present

## 2020-03-05 DIAGNOSIS — I4891 Unspecified atrial fibrillation: Secondary | ICD-10-CM | POA: Diagnosis not present

## 2020-03-05 DIAGNOSIS — R918 Other nonspecific abnormal finding of lung field: Secondary | ICD-10-CM | POA: Diagnosis not present

## 2020-03-05 DIAGNOSIS — Z48813 Encounter for surgical aftercare following surgery on the respiratory system: Secondary | ICD-10-CM | POA: Diagnosis not present

## 2020-03-05 DIAGNOSIS — J449 Chronic obstructive pulmonary disease, unspecified: Secondary | ICD-10-CM | POA: Diagnosis not present

## 2020-03-05 DIAGNOSIS — E119 Type 2 diabetes mellitus without complications: Secondary | ICD-10-CM | POA: Diagnosis not present

## 2020-03-05 NOTE — Telephone Encounter (Signed)
Patient called - states no BM in 5 days - she took po laxative, used suppository and fleets enema without relief. Started Miralax 3 days ago - no response yet. Advised patient it is not advisable to use suppositories or enemas due to potential for injury/infection. Dr. Irene Limbo informed.  Advised using Magnesium Citrate (OTC) - drink 1/2 bottle today and if no response, can drink remainder of bottle in the morning. Asked her to notify office if no results from this. Encouraged continuing Miralax daily.

## 2020-03-06 ENCOUNTER — Inpatient Hospital Stay: Payer: Medicare Other

## 2020-03-06 ENCOUNTER — Other Ambulatory Visit: Payer: Self-pay

## 2020-03-06 ENCOUNTER — Inpatient Hospital Stay (HOSPITAL_BASED_OUTPATIENT_CLINIC_OR_DEPARTMENT_OTHER): Payer: Medicare Other | Admitting: Hematology

## 2020-03-06 ENCOUNTER — Other Ambulatory Visit: Payer: Self-pay | Admitting: *Deleted

## 2020-03-06 VITALS — BP 131/52 | HR 82 | Temp 97.7°F | Resp 18 | Ht 64.0 in | Wt 173.8 lb

## 2020-03-06 DIAGNOSIS — C3490 Malignant neoplasm of unspecified part of unspecified bronchus or lung: Secondary | ICD-10-CM

## 2020-03-06 DIAGNOSIS — C3432 Malignant neoplasm of lower lobe, left bronchus or lung: Secondary | ICD-10-CM | POA: Diagnosis not present

## 2020-03-06 DIAGNOSIS — E43 Unspecified severe protein-calorie malnutrition: Secondary | ICD-10-CM | POA: Diagnosis not present

## 2020-03-06 DIAGNOSIS — D61818 Other pancytopenia: Secondary | ICD-10-CM | POA: Diagnosis not present

## 2020-03-06 DIAGNOSIS — J910 Malignant pleural effusion: Secondary | ICD-10-CM | POA: Diagnosis not present

## 2020-03-06 DIAGNOSIS — D509 Iron deficiency anemia, unspecified: Secondary | ICD-10-CM | POA: Diagnosis not present

## 2020-03-06 DIAGNOSIS — E039 Hypothyroidism, unspecified: Secondary | ICD-10-CM | POA: Diagnosis not present

## 2020-03-06 DIAGNOSIS — I6320 Cerebral infarction due to unspecified occlusion or stenosis of unspecified precerebral arteries: Secondary | ICD-10-CM | POA: Diagnosis not present

## 2020-03-06 DIAGNOSIS — R531 Weakness: Secondary | ICD-10-CM | POA: Diagnosis not present

## 2020-03-06 DIAGNOSIS — E538 Deficiency of other specified B group vitamins: Secondary | ICD-10-CM

## 2020-03-06 LAB — CBC WITH DIFFERENTIAL (CANCER CENTER ONLY)
Abs Immature Granulocytes: 0.04 10*3/uL (ref 0.00–0.07)
Basophils Absolute: 0 10*3/uL (ref 0.0–0.1)
Basophils Relative: 0 %
Eosinophils Absolute: 0.1 10*3/uL (ref 0.0–0.5)
Eosinophils Relative: 1 %
HCT: 32.4 % — ABNORMAL LOW (ref 36.0–46.0)
Hemoglobin: 10.1 g/dL — ABNORMAL LOW (ref 12.0–15.0)
Immature Granulocytes: 1 %
Lymphocytes Relative: 2 %
Lymphs Abs: 0.1 10*3/uL — ABNORMAL LOW (ref 0.7–4.0)
MCH: 29.2 pg (ref 26.0–34.0)
MCHC: 31.2 g/dL (ref 30.0–36.0)
MCV: 93.6 fL (ref 80.0–100.0)
Monocytes Absolute: 0.4 10*3/uL (ref 0.1–1.0)
Monocytes Relative: 9 %
Neutro Abs: 4.5 10*3/uL (ref 1.7–7.7)
Neutrophils Relative %: 87 %
Platelet Count: 124 10*3/uL — ABNORMAL LOW (ref 150–400)
RBC: 3.46 MIL/uL — ABNORMAL LOW (ref 3.87–5.11)
RDW: 19.3 % — ABNORMAL HIGH (ref 11.5–15.5)
WBC Count: 5.1 10*3/uL (ref 4.0–10.5)
nRBC: 0 % (ref 0.0–0.2)

## 2020-03-06 LAB — CMP (CANCER CENTER ONLY)
ALT: 17 U/L (ref 0–44)
AST: 18 U/L (ref 15–41)
Albumin: 2.4 g/dL — ABNORMAL LOW (ref 3.5–5.0)
Alkaline Phosphatase: 94 U/L (ref 38–126)
Anion gap: 7 (ref 5–15)
BUN: 15 mg/dL (ref 8–23)
CO2: 30 mmol/L (ref 22–32)
Calcium: 9.8 mg/dL (ref 8.9–10.3)
Chloride: 97 mmol/L — ABNORMAL LOW (ref 98–111)
Creatinine: 0.75 mg/dL (ref 0.44–1.00)
GFR, Est AFR Am: >60 mL/min (ref >60)
GFR, Estimated: >60 mL/min (ref >60)
Glucose, Bld: 136 mg/dL — ABNORMAL HIGH (ref 70–99)
Potassium: 5 mmol/L (ref 3.5–5.1)
Sodium: 134 mmol/L — ABNORMAL LOW (ref 135–145)
Total Bilirubin: 0.3 mg/dL (ref 0.3–1.2)
Total Protein: 6.4 g/dL — ABNORMAL LOW (ref 6.5–8.1)

## 2020-03-06 MED ORDER — CYANOCOBALAMIN 1000 MCG/ML IJ SOLN
1000.0000 ug | Freq: Once | INTRAMUSCULAR | Status: AC
  Administered 2020-03-06: 1000 ug via SUBCUTANEOUS

## 2020-03-06 NOTE — Patient Instructions (Signed)

## 2020-03-06 NOTE — Progress Notes (Signed)
  Patient Name: Hailey Cobb MRN: [id number] DOB: Jan 03, 1932 Referring Physician: Hulan Fess (Profile Not Attached) Date of Service: 02/29/2020 [facility] [place]                                                        End Of Treatment Note  Diagnoses: C34.32-Malignant neoplasm of lower lobe, left bronchus or lung  Cancer Staging: Clinical stage IB squamous carcinoma  Intent: Palliative  Radiation Treatment Dates: 02/15/2020 through 02/29/2020 Site Technique Total Dose (Gy) Dose per Fx (Gy) Completed Fx Beam Energies  Lung, Left: Lung_Lt_Lower IMRT 50/50 5 10/10 6XFFF   Narrative: The patient tolerated radiation therapy relatively well. She did report some right hip pain at the beginning, poor appetite, shortness of breath when sitting and with exertion, frequent productive cough with yellow sputum, mild fatigue, and difficulty swallowing food after coughing episodes. Long-acting cough medicine had helped with the cough so that the patient was able to sleep at night. She denied hemoptysis., skin changes, and painful swallowing.  Of note, the patient was noted to have decreased breath sounds in the left lower lung field throughout treatment. She had also noticed serosanguineous drainage from her left chest tube, which had increased to 250 cc in a 24 hour period. The patient's daughter had contacted cardiothoracic surgery regarding that.  Plan: The patient will follow-up with radiation oncology in one month.  ________________________________________________   Blair Promise, PhD, MD  This document serves as a record of services personally performed by Gery Pray, MD. It was created on his behalf by Clerance Lav, a trained medical scribe. The creation of this record is based on the scribe's personal observations and the  provider's statements to them. This document has been checked and approved by the attending provider.
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
## 2020-03-08 ENCOUNTER — Telehealth: Payer: Self-pay | Admitting: Hematology

## 2020-03-08 DIAGNOSIS — I4891 Unspecified atrial fibrillation: Secondary | ICD-10-CM | POA: Diagnosis not present

## 2020-03-08 DIAGNOSIS — Z48813 Encounter for surgical aftercare following surgery on the respiratory system: Secondary | ICD-10-CM | POA: Diagnosis not present

## 2020-03-08 DIAGNOSIS — J449 Chronic obstructive pulmonary disease, unspecified: Secondary | ICD-10-CM | POA: Diagnosis not present

## 2020-03-08 DIAGNOSIS — E119 Type 2 diabetes mellitus without complications: Secondary | ICD-10-CM | POA: Diagnosis not present

## 2020-03-08 DIAGNOSIS — R918 Other nonspecific abnormal finding of lung field: Secondary | ICD-10-CM | POA: Diagnosis not present

## 2020-03-08 DIAGNOSIS — J90 Pleural effusion, not elsewhere classified: Secondary | ICD-10-CM | POA: Diagnosis not present

## 2020-03-08 NOTE — Telephone Encounter (Signed)
Scheduled per 07/21 los, called and spoke with patient's daughter. Patient will be notified of upcoming appointments.

## 2020-03-12 DIAGNOSIS — Z48813 Encounter for surgical aftercare following surgery on the respiratory system: Secondary | ICD-10-CM | POA: Diagnosis not present

## 2020-03-12 DIAGNOSIS — J449 Chronic obstructive pulmonary disease, unspecified: Secondary | ICD-10-CM | POA: Diagnosis not present

## 2020-03-12 DIAGNOSIS — J90 Pleural effusion, not elsewhere classified: Secondary | ICD-10-CM | POA: Diagnosis not present

## 2020-03-12 DIAGNOSIS — E119 Type 2 diabetes mellitus without complications: Secondary | ICD-10-CM | POA: Diagnosis not present

## 2020-03-12 DIAGNOSIS — R918 Other nonspecific abnormal finding of lung field: Secondary | ICD-10-CM | POA: Diagnosis not present

## 2020-03-12 DIAGNOSIS — I4891 Unspecified atrial fibrillation: Secondary | ICD-10-CM | POA: Diagnosis not present

## 2020-03-13 ENCOUNTER — Other Ambulatory Visit: Payer: Self-pay

## 2020-03-13 ENCOUNTER — Encounter: Payer: Self-pay | Admitting: Nutrition

## 2020-03-13 ENCOUNTER — Inpatient Hospital Stay: Payer: Medicare Other | Admitting: Nutrition

## 2020-03-13 NOTE — Progress Notes (Signed)
86 year old female diagnosed with lung cancer followed by Dr. Irene Limbo.  Past medical history includes seizures, esophageal stricture, IBS, hypothyroidism, hypertension, hypercholesterolemia, GERD, diabetes, CHF, and CAD.  Medications include Xanax, vitamin B12, Marinol, Lasix, Synthroid, Glucophage, Zofran, and Januvia.  Labs include sodium 134, glucose 136, and albumin 2.4.  Height: 5 feet 4 inches. Weight: 170 pounds. Usual body weight: 181 pounds in June 2021. BMI: 29.18.  Patient reports her appetite has decreased but she is trying to take Marinol.  She states she starts to feel hungry later on in the afternoon. Reports she has nausea every day.  She has not been taking nausea medication. Reports a history of constipation and is trying to manage so that she does not become severely constipated. Reports she is chewing and swallowing without difficulty. She has been drinking at least 1 Glucerna every day.  Nutrition diagnosis: Unintended weight loss related to cancer as evidenced by 6% weight loss in less than 3 months.  Intervention: Educated patient on importance of taking nausea medication as prescribed to minimize nausea. Continue Marinol per MD for increased appetite. Recommended small frequent meals and snacks. Reviewed high-protein foods. Encouraged bowel regimen. Recommended Glucerna twice daily between meals.  Samples were given. Provided fact sheets.  Questions were answered.  Teach back method used.  Contact information given.  Monitoring, evaluation, goals: Patient will tolerate increased calories and protein to promote weight maintenance.  Next visit: Thursday, August 19 during infusion.  **Disclaimer: This note was dictated with voice recognition software. Similar sounding words can inadvertently be transcribed and this note may contain transcription errors which may not have been corrected upon publication of note.**

## 2020-03-14 ENCOUNTER — Inpatient Hospital Stay: Payer: Medicare Other

## 2020-03-14 ENCOUNTER — Telehealth: Payer: Self-pay | Admitting: *Deleted

## 2020-03-14 ENCOUNTER — Other Ambulatory Visit: Payer: Self-pay | Admitting: *Deleted

## 2020-03-14 ENCOUNTER — Other Ambulatory Visit: Payer: Self-pay | Admitting: Oncology

## 2020-03-14 ENCOUNTER — Other Ambulatory Visit: Payer: Self-pay | Admitting: Hematology

## 2020-03-14 ENCOUNTER — Other Ambulatory Visit: Payer: Self-pay | Admitting: Internal Medicine

## 2020-03-14 ENCOUNTER — Other Ambulatory Visit (HOSPITAL_BASED_OUTPATIENT_CLINIC_OR_DEPARTMENT_OTHER): Payer: Self-pay | Admitting: Oncology

## 2020-03-14 DIAGNOSIS — C3490 Malignant neoplasm of unspecified part of unspecified bronchus or lung: Secondary | ICD-10-CM

## 2020-03-14 DIAGNOSIS — C3432 Malignant neoplasm of lower lobe, left bronchus or lung: Secondary | ICD-10-CM | POA: Diagnosis not present

## 2020-03-14 DIAGNOSIS — Z7189 Other specified counseling: Secondary | ICD-10-CM

## 2020-03-14 DIAGNOSIS — J910 Malignant pleural effusion: Secondary | ICD-10-CM | POA: Diagnosis not present

## 2020-03-14 DIAGNOSIS — E43 Unspecified severe protein-calorie malnutrition: Secondary | ICD-10-CM | POA: Diagnosis not present

## 2020-03-14 DIAGNOSIS — D61818 Other pancytopenia: Secondary | ICD-10-CM | POA: Diagnosis not present

## 2020-03-14 DIAGNOSIS — R531 Weakness: Secondary | ICD-10-CM | POA: Diagnosis not present

## 2020-03-14 DIAGNOSIS — E039 Hypothyroidism, unspecified: Secondary | ICD-10-CM | POA: Diagnosis not present

## 2020-03-14 LAB — CBC WITH DIFFERENTIAL (CANCER CENTER ONLY)
Abs Immature Granulocytes: 0.01 10*3/uL (ref 0.00–0.07)
Basophils Absolute: 0 10*3/uL (ref 0.0–0.1)
Basophils Relative: 1 %
Eosinophils Absolute: 0.1 10*3/uL (ref 0.0–0.5)
Eosinophils Relative: 1 %
HCT: 32.8 % — ABNORMAL LOW (ref 36.0–46.0)
Hemoglobin: 10.4 g/dL — ABNORMAL LOW (ref 12.0–15.0)
Immature Granulocytes: 0 %
Lymphocytes Relative: 3 %
Lymphs Abs: 0.1 10*3/uL — ABNORMAL LOW (ref 0.7–4.0)
MCH: 28.6 pg (ref 26.0–34.0)
MCHC: 31.7 g/dL (ref 30.0–36.0)
MCV: 90.1 fL (ref 80.0–100.0)
Monocytes Absolute: 0.3 10*3/uL (ref 0.1–1.0)
Monocytes Relative: 9 %
Neutro Abs: 3.2 10*3/uL (ref 1.7–7.7)
Neutrophils Relative %: 86 %
Platelet Count: 89 10*3/uL — ABNORMAL LOW (ref 150–400)
RBC: 3.64 MIL/uL — ABNORMAL LOW (ref 3.87–5.11)
RDW: 19.7 % — ABNORMAL HIGH (ref 11.5–15.5)
WBC Count: 3.7 10*3/uL — ABNORMAL LOW (ref 4.0–10.5)
nRBC: 0 % (ref 0.0–0.2)

## 2020-03-14 LAB — CMP (CANCER CENTER ONLY)
ALT: 14 U/L (ref 0–44)
AST: 17 U/L (ref 15–41)
Albumin: 2.6 g/dL — ABNORMAL LOW (ref 3.5–5.0)
Alkaline Phosphatase: 104 U/L (ref 38–126)
Anion gap: 9 (ref 5–15)
BUN: 13 mg/dL (ref 8–23)
CO2: 24 mmol/L (ref 22–32)
Calcium: 10.5 mg/dL — ABNORMAL HIGH (ref 8.9–10.3)
Chloride: 98 mmol/L (ref 98–111)
Creatinine: 0.78 mg/dL (ref 0.44–1.00)
GFR, Est AFR Am: >60 mL/min (ref >60)
GFR, Estimated: >60 mL/min (ref >60)
Glucose, Bld: 188 mg/dL — ABNORMAL HIGH (ref 70–99)
Potassium: 4.8 mmol/L (ref 3.5–5.1)
Sodium: 131 mmol/L — ABNORMAL LOW (ref 135–145)
Total Bilirubin: 0.3 mg/dL (ref 0.3–1.2)
Total Protein: 6.6 g/dL (ref 6.5–8.1)

## 2020-03-14 MED ORDER — DRONABINOL 2.5 MG PO CAPS: 2.5000 mg | ORAL_CAPSULE | Freq: Two times a day (BID) | ORAL | 0 refills | Status: DC

## 2020-03-14 MED ORDER — ONDANSETRON HCL 8 MG PO TABS: 8.0000 mg | ORAL_TABLET | Freq: Three times a day (TID) | ORAL | 3 refills | Status: DC | PRN

## 2020-03-14 NOTE — Telephone Encounter (Signed)
Patient requested 2 medications be sent to [facility]: Zofran and Marinol. Zofran RX sent to [place], [place]. Message forwarded to Dr. Irene Limbo regarding Marinol.

## 2020-03-14 NOTE — Telephone Encounter (Signed)
RETURNED PATIENT'S PHONE CALL, LVM FOR A RETURN CALL 

## 2020-03-14 NOTE — Progress Notes (Signed)
START OFF PATHWAY REGIMEN - Non-Small Cell Lung   [id number]:Atezolizumab 1,200 mg q21 Days:   A cycle is every 21 days:     Atezolizumab   **Always confirm dose/schedule in your pharmacy ordering system**  Patient Characteristics: Stage IV Metastatic, Squamous, PS = 2, First Line, PD-L1 Expression Positive 1-49% (TPS) / Negative / Not Tested / Awaiting Test Results Therapeutic Status: Stage IV Metastatic Histology: Squamous Cell Line of therapy: First Line ECOG Performance Status: 2 PD-L1 Expression Status: Awaiting Test Results  Intent of Therapy: Non-Curative / Palliative Intent, Discussed with Patient

## 2020-03-14 NOTE — Progress Notes (Signed)
I saw Ms. Hailey Cobb in the "blue room" waiting area.  She had already been delayed because of the lab issues and then going through chemotherapy teaching.  Her chemotherapy orders are not in.  Dr. Earlie Server was consulted as to whether he would be comfortable putting the man and he was not.  I was then asked as the on-call physician.  This is a patient that I do not now and it is not a regimented for this particular cancer that I have used before.  I do not know if Dr. Jenell Milliner intended to make some adjustments or had some special premed she wanted to use your insured how he intended to tailor this treatment to this patient.  Accordingly I think the safest thing to do is to reschedule the treatment.  I apologized to the patient, explained this was really our problem and not first, offered to get her home in a taxi that we would pay for (however she distrusts taxis) and I have arranged to have her treatment rescheduled for next week at a time when her daughter will be able to bring her

## 2020-03-15 ENCOUNTER — Telehealth: Payer: Self-pay | Admitting: Hematology

## 2020-03-15 ENCOUNTER — Telehealth: Payer: Self-pay | Admitting: *Deleted

## 2020-03-15 DIAGNOSIS — R918 Other nonspecific abnormal finding of lung field: Secondary | ICD-10-CM | POA: Diagnosis not present

## 2020-03-15 DIAGNOSIS — J449 Chronic obstructive pulmonary disease, unspecified: Secondary | ICD-10-CM | POA: Diagnosis not present

## 2020-03-15 DIAGNOSIS — I4891 Unspecified atrial fibrillation: Secondary | ICD-10-CM | POA: Diagnosis not present

## 2020-03-15 DIAGNOSIS — Z48813 Encounter for surgical aftercare following surgery on the respiratory system: Secondary | ICD-10-CM | POA: Diagnosis not present

## 2020-03-15 DIAGNOSIS — E119 Type 2 diabetes mellitus without complications: Secondary | ICD-10-CM | POA: Diagnosis not present

## 2020-03-15 DIAGNOSIS — J90 Pleural effusion, not elsewhere classified: Secondary | ICD-10-CM | POA: Diagnosis not present

## 2020-03-15 NOTE — Telephone Encounter (Signed)
Returned patient's daughter's phone call, spoke with Roque Cash

## 2020-03-15 NOTE — Telephone Encounter (Signed)
R/s appt per 7/29 sch msg - pt is aware.

## 2020-03-19 ENCOUNTER — Inpatient Hospital Stay: Payer: Medicare Other | Attending: Hematology

## 2020-03-19 ENCOUNTER — Other Ambulatory Visit: Payer: Self-pay

## 2020-03-19 VITALS — BP 120/55 | HR 74 | Temp 98.0°F | Resp 18

## 2020-03-19 DIAGNOSIS — Z5112 Encounter for antineoplastic immunotherapy: Secondary | ICD-10-CM | POA: Diagnosis not present

## 2020-03-19 DIAGNOSIS — C3432 Malignant neoplasm of lower lobe, left bronchus or lung: Secondary | ICD-10-CM | POA: Insufficient documentation

## 2020-03-19 DIAGNOSIS — D61818 Other pancytopenia: Secondary | ICD-10-CM | POA: Insufficient documentation

## 2020-03-19 DIAGNOSIS — Z7189 Other specified counseling: Secondary | ICD-10-CM

## 2020-03-19 MED ORDER — SODIUM CHLORIDE 0.9 % IV SOLN
Freq: Once | INTRAVENOUS | Status: AC
  Filled 2020-03-19: qty 250

## 2020-03-19 MED ORDER — SODIUM CHLORIDE 0.9 % IV SOLN
1200.0000 mg | Freq: Once | INTRAVENOUS | Status: AC
  Administered 2020-03-19: 1200 mg via INTRAVENOUS
  Filled 2020-03-19: qty 20

## 2020-03-19 NOTE — Progress Notes (Signed)
Platelets 89 on 03/14/20, ok to treat per Dr. Irene Limbo.

## 2020-03-19 NOTE — Patient Instructions (Signed)
[facility] Discharge Instructions for Patients Receiving Chemotherapy  Today you received the following immunotherapy agent Tecentriq  To help prevent nausea and vomiting after your treatment, we encourage you to take your nausea medication as prescribed.   If you develop nausea and vomiting that is not controlled by your nausea medication, call the clinic.   BELOW ARE SYMPTOMS THAT SHOULD BE REPORTED IMMEDIATELY:  *FEVER GREATER THAN 100.5 F  *CHILLS WITH OR WITHOUT FEVER  NAUSEA AND VOMITING THAT IS NOT CONTROLLED WITH YOUR NAUSEA MEDICATION  *UNUSUAL SHORTNESS OF BREATH  *UNUSUAL BRUISING OR BLEEDING  TENDERNESS IN MOUTH AND THROAT WITH OR WITHOUT PRESENCE OF ULCERS  *URINARY PROBLEMS  *BOWEL PROBLEMS  UNUSUAL RASH Items with * indicate a potential emergency and should be followed up as soon as possible.  Feel free to call the clinic should you have any questions or concerns. The clinic phone number is [phone].  Please show the [facility] at check-in to the Emergency Department and triage nurse.  Atezolizumab injection What is this medicine? ATEZOLIZUMAB (a te zoe LIZ ue mab) is a monoclonal antibody. It is used to treat bladder cancer (urothelial cancer), liver cancer, lung cancer, breast cancer, and melanoma. This medicine may be used for other purposes; ask your health care provider or pharmacist if you have questions. COMMON BRAND NAME(S): Tecentriq What should I tell my health care provider before I take this medicine? They need to know if you have any of these conditions:  diabetes  immune system problems  infection  inflammatory bowel disease  liver disease  lung or breathing disease  lupus  nervous system problems like myasthenia gravis or Guillain-Barre syndrome  organ transplant  an unusual or allergic reaction to atezolizumab, other medicines, foods, dyes, or preservatives  pregnant or trying to get  pregnant  breast-feeding How should I use this medicine? This medicine is for infusion into a vein. It is given by a health care professional in a hospital or clinic setting. A special MedGuide will be given to you before each treatment. Be sure to read this information carefully each time. Talk to your pediatrician regarding the use of this medicine in children. Special care may be needed. Overdosage: If you think you have taken too much of this medicine contact a poison control center or emergency room at once. NOTE: This medicine is only for you. Do not share this medicine with others. What if I miss a dose? It is important not to miss your dose. Call your doctor or health care professional if you are unable to keep an appointment. What may interact with this medicine? Interactions have not been studied. This list may not describe all possible interactions. Give your health care provider a list of all the medicines, herbs, non-prescription drugs, or dietary supplements you use. Also tell them if you smoke, drink alcohol, or use illegal drugs. Some items may interact with your medicine. What should I watch for while using this medicine? Your condition will be monitored carefully while you are receiving this medicine. You may need blood work done while you are taking this medicine. Do not become pregnant while taking this medicine or for at least 5 months after stopping it. Women should inform their doctor if they wish to become pregnant or think they might be pregnant. There is a potential for serious side effects to an unborn child. Talk to your health care professional or pharmacist for more information. Do not breast-feed an infant while  taking this medicine or for at least 5 months after the last dose. What side effects may I notice from receiving this medicine? Side effects that you should report to your doctor or health care professional as soon as possible:  allergic reactions like skin  rash, itching or hives, swelling of the face, lips, or tongue  black, tarry stools  bloody or watery diarrhea  breathing problems  changes in vision  chest pain or chest tightness  chills  facial flushing  fever  headache  signs and symptoms of high blood sugar such as dizziness; dry mouth; dry skin; fruity breath; nausea; stomach pain; increased hunger or thirst; increased urination  signs and symptoms of liver injury like dark yellow or brown urine; general ill feeling or flu-like symptoms; light-colored stools; loss of appetite; nausea; right upper belly pain; unusually weak or tired; yellowing of the eyes or skin  stomach pain  trouble passing urine or change in the amount of urine Side effects that usually do not require medical attention (report to your doctor or health care professional if they continue or are bothersome):  bone pain  cough  diarrhea  joint pain  muscle pain  muscle weakness  swelling of arms or legs  tiredness  weight loss This list may not describe all possible side effects. Call your doctor for medical advice about side effects. You may report side effects to FDA at 1-800-FDA-1088. Where should I keep my medicine? This drug is given in a hospital or clinic and will not be stored at home. NOTE: This sheet is a summary. It may not cover all possible information. If you have questions about this medicine, talk to your doctor, pharmacist, or health care provider.  2020 [employer] Standard (2019-03-24 13:11:14)

## 2020-03-20 DIAGNOSIS — J449 Chronic obstructive pulmonary disease, unspecified: Secondary | ICD-10-CM | POA: Diagnosis not present

## 2020-03-20 DIAGNOSIS — Z48813 Encounter for surgical aftercare following surgery on the respiratory system: Secondary | ICD-10-CM | POA: Diagnosis not present

## 2020-03-20 DIAGNOSIS — I4891 Unspecified atrial fibrillation: Secondary | ICD-10-CM | POA: Diagnosis not present

## 2020-03-20 DIAGNOSIS — E119 Type 2 diabetes mellitus without complications: Secondary | ICD-10-CM | POA: Diagnosis not present

## 2020-03-20 DIAGNOSIS — J90 Pleural effusion, not elsewhere classified: Secondary | ICD-10-CM | POA: Diagnosis not present

## 2020-03-20 DIAGNOSIS — R918 Other nonspecific abnormal finding of lung field: Secondary | ICD-10-CM | POA: Diagnosis not present
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
## 2020-03-21 ENCOUNTER — Other Ambulatory Visit: Payer: Self-pay

## 2020-03-21 DIAGNOSIS — Z48813 Encounter for surgical aftercare following surgery on the respiratory system: Secondary | ICD-10-CM | POA: Diagnosis not present

## 2020-03-21 DIAGNOSIS — J90 Pleural effusion, not elsewhere classified: Secondary | ICD-10-CM | POA: Diagnosis not present

## 2020-03-21 DIAGNOSIS — R918 Other nonspecific abnormal finding of lung field: Secondary | ICD-10-CM | POA: Diagnosis not present

## 2020-03-21 DIAGNOSIS — I4891 Unspecified atrial fibrillation: Secondary | ICD-10-CM | POA: Diagnosis not present

## 2020-03-21 DIAGNOSIS — E119 Type 2 diabetes mellitus without complications: Secondary | ICD-10-CM | POA: Diagnosis not present

## 2020-03-21 DIAGNOSIS — J449 Chronic obstructive pulmonary disease, unspecified: Secondary | ICD-10-CM | POA: Diagnosis not present

## 2020-03-21 MED ORDER — FUROSEMIDE 20 MG PO TABS: 20.0000 mg | ORAL_TABLET | ORAL | 1 refills | Status: AC

## 2020-03-22 ENCOUNTER — Other Ambulatory Visit: Payer: Self-pay | Admitting: *Deleted

## 2020-03-22 DIAGNOSIS — I4891 Unspecified atrial fibrillation: Secondary | ICD-10-CM | POA: Diagnosis not present

## 2020-03-22 DIAGNOSIS — Z48813 Encounter for surgical aftercare following surgery on the respiratory system: Secondary | ICD-10-CM | POA: Diagnosis not present

## 2020-03-22 DIAGNOSIS — J90 Pleural effusion, not elsewhere classified: Secondary | ICD-10-CM | POA: Diagnosis not present

## 2020-03-22 DIAGNOSIS — D509 Iron deficiency anemia, unspecified: Secondary | ICD-10-CM

## 2020-03-22 DIAGNOSIS — J449 Chronic obstructive pulmonary disease, unspecified: Secondary | ICD-10-CM | POA: Diagnosis not present

## 2020-03-22 DIAGNOSIS — E119 Type 2 diabetes mellitus without complications: Secondary | ICD-10-CM | POA: Diagnosis not present

## 2020-03-22 DIAGNOSIS — R918 Other nonspecific abnormal finding of lung field: Secondary | ICD-10-CM | POA: Diagnosis not present

## 2020-03-22 DIAGNOSIS — C3432 Malignant neoplasm of lower lobe, left bronchus or lung: Secondary | ICD-10-CM

## 2020-03-25 ENCOUNTER — Telehealth: Payer: Self-pay | Admitting: Physician Assistant

## 2020-03-25 ENCOUNTER — Other Ambulatory Visit: Payer: Self-pay

## 2020-03-25 ENCOUNTER — Inpatient Hospital Stay (HOSPITAL_BASED_OUTPATIENT_CLINIC_OR_DEPARTMENT_OTHER): Payer: Medicare Other | Admitting: Hematology

## 2020-03-25 ENCOUNTER — Inpatient Hospital Stay: Payer: Medicare Other

## 2020-03-25 DIAGNOSIS — R059 Cough, unspecified: Secondary | ICD-10-CM

## 2020-03-25 DIAGNOSIS — C3490 Malignant neoplasm of unspecified part of unspecified bronchus or lung: Secondary | ICD-10-CM | POA: Diagnosis not present

## 2020-03-25 DIAGNOSIS — D509 Iron deficiency anemia, unspecified: Secondary | ICD-10-CM

## 2020-03-25 DIAGNOSIS — C3432 Malignant neoplasm of lower lobe, left bronchus or lung: Secondary | ICD-10-CM | POA: Diagnosis not present

## 2020-03-25 DIAGNOSIS — D61818 Other pancytopenia: Secondary | ICD-10-CM | POA: Diagnosis not present

## 2020-03-25 DIAGNOSIS — R05 Cough: Secondary | ICD-10-CM | POA: Diagnosis not present

## 2020-03-25 DIAGNOSIS — I6320 Cerebral infarction due to unspecified occlusion or stenosis of unspecified precerebral arteries: Secondary | ICD-10-CM | POA: Diagnosis not present

## 2020-03-25 DIAGNOSIS — Z5112 Encounter for antineoplastic immunotherapy: Secondary | ICD-10-CM | POA: Diagnosis not present

## 2020-03-25 LAB — CMP (CANCER CENTER ONLY)
ALT: 14 U/L (ref 0–44)
AST: 17 U/L (ref 15–41)
Albumin: 2.5 g/dL — ABNORMAL LOW (ref 3.5–5.0)
Alkaline Phosphatase: 94 U/L (ref 38–126)
Anion gap: 8 (ref 5–15)
BUN: 19 mg/dL (ref 8–23)
CO2: 28 mmol/L (ref 22–32)
Calcium: 10.2 mg/dL (ref 8.9–10.3)
Chloride: 98 mmol/L (ref 98–111)
Creatinine: 0.84 mg/dL (ref 0.44–1.00)
GFR, Est AFR Am: >60 mL/min (ref >60)
GFR, Estimated: >60 mL/min (ref >60)
Glucose, Bld: 209 mg/dL — ABNORMAL HIGH (ref 70–99)
Potassium: 4.3 mmol/L (ref 3.5–5.1)
Sodium: 134 mmol/L — ABNORMAL LOW (ref 135–145)
Total Bilirubin: 0.2 mg/dL — ABNORMAL LOW (ref 0.3–1.2)
Total Protein: 6.6 g/dL (ref 6.5–8.1)

## 2020-03-25 LAB — CBC WITH DIFFERENTIAL (CANCER CENTER ONLY)
Abs Immature Granulocytes: 0.03 10*3/uL (ref 0.00–0.07)
Basophils Absolute: 0 10*3/uL (ref 0.0–0.1)
Basophils Relative: 0 %
Eosinophils Absolute: 0.1 10*3/uL (ref 0.0–0.5)
Eosinophils Relative: 2 %
HCT: 32.4 % — ABNORMAL LOW (ref 36.0–46.0)
Hemoglobin: 10.2 g/dL — ABNORMAL LOW (ref 12.0–15.0)
Immature Granulocytes: 1 %
Lymphocytes Relative: 4 %
Lymphs Abs: 0.2 10*3/uL — ABNORMAL LOW (ref 0.7–4.0)
MCH: 28.9 pg (ref 26.0–34.0)
MCHC: 31.5 g/dL (ref 30.0–36.0)
MCV: 91.8 fL (ref 80.0–100.0)
Monocytes Absolute: 0.5 10*3/uL (ref 0.1–1.0)
Monocytes Relative: 11 %
Neutro Abs: 3.3 10*3/uL (ref 1.7–7.7)
Neutrophils Relative %: 82 %
Platelet Count: 95 10*3/uL — ABNORMAL LOW (ref 150–400)
RBC: 3.53 MIL/uL — ABNORMAL LOW (ref 3.87–5.11)
RDW: 19.8 % — ABNORMAL HIGH (ref 11.5–15.5)
WBC Count: 4 10*3/uL (ref 4.0–10.5)
nRBC: 0 % (ref 0.0–0.2)

## 2020-03-25 LAB — SAMPLE TO BLOOD BANK

## 2020-03-25 MED ORDER — ONDANSETRON HCL 8 MG PO TABS: 8.0000 mg | ORAL_TABLET | Freq: Three times a day (TID) | ORAL | 3 refills | Status: AC | PRN

## 2020-03-25 MED ORDER — SENNOSIDES-DOCUSATE SODIUM 8.6-50 MG PO TABS
2.0000 | ORAL_TABLET | Freq: Every day | ORAL | 1 refills | Status: AC
Start: 2020-03-25 — End: ?

## 2020-03-25 MED ORDER — BENZONATATE 100 MG PO CAPS: 200.0000 mg | ORAL_CAPSULE | Freq: Three times a day (TID) | ORAL | 3 refills | Status: AC | PRN

## 2020-03-25 MED ORDER — DRONABINOL 2.5 MG PO CAPS: 2.5000 mg | ORAL_CAPSULE | Freq: Two times a day (BID) | ORAL | 0 refills | Status: DC

## 2020-03-25 MED ORDER — HYDROCOD POLST-CPM POLST ER 10-8 MG/5ML PO SUER: 5.0000 mL | Freq: Every evening | ORAL | 0 refills | Status: DC | PRN

## 2020-03-25 NOTE — Telephone Encounter (Signed)
  [place] VALVE TEAM   Patient due for her 1 year follow up and echo next month. I called patient to set up these appointments. She informed me that she has recently been diagnosed with lung cancer with a poor prognosis. She has not decided whether or not she will proceed with chemo/XRT or palliative care. She does not want to consider coming in for TAVR follow up at this time. I will check back in next month.   Angelena Form PA-C  [facility]

## 2020-03-26 ENCOUNTER — Telehealth: Payer: Self-pay | Admitting: Hematology

## 2020-03-26 DIAGNOSIS — R918 Other nonspecific abnormal finding of lung field: Secondary | ICD-10-CM | POA: Diagnosis not present

## 2020-03-26 DIAGNOSIS — I4891 Unspecified atrial fibrillation: Secondary | ICD-10-CM | POA: Diagnosis not present

## 2020-03-26 DIAGNOSIS — Z48813 Encounter for surgical aftercare following surgery on the respiratory system: Secondary | ICD-10-CM | POA: Diagnosis not present

## 2020-03-26 DIAGNOSIS — J90 Pleural effusion, not elsewhere classified: Secondary | ICD-10-CM | POA: Diagnosis not present

## 2020-03-26 DIAGNOSIS — E119 Type 2 diabetes mellitus without complications: Secondary | ICD-10-CM | POA: Diagnosis not present

## 2020-03-26 DIAGNOSIS — J449 Chronic obstructive pulmonary disease, unspecified: Secondary | ICD-10-CM | POA: Diagnosis not present

## 2020-03-26 NOTE — Telephone Encounter (Signed)
Cancelled all future appointments per 08/09 los.

## 2020-03-27 ENCOUNTER — Telehealth: Payer: Self-pay | Admitting: Emergency Medicine

## 2020-03-27 ENCOUNTER — Telehealth: Payer: Self-pay

## 2020-03-27 DIAGNOSIS — Z48813 Encounter for surgical aftercare following surgery on the respiratory system: Secondary | ICD-10-CM | POA: Diagnosis not present

## 2020-03-27 DIAGNOSIS — E119 Type 2 diabetes mellitus without complications: Secondary | ICD-10-CM | POA: Diagnosis not present

## 2020-03-27 DIAGNOSIS — R918 Other nonspecific abnormal finding of lung field: Secondary | ICD-10-CM | POA: Diagnosis not present

## 2020-03-27 DIAGNOSIS — J449 Chronic obstructive pulmonary disease, unspecified: Secondary | ICD-10-CM | POA: Diagnosis not present

## 2020-03-27 DIAGNOSIS — I4891 Unspecified atrial fibrillation: Secondary | ICD-10-CM | POA: Diagnosis not present

## 2020-03-27 DIAGNOSIS — J90 Pleural effusion, not elsewhere classified: Secondary | ICD-10-CM | POA: Diagnosis not present

## 2020-03-27 NOTE — Telephone Encounter (Signed)
Spoke with the pt's daughter  She states that since the time of her call she has been in contact with [facility] and they are going to deliver 2 containers today  Nothing further needed

## 2020-03-27 NOTE — Telephone Encounter (Signed)
Received call from daughter requesting appt. to see Dr. Irene Limbo again because her mom still has questions and needs to know her options or be referred for a second opinion.   Per Dr. Irene Limbo: will try to call tommorrow on the phone or early next week.

## 2020-03-28 ENCOUNTER — Other Ambulatory Visit: Payer: Self-pay | Admitting: Hematology

## 2020-03-28 DIAGNOSIS — J449 Chronic obstructive pulmonary disease, unspecified: Secondary | ICD-10-CM | POA: Diagnosis not present

## 2020-03-28 DIAGNOSIS — J90 Pleural effusion, not elsewhere classified: Secondary | ICD-10-CM | POA: Diagnosis not present

## 2020-03-28 DIAGNOSIS — R918 Other nonspecific abnormal finding of lung field: Secondary | ICD-10-CM | POA: Diagnosis not present

## 2020-03-28 DIAGNOSIS — E119 Type 2 diabetes mellitus without complications: Secondary | ICD-10-CM | POA: Diagnosis not present

## 2020-03-28 DIAGNOSIS — Z48813 Encounter for surgical aftercare following surgery on the respiratory system: Secondary | ICD-10-CM | POA: Diagnosis not present

## 2020-03-28 DIAGNOSIS — I4891 Unspecified atrial fibrillation: Secondary | ICD-10-CM | POA: Diagnosis not present

## 2020-03-29 ENCOUNTER — Other Ambulatory Visit: Payer: Self-pay | Admitting: Medical

## 2020-03-29 ENCOUNTER — Other Ambulatory Visit: Payer: Self-pay | Admitting: Hematology

## 2020-03-29 MED ORDER — DRONABINOL 2.5 MG PO CAPS: 2.5000 mg | ORAL_CAPSULE | Freq: Two times a day (BID) | ORAL | 0 refills | Status: DC

## 2020-03-29 MED ORDER — DRONABINOL 2.5 MG PO CAPS: 2.5000 mg | ORAL_CAPSULE | Freq: Two times a day (BID) | ORAL | 0 refills | Status: AC

## 2020-04-01 ENCOUNTER — Encounter: Payer: Self-pay | Admitting: Radiation Oncology

## 2020-04-01 ENCOUNTER — Other Ambulatory Visit: Payer: Self-pay

## 2020-04-01 ENCOUNTER — Telehealth: Payer: Self-pay | Admitting: *Deleted

## 2020-04-01 ENCOUNTER — Telehealth: Payer: Self-pay | Admitting: Emergency Medicine

## 2020-04-01 ENCOUNTER — Ambulatory Visit
Admission: RE | Admit: 2020-04-01 | Discharge: 2020-04-01 | Disposition: A | Discharge status: Home / Self Care | Payer: Medicare Other | Source: Ambulatory Visit | Attending: Radiation Oncology | Admitting: Radiation Oncology

## 2020-04-01 VITALS — BP 140/48 | HR 65 | Temp 97.9°F | Resp 20 | Ht 64.0 in | Wt 176.2 lb

## 2020-04-01 DIAGNOSIS — R5383 Other fatigue: Secondary | ICD-10-CM | POA: Diagnosis not present

## 2020-04-01 DIAGNOSIS — Z7982 Long term (current) use of aspirin: Secondary | ICD-10-CM | POA: Insufficient documentation

## 2020-04-01 DIAGNOSIS — C3432 Malignant neoplasm of lower lobe, left bronchus or lung: Secondary | ICD-10-CM | POA: Diagnosis not present

## 2020-04-01 DIAGNOSIS — M542 Cervicalgia: Secondary | ICD-10-CM | POA: Diagnosis not present

## 2020-04-01 DIAGNOSIS — R630 Anorexia: Secondary | ICD-10-CM | POA: Insufficient documentation

## 2020-04-01 DIAGNOSIS — G8929 Other chronic pain: Secondary | ICD-10-CM | POA: Insufficient documentation

## 2020-04-01 DIAGNOSIS — R102 Pelvic and perineal pain: Secondary | ICD-10-CM | POA: Insufficient documentation

## 2020-04-01 DIAGNOSIS — Z923 Personal history of irradiation: Secondary | ICD-10-CM | POA: Diagnosis not present

## 2020-04-01 DIAGNOSIS — J984 Other disorders of lung: Secondary | ICD-10-CM

## 2020-04-01 DIAGNOSIS — Z79899 Other long term (current) drug therapy: Secondary | ICD-10-CM | POA: Insufficient documentation

## 2020-04-01 NOTE — Telephone Encounter (Signed)
Called spoke with [name] patient's daughter. She states the patient was no longer with [facility] and gets her supplies from [place] directly now.  But [facility] has the order for every other day to drain not daily as Dr. Lamonte Sakai mentioned.  I told [name] I would call [facility] and get back to her.  Called [facility] they state they have an order from [facility] to drain every other day. I explained the last instructions from Dr. Lamonte Sakai was to drain daily if greater that 100cc and skip a day if less that 100cc. Denzil Hughes was able to take that as a first time verbal to get 10 canisters to patient.  They will fax me a form to correct how the patient drains.  They state with medicare they cannot call for more supplies until they are 7 days or less of supplies.   I called [name] back and let her aware of this ordering process. She has the correct number to call for drains when then get to less than 7 canisters.  [name] verbalized understanding.  Dr. Lamonte Sakai will be in office 04/02/20 will have him sign new RX   Nothing further needed at this time.

## 2020-04-01 NOTE — Progress Notes (Signed)
Patient here for a f/u visit with Dr. Sondra Come. Patient reporting pain in her right wrist, hip and neck. Reports shortness of breath is worsening. Has a frequent cough with productive clear fluid. Has a drain that  Drains 200-350 cc per day from her left chest cavity. Patient has multiple questions re: her tx in Medical Oncology.  BP (!) 140/48 (BP Location: Left Arm, Patient Position: Sitting, Cuff Size: Normal)   Pulse 65   Temp 97.9 F (36.6 C)   Resp 20   Ht 5\' 4"  (1.626 m)   Wt 176 lb 3.2 oz (79.9 kg)   LMP  (LMP Unknown)   SpO2 95%   BMI 30.24 kg/m   Wt Readings from Last 3 Encounters:  04/01/20 176 lb 3.2 oz (79.9 kg)  03/25/20 175 lb (79.4 kg)  03/13/20 170 lb (77.1 kg)

## 2020-04-01 NOTE — Telephone Encounter (Signed)
Hailey Cobb daughter Hailey Cobb) called on Friday 8/13. She asked that he be given a message to contact patient/family to discuss treatment options and possible immunotherapy.  Informed that Dr. Irene Limbo out of office that day and would be given the message on his return 8/16. She verbalized understanding and stated it was ok to wait until 8/16 and she would anticipate a call on his return. Dr. Irene Limbo informed of 8/13 call on 8/16. Hailey Cobb contacted office morning of 8/16, stated they would like to be contacted today by Dr. Irene Limbo. They would like to discuss options for treatment and also find out why B12 injections have been cancelled. Dr. Irene Limbo informed of call/request.

## 2020-04-01 NOTE — Progress Notes (Signed)
Radiation Oncology         [phone] ________________________________  Name: Hailey Cobb MRN: [id number]  Date: 04/01/2020  DOB: [birth year]  Follow-Up Visit Note  CC: Hulan Fess, MD  Hulan Fess, MD    ICD-10-CM   1. Malignant neoplasm of lower lobe of left lung ([place])  C34.32   2. Cavitating mass in left lower lung lobe  J98.4     Diagnosis: Clinical stage IB squamous carcinoma  Interval Since Last Radiation: One month and one day.  Radiation Treatment Dates: 02/15/2020 through 02/29/2020 Site Technique Total Dose (Gy) Dose per Fx (Gy) Completed Fx Beam Energies  Lung, Left: Lung_Lt_Lower IMRT 50/50 5 10/10 6XFFF    Narrative:  The patient returns today for routine follow-up. Since the end of treatment, the patient followed-up with Dr. Irene Limbo on 03/06/2020. At that time, she was noted to have pancytopenia of unclear etiology. They had discussed beginning palliative immunotherapy versus watching with labs and clinic visits. Atezolizumab was scheduled to begin on 03/14/2020. However, the patient continues to have questions as to whether to begin this therapy.   She continues to have several cc of fluid drained from the left chest region on a regular basis.  She continues to have a catheter in place for this issue.  She complains of chronic pain in the neck and pelvis region.  She denies any hemoptysis or pain within the chest.  She uses oxygen at home primarily with sleeping.  On review of systems, she reports ongoing fatigue.  She also has a poor appetite.   ALLERGIES:  is allergic to metoprolol, clonidine derivatives, glimepiride, and invokana [canagliflozin].  Meds: Current Outpatient Medications  Medication Sig Dispense Refill   albuterol (VENTOLIN HFA) 108 (90 Base) MCG/ACT inhaler Inhale 2 puffs into the lungs every 6 (six) hours as needed for wheezing or shortness of breath. 18 g 6   ALPRAZolam (XANAX) 0.5 MG tablet Take 0.5 mg by mouth 3 (three) times daily as  needed for anxiety.      amLODipine (NORVASC) 5 MG tablet Take 2 tablets (10 mg total) by mouth daily.     amoxicillin (AMOXIL) 500 MG tablet Take 4 capsules (2,000mg ) one hour prior to all dental visits. (Patient not taking: Reported on 02/26/2020) 8 tablet 3   aspirin 81 MG chewable tablet Chew 81 mg by mouth daily.     Azelastine HCl (ASTEPRO) 0.15 % SOLN Place 2 sprays into both nostrils at bedtime.      benzonatate (TESSALON PERLES) 100 MG capsule Take 2 capsules (200 mg total) by mouth 3 (three) times daily as needed for cough. 60 capsule 3   chlorpheniramine-HYDROcodone (TUSSIONEX) 10-8 MG/5ML SUER Take 5 mLs by mouth at bedtime as needed for cough. 115 mL 0   Cyanocobalamin (B-12 COMPLIANCE INJECTION IJ) Inject as directed every 30 (thirty) days.     doxazosin (CARDURA) 2 MG tablet Take 4-8 mg by mouth See admin instructions. Take 4 mg by mouth in the morning and 8 mg at bedtime     dronabinol (MARINOL) 2.5 MG capsule Take 1 capsule (2.5 mg total) by mouth 2 (two) times daily before a meal. 60 capsule 0   furosemide (LASIX) 20 MG tablet Take 1 tablet (20 mg total) by mouth See admin instructions. Take 20 mg and 40 mg alternate every other day 45 tablet 1   Guaifenesin (MUCINEX MAXIMUM STRENGTH) 1200 MG TB12 Take 1,200 mg by mouth in the morning and at bedtime.  HYDROcodone-acetaminophen (NORCO/VICODIN) 5-325 MG tablet Take 1-2 tablets by mouth every 4 (four) hours as needed for severe pain. Do not take and drive 30 tablet 0   HYDROcodone-homatropine (HYCODAN) 5-1.5 MG/5ML syrup Take 5 mLs by mouth every 6 (six) hours as needed for cough. 240 mL 0   irbesartan (AVAPRO) 150 MG tablet Take 150 mg by mouth 2 (two) times daily.     iron polysaccharides (NIFEREX) 150 MG capsule Take 1 capsule (150 mg total) by mouth daily. (Patient not taking: Reported on 03/25/2020) 90 capsule 3   levETIRAcetam (KEPPRA) 500 MG tablet Take 500 mg by mouth daily.     levothyroxine (SYNTHROID) 125  MCG tablet Take 125 mcg by mouth daily before breakfast.     metFORMIN (GLUCOPHAGE) 1000 MG tablet Take 500 mg by mouth 2 (two) times daily with a meal. 500 mg in AM and 500 mg in PM (Patient not taking: Reported on 03/25/2020)     Multiple Vitamins-Minerals (PRESERVISION AREDS PO) Take 1 tablet by mouth daily.     ondansetron (ZOFRAN) 8 MG tablet Take 1 tablet (8 mg total) by mouth every 8 (eight) hours as needed for nausea. 30 tablet 3   OVER THE COUNTER MEDICATION Take 3 tablets by mouth daily. Thymic factor vitamins     polyvinyl alcohol (LIQUIFILM TEARS) 1.4 % ophthalmic solution Place 1 drop into both eyes daily.      rosuvastatin (CRESTOR) 20 MG tablet Take 20 mg by mouth every other day.      senna-docusate (SENNA S) 8.6-50 MG tablet Take 2 tablets by mouth at bedtime. 60 tablet 1   sitaGLIPtin (JANUVIA) 100 MG tablet Take 100 mg by mouth daily.      No current facility-administered medications for this encounter.   Facility-Administered Medications Ordered in Other Encounters  Medication Dose Route Frequency Provider Last Rate Last Admin   lactated ringers infusion   Intravenous Continuous PRN Key, Kristopher, CRNA   New Bag at 01/18/20 1050    Physical Findings: The patient is in no acute distress. Patient is alert and oriented.  height is 5\' 4"  (1.626 m) and weight is 176 lb 3.2 oz (79.9 kg). Her temperature is 97.9 F (36.6 C). Her blood pressure is 140/48 (abnormal) and her pulse is 65. Her respiration is 20 and oxygen saturation is 95%.  No significant changes. Decreased breath sounds noted in the left lower lung field.  The right lung is clear. heart has regular rate and rhythm. No palpable cervical, supraclavicular, or axillary adenopathy. Abdomen soft, non-tender, normal bowel sounds.   Lab Findings: Lab Results  Component Value Date   WBC 4.0 03/25/2020   HGB 10.2 (L) 03/25/2020   HCT 32.4 (L) 03/25/2020   MCV 91.8 03/25/2020   PLT 95 (L) 03/25/2020     Radiographic Findings: No results found.  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   The patient is recovering from the effects of radiation.  Fatigue and poor appetite continues to be an issue for her.  She is being evaluated for immunotherapy.  Plan: Dr. Irene Limbo is to reach out to the patient this week regarding her additional questions. She will follow-up with radiation oncology on an as needed basis.  I offered to consider repeating a chest CT scan to evaluate her response to radiation therapy and patient will consider this and call back if she wishes to have an additional scan.    ____________________________________   Blair Promise, PhD, MD  This document serves as a record of services personally performed by Gery Pray, MD. It was created on his behalf by Clerance Lav, a trained medical scribe. The creation of this record is based on the scribe's personal observations and the provider's statements to them. This document has been checked and approved by the attending provider.

## 2020-04-02 ENCOUNTER — Ambulatory Visit: Payer: Medicare Other

## 2020-04-02 DIAGNOSIS — J918 Pleural effusion in other conditions classified elsewhere: Secondary | ICD-10-CM | POA: Diagnosis not present

## 2020-04-03 ENCOUNTER — Other Ambulatory Visit: Payer: Self-pay | Admitting: Radiation Oncology

## 2020-04-03 ENCOUNTER — Ambulatory Visit: Payer: Medicare Other

## 2020-04-03 MED ORDER — HYDROCOD POLST-CPM POLST ER 10-8 MG/5ML PO SUER: 5.0000 mL | Freq: Every evening | ORAL | 0 refills | Status: DC | PRN

## 2020-04-03 MED ORDER — HYDROCOD POLST-CPM POLST ER 10-8 MG/5ML PO SUER: 5.0000 mL | Freq: Every evening | ORAL | 0 refills | Status: AC | PRN

## 2020-04-03 NOTE — Telephone Encounter (Signed)
Forms were signed and faxed to [facility]  Nothing further needed at this time.

## 2020-04-04 ENCOUNTER — Other Ambulatory Visit: Payer: Medicare Other

## 2020-04-04 ENCOUNTER — Ambulatory Visit: Payer: Self-pay | Admitting: Radiation Oncology

## 2020-04-04 ENCOUNTER — Other Ambulatory Visit: Payer: Self-pay | Admitting: Radiation Oncology

## 2020-04-04 ENCOUNTER — Encounter: Payer: Medicare Other | Admitting: Nutrition

## 2020-04-04 ENCOUNTER — Telehealth: Payer: Self-pay

## 2020-04-04 ENCOUNTER — Telehealth: Payer: Self-pay | Admitting: Hematology

## 2020-04-04 ENCOUNTER — Ambulatory Visit: Payer: Medicare Other

## 2020-04-04 MED ORDER — OXYCODONE-ACETAMINOPHEN 5-325 MG PO TABS: 1.0000 | ORAL_TABLET | Freq: Four times a day (QID) | ORAL | 0 refills | Status: DC | PRN

## 2020-04-04 NOTE — Telephone Encounter (Signed)
Pt was disconnected - will call back to set up appt .    Scheduling Message Entered by Rolland Bimler on 04/03/2020 at 4:56 PM Priority: Routine <No visit type provided>  Department: [facility]  Provider:   Scheduling Notes:  Please contact daughter to schedule patient for B12 injection next week  Thanks!

## 2020-04-04 NOTE — Telephone Encounter (Signed)
Patient's daughter called to see if she had picked up a refill for patient's Tussionex. She states she will pick it up later today. Patient's daughter reports patient has been taking Vicodin for 30 years and it is not helping her pain, is there an alternative? She also reports patient states her nausea medicine is not strong enough. She has an rx for Zofran 8 mg. Daughter advised will ask Dr. Sondra Come about these requests and will be back in touch.

## 2020-04-05 ENCOUNTER — Telehealth: Payer: Self-pay

## 2020-04-05 NOTE — Telephone Encounter (Signed)
TC to patient's daughter, [name] and advised her that Dr. Sondra Come had called in Percocet 5 mg for her mother to try taking for pain as she  had said the vicodin was no longer working. [name] verbalized understanding and plans to pick it up at [facility] later today. The Percocet is instead of the Vicodin.

## 2020-04-09 ENCOUNTER — Encounter (HOSPITAL_COMMUNITY): Payer: Self-pay

## 2020-04-12 ENCOUNTER — Inpatient Hospital Stay: Payer: Medicare Other

## 2020-04-12 ENCOUNTER — Other Ambulatory Visit: Payer: Self-pay

## 2020-04-12 ENCOUNTER — Ambulatory Visit (INDEPENDENT_AMBULATORY_CARE_PROVIDER_SITE_OTHER): Payer: Medicare Other | Admitting: Emergency Medicine

## 2020-04-12 ENCOUNTER — Encounter: Payer: Self-pay | Admitting: Emergency Medicine

## 2020-04-12 DIAGNOSIS — I6320 Cerebral infarction due to unspecified occlusion or stenosis of unspecified precerebral arteries: Secondary | ICD-10-CM

## 2020-04-12 DIAGNOSIS — J90 Pleural effusion, not elsewhere classified: Secondary | ICD-10-CM | POA: Diagnosis not present

## 2020-04-12 DIAGNOSIS — J9601 Acute respiratory failure with hypoxia: Secondary | ICD-10-CM | POA: Diagnosis not present

## 2020-04-12 DIAGNOSIS — E538 Deficiency of other specified B group vitamins: Secondary | ICD-10-CM

## 2020-04-12 DIAGNOSIS — C3432 Malignant neoplasm of lower lobe, left bronchus or lung: Secondary | ICD-10-CM

## 2020-04-12 DIAGNOSIS — D61818 Other pancytopenia: Secondary | ICD-10-CM | POA: Diagnosis not present

## 2020-04-12 DIAGNOSIS — Z5112 Encounter for antineoplastic immunotherapy: Secondary | ICD-10-CM | POA: Diagnosis not present

## 2020-04-12 DIAGNOSIS — J910 Malignant pleural effusion: Secondary | ICD-10-CM | POA: Diagnosis not present

## 2020-04-12 MED ORDER — CYANOCOBALAMIN 1000 MCG/ML IJ SOLN
INTRAMUSCULAR | Status: AC
  Filled 2020-04-12: qty 1

## 2020-04-12 MED ORDER — PROMETHAZINE HCL 12.5 MG PO TABS: 12.5000 mg | ORAL_TABLET | Freq: Four times a day (QID) | ORAL | 0 refills | Status: AC | PRN

## 2020-04-12 MED ORDER — CYANOCOBALAMIN 1000 MCG/ML IJ SOLN
1000.0000 ug | INTRAMUSCULAR | Status: DC
  Administered 2020-04-12: 1000 ug via SUBCUTANEOUS

## 2020-04-12 NOTE — Assessment & Plan Note (Signed)
Supplemental oxygen as ordered.

## 2020-04-12 NOTE — Assessment & Plan Note (Signed)
Not a good candidate for immunotherapy based on her molecular profile and her radiation therapy has been completed.  She was offered chemotherapy but is unsure that she wants to undergo based on the benefits versus side effects profile.  I talked to her today frankly about the potential benefits of hospice care in this setting.  I think that she would benefit as she is having dyspnea, decreased functional capacity, back pain.  I offered to be her hospice attending if she wants to participate.  She is going to think about it and talk to her family about it and then get back to me next week.  Prolonged discussion regarding GOC. Also confirmed her code status.

## 2020-04-12 NOTE — Progress Notes (Signed)
   Subjective:    Patient ID: Margaretha Sheffield, female    DOB: [birth year], 86 y.o.   MRN: [id number]  HPI 86 year old woman with new diagnosis of non-small cell lung cancer due to left lung mass, associated left effusion with Pleurx catheter placement. She has been draining most days - usually getting 200- 300's cc, has had as high as 650cc, yesterday got 50cc. Has had coughing spells - productive of clear mucous. She has used tussionex and tessalon with some relief. Activity is decreased. She is receiving XRT, planning through this week. She is working with Dr Irene Limbo and being considered for possible immunotherapy   ROV 04/12/20 --Mrs. Bubar is 74 with a history of left non-small cell lung cancer and associated left malignant pleural effusion.  She has a Pleurx catheter in place draining about 200-250cc daily. She underwent immunotherapy x 1 but her molecular markers are negative. She had XRT to the L mass - has had maximum that she can receive.  She is considering Hospice. She has an O2 concentrator, but prefers tanks due to the noise.    Review of Systems As above      Objective:   Physical Exam Vitals:   04/12/20 1219  BP: 106/60  Pulse: 70  Temp: (!) 97.4 F (36.3 C)  TempSrc: Temporal  SpO2: 96%  Weight: 174 lb (78.9 kg)  Height: 5\' 4"  (1.626 m)   Gen: Pleasant, elderly woman, in no distress,  normal affect  ENT: No lesions,  mouth clear,  oropharynx clear, no postnasal drip  Neck: No JVD, no stridor, frequent cough  Lungs: No use of accessory muscles, coarse B  Cardiovascular: RRR, heart sounds normal, no murmur or gallops, no peripheral edema  Musculoskeletal: No deformities, no cyanosis or clubbing  Neuro: alert, awake, non focal  Skin: Warm, some abrasions on her L flank that have evolved from dressing irritation. No purulence, healing. pleurex insertion site and dressing look good      Assessment & Plan:  Recurrent left pleural effusion Pleurx in place.  Plan to  continue draining daily.  Were working on getting her the adequate equipment, ordered through her DME  Acute respiratory failure with hypoxia ([place]) Supplemental oxygen as ordered.  Malignant neoplasm of lower lobe of left lung [facility]) Not a good candidate for immunotherapy based on her molecular profile and her radiation therapy has been completed.  She was offered chemotherapy but is unsure that she wants to undergo based on the benefits versus side effects profile.  I talked to her today frankly about the potential benefits of hospice care in this setting.  I think that she would benefit as she is having dyspnea, decreased functional capacity, back pain.  I offered to be her hospice attending if she wants to participate.  She is going to think about it and talk to her family about it and then get back to me next week.  Prolonged discussion regarding GOC. Also confirmed her code status.   Baltazar Apo, MD, PhD 04/12/2020, 5:31 PM [facility] [phone] or if no answer [phone]

## 2020-04-12 NOTE — Patient Instructions (Addendum)
We we talked today about possible hospice care.  This would provide [place] with strategies to help with your symptom management, supportive in the home, provide nurse visits, therapy.  We agreed that you would call our office next week so that we can talk about whether we should make a hospice referral. Keep albuterol available to use 2 puffs up to every 4 hours if needed for shortness of breath, chest tightness, wheezing.  Please continue draining your Pleurx catheter daily as you have been doing. We will work on Systems analyst.  Follow with Dr Lamonte Sakai in 1 month or next available

## 2020-04-12 NOTE — Assessment & Plan Note (Signed)
Pleurx in place.  Plan to continue draining daily.  Were working on getting her the adequate equipment, ordered through her DME

## 2020-04-12 NOTE — Patient Instructions (Signed)

## 2020-04-18 ENCOUNTER — Telehealth: Payer: Self-pay

## 2020-04-18 NOTE — Telephone Encounter (Signed)
Patient;s daughter, [name] called and states her mother has decided she wants to have the CT scan done that was discussed with Dr. Sondra Come 2 weeks ago. She was advised that Dr. Sondra Come will be notified and someone will be in touch to scheduled this.

## 2020-04-24 ENCOUNTER — Other Ambulatory Visit: Payer: Self-pay | Admitting: Hematology

## 2020-04-24 ENCOUNTER — Telehealth: Payer: Self-pay | Admitting: *Deleted

## 2020-04-24 DIAGNOSIS — D500 Iron deficiency anemia secondary to blood loss (chronic): Secondary | ICD-10-CM | POA: Diagnosis not present

## 2020-04-24 DIAGNOSIS — K5900 Constipation, unspecified: Secondary | ICD-10-CM | POA: Diagnosis not present

## 2020-04-24 DIAGNOSIS — E538 Deficiency of other specified B group vitamins: Secondary | ICD-10-CM

## 2020-04-24 DIAGNOSIS — D61818 Other pancytopenia: Secondary | ICD-10-CM | POA: Diagnosis not present

## 2020-04-24 DIAGNOSIS — Z79899 Other long term (current) drug therapy: Secondary | ICD-10-CM | POA: Diagnosis not present

## 2020-04-24 DIAGNOSIS — J910 Malignant pleural effusion: Secondary | ICD-10-CM | POA: Diagnosis not present

## 2020-04-24 DIAGNOSIS — C3432 Malignant neoplasm of lower lobe, left bronchus or lung: Secondary | ICD-10-CM

## 2020-04-24 DIAGNOSIS — D509 Iron deficiency anemia, unspecified: Secondary | ICD-10-CM

## 2020-04-24 DIAGNOSIS — J9601 Acute respiratory failure with hypoxia: Secondary | ICD-10-CM

## 2020-04-24 DIAGNOSIS — Z85118 Personal history of other malignant neoplasm of bronchus and lung: Secondary | ICD-10-CM | POA: Diagnosis not present

## 2020-04-24 DIAGNOSIS — E890 Postprocedural hypothyroidism: Secondary | ICD-10-CM | POA: Diagnosis not present

## 2020-04-24 NOTE — Telephone Encounter (Addendum)
Patient's daughter Ms. Culler called - Patient is increasingly weak; has black stools with occ constipation;poor appetite; rarely out of bed. Patient had iron infusion on 7/9 and had B12 shot on 8/27. Daughter states pleurx drainage is <50 cc with no blood in drainage.  Daughter wants to know if mother needs a blood test to see if she needs iron or when mother should return to Dr. Irene Limbo. Daughter also asked if palliative care might be appropriate. Dr. Irene Limbo informed of patient's daughter concerns.  Dr. Irene Limbo ordered CT scan (urgent) and requested appt for labs today or tomorrow. Contacted daughter - informed her of orders for labs and CT. Radiology scheduling will contact her for CT. Asked her when she could bring her mother to [facility] either today or tomorrow for labs. She stated she made appt for mother at Dr. Eddie Dibbles office today. She asked to have the lab orders from Dr. Irene Limbo faxed to Dr. Eddie Dibbles office to see if they could draw them while she is there today. Lab requests faxed to Dr. Eddie Dibbles office [phone]. Daughter will contact office to advise if labs were able to be drawn or if patient needs lab appt at [place].

## 2020-04-25 ENCOUNTER — Telehealth: Payer: Self-pay | Admitting: Emergency Medicine

## 2020-04-25 ENCOUNTER — Other Ambulatory Visit: Payer: Medicare Other

## 2020-04-25 ENCOUNTER — Ambulatory Visit: Payer: Medicare Other

## 2020-04-25 NOTE — Telephone Encounter (Signed)
Tried Medical sales representative and had to [facility]

## 2020-04-25 NOTE — Telephone Encounter (Signed)
Called and spoke with Joann with hospice and palliative care  She states that pt's daughter [name] reached out to them today and scheduled a meeting  She states that they will meet with the family tomorrow to decide whether or not she wants to go into the hospice program or the palliative care program  Wanted to keep Dr Lamonte Sakai updated since he suggested hospice to the pt

## 2020-04-25 NOTE — Telephone Encounter (Signed)
Arville Go returning a phone call. Arville Go can be reached at [phone].

## 2020-04-25 NOTE — Telephone Encounter (Signed)
Per Dr. Agustina Caroli last OV note: I talked to her today frankly about the potential benefits of hospice care in this setting.  I think that she would benefit as she is having dyspnea, decreased functional capacity, back pain.  I offered to be her hospice attending if she wants to participate.  She is going to think about it and talk to her family about it and then get back to me next week.

## 2020-04-25 NOTE — Telephone Encounter (Signed)
Please let her know that I agree with an order for hospice care.  I would be happy to be the hospice attending if Dr. Hulan Fess does not want to do so.  Please go ahead and make the referral order

## 2020-04-25 NOTE — Telephone Encounter (Signed)
Patient is ready to start hospice. Can we place the order? Any other recommendations? Please advise.

## 2020-04-25 NOTE — Telephone Encounter (Signed)
Thank you very much - I will watch for an update

## 2020-04-25 NOTE — Telephone Encounter (Signed)
Pt's daughter calling to get a referral for patient to start hospice. Pt's daughter can be reached at [phone].

## 2020-04-26 ENCOUNTER — Ambulatory Visit (HOSPITAL_COMMUNITY): Payer: Medicare Other

## 2020-04-26 ENCOUNTER — Telehealth: Payer: Self-pay | Admitting: Emergency Medicine

## 2020-04-26 ENCOUNTER — Telehealth: Payer: Self-pay | Admitting: *Deleted

## 2020-04-26 ENCOUNTER — Other Ambulatory Visit: Payer: Self-pay | Admitting: *Deleted

## 2020-04-26 DIAGNOSIS — C3432 Malignant neoplasm of lower lobe, left bronchus or lung: Secondary | ICD-10-CM

## 2020-04-26 NOTE — Telephone Encounter (Signed)
RB please advise if you are willing to be patient's attending MD through hospice.  Thanks!

## 2020-04-26 NOTE — Telephone Encounter (Signed)
Albina Billet from [facility] returning aphone call to see RB is ok with being the attending provider for patient while on hospice care. Albina Billet can be reached at [phone]

## 2020-04-26 NOTE — Telephone Encounter (Signed)
Yes

## 2020-04-27 NOTE — Telephone Encounter (Signed)
Called and spoke with Gerri from [facility] letting her know that [place] said he would be pt's attending. Gerri verbalized understanding. Nothing further needed.

## 2020-04-29 ENCOUNTER — Telehealth: Payer: Self-pay | Admitting: Emergency Medicine

## 2020-04-29 DIAGNOSIS — J90 Pleural effusion, not elsewhere classified: Secondary | ICD-10-CM

## 2020-04-29 NOTE — Telephone Encounter (Signed)
Spoke with Tammy from [facility] calling to get an order for o2 to be increased to 4lpm as pt is more comfortable at 4 lpm as pt has been very sob at 2 liters. Tammy can be reached at  [phone]  [facility] can you please advise.  Thank you.

## 2020-04-29 NOTE — Telephone Encounter (Signed)
Yes that is fine. Please also forward above message to [facility] as pleurx isn't draining. He may want CXR to monitor effusion.

## 2020-04-29 NOTE — Telephone Encounter (Signed)
Spoke with the pt's daughter  She states that for about a wk now when she goes to drain pt's pleurx only a drip comes out and nothing more  She is wondering if this is because her tumor is growing larger  She also asked about order for POC  She has not been walked for this  Looks like she was too ill the last time she was here  Please advise thanks

## 2020-04-29 NOTE — Telephone Encounter (Signed)
Spoke with the pt's daughter and cxr was ordered

## 2020-04-29 NOTE — Telephone Encounter (Signed)
Spoke with Tammy from [facility] per [facility]  Yes that is fine to up 02 to 4Lpm . Please also forward above message to [facility] as pleurx isn't draining. He may want CXR to monitor effusion.   Will forward to Dr.Byurm .

## 2020-04-29 NOTE — Telephone Encounter (Signed)
Late entry 04/25/20: Daughter left message for provider on 9/9: mother having increased neck/back and generalized pain. She wondered if Dr. Irene Limbo would prescribe a longer acting pain medication for patient - currently has Percocet 5 mg (per Dr. Sondra Come) every 6 hours and Hydrocodone 5 mg every 6 hours (per Dr. Rex Kras). Patient is alternating them, every 6 hours.   Patient is weak and has dark stools. Dr. Irene Limbo informed, ordered stat CT (sched for 9/10) and recommended patient come in for lab appt today. Daughter states she is taking patient to appt with PCP Dr. Rex Kras today and asked to have lab orders sent to his office. Lab requests faxed to Dr. Eddie Dibbles office Adele Dan) Joylene Igo confirmation received. Dr. Eddie Dibbles office will fax lab results.   Dr. Irene Limbo asked that patient's daughter be advised that medication prescriptions will need to wait until after tests are completed. Daughter verbalized understanding of all information.   Late entry 04/26/20: Lab results received from Dr. Eddie Dibbles office, given to Dr. Irene Limbo. Contacted patient's daughter to confirm CT scheduled for 3PM afternoon 9/10. Daughter stated her mother told her she was too weak to go, they have rescheduled for 9/14.   Daughter states a nurse from [facility] came to home for evaluation. Patient does not want Hospice care at this time.   She also described mother as getting up every 15 minutes at night - not sure if mother is in pain or UTI. States mother was given  RX Cephalexin 500 mg, 2 x day about a week ago in case it was a UTI.   Daughter states mother wants to continue weekly B12 injections and asked about IV iron infusions. Dr. Irene Limbo informed of daughter's concerns and questions.

## 2020-04-29 NOTE — Telephone Encounter (Signed)
Tammy from [facility] calling to get an order for o2 to be increased to 4lpm as pt is more comfortable at 4 lpm as pt has been very sob at 2 liters. Tammy can be reached at  [phone]

## 2020-04-29 NOTE — Telephone Encounter (Signed)
I agree with checking a CXR. Please order, for pleural effusion.

## 2020-04-30 ENCOUNTER — Other Ambulatory Visit: Payer: Self-pay | Admitting: Radiation Oncology

## 2020-04-30 ENCOUNTER — Telehealth: Payer: Self-pay | Admitting: *Deleted

## 2020-04-30 ENCOUNTER — Ambulatory Visit (HOSPITAL_COMMUNITY): Payer: Medicare Other

## 2020-04-30 ENCOUNTER — Ambulatory Visit: Payer: Medicare Other

## 2020-04-30 ENCOUNTER — Telehealth: Payer: Self-pay | Admitting: Emergency Medicine

## 2020-04-30 MED ORDER — OXYCODONE-ACETAMINOPHEN 5-325 MG PO TABS
1.0000 | ORAL_TABLET | Freq: Four times a day (QID) | ORAL | 0 refills | Status: AC | PRN
Start: 2020-04-30 — End: ?

## 2020-04-30 NOTE — Telephone Encounter (Signed)
Left a message with hospice to contact our office regarding calling to let us know patient is ready to enroll in hospice services and are wanting to know if RB will remain Attending Physician for patient under hospice. Please advise.[phone]    Dr.Byrum can you please advise  Thank you

## 2020-04-30 NOTE — Telephone Encounter (Signed)
Received call from McNair with Hospice & Palliative care.  [phone].  She states that patients family reached out to them about Palliative services.  Patient is close to being appropriate for Hospice but patient and family are not ready just yet for this.    She states that visit with patient yesterday found the patient to be in a lot of pain.  She is alternating between Norco and Oxycodone and patient only has 6 tablets of each left as of yesterday during the visit.  She also reported the patient having shortness of breath.    Called to ask if more Percocet could be called in to the pharmacy.  Routed to MD to review.  Routed to Medical Oncology MD as well so that they are aware of patients decision to proceed with Palliative care for now.

## 2020-04-30 NOTE — Telephone Encounter (Signed)
Received call from Tammy with Hospice and patient and family decided to go ahead with Hospice.  They stated that the patient made this decision and she will not be proceeding with the CT scan that was scheduled for today.  Dr. Sondra Come did approve the refill of Oxycodone.  Hospice advised RN will go out to the home this evening to do formal hospice assessment.

## 2020-04-30 NOTE — Telephone Encounter (Signed)
Yes I am glad to do so

## 2020-05-01 ENCOUNTER — Ambulatory Visit: Payer: Medicare Other

## 2020-05-01 DIAGNOSIS — J910 Malignant pleural effusion: Secondary | ICD-10-CM | POA: Diagnosis not present

## 2020-05-01 DIAGNOSIS — S069X0S Unspecified intracranial injury without loss of consciousness, sequela: Secondary | ICD-10-CM | POA: Diagnosis not present

## 2020-05-01 DIAGNOSIS — I5032 Chronic diastolic (congestive) heart failure: Secondary | ICD-10-CM | POA: Diagnosis not present

## 2020-05-01 DIAGNOSIS — Z438 Encounter for attention to other artificial openings: Secondary | ICD-10-CM | POA: Diagnosis not present

## 2020-05-01 DIAGNOSIS — I110 Hypertensive heart disease with heart failure: Secondary | ICD-10-CM | POA: Diagnosis not present

## 2020-05-01 DIAGNOSIS — K219 Gastro-esophageal reflux disease without esophagitis: Secondary | ICD-10-CM | POA: Diagnosis not present

## 2020-05-01 DIAGNOSIS — R1310 Dysphagia, unspecified: Secondary | ICD-10-CM | POA: Diagnosis not present

## 2020-05-01 DIAGNOSIS — J309 Allergic rhinitis, unspecified: Secondary | ICD-10-CM | POA: Diagnosis not present

## 2020-05-01 DIAGNOSIS — I4891 Unspecified atrial fibrillation: Secondary | ICD-10-CM | POA: Diagnosis not present

## 2020-05-01 DIAGNOSIS — I4510 Unspecified right bundle-branch block: Secondary | ICD-10-CM | POA: Diagnosis not present

## 2020-05-01 DIAGNOSIS — G40909 Epilepsy, unspecified, not intractable, without status epilepticus: Secondary | ICD-10-CM | POA: Diagnosis not present

## 2020-05-01 DIAGNOSIS — I495 Sick sinus syndrome: Secondary | ICD-10-CM | POA: Diagnosis not present

## 2020-05-01 DIAGNOSIS — C3432 Malignant neoplasm of lower lobe, left bronchus or lung: Secondary | ICD-10-CM | POA: Diagnosis not present

## 2020-05-01 DIAGNOSIS — E119 Type 2 diabetes mellitus without complications: Secondary | ICD-10-CM | POA: Diagnosis not present

## 2020-05-01 DIAGNOSIS — Z9981 Dependence on supplemental oxygen: Secondary | ICD-10-CM | POA: Diagnosis not present

## 2020-05-01 DIAGNOSIS — E890 Postprocedural hypothyroidism: Secondary | ICD-10-CM | POA: Diagnosis not present

## 2020-05-01 DIAGNOSIS — Z8544 Personal history of malignant neoplasm of other female genital organs: Secondary | ICD-10-CM | POA: Diagnosis not present

## 2020-05-01 NOTE — Telephone Encounter (Signed)
ATC Manus Gunning unable to as office was closed

## 2020-05-02 DIAGNOSIS — Z9981 Dependence on supplemental oxygen: Secondary | ICD-10-CM | POA: Diagnosis not present

## 2020-05-02 DIAGNOSIS — C3432 Malignant neoplasm of lower lobe, left bronchus or lung: Secondary | ICD-10-CM | POA: Diagnosis not present

## 2020-05-02 DIAGNOSIS — R1310 Dysphagia, unspecified: Secondary | ICD-10-CM | POA: Diagnosis not present

## 2020-05-02 DIAGNOSIS — I110 Hypertensive heart disease with heart failure: Secondary | ICD-10-CM | POA: Diagnosis not present

## 2020-05-02 DIAGNOSIS — J910 Malignant pleural effusion: Secondary | ICD-10-CM | POA: Diagnosis not present

## 2020-05-02 DIAGNOSIS — Z438 Encounter for attention to other artificial openings: Secondary | ICD-10-CM | POA: Diagnosis not present

## 2020-05-16 ENCOUNTER — Other Ambulatory Visit: Payer: Medicare Other

## 2020-05-16 ENCOUNTER — Ambulatory Visit: Payer: Medicare Other

## 2020-05-17 NOTE — Telephone Encounter (Signed)
Left detailed message for Manus Gunning that Dr. Lamonte Sakai would be pt's attending provider and to call back if there is anything further that is needed. Will sign off.

## 2020-05-17 DEATH — deceased

## 2020-05-23 ENCOUNTER — Other Ambulatory Visit: Payer: Medicare Other

## 2020-05-23 ENCOUNTER — Ambulatory Visit: Payer: Medicare Other

## 2020-05-23 ENCOUNTER — Ambulatory Visit: Payer: Medicare Other | Admitting: Hematology

## 2020-05-28 ENCOUNTER — Ambulatory Visit: Payer: Medicare Other

## 2020-05-29 ENCOUNTER — Ambulatory Visit: Payer: Medicare Other

## 2020-06-18 IMAGING — CT CT ANGIO CHEST
2 of 7 series · 16 of 36 positions shown · IV contrast (APPLIED)
Comparison: CT the abdomen and pelvis 12/17/2018.

CLINICAL DATA: 87-year-old female with history of severe aortic
stenosis. Preprocedural study prior to potential transcatheter
aortic valve replacement (TAVR) procedure.

EXAM:
CT ANGIOGRAPHY CHEST, ABDOMEN AND PELVIS
TECHNIQUE: Multidetector CT imaging through the chest, abdomen and pelvis was
performed using the standard protocol during bolus administration of
intravenous contrast. Multiplanar reconstructed images and MIPs were
obtained and reviewed to evaluate the vascular anatomy.
CONTRAST:  100mL OMNIPAQUE IOHEXOL 350 MG/ML SOLN

[Series 3: ax thins · axial · 0.59mm/px · z∈[-641,-69]mm · 15 of 644 slices shown]
[im 36/644  lung]
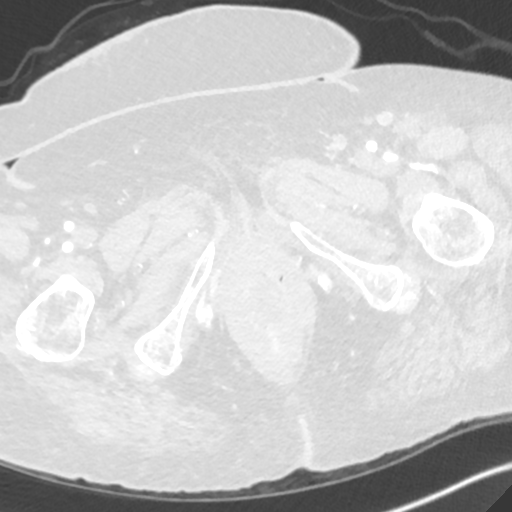
[im 72/644  mediastinal]
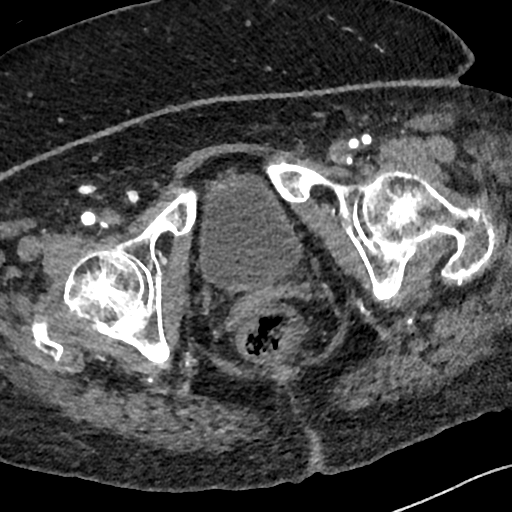
[im 108/644  lung]
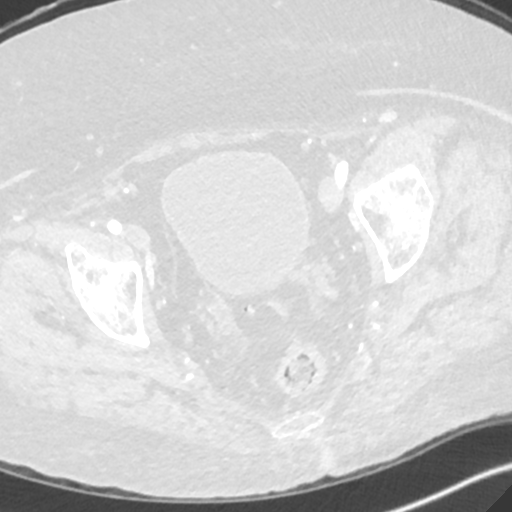
[im 143/644  mediastinal]
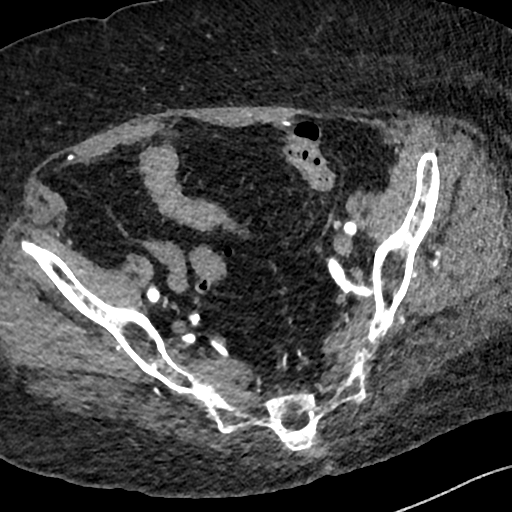
[im 215/644  lung]
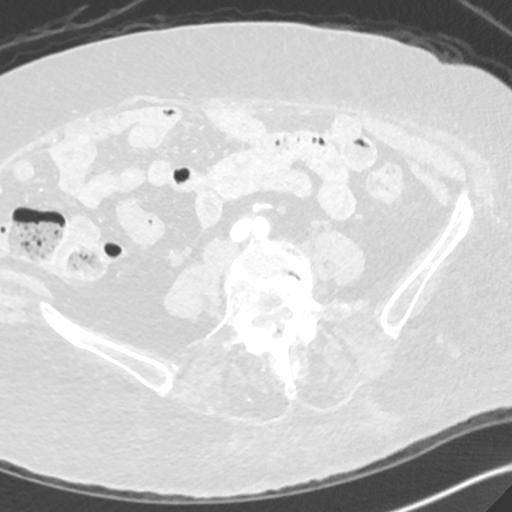
[im 251/644  mediastinal]
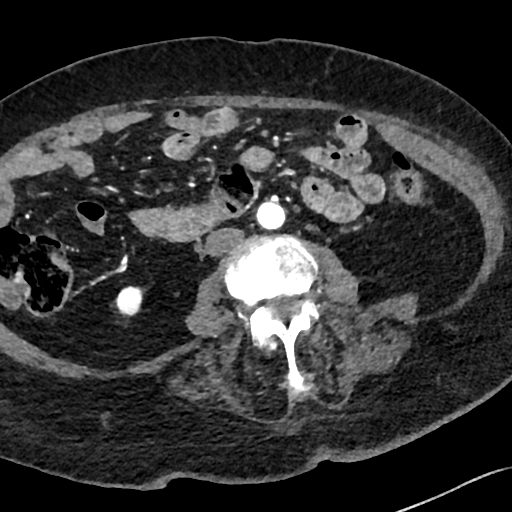
[im 286/644  lung]
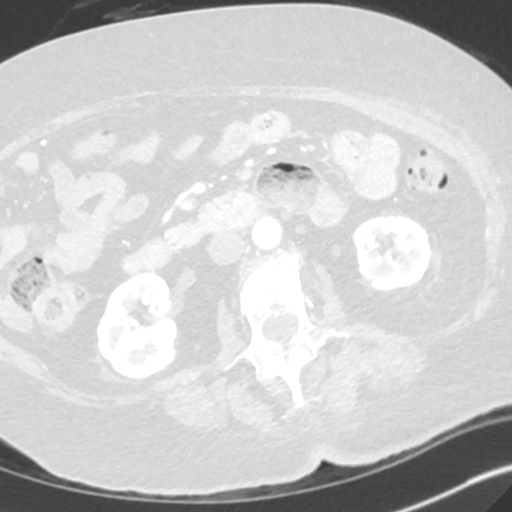
[im 322/644  mediastinal]
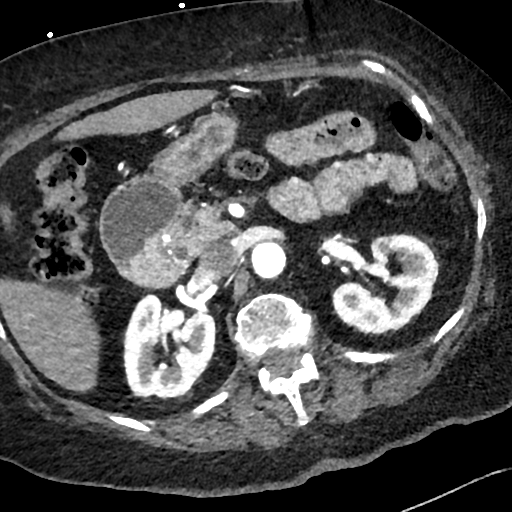
[im 358/644  lung]
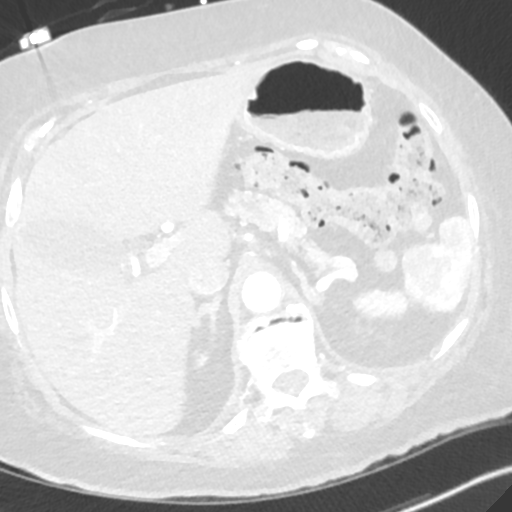
[im 393/644  mediastinal]
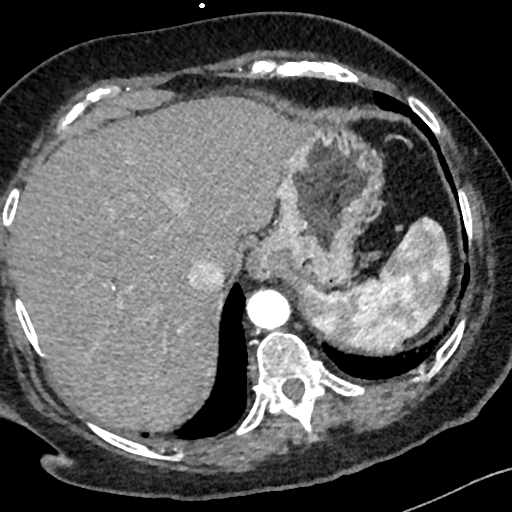
[im 429/644  lung]
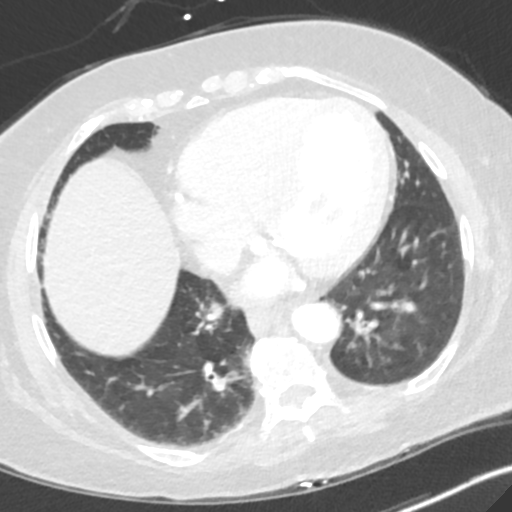
[im 501/644  mediastinal]
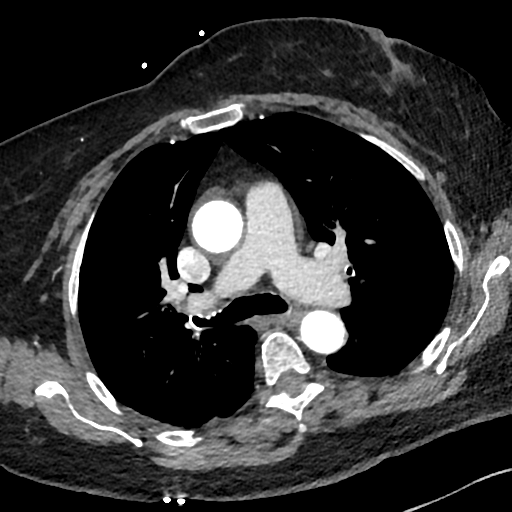
[im 536/644  lung]
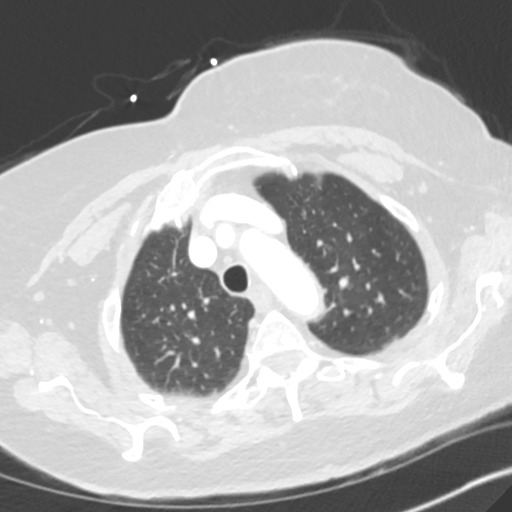
[im 572/644  mediastinal]
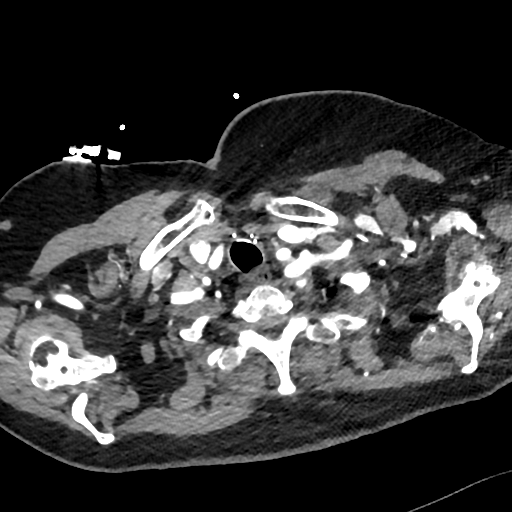
[im 608/644  lung]
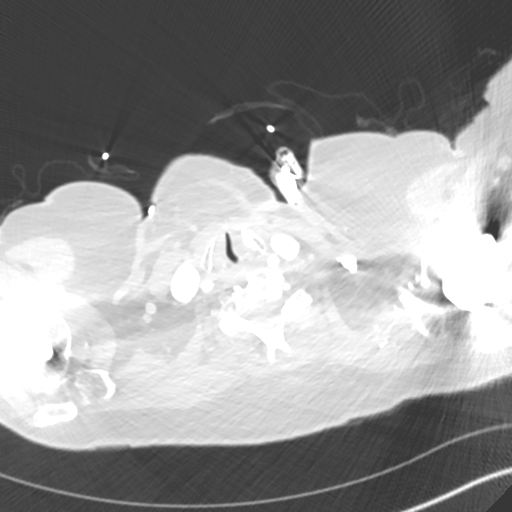

[Series 6: cor · coronal · 0.97mm/px · 1 of 153 slices shown]
[im 77/153  mediastinal]
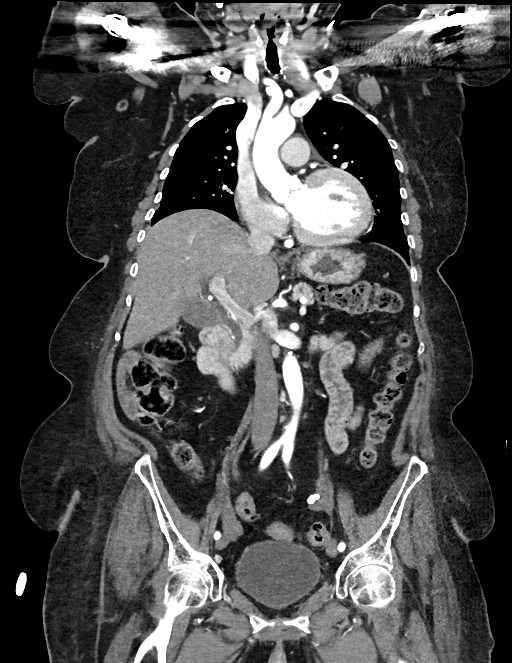

[16 of 36 positions shown; findings below may reference images not displayed]

FINDINGS: CTA CHEST FINDINGS

Cardiovascular: Heart size is mildly enlarged. There is no
significant pericardial fluid, thickening or pericardial
calcification. There is aortic atherosclerosis, as well as
atherosclerosis of the great vessels of the mediastinum and the
coronary arteries, including calcified atherosclerotic plaque in the
left anterior descending, left circumflex and right coronary
arteries. Severe thickening calcification of the aortic valve.
Calcifications of the anterior leaflet of the mitral valve and
inferior mitral annulus.

Mediastinum/Lymph Nodes: No pathologically enlarged mediastinal or
hilar lymph nodes. Esophagus is unremarkable in appearance. No
axillary lymphadenopathy.

Lungs/Pleura: Tiny calcified granuloma in the periphery of the left
upper lobe. No suspicious appearing pulmonary nodules or masses are
noted. No acute consolidative airspace disease. No pleural
effusions.

Musculoskeletal/Soft Tissues: Status post bilateral shoulder
arthroplasty. There are no aggressive appearing lytic or blastic
lesions noted in the visualized portions of the skeleton.

CTA ABDOMEN AND PELVIS FINDINGS

Hepatobiliary: No suspicious cystic or solid hepatic lesions. No
intra or extrahepatic biliary ductal dilatation. Gallbladder is
normal in appearance.

Pancreas: No pancreatic mass. No pancreatic ductal dilatation. No
pancreatic or peripancreatic fluid collections or inflammatory
changes.

Spleen: Small splenules adjacent to the spleen, otherwise,
unremarkable.

Adrenals/Urinary Tract: Bilateral kidneys and adrenal glands are
normal in appearance. No hydroureteronephrosis. Urinary bladder is
normal in appearance.

Stomach/Bowel: Normal appearance of the stomach. No pathologic
dilatation of small bowel or colon. Diverticulum in the proximal
jejunum measuring 4.3 x 3.0 cm, without surrounding inflammatory
changes. A few scattered colonic diverticulae are noted in the
sigmoid colon, without surrounding inflammatory changes to suggest
an acute diverticulitis at this time. The appendix is not
confidently identified and may be surgically absent. Regardless,
there are no inflammatory changes noted adjacent to the cecum to
suggest the presence of an acute appendicitis at this time.

Vascular/Lymphatic: Aortic atherosclerosis, without evidence of
aneurysm or dissection in the abdominal or pelvic vasculature.
Vascular findings and measurements pertinent to potential TAVR
procedure, as detailed below. No lymphadenopathy noted in the
abdomen or pelvis.

Reproductive: Status post hysterectomy.  Ovaries are trophic.

Other: No significant volume of ascites.  No pneumoperitoneum.

Musculoskeletal: There are no aggressive appearing lytic or blastic
lesions noted in the visualized portions of the skeleton.

VASCULAR MEASUREMENTS PERTINENT TO TAVR:

AORTA:

Minimal Aortic 6iameter-B7 x 17 mm

Severity of Aortic Calcification-mild

RIGHT PELVIS:

Right Common Iliac Artery -

Minimal Diameter-LM.2 x 9.4 mm

Tortuosity-mild

Calcification-none

Right External Iliac Artery -

Minimal Niameter-V.B x 5.9 mm

Tortuosity-moderate

Calcification-none

Right Common Femoral Artery -

Minimal 6iameter-V.C x 6.8 mm

Tortuosity-mild

Calcification-mild

LEFT PELVIS:

Left Common Iliac Artery -

Minimal 2iameter-O.E x 8.5 mm

Tortuosity-mild

Calcification-mild

Left External Iliac Artery -

Minimal Niameter-V.M x 5.9 mm

Tortuosity-moderate

Calcification-none

Left Common Femoral Artery -

Minimal Riameter-2.F x 6.8 mm

Tortuosity-mild

Calcification-none

Review of the MIP images confirms the above findings.
IMPRESSION: 1. Vascular findings and measurements pertinent to potential TAVR
procedure, as detailed above.
2. Severe thickening calcification of the aortic valve, compatible
with the reported clinical history of severe aortic stenosis.
3. Aortic atherosclerosis, in addition to three-vessel coronary
artery disease.
4. Colonic diverticulosis, without evidence of acute diverticulitis
at this time.
5. Additional incidental findings, as above.

## 2020-06-25 ENCOUNTER — Ambulatory Visit: Payer: Medicare Other

## 2020-06-26 ENCOUNTER — Ambulatory Visit: Payer: Medicare Other

## 2020-07-06 IMAGING — CR DG CHEST 2V
2 series · 2 of 2 positions shown · non-contrast
Comparison: May 11, 2019

CLINICAL DATA: Preoperative examination

EXAM:
CHEST - 2 VIEW

[chest pa]
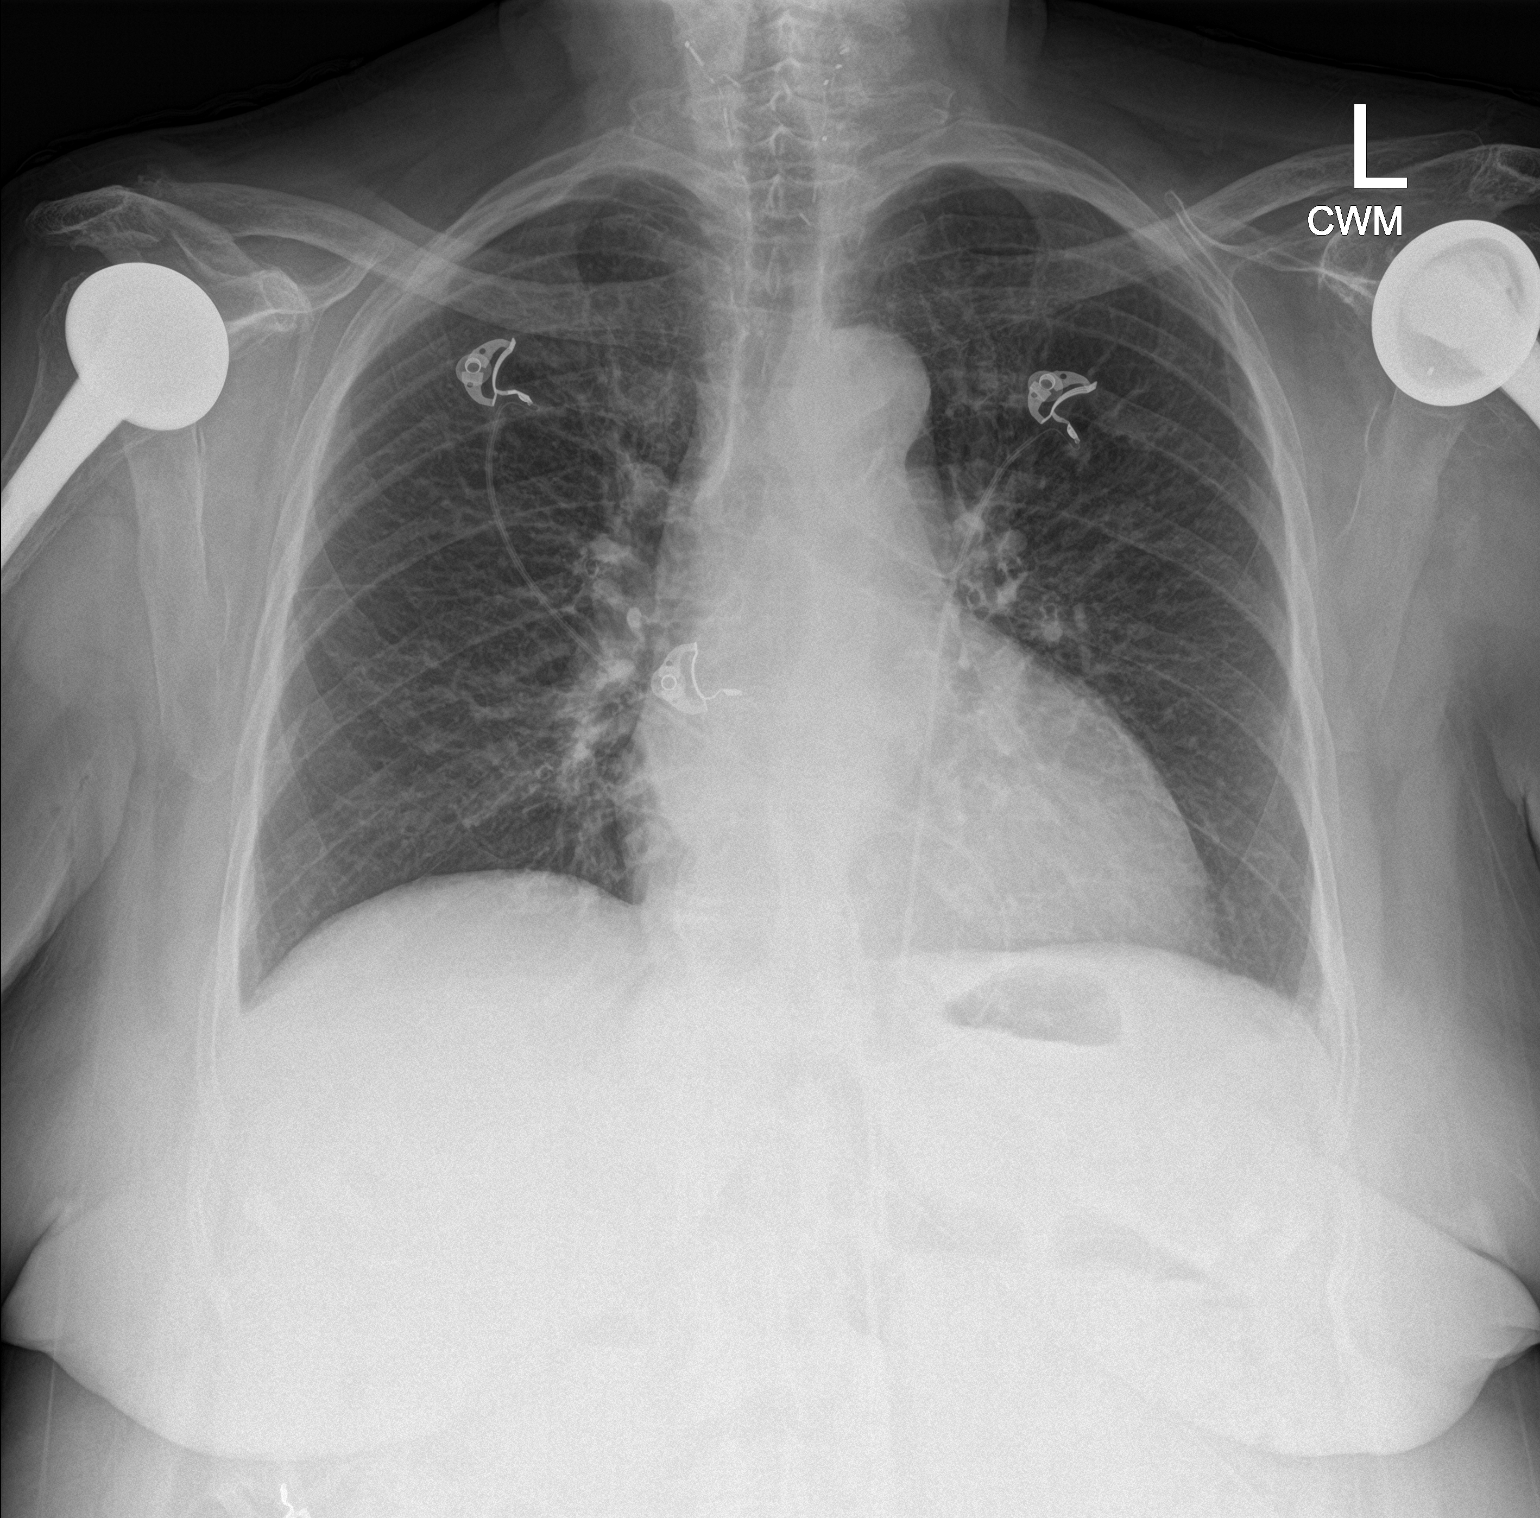

[chest lat]
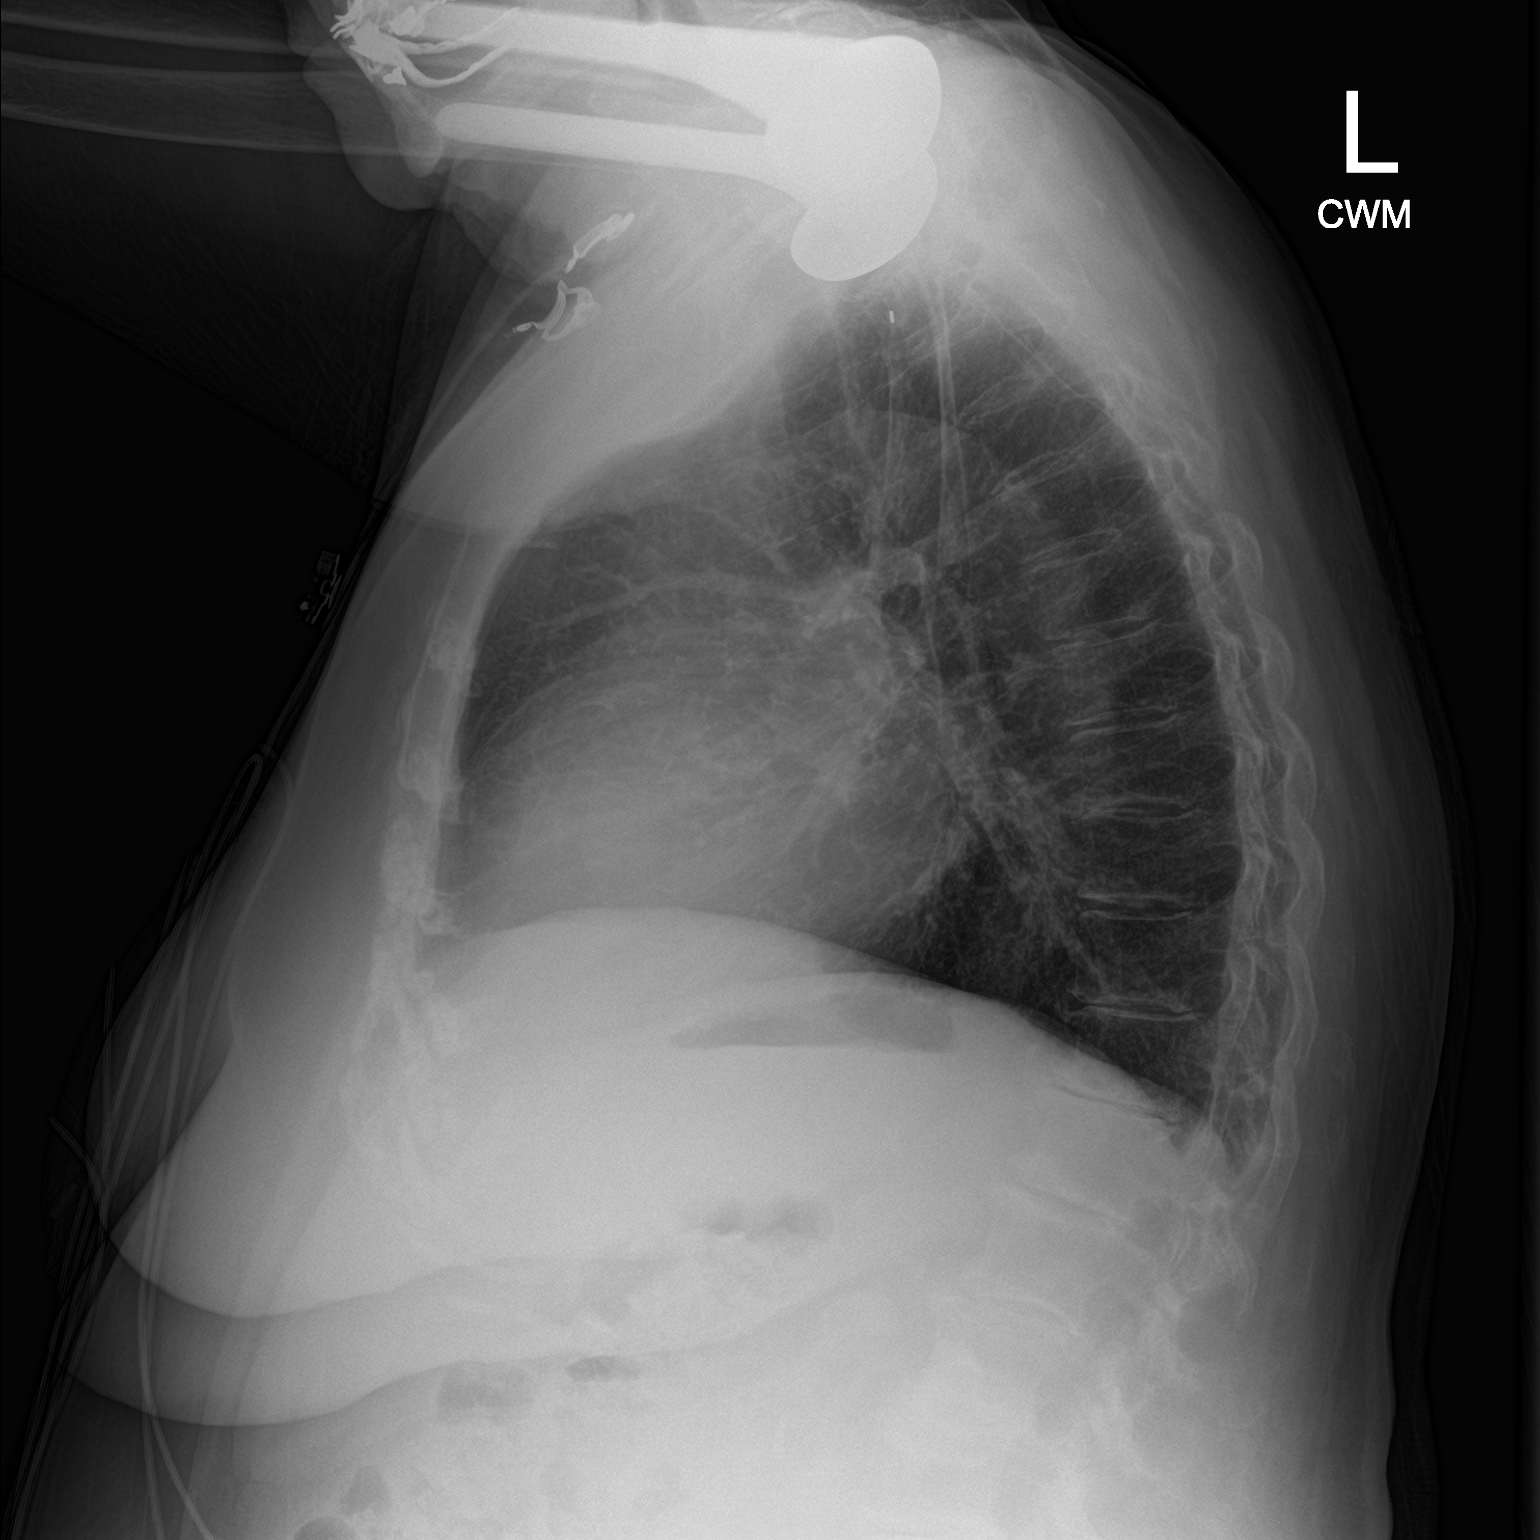

[2 of 2 positions shown; findings below may reference images not displayed]

FINDINGS: The heart size and mediastinal contours are within normal limits.
Both lungs are clear. The visualized skeletal structures are
unremarkable.
IMPRESSION: No active cardiopulmonary disease.

## 2020-07-09 IMAGING — DX DG CHEST 1V PORT
1 series · 1 of 1 positions shown · non-contrast
Comparison: 05/15/2019

CLINICAL DATA: Status post TAVR

EXAM:
PORTABLE CHEST 1 VIEW

[chest]
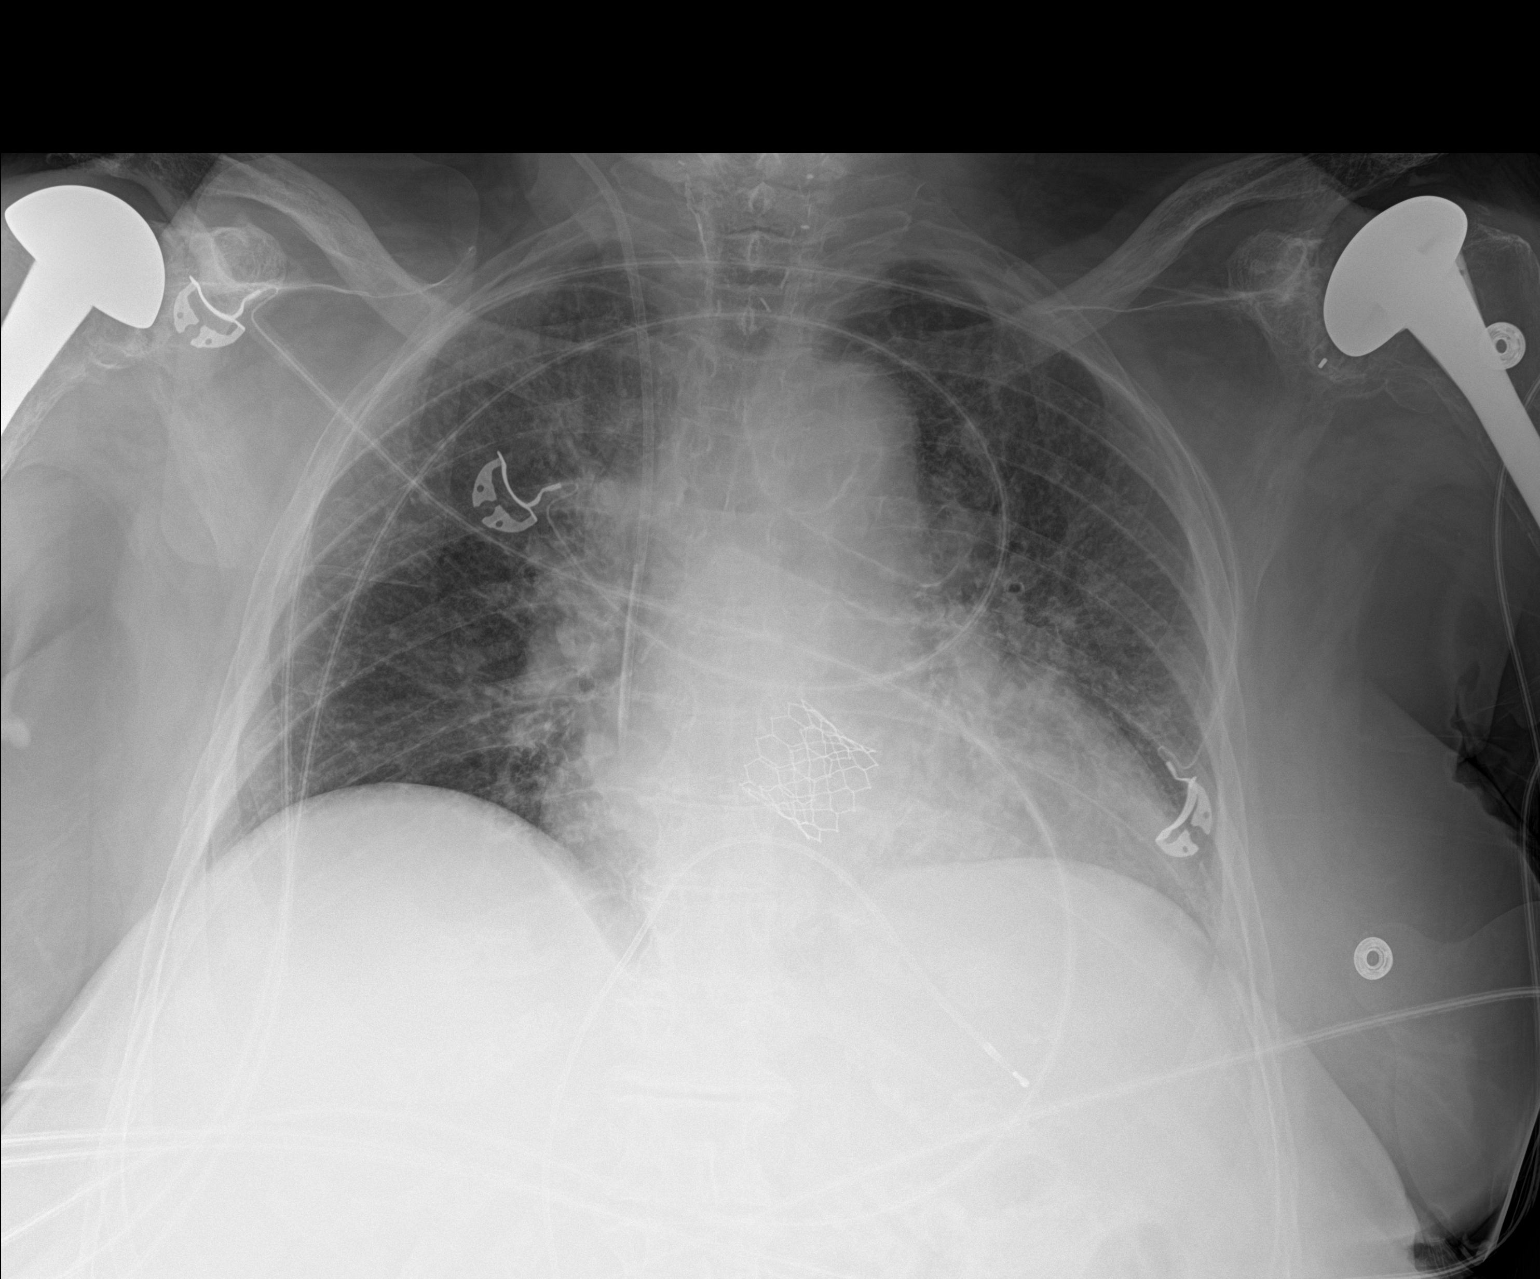

[1 of 1 positions shown; findings below may reference images not displayed]

FINDINGS: Cardiac shadow is mildly prominent but accentuated by the portable
technique. Aortic calcifications are noted. Right jugular central
line and changes of prior TAVR are noted. Temporary pacing lead is
noted from the femoral approach. The lungs are hypo aerated without
focal infiltrate. Some suggestion of mild edema is noted on the
left. No pneumothorax is seen.
IMPRESSION: Postsurgical changes with suggestion of mild pulmonary edema.

## 2020-07-23 ENCOUNTER — Ambulatory Visit: Payer: Medicare Other

## 2020-07-24 ENCOUNTER — Ambulatory Visit: Payer: Medicare Other
